# Patient Record
Sex: Female | Born: 1940 | Race: White | Hispanic: No | Smoking: Former smoker
Health system: Southern US, Community
[De-identification: ages and names within clinical notes are randomized; demographics above are authoritative.]

## PROBLEM LIST (undated history)

## (undated) DIAGNOSIS — M1990 Unspecified osteoarthritis, unspecified site: Secondary | ICD-10-CM

## (undated) DIAGNOSIS — I10 Essential (primary) hypertension: Secondary | ICD-10-CM

## (undated) DIAGNOSIS — E039 Hypothyroidism, unspecified: Secondary | ICD-10-CM

## (undated) DIAGNOSIS — J449 Chronic obstructive pulmonary disease, unspecified: Secondary | ICD-10-CM

## (undated) DIAGNOSIS — F32A Depression, unspecified: Secondary | ICD-10-CM

## (undated) DIAGNOSIS — J209 Acute bronchitis, unspecified: Secondary | ICD-10-CM

## (undated) DIAGNOSIS — E785 Hyperlipidemia, unspecified: Secondary | ICD-10-CM

## (undated) DIAGNOSIS — Z9889 Other specified postprocedural states: Secondary | ICD-10-CM

## (undated) DIAGNOSIS — M797 Fibromyalgia: Secondary | ICD-10-CM

## (undated) DIAGNOSIS — T8859XA Other complications of anesthesia, initial encounter: Secondary | ICD-10-CM

## (undated) DIAGNOSIS — K219 Gastro-esophageal reflux disease without esophagitis: Secondary | ICD-10-CM

## (undated) DIAGNOSIS — F329 Major depressive disorder, single episode, unspecified: Secondary | ICD-10-CM

## (undated) DIAGNOSIS — I8390 Asymptomatic varicose veins of unspecified lower extremity: Secondary | ICD-10-CM

## (undated) DIAGNOSIS — R112 Nausea with vomiting, unspecified: Secondary | ICD-10-CM

## (undated) DIAGNOSIS — H9190 Unspecified hearing loss, unspecified ear: Secondary | ICD-10-CM

## (undated) DIAGNOSIS — T4145XA Adverse effect of unspecified anesthetic, initial encounter: Secondary | ICD-10-CM

## (undated) HISTORY — DX: Acute bronchitis, unspecified: J20.9

## (undated) HISTORY — DX: Essential (primary) hypertension: I10

## (undated) HISTORY — DX: Gastro-esophageal reflux disease without esophagitis: K21.9

## (undated) HISTORY — PX: APPENDECTOMY: SHX54

## (undated) HISTORY — DX: Major depressive disorder, single episode, unspecified: F32.9

## (undated) HISTORY — DX: Hyperlipidemia, unspecified: E78.5

## (undated) HISTORY — DX: Depression, unspecified: F32.A

## (undated) HISTORY — PX: VESICOVAGINAL FISTULA CLOSURE W/ TAH: SUR271

## (undated) HISTORY — DX: Fibromyalgia: M79.7

## (undated) HISTORY — PX: NECK SURGERY: SHX720

## (undated) HISTORY — DX: Asymptomatic varicose veins of unspecified lower extremity: I83.90

## (undated) HISTORY — PX: ABDOMINAL HYSTERECTOMY: SHX81

---

## 1978-02-20 HISTORY — PX: BREAST SURGERY: SHX581

## 1978-02-20 HISTORY — PX: MASTECTOMY: SHX3

## 1989-02-20 HISTORY — PX: ROTATOR CUFF REPAIR: SHX139

## 1998-01-27 ENCOUNTER — Encounter: Admission: RE | Admit: 1998-01-27 | Discharge: 1998-01-27 | Payer: Self-pay | Admitting: Internal Medicine

## 1998-05-28 ENCOUNTER — Encounter: Admission: RE | Admit: 1998-05-28 | Discharge: 1998-05-28 | Payer: Self-pay | Admitting: Internal Medicine

## 2000-06-05 ENCOUNTER — Ambulatory Visit (HOSPITAL_COMMUNITY): Admission: RE | Admit: 2000-06-05 | Discharge: 2000-06-05 | Payer: Self-pay | Admitting: Pulmonary Disease

## 2000-10-17 ENCOUNTER — Encounter (HOSPITAL_COMMUNITY): Admission: RE | Admit: 2000-10-17 | Discharge: 2000-11-16 | Payer: Self-pay | Admitting: Orthopedic Surgery

## 2000-12-12 ENCOUNTER — Ambulatory Visit (HOSPITAL_COMMUNITY): Admission: RE | Admit: 2000-12-12 | Discharge: 2000-12-12 | Payer: Self-pay | Admitting: Unknown Physician Specialty

## 2000-12-12 ENCOUNTER — Encounter: Payer: Self-pay | Admitting: Unknown Physician Specialty

## 2000-12-21 ENCOUNTER — Ambulatory Visit (HOSPITAL_COMMUNITY): Admission: RE | Admit: 2000-12-21 | Discharge: 2000-12-21 | Payer: Self-pay | Admitting: Orthopedic Surgery

## 2001-02-26 ENCOUNTER — Encounter (HOSPITAL_COMMUNITY): Admission: RE | Admit: 2001-02-26 | Discharge: 2001-03-28 | Payer: Self-pay | Admitting: Orthopedic Surgery

## 2001-04-10 ENCOUNTER — Encounter (HOSPITAL_COMMUNITY): Admission: RE | Admit: 2001-04-10 | Discharge: 2001-05-10 | Payer: Self-pay | Admitting: Orthopedic Surgery

## 2001-05-13 ENCOUNTER — Encounter (HOSPITAL_COMMUNITY): Admission: RE | Admit: 2001-05-13 | Discharge: 2001-06-12 | Payer: Self-pay | Admitting: Orthopedic Surgery

## 2001-06-03 ENCOUNTER — Ambulatory Visit (HOSPITAL_COMMUNITY): Admission: RE | Admit: 2001-06-03 | Discharge: 2001-06-03 | Payer: Self-pay | Admitting: Orthopedic Surgery

## 2001-06-03 ENCOUNTER — Encounter: Payer: Self-pay | Admitting: Orthopedic Surgery

## 2001-10-02 ENCOUNTER — Encounter: Payer: Self-pay | Admitting: Internal Medicine

## 2001-10-02 ENCOUNTER — Ambulatory Visit (HOSPITAL_COMMUNITY): Admission: RE | Admit: 2001-10-02 | Discharge: 2001-10-02 | Payer: Self-pay | Admitting: Internal Medicine

## 2001-11-18 ENCOUNTER — Encounter: Payer: Self-pay | Admitting: Unknown Physician Specialty

## 2001-11-18 ENCOUNTER — Ambulatory Visit (HOSPITAL_COMMUNITY): Admission: RE | Admit: 2001-11-18 | Discharge: 2001-11-18 | Payer: Self-pay | Admitting: Unknown Physician Specialty

## 2002-01-15 ENCOUNTER — Encounter: Payer: Self-pay | Admitting: Unknown Physician Specialty

## 2002-01-15 ENCOUNTER — Ambulatory Visit (HOSPITAL_COMMUNITY): Admission: RE | Admit: 2002-01-15 | Discharge: 2002-01-15 | Payer: Self-pay | Admitting: Unknown Physician Specialty

## 2002-05-16 ENCOUNTER — Emergency Department (HOSPITAL_COMMUNITY): Admission: EM | Admit: 2002-05-16 | Discharge: 2002-05-17 | Payer: Self-pay | Admitting: *Deleted

## 2002-05-17 ENCOUNTER — Encounter: Payer: Self-pay | Admitting: *Deleted

## 2002-09-09 ENCOUNTER — Ambulatory Visit (HOSPITAL_COMMUNITY): Admission: RE | Admit: 2002-09-09 | Discharge: 2002-09-09 | Payer: Self-pay | Admitting: Internal Medicine

## 2003-02-05 ENCOUNTER — Emergency Department (HOSPITAL_COMMUNITY): Admission: EM | Admit: 2003-02-05 | Discharge: 2003-02-05 | Payer: Self-pay | Admitting: Emergency Medicine

## 2003-06-10 ENCOUNTER — Ambulatory Visit (HOSPITAL_COMMUNITY): Admission: RE | Admit: 2003-06-10 | Discharge: 2003-06-10 | Payer: Self-pay | Admitting: Unknown Physician Specialty

## 2003-06-19 ENCOUNTER — Emergency Department (HOSPITAL_COMMUNITY): Admission: EM | Admit: 2003-06-19 | Discharge: 2003-06-19 | Payer: Self-pay | Admitting: Emergency Medicine

## 2003-07-06 ENCOUNTER — Emergency Department (HOSPITAL_COMMUNITY): Admission: EM | Admit: 2003-07-06 | Discharge: 2003-07-06 | Payer: Self-pay | Admitting: Emergency Medicine

## 2003-07-21 ENCOUNTER — Ambulatory Visit (HOSPITAL_COMMUNITY): Admission: RE | Admit: 2003-07-21 | Discharge: 2003-07-21 | Payer: Self-pay | Admitting: Family Medicine

## 2003-07-27 ENCOUNTER — Ambulatory Visit (HOSPITAL_COMMUNITY): Admission: RE | Admit: 2003-07-27 | Discharge: 2003-07-27 | Payer: Self-pay | Admitting: Family Medicine

## 2003-08-21 ENCOUNTER — Ambulatory Visit (HOSPITAL_COMMUNITY): Admission: RE | Admit: 2003-08-21 | Discharge: 2003-08-21 | Payer: Self-pay | Admitting: Family Medicine

## 2003-08-26 ENCOUNTER — Ambulatory Visit (HOSPITAL_COMMUNITY): Admission: RE | Admit: 2003-08-26 | Discharge: 2003-08-26 | Payer: Self-pay | Admitting: Family Medicine

## 2003-09-15 ENCOUNTER — Ambulatory Visit (HOSPITAL_COMMUNITY): Admission: RE | Admit: 2003-09-15 | Discharge: 2003-09-15 | Payer: Self-pay | Admitting: Family Medicine

## 2003-11-30 ENCOUNTER — Ambulatory Visit (HOSPITAL_COMMUNITY): Admission: RE | Admit: 2003-11-30 | Discharge: 2003-11-30 | Payer: Self-pay | Admitting: Internal Medicine

## 2003-11-30 HISTORY — PX: ESOPHAGOGASTRODUODENOSCOPY: SHX1529

## 2003-12-14 ENCOUNTER — Ambulatory Visit (HOSPITAL_COMMUNITY): Admission: RE | Admit: 2003-12-14 | Discharge: 2003-12-14 | Payer: Self-pay | Admitting: Family Medicine

## 2003-12-28 ENCOUNTER — Ambulatory Visit: Payer: Self-pay | Admitting: Urgent Care

## 2004-01-18 ENCOUNTER — Ambulatory Visit: Payer: Self-pay | Admitting: Family Medicine

## 2004-02-08 ENCOUNTER — Ambulatory Visit: Payer: Self-pay | Admitting: Family Medicine

## 2004-03-14 ENCOUNTER — Ambulatory Visit: Payer: Self-pay | Admitting: Family Medicine

## 2004-07-12 ENCOUNTER — Ambulatory Visit: Payer: Self-pay | Admitting: Family Medicine

## 2004-08-24 ENCOUNTER — Ambulatory Visit: Payer: Self-pay | Admitting: Family Medicine

## 2004-10-11 ENCOUNTER — Ambulatory Visit: Payer: Self-pay | Admitting: Family Medicine

## 2004-10-12 ENCOUNTER — Ambulatory Visit (HOSPITAL_COMMUNITY): Admission: RE | Admit: 2004-10-12 | Discharge: 2004-10-12 | Payer: Self-pay | Admitting: Family Medicine

## 2004-11-25 ENCOUNTER — Ambulatory Visit: Payer: Self-pay | Admitting: Family Medicine

## 2004-12-06 ENCOUNTER — Ambulatory Visit: Payer: Self-pay | Admitting: Family Medicine

## 2004-12-12 ENCOUNTER — Ambulatory Visit (HOSPITAL_COMMUNITY): Admission: RE | Admit: 2004-12-12 | Discharge: 2004-12-12 | Payer: Self-pay | Admitting: Family Medicine

## 2005-01-24 ENCOUNTER — Ambulatory Visit: Payer: Self-pay | Admitting: Family Medicine

## 2005-02-14 ENCOUNTER — Ambulatory Visit: Payer: Self-pay | Admitting: Family Medicine

## 2005-03-01 ENCOUNTER — Ambulatory Visit: Payer: Self-pay | Admitting: Internal Medicine

## 2005-03-03 ENCOUNTER — Ambulatory Visit (HOSPITAL_COMMUNITY): Admission: RE | Admit: 2005-03-03 | Discharge: 2005-03-03 | Payer: Self-pay | Admitting: Internal Medicine

## 2005-04-17 ENCOUNTER — Ambulatory Visit: Payer: Self-pay | Admitting: Internal Medicine

## 2005-05-01 ENCOUNTER — Ambulatory Visit: Payer: Self-pay | Admitting: Family Medicine

## 2005-06-13 ENCOUNTER — Ambulatory Visit: Payer: Self-pay | Admitting: Family Medicine

## 2005-06-27 ENCOUNTER — Ambulatory Visit (HOSPITAL_COMMUNITY): Admission: RE | Admit: 2005-06-27 | Discharge: 2005-06-27 | Payer: Self-pay | Admitting: Family Medicine

## 2005-06-27 ENCOUNTER — Ambulatory Visit: Payer: Self-pay | Admitting: Family Medicine

## 2005-07-25 ENCOUNTER — Other Ambulatory Visit: Admission: RE | Admit: 2005-07-25 | Discharge: 2005-07-25 | Payer: Self-pay | Admitting: Family Medicine

## 2005-07-25 ENCOUNTER — Encounter: Payer: Self-pay | Admitting: Family Medicine

## 2005-07-25 ENCOUNTER — Ambulatory Visit: Payer: Self-pay | Admitting: Family Medicine

## 2005-07-25 LAB — CONVERTED CEMR LAB: Pap Smear: NORMAL

## 2005-07-26 ENCOUNTER — Ambulatory Visit (HOSPITAL_COMMUNITY): Admission: RE | Admit: 2005-07-26 | Discharge: 2005-07-26 | Payer: Self-pay | Admitting: Family Medicine

## 2005-08-07 ENCOUNTER — Encounter (HOSPITAL_COMMUNITY): Admission: RE | Admit: 2005-08-07 | Discharge: 2005-09-06 | Payer: Self-pay | Admitting: Family Medicine

## 2005-09-19 ENCOUNTER — Ambulatory Visit: Payer: Self-pay | Admitting: Family Medicine

## 2005-09-25 ENCOUNTER — Ambulatory Visit (HOSPITAL_COMMUNITY): Admission: RE | Admit: 2005-09-25 | Discharge: 2005-09-25 | Payer: Self-pay | Admitting: Family Medicine

## 2005-10-09 ENCOUNTER — Ambulatory Visit: Payer: Self-pay | Admitting: Internal Medicine

## 2005-10-25 ENCOUNTER — Ambulatory Visit: Payer: Self-pay | Admitting: Family Medicine

## 2005-11-14 ENCOUNTER — Ambulatory Visit (HOSPITAL_COMMUNITY): Admission: RE | Admit: 2005-11-14 | Discharge: 2005-11-14 | Payer: Self-pay | Admitting: Family Medicine

## 2005-11-14 ENCOUNTER — Ambulatory Visit: Payer: Self-pay | Admitting: Family Medicine

## 2005-11-27 ENCOUNTER — Ambulatory Visit: Payer: Self-pay | Admitting: Internal Medicine

## 2005-12-04 ENCOUNTER — Ambulatory Visit: Payer: Self-pay | Admitting: Family Medicine

## 2005-12-06 ENCOUNTER — Ambulatory Visit (HOSPITAL_COMMUNITY): Admission: RE | Admit: 2005-12-06 | Discharge: 2005-12-06 | Payer: Self-pay | Admitting: Family Medicine

## 2006-03-06 ENCOUNTER — Encounter: Payer: Self-pay | Admitting: Family Medicine

## 2006-03-06 DIAGNOSIS — J309 Allergic rhinitis, unspecified: Secondary | ICD-10-CM | POA: Insufficient documentation

## 2006-03-06 DIAGNOSIS — J209 Acute bronchitis, unspecified: Secondary | ICD-10-CM | POA: Insufficient documentation

## 2006-03-06 DIAGNOSIS — E782 Mixed hyperlipidemia: Secondary | ICD-10-CM | POA: Insufficient documentation

## 2006-03-06 DIAGNOSIS — F329 Major depressive disorder, single episode, unspecified: Secondary | ICD-10-CM | POA: Insufficient documentation

## 2006-03-06 DIAGNOSIS — E785 Hyperlipidemia, unspecified: Secondary | ICD-10-CM | POA: Insufficient documentation

## 2006-03-06 DIAGNOSIS — I10 Essential (primary) hypertension: Secondary | ICD-10-CM | POA: Insufficient documentation

## 2006-03-06 DIAGNOSIS — H8109 Meniere's disease, unspecified ear: Secondary | ICD-10-CM | POA: Insufficient documentation

## 2006-03-06 DIAGNOSIS — IMO0001 Reserved for inherently not codable concepts without codable children: Secondary | ICD-10-CM | POA: Insufficient documentation

## 2006-03-06 DIAGNOSIS — K5900 Constipation, unspecified: Secondary | ICD-10-CM | POA: Insufficient documentation

## 2006-03-06 DIAGNOSIS — M810 Age-related osteoporosis without current pathological fracture: Secondary | ICD-10-CM | POA: Insufficient documentation

## 2006-03-06 DIAGNOSIS — K219 Gastro-esophageal reflux disease without esophagitis: Secondary | ICD-10-CM | POA: Insufficient documentation

## 2006-03-28 ENCOUNTER — Ambulatory Visit: Payer: Self-pay | Admitting: Family Medicine

## 2006-04-02 ENCOUNTER — Ambulatory Visit: Payer: Self-pay | Admitting: Family Medicine

## 2006-04-09 ENCOUNTER — Ambulatory Visit: Payer: Self-pay | Admitting: Family Medicine

## 2006-04-30 ENCOUNTER — Encounter: Payer: Self-pay | Admitting: Family Medicine

## 2006-04-30 LAB — CONVERTED CEMR LAB
ALT: 21 units/L (ref 0–35)
AST: 21 units/L (ref 0–37)
Albumin: 3.9 g/dL (ref 3.5–5.2)
Alkaline Phosphatase: 61 units/L (ref 39–117)
Bilirubin, Direct: 0.1 mg/dL (ref 0.0–0.3)
Cholesterol: 212 mg/dL — ABNORMAL HIGH (ref 0–200)
HDL: 54 mg/dL (ref >39)
Indirect Bilirubin: 0.3 mg/dL (ref 0.0–0.9)
LDL Cholesterol: 137 mg/dL — ABNORMAL HIGH (ref 0–99)
Total Bilirubin: 0.4 mg/dL (ref 0.3–1.2)
Total CHOL/HDL Ratio: 3.9
Total Protein: 7.2 g/dL (ref 6.0–8.3)
Triglycerides: 105 mg/dL (ref <150)
VLDL: 21 mg/dL (ref 0–40)

## 2006-05-07 ENCOUNTER — Ambulatory Visit: Payer: Self-pay | Admitting: Family Medicine

## 2006-05-28 ENCOUNTER — Encounter: Admission: RE | Admit: 2006-05-28 | Discharge: 2006-05-28 | Payer: Self-pay | Admitting: Family Medicine

## 2006-09-10 ENCOUNTER — Ambulatory Visit: Payer: Self-pay | Admitting: Family Medicine

## 2006-09-10 LAB — CONVERTED CEMR LAB
ALT: 33 units/L (ref 0–35)
AST: 29 units/L (ref 0–37)
Albumin: 4.4 g/dL (ref 3.5–5.2)
Alkaline Phosphatase: 77 units/L (ref 39–117)
BUN: 14 mg/dL (ref 6–23)
Bilirubin, Direct: 0.1 mg/dL (ref 0.0–0.3)
CO2: 24 meq/L (ref 19–32)
Calcium: 9.3 mg/dL (ref 8.4–10.5)
Chloride: 104 meq/L (ref 96–112)
Creatinine, Ser: 0.77 mg/dL (ref 0.40–1.20)
Glucose, Bld: 99 mg/dL (ref 70–99)
Helicobacter Pylori Antibody-IgG: 1 — ABNORMAL HIGH
Indirect Bilirubin: 0.3 mg/dL (ref 0.0–0.9)
Potassium: 3.6 meq/L (ref 3.5–5.3)
Sodium: 143 meq/L (ref 135–145)
Total Bilirubin: 0.4 mg/dL (ref 0.3–1.2)
Total Protein: 7.7 g/dL (ref 6.0–8.3)

## 2006-09-13 ENCOUNTER — Ambulatory Visit (HOSPITAL_COMMUNITY): Admission: RE | Admit: 2006-09-13 | Discharge: 2006-09-13 | Payer: Self-pay | Admitting: Family Medicine

## 2006-09-25 ENCOUNTER — Encounter (HOSPITAL_COMMUNITY): Admission: RE | Admit: 2006-09-25 | Discharge: 2006-10-25 | Payer: Self-pay | Admitting: Family Medicine

## 2006-10-31 ENCOUNTER — Ambulatory Visit: Payer: Self-pay | Admitting: Family Medicine

## 2006-12-19 ENCOUNTER — Encounter: Payer: Self-pay | Admitting: Family Medicine

## 2006-12-19 LAB — CONVERTED CEMR LAB
ALT: 22 units/L (ref 0–35)
AST: 24 units/L (ref 0–37)
Albumin: 4.3 g/dL (ref 3.5–5.2)
Alkaline Phosphatase: 58 units/L (ref 39–117)
BUN: 18 mg/dL (ref 6–23)
Basophils Absolute: 0 10*3/uL (ref 0.0–0.1)
Basophils Relative: 0 % (ref 0–1)
Bilirubin, Direct: 0.1 mg/dL (ref 0.0–0.3)
CO2: 26 meq/L (ref 19–32)
Calcium: 9.7 mg/dL (ref 8.4–10.5)
Chloride: 102 meq/L (ref 96–112)
Cholesterol: 232 mg/dL — ABNORMAL HIGH (ref 0–200)
Creatinine, Ser: 0.87 mg/dL (ref 0.40–1.20)
Eosinophils Absolute: 0.1 10*3/uL (ref 0.0–0.7)
Eosinophils Relative: 1 % (ref 0–5)
Glucose, Bld: 103 mg/dL — ABNORMAL HIGH (ref 70–99)
HCT: 42.6 % (ref 36.0–46.0)
HDL: 47 mg/dL (ref >39)
Hemoglobin: 13.9 g/dL (ref 12.0–15.0)
Indirect Bilirubin: 0.4 mg/dL (ref 0.0–0.9)
LDL Cholesterol: 150 mg/dL — ABNORMAL HIGH (ref 0–99)
Lymphocytes Relative: 43 % (ref 12–46)
Lymphs Abs: 3.2 10*3/uL (ref 0.7–3.3)
MCHC: 32.6 g/dL (ref 30.0–36.0)
MCV: 92.2 fL (ref 78.0–100.0)
Monocytes Absolute: 0.7 10*3/uL (ref 0.2–0.7)
Monocytes Relative: 9 % (ref 3–11)
Neutro Abs: 3.4 10*3/uL (ref 1.7–7.7)
Neutrophils Relative %: 47 % (ref 43–77)
Platelets: 238 10*3/uL (ref 150–400)
Potassium: 4.1 meq/L (ref 3.5–5.3)
RBC: 4.62 M/uL (ref 3.87–5.11)
RDW: 13.8 % (ref 11.5–14.0)
Sodium: 138 meq/L (ref 135–145)
Total Bilirubin: 0.5 mg/dL (ref 0.3–1.2)
Total CHOL/HDL Ratio: 4.9
Total Protein: 7.6 g/dL (ref 6.0–8.3)
Triglycerides: 173 mg/dL — ABNORMAL HIGH (ref <150)
VLDL: 35 mg/dL (ref 0–40)
WBC: 7.3 10*3/uL (ref 4.0–10.5)

## 2007-01-02 ENCOUNTER — Ambulatory Visit: Payer: Self-pay | Admitting: Family Medicine

## 2007-02-08 ENCOUNTER — Encounter: Payer: Self-pay | Admitting: Family Medicine

## 2007-02-25 ENCOUNTER — Ambulatory Visit: Payer: Self-pay | Admitting: Family Medicine

## 2007-03-01 ENCOUNTER — Encounter: Payer: Self-pay | Admitting: Family Medicine

## 2007-04-20 ENCOUNTER — Encounter: Payer: Self-pay | Admitting: Family Medicine

## 2007-04-20 LAB — CONVERTED CEMR LAB
ALT: 36 units/L — ABNORMAL HIGH (ref 0–35)
AST: 30 units/L (ref 0–37)
Albumin: 4.1 g/dL (ref 3.5–5.2)
Alkaline Phosphatase: 61 units/L (ref 39–117)
Bilirubin, Direct: 0.1 mg/dL (ref 0.0–0.3)
Cholesterol: 175 mg/dL (ref 0–200)
HDL: 52 mg/dL (ref >39)
Indirect Bilirubin: 0.3 mg/dL (ref 0.0–0.9)
LDL Cholesterol: 100 mg/dL — ABNORMAL HIGH (ref 0–99)
Total Bilirubin: 0.4 mg/dL (ref 0.3–1.2)
Total CHOL/HDL Ratio: 3.4
Total Protein: 7.5 g/dL (ref 6.0–8.3)
Triglycerides: 114 mg/dL (ref <150)
VLDL: 23 mg/dL (ref 0–40)

## 2007-04-24 ENCOUNTER — Ambulatory Visit: Payer: Self-pay | Admitting: Family Medicine

## 2007-06-09 ENCOUNTER — Emergency Department (HOSPITAL_COMMUNITY): Admission: EM | Admit: 2007-06-09 | Discharge: 2007-06-09 | Payer: Self-pay | Admitting: Emergency Medicine

## 2007-06-14 ENCOUNTER — Emergency Department (HOSPITAL_COMMUNITY): Admission: EM | Admit: 2007-06-14 | Discharge: 2007-06-14 | Payer: Self-pay | Admitting: Emergency Medicine

## 2007-07-04 ENCOUNTER — Ambulatory Visit (HOSPITAL_COMMUNITY): Admission: RE | Admit: 2007-07-04 | Discharge: 2007-07-04 | Payer: Self-pay | Admitting: Family Medicine

## 2007-07-30 ENCOUNTER — Encounter: Payer: Self-pay | Admitting: Family Medicine

## 2007-07-30 ENCOUNTER — Ambulatory Visit: Payer: Self-pay | Admitting: Family Medicine

## 2007-09-24 ENCOUNTER — Encounter: Payer: Self-pay | Admitting: Family Medicine

## 2007-10-30 ENCOUNTER — Encounter: Payer: Self-pay | Admitting: Family Medicine

## 2007-10-30 LAB — CONVERTED CEMR LAB
ALT: 29 units/L (ref 0–35)
AST: 27 units/L (ref 0–37)
Albumin: 4.1 g/dL (ref 3.5–5.2)
Alkaline Phosphatase: 65 units/L (ref 39–117)
BUN: 15 mg/dL (ref 6–23)
Bilirubin, Direct: <0.1 mg/dL (ref 0.0–0.3)
CO2: 24 meq/L (ref 19–32)
Calcium: 9.6 mg/dL (ref 8.4–10.5)
Chloride: 104 meq/L (ref 96–112)
Cholesterol: 244 mg/dL — ABNORMAL HIGH (ref 0–200)
Creatinine, Ser: 0.76 mg/dL (ref 0.40–1.20)
Glucose, Bld: 86 mg/dL (ref 70–99)
HDL: 40 mg/dL (ref >39)
Potassium: 4.1 meq/L (ref 3.5–5.3)
Sodium: 141 meq/L (ref 135–145)
Total Bilirubin: 0.4 mg/dL (ref 0.3–1.2)
Total CHOL/HDL Ratio: 6.1
Total Protein: 7.6 g/dL (ref 6.0–8.3)
Triglycerides: 462 mg/dL — ABNORMAL HIGH (ref <150)

## 2007-10-31 ENCOUNTER — Ambulatory Visit: Payer: Self-pay | Admitting: Family Medicine

## 2007-10-31 LAB — CONVERTED CEMR LAB: Direct LDL: 141 mg/dL — ABNORMAL HIGH

## 2007-11-15 ENCOUNTER — Encounter: Payer: Self-pay | Admitting: Family Medicine

## 2008-01-06 ENCOUNTER — Encounter: Payer: Self-pay | Admitting: Family Medicine

## 2008-01-30 ENCOUNTER — Encounter: Payer: Self-pay | Admitting: Family Medicine

## 2008-01-30 LAB — CONVERTED CEMR LAB
ALT: 27 units/L (ref 0–35)
AST: 27 units/L (ref 0–37)
Albumin: 4.3 g/dL (ref 3.5–5.2)
Alkaline Phosphatase: 68 units/L (ref 39–117)
Bilirubin, Direct: 0.1 mg/dL (ref 0.0–0.3)
Cholesterol: 204 mg/dL — ABNORMAL HIGH (ref 0–200)
HDL: 52 mg/dL (ref >39)
Indirect Bilirubin: 0.2 mg/dL (ref 0.0–0.9)
LDL Cholesterol: 104 mg/dL — ABNORMAL HIGH (ref 0–99)
Total Bilirubin: 0.3 mg/dL (ref 0.3–1.2)
Total CHOL/HDL Ratio: 3.9
Total Protein: 7.5 g/dL (ref 6.0–8.3)
Triglycerides: 241 mg/dL — ABNORMAL HIGH (ref <150)
VLDL: 48 mg/dL — ABNORMAL HIGH (ref 0–40)

## 2008-02-05 ENCOUNTER — Encounter: Payer: Self-pay | Admitting: Family Medicine

## 2008-02-05 ENCOUNTER — Other Ambulatory Visit: Admission: RE | Admit: 2008-02-05 | Discharge: 2008-02-05 | Payer: Self-pay | Admitting: Family Medicine

## 2008-02-05 ENCOUNTER — Ambulatory Visit: Payer: Self-pay | Admitting: Family Medicine

## 2008-02-05 DIAGNOSIS — R5383 Other fatigue: Secondary | ICD-10-CM | POA: Insufficient documentation

## 2008-02-05 DIAGNOSIS — M25559 Pain in unspecified hip: Secondary | ICD-10-CM | POA: Insufficient documentation

## 2008-02-05 DIAGNOSIS — N3000 Acute cystitis without hematuria: Secondary | ICD-10-CM | POA: Insufficient documentation

## 2008-02-05 HISTORY — DX: Other fatigue: R53.83

## 2008-02-05 LAB — CONVERTED CEMR LAB
Bilirubin Urine: NEGATIVE
Blood in Urine, dipstick: NEGATIVE
Glucose, Urine, Semiquant: NEGATIVE
Nitrite: NEGATIVE
OCCULT 1: NEGATIVE
Protein, U semiquant: 30
Specific Gravity, Urine: >=1.03
Urobilinogen, UA: 0.2
WBC Urine, dipstick: NEGATIVE
pH: 5.5

## 2008-02-07 DIAGNOSIS — N644 Mastodynia: Secondary | ICD-10-CM | POA: Insufficient documentation

## 2008-02-21 HISTORY — PX: OTHER SURGICAL HISTORY: SHX169

## 2008-03-02 ENCOUNTER — Telehealth: Payer: Self-pay | Admitting: Family Medicine

## 2008-03-03 ENCOUNTER — Ambulatory Visit: Payer: Self-pay | Admitting: Family Medicine

## 2008-03-03 ENCOUNTER — Telehealth: Payer: Self-pay | Admitting: Family Medicine

## 2008-03-12 ENCOUNTER — Encounter: Payer: Self-pay | Admitting: Family Medicine

## 2008-03-23 ENCOUNTER — Encounter: Payer: Self-pay | Admitting: Family Medicine

## 2008-03-24 ENCOUNTER — Ambulatory Visit: Payer: Self-pay | Admitting: Family Medicine

## 2008-03-24 DIAGNOSIS — R1900 Intra-abdominal and pelvic swelling, mass and lump, unspecified site: Secondary | ICD-10-CM | POA: Insufficient documentation

## 2008-03-24 DIAGNOSIS — J0100 Acute maxillary sinusitis, unspecified: Secondary | ICD-10-CM | POA: Insufficient documentation

## 2008-03-24 DIAGNOSIS — M5442 Lumbago with sciatica, left side: Secondary | ICD-10-CM | POA: Insufficient documentation

## 2008-03-24 DIAGNOSIS — M5431 Sciatica, right side: Secondary | ICD-10-CM

## 2008-03-25 LAB — CONVERTED CEMR LAB
Basophils Absolute: 0 10*3/uL (ref 0.0–0.1)
Basophils Relative: 0 % (ref 0–1)
Eosinophils Absolute: 0.1 10*3/uL (ref 0.0–0.7)
Eosinophils Relative: 1 % (ref 0–5)
HCT: 39.4 % (ref 36.0–46.0)
Hemoglobin: 13 g/dL (ref 12.0–15.0)
Lymphocytes Relative: 25 % (ref 12–46)
Lymphs Abs: 2.6 10*3/uL (ref 0.7–4.0)
MCHC: 33 g/dL (ref 30.0–36.0)
MCV: 90 fL (ref 78.0–100.0)
Monocytes Absolute: 0.8 10*3/uL (ref 0.1–1.0)
Monocytes Relative: 7 % (ref 3–12)
Neutro Abs: 7 10*3/uL (ref 1.7–7.7)
Neutrophils Relative %: 67 % (ref 43–77)
Platelets: 275 10*3/uL (ref 150–400)
RBC: 4.38 M/uL (ref 3.87–5.11)
RDW: 13.7 % (ref 11.5–15.5)
TSH: 2.357 microintl units/mL (ref 0.350–4.50)
WBC: 10.5 10*3/uL (ref 4.0–10.5)

## 2008-03-26 ENCOUNTER — Ambulatory Visit (HOSPITAL_COMMUNITY): Admission: RE | Admit: 2008-03-26 | Discharge: 2008-03-26 | Payer: Self-pay | Admitting: Family Medicine

## 2008-03-26 ENCOUNTER — Encounter: Payer: Self-pay | Admitting: Family Medicine

## 2008-03-27 ENCOUNTER — Ambulatory Visit (HOSPITAL_COMMUNITY): Admission: RE | Admit: 2008-03-27 | Discharge: 2008-03-27 | Payer: Self-pay | Admitting: Family Medicine

## 2008-03-28 LAB — CONVERTED CEMR LAB
Creatinine 24 HR UR: 285 mg/24hr — ABNORMAL LOW (ref 700–1800)
Creatinine, Urine: 142.7 mg/dL

## 2008-03-31 ENCOUNTER — Telehealth: Payer: Self-pay | Admitting: Family Medicine

## 2008-04-08 ENCOUNTER — Telehealth: Payer: Self-pay | Admitting: Orthopedic Surgery

## 2008-04-13 ENCOUNTER — Telehealth: Payer: Self-pay | Admitting: Family Medicine

## 2008-04-14 ENCOUNTER — Telehealth: Payer: Self-pay | Admitting: Family Medicine

## 2008-04-17 ENCOUNTER — Encounter: Payer: Self-pay | Admitting: Family Medicine

## 2008-04-24 ENCOUNTER — Encounter: Payer: Self-pay | Admitting: Family Medicine

## 2008-05-13 ENCOUNTER — Ambulatory Visit: Payer: Self-pay | Admitting: Family Medicine

## 2008-06-24 ENCOUNTER — Encounter: Payer: Self-pay | Admitting: Family Medicine

## 2008-07-31 ENCOUNTER — Ambulatory Visit (HOSPITAL_COMMUNITY): Admission: RE | Admit: 2008-07-31 | Discharge: 2008-07-31 | Payer: Self-pay | Admitting: Family Medicine

## 2008-07-31 ENCOUNTER — Ambulatory Visit: Payer: Self-pay | Admitting: Family Medicine

## 2008-07-31 DIAGNOSIS — M79609 Pain in unspecified limb: Secondary | ICD-10-CM | POA: Insufficient documentation

## 2008-08-03 LAB — CONVERTED CEMR LAB
ALT: 24 units/L (ref 0–35)
AST: 23 units/L (ref 0–37)
Albumin: 4.1 g/dL (ref 3.5–5.2)
Alkaline Phosphatase: 52 units/L (ref 39–117)
BUN: 15 mg/dL (ref 6–23)
Basophils Absolute: 0 10*3/uL (ref 0.0–0.1)
Basophils Relative: 0 % (ref 0–1)
Bilirubin, Direct: 0.1 mg/dL (ref 0.0–0.3)
CO2: 24 meq/L (ref 19–32)
Calcium: 9.2 mg/dL (ref 8.4–10.5)
Chloride: 104 meq/L (ref 96–112)
Cholesterol: 208 mg/dL — ABNORMAL HIGH (ref 0–200)
Creatinine, Ser: 0.88 mg/dL (ref 0.40–1.20)
Eosinophils Absolute: 0.1 10*3/uL (ref 0.0–0.7)
Eosinophils Relative: 1 % (ref 0–5)
Glucose, Bld: 108 mg/dL — ABNORMAL HIGH (ref 70–99)
HCT: 41.2 % (ref 36.0–46.0)
HDL: 50 mg/dL (ref >39)
Hemoglobin: 13.9 g/dL (ref 12.0–15.0)
Indirect Bilirubin: 0.2 mg/dL (ref 0.0–0.9)
LDL Cholesterol: 116 mg/dL — ABNORMAL HIGH (ref 0–99)
Lymphocytes Relative: 48 % — ABNORMAL HIGH (ref 12–46)
Lymphs Abs: 3.3 10*3/uL (ref 0.7–4.0)
MCHC: 33.7 g/dL (ref 30.0–36.0)
MCV: 93.2 fL (ref 78.0–100.0)
Monocytes Absolute: 0.7 10*3/uL (ref 0.1–1.0)
Monocytes Relative: 10 % (ref 3–12)
Neutro Abs: 2.8 10*3/uL (ref 1.7–7.7)
Neutrophils Relative %: 41 % — ABNORMAL LOW (ref 43–77)
Platelets: 270 10*3/uL (ref 150–400)
Potassium: 4.3 meq/L (ref 3.5–5.3)
RBC: 4.42 M/uL (ref 3.87–5.11)
RDW: 13.8 % (ref 11.5–15.5)
Sodium: 142 meq/L (ref 135–145)
Total Bilirubin: 0.3 mg/dL (ref 0.3–1.2)
Total CHOL/HDL Ratio: 4.2
Total Protein: 7.5 g/dL (ref 6.0–8.3)
Triglycerides: 209 mg/dL — ABNORMAL HIGH (ref <150)
VLDL: 42 mg/dL — ABNORMAL HIGH (ref 0–40)
Vitamin B-12: 271 pg/mL (ref 211–911)
WBC: 6.8 10*3/uL (ref 4.0–10.5)

## 2008-08-08 DIAGNOSIS — B009 Herpesviral infection, unspecified: Secondary | ICD-10-CM | POA: Insufficient documentation

## 2008-08-17 ENCOUNTER — Telehealth: Payer: Self-pay | Admitting: Family Medicine

## 2008-08-28 ENCOUNTER — Encounter: Payer: Self-pay | Admitting: Family Medicine

## 2008-09-11 ENCOUNTER — Encounter: Payer: Self-pay | Admitting: Family Medicine

## 2008-10-02 ENCOUNTER — Encounter: Payer: Self-pay | Admitting: Family Medicine

## 2008-11-19 ENCOUNTER — Ambulatory Visit: Payer: Self-pay | Admitting: Family Medicine

## 2008-11-19 LAB — CONVERTED CEMR LAB: Hgb A1c MFr Bld: 6.4 %

## 2008-11-22 DIAGNOSIS — E663 Overweight: Secondary | ICD-10-CM | POA: Insufficient documentation

## 2008-11-22 DIAGNOSIS — H6690 Otitis media, unspecified, unspecified ear: Secondary | ICD-10-CM | POA: Insufficient documentation

## 2008-11-22 DIAGNOSIS — E739 Lactose intolerance, unspecified: Secondary | ICD-10-CM | POA: Insufficient documentation

## 2008-11-23 ENCOUNTER — Encounter: Payer: Self-pay | Admitting: Family Medicine

## 2008-12-01 ENCOUNTER — Ambulatory Visit (HOSPITAL_COMMUNITY): Admission: RE | Admit: 2008-12-01 | Discharge: 2008-12-01 | Payer: Self-pay | Admitting: Family Medicine

## 2009-01-17 ENCOUNTER — Emergency Department (HOSPITAL_COMMUNITY): Admission: EM | Admit: 2009-01-17 | Discharge: 2009-01-17 | Payer: Self-pay | Admitting: Emergency Medicine

## 2009-02-20 HISTORY — PX: CATARACT EXTRACTION, BILATERAL: SHX1313

## 2009-05-03 ENCOUNTER — Telehealth: Payer: Self-pay | Admitting: Family Medicine

## 2009-05-06 ENCOUNTER — Ambulatory Visit: Payer: Self-pay | Admitting: Family Medicine

## 2009-05-06 DIAGNOSIS — M25512 Pain in left shoulder: Secondary | ICD-10-CM

## 2009-05-06 HISTORY — DX: Pain in left shoulder: M25.512

## 2009-05-26 ENCOUNTER — Telehealth: Payer: Self-pay | Admitting: Family Medicine

## 2009-07-05 ENCOUNTER — Encounter: Payer: Self-pay | Admitting: Family Medicine

## 2009-07-05 LAB — CONVERTED CEMR LAB
ALT: 21 units/L (ref 0–35)
AST: 22 units/L (ref 0–37)
Albumin: 4.2 g/dL (ref 3.5–5.2)
Alkaline Phosphatase: 56 units/L (ref 39–117)
BUN: 23 mg/dL (ref 6–23)
CO2: 25 meq/L (ref 19–32)
Calcium: 9.4 mg/dL (ref 8.4–10.5)
Chloride: 103 meq/L (ref 96–112)
Cholesterol: 220 mg/dL — ABNORMAL HIGH (ref 0–200)
Creatinine, Ser: 0.92 mg/dL (ref 0.40–1.20)
Glucose, Bld: 89 mg/dL (ref 70–99)
HCT: 42.5 % (ref 36.0–46.0)
HDL: 49 mg/dL (ref >39)
Hemoglobin: 13.5 g/dL (ref 12.0–15.0)
LDL Cholesterol: 128 mg/dL — ABNORMAL HIGH (ref 0–99)
MCHC: 31.8 g/dL (ref 30.0–36.0)
MCV: 95.1 fL (ref 78.0–100.0)
Platelets: 260 10*3/uL (ref 150–400)
Potassium: 4.4 meq/L (ref 3.5–5.3)
RBC: 4.47 M/uL (ref 3.87–5.11)
RDW: 14.3 % (ref 11.5–15.5)
Sodium: 138 meq/L (ref 135–145)
TSH: 4.187 microintl units/mL (ref 0.350–4.500)
Total Bilirubin: 0.4 mg/dL (ref 0.3–1.2)
Total CHOL/HDL Ratio: 4.5
Total Protein: 7 g/dL (ref 6.0–8.3)
Triglycerides: 217 mg/dL — ABNORMAL HIGH (ref <150)
VLDL: 43 mg/dL — ABNORMAL HIGH (ref 0–40)
Vit D, 25-Hydroxy: 37 ng/mL (ref 30–89)
WBC: 6.3 10*3/uL (ref 4.0–10.5)

## 2009-07-08 ENCOUNTER — Ambulatory Visit: Payer: Self-pay | Admitting: Family Medicine

## 2009-07-08 DIAGNOSIS — M549 Dorsalgia, unspecified: Secondary | ICD-10-CM | POA: Insufficient documentation

## 2009-07-08 DIAGNOSIS — H547 Unspecified visual loss: Secondary | ICD-10-CM | POA: Insufficient documentation

## 2009-08-05 ENCOUNTER — Ambulatory Visit (HOSPITAL_COMMUNITY): Admission: RE | Admit: 2009-08-05 | Discharge: 2009-08-05 | Payer: Self-pay | Admitting: Family Medicine

## 2009-08-16 ENCOUNTER — Telehealth: Payer: Self-pay | Admitting: Family Medicine

## 2009-10-22 ENCOUNTER — Telehealth: Payer: Self-pay | Admitting: Family Medicine

## 2009-10-22 LAB — CONVERTED CEMR LAB
ALT: 28 units/L (ref 0–35)
AST: 27 units/L (ref 0–37)
Albumin: 4.1 g/dL (ref 3.5–5.2)
Alkaline Phosphatase: 57 units/L (ref 39–117)
BUN: 11 mg/dL (ref 6–23)
Bilirubin, Direct: 0.1 mg/dL (ref 0.0–0.3)
CO2: 27 meq/L (ref 19–32)
Calcium: 9.2 mg/dL (ref 8.4–10.5)
Chloride: 105 meq/L (ref 96–112)
Cholesterol: 224 mg/dL — ABNORMAL HIGH (ref 0–200)
Creatinine, Ser: 0.75 mg/dL (ref 0.40–1.20)
Glucose, Bld: 103 mg/dL — ABNORMAL HIGH (ref 70–99)
HDL: 48 mg/dL (ref >39)
Indirect Bilirubin: 0.2 mg/dL (ref 0.0–0.9)
LDL Cholesterol: 139 mg/dL — ABNORMAL HIGH (ref 0–99)
Potassium: 4.2 meq/L (ref 3.5–5.3)
Sodium: 142 meq/L (ref 135–145)
TSH: 2.542 microintl units/mL (ref 0.350–4.500)
Total Bilirubin: 0.3 mg/dL (ref 0.3–1.2)
Total CHOL/HDL Ratio: 4.7
Total Protein: 6.9 g/dL (ref 6.0–8.3)
Triglycerides: 185 mg/dL — ABNORMAL HIGH (ref <150)
VLDL: 37 mg/dL (ref 0–40)
Vit D, 25-Hydroxy: 37 ng/mL (ref 30–89)

## 2009-10-26 ENCOUNTER — Ambulatory Visit: Payer: Self-pay | Admitting: Family Medicine

## 2009-10-26 DIAGNOSIS — R7303 Prediabetes: Secondary | ICD-10-CM | POA: Insufficient documentation

## 2009-10-26 LAB — CONVERTED CEMR LAB: OCCULT 1: NEGATIVE

## 2009-10-27 LAB — CONVERTED CEMR LAB: Hgb A1c MFr Bld: 6.2 % — ABNORMAL HIGH (ref <5.7)

## 2009-12-06 ENCOUNTER — Ambulatory Visit (HOSPITAL_COMMUNITY): Admission: RE | Admit: 2009-12-06 | Discharge: 2009-12-06 | Payer: Self-pay | Admitting: Family Medicine

## 2009-12-28 ENCOUNTER — Ambulatory Visit (HOSPITAL_COMMUNITY): Admission: RE | Admit: 2009-12-28 | Discharge: 2009-12-28 | Payer: Self-pay | Admitting: Ophthalmology

## 2010-01-11 ENCOUNTER — Ambulatory Visit (HOSPITAL_COMMUNITY): Admission: RE | Admit: 2010-01-11 | Discharge: 2010-01-11 | Payer: Self-pay | Admitting: Ophthalmology

## 2010-02-02 ENCOUNTER — Telehealth: Payer: Self-pay | Admitting: Family Medicine

## 2010-02-03 ENCOUNTER — Encounter: Payer: Self-pay | Admitting: Family Medicine

## 2010-02-22 LAB — HM PAP SMEAR

## 2010-02-22 LAB — HM MAMMOGRAPHY

## 2010-03-10 ENCOUNTER — Ambulatory Visit: Admit: 2010-03-10 | Payer: Self-pay | Admitting: Orthopedic Surgery

## 2010-03-12 ENCOUNTER — Encounter: Payer: Self-pay | Admitting: Family Medicine

## 2010-03-13 ENCOUNTER — Encounter: Payer: Self-pay | Admitting: Family Medicine

## 2010-03-13 ENCOUNTER — Encounter: Payer: Self-pay | Admitting: Unknown Physician Specialty

## 2010-03-14 ENCOUNTER — Ambulatory Visit
Admission: RE | Admit: 2010-03-14 | Discharge: 2010-03-14 | Payer: Self-pay | Source: Home / Self Care | Attending: Family Medicine | Admitting: Family Medicine

## 2010-03-14 ENCOUNTER — Encounter: Payer: Self-pay | Admitting: Family Medicine

## 2010-03-14 LAB — CONVERTED CEMR LAB
ALT: 19 units/L (ref 0–35)
AST: 23 units/L (ref 0–37)
Albumin: 4.5 g/dL (ref 3.5–5.2)
Alkaline Phosphatase: 54 units/L (ref 39–117)
BUN: 21 mg/dL (ref 6–23)
Bilirubin, Direct: <0.1 mg/dL (ref 0.0–0.3)
CO2: 26 meq/L (ref 19–32)
Calcium: 9.6 mg/dL (ref 8.4–10.5)
Chloride: 102 meq/L (ref 96–112)
Cholesterol: 223 mg/dL — ABNORMAL HIGH (ref 0–200)
Creatinine, Ser: 0.8 mg/dL (ref 0.40–1.20)
Glucose, Bld: 115 mg/dL — ABNORMAL HIGH (ref 70–99)
HDL: 43 mg/dL (ref >39)
Hgb A1c MFr Bld: 6 % — ABNORMAL HIGH (ref <5.7)
LDL Cholesterol: 133 mg/dL — ABNORMAL HIGH (ref 0–99)
Potassium: 4.1 meq/L (ref 3.5–5.3)
Sodium: 138 meq/L (ref 135–145)
Total Bilirubin: 0.4 mg/dL (ref 0.3–1.2)
Total CHOL/HDL Ratio: 5.2
Total Protein: 7.5 g/dL (ref 6.0–8.3)
Triglycerides: 234 mg/dL — ABNORMAL HIGH (ref <150)
VLDL: 47 mg/dL — ABNORMAL HIGH (ref 0–40)
Vitamin B-12: 357 pg/mL (ref 211–911)

## 2010-03-24 NOTE — Assessment & Plan Note (Signed)
Summary: physical   Vital Signs:  Patient profile:   70 year old female Menstrual status:  hysterectomy Height:      64.5 inches Weight:      171.75 pounds BMI:     29.13 Pulse rhythm:   regular Resp:     16 per minute BP supine:   110 / 67  (right arm)  Vitals Entered By: Everitt Amber LPN (October 26, 2009 9:01 AM)  Nutrition Counseling: Patient's BMI is greater than 25 and therefore counseled on weight management options. CC: CPE   CC:  CPE.  History of Present Illness: Reports  that she has been doing fairly well. Denies recent fever or chills. Denies sinus pressure,  ear pain or sore throat. Denies chest congestion, or cough productive of sputum. Denies chest pain, palpitations, PND, orthopnea or leg swelling. Denies abdominal pain, nausea, vomitting, diarrhea or constipation. Denies change in bowel movements or bloody stool. Denies dysuria , frequency, incontinence or hesitancy.  Denies headaches, vertigo, seizures. Denies depression, anxiety or insomnia.Controlledon meds Denies  rash, lesions, or itch.     Current Medications (verified): 1)  Hydrochlorothiazide 25 Mg Tabs (Hydrochlorothiazide) .... Take 1 Tablet By Mouth Once A Day 2)  Zyrtec 10 Mg Tabs (Cetirizine Hcl) .... Take 1 Tablet By Mouth Once A Day 3)  Aspir-Low 81 Mg Tbec (Aspirin) .... Once Daily 4)  Flexeril 10 Mg  Tabs (Cyclobenzaprine Hcl) .... Take 1 Tablet By Mouth Three Times A Day 5)  Klor-Con 10 10 Meq  Tbcr (Potassium Chloride) .... Take Three Tablets By Mouth Once Daily 6)  Trazodone Hcl 150 Mg Tabs (Trazodone Hcl) .... Take 1 Tab By Mouth At Bedtime 7)  Cymbalta 60 Mg Cpep (Duloxetine Hcl) .... Take 1 Capsule By Mouth Once Daily   Dose Change 8)  Niaspan 1000 Mg Cr-Tabs (Niacin (Antihyperlipidemic)) .... Two Tabs By Mouth Qhs 9)  Vitamin C 1000 Mg Tabs (Ascorbic Acid) .... Take 1 Tablet By Mouth Once A Day 10)  Omeprazole 20 Mg Cpdr (Omeprazole) .... One Cap By Mouth Once Daily    Discontinue Nexium 11)  Meloxicam 15 Mg Tabs (Meloxicam) .... One Tablet Daily For 1 Week Then As Needed 12)  Meclizine Hcl 25 Mg Tabs (Meclizine Hcl) .... One Tablet Every 8 Hours As Needed  Allergies (verified): 1)  ! * Statins  Review of Systems      See HPI General:  Complains of fatigue. Eyes:  Denies discharge, double vision, eye pain, and red eye. ENT:  Complains of nasal congestion and postnasal drainage; incereased clear drainage esp post nasal coating tongue. MS:  Complains of joint pain, low back pain, mid back pain, and stiffness; 1 month h/o increased  shoulder pain wioth reduced mobility fell fwd on it downhill when weeding. Endo:  Denies cold intolerance, excessive thirst, excessive urination, and heat intolerance. Heme:  Denies abnormal bruising and bleeding. Allergy:  Complains of seasonal allergies; denies hives or rash and itching eyes.  Physical Exam  General:  Well-developed,well-nourished,in no acute distress; alert,appropriate and cooperative throughout examination Head:  Normocephalic and atraumatic without obvious abnormalities. No apparent alopecia or balding. Eyes:  No corneal or conjunctival inflammation noted. EOMI. Perrla. Funduscopic exam benign, without hemorrhages, exudates or papilledema. Vision grossly normal. Ears:  External ear exam shows no significant lesions or deformities.  Otoscopic examination reveals clear canals, tympanic membranes are intact bilaterally without bulging, retraction, inflammation or discharge. Hearing is grossly normal bilaterally. Nose:  External nasal examination shows no deformity or  inflammation. Nasal mucosa are pink and moist without lesions or exudates. Mouth:  pharynx pink and moist, fair dentition, and teeth missing.   Neck:  No deformities, masses, or tenderness noted. Chest Wall:  No deformities, masses, or tenderness noted. Breasts:  No mass, nodules, thickening, tenderness, bulging, retraction, inflamation, nipple  discharge or skin changes noted.   Lungs:  Normal respiratory effort, chest expands symmetrically. Lungs are clear to auscultation, no crackles or wheezes. Heart:  Normal rate and regular rhythm. S1 and S2 normal without gallop, murmur, click, rub or other extra sounds. Abdomen:  Bowel sounds positive,abdomen soft and non-tender without masses, organomegaly or hernias noted. Rectal:  No external abnormalities noted. Normal sphincter tone. No rectal masses or tenderness. Genitalia:  defreed per pot request Msk:  No deformity or scoliosis noted of thoracic or lumbar spine.   Pulses:  R and L carotid,radial,femoral,dorsalis pedis and posterior tibial pulses are full and equal bilaterally Extremities:  decreased ROM spine as well as right shoulder Neurologic:  alert & oriented X3, cranial nerves II-XII intact, strength normal in all extremities, gait normal, and finger-to-nose normal.   Skin:  Intact without suspicious lesions or rashes Cervical Nodes:  No lymphadenopathy noted Axillary Nodes:  No palpable lymphadenopathy Inguinal Nodes:  No significant adenopathy Psych:  Cognition and judgment appear intact. Alert and cooperative with normal attention span and concentration. No apparent delusions, illusions, hallucinations   Impression & Recommendations:  Problem # 1:  IMPAIRED FASTING GLUCOSE (ICD-790.21) Assessment Improved  Orders: T- Hemoglobin A1C (16109-60454) T- Hemoglobin A1C (09811-91478)  Labs Reviewed: Creat: 0.75 (10/21/2009)    Pt advised to reduce carbohydrate intake, espescially sweets, and to start regular physical activity, at least 30 minutes 5 days weekly, to enable weight loss, and reduce the risk of becoming diabetic   Problem # 2:  SPECIAL SCREENING FOR MALIGNANT NEOPLASMS COLON (ICD-V76.51) Assessment: Comment Only  Orders: Hemoccult Guaiac-1 spec.(in office) (82270)  Problem # 3:  SHOULDER PAIN, RIGHT (ICD-719.41) Assessment: Deteriorated  Her updated  medication list for this problem includes:    Aspir-low 81 Mg Tbec (Aspirin) ..... Once daily    Flexeril 10 Mg Tabs (Cyclobenzaprine hcl) .Marland Kitchen... Take 1 tablet by mouth three times a day    Meloxicam 15 Mg Tabs (Meloxicam) ..... One tablet daily for 1 week then as needed  Future Orders: Radiology Referral (Radiology) ... 10/27/2009  Problem # 4:  BACK PAIN (ICD-724.5) Assessment: Unchanged  Her updated medication list for this problem includes:    Aspir-low 81 Mg Tbec (Aspirin) ..... Once daily    Flexeril 10 Mg Tabs (Cyclobenzaprine hcl) .Marland Kitchen... Take 1 tablet by mouth three times a day    Meloxicam 15 Mg Tabs (Meloxicam) ..... One tablet daily for 1 week then as needed  Problem # 5:  HYPERTENSION (ICD-401.9) Assessment: Unchanged  Her updated medication list for this problem includes:    Hydrochlorothiazide 25 Mg Tabs (Hydrochlorothiazide) .Marland Kitchen... Take 1 tablet by mouth once a day  Orders: T-Basic Metabolic Panel [id number])  BP today: 110/67 Prior BP: 120/70 (07/08/2009)  Labs Reviewed: K+: 4.2 (10/21/2009) Creat: : 0.75 (10/21/2009)   Chol: 224 (10/21/2009)   HDL: 48 (10/21/2009)   LDL: 139 (10/21/2009)   TG: 185 (10/21/2009)  Problem # 6:  HYPERLIPIDEMIA (ICD-272.4) Assessment: Unchanged  Her updated medication list for this problem includes:    Niaspan 1000 Mg Cr-tabs (Niacin (antihyperlipidemic)) .Marland Kitchen..Marland Kitchen Two tabs by mouth qhs  Orders: T-Hepatic Function [phone]) T-Lipid Profile [phone])  Labs Reviewed: SGOT: 27 (10/21/2009)  SGPT: 28 (10/21/2009)   HDL:48 (10/21/2009), 49 (06/29/2009)  LDL:139 (10/21/2009), 128 (06/29/2009)  Chol:224 (10/21/2009), 220 (06/29/2009)  Trig:185 (10/21/2009), 217 (06/29/2009) Low fat diet discussed and encouraged, and patient given low fat diet information also.  Complete Medication List: 1)  Hydrochlorothiazide 25 Mg Tabs (Hydrochlorothiazide) .... Take 1 tablet by mouth once a day 2)  Zyrtec 10 Mg Tabs (Cetirizine hcl) ....  Take 1 tablet by mouth once a day 3)  Aspir-low 81 Mg Tbec (Aspirin) .... Once daily 4)  Flexeril 10 Mg Tabs (Cyclobenzaprine hcl) .... Take 1 tablet by mouth three times a day 5)  Klor-con 10 10 Meq Tbcr (Potassium chloride) .... Take three tablets by mouth once daily 6)  Trazodone Hcl 150 Mg Tabs (Trazodone hcl) .... Take 1 tab by mouth at bedtime 7)  Cymbalta 60 Mg Cpep (Duloxetine hcl) .... Take 1 capsule by mouth once daily   dose change 8)  Niaspan 1000 Mg Cr-tabs (Niacin (antihyperlipidemic)) .... Two tabs by mouth qhs 9)  Vitamin C 1000 Mg Tabs (Ascorbic acid) .... Take 1 tablet by mouth once a day 10)  Meloxicam 15 Mg Tabs (Meloxicam) .... One tablet daily for 1 week then as needed 11)  Meclizine Hcl 25 Mg Tabs (Meclizine hcl) .... One tablet every 8 hours as needed 12)  Pantoprazole Sodium 40 Mg Tbec (Pantoprazole sodium) .... Take 1 tablet by mouth once a day  Other Orders: Influenza Vaccine MCR (11914) Tdap => 49yrs IM (78295) Admin 1st Vaccine (62130) Admin 1st Vaccine [facility]) [phone])  Patient Instructions: 1)  Please schedule a follow-up appointment in 3.5 months. 2)  It is important that you exercise regularly at least 20 minutes 5 times a week. If you develop chest pain, have severe difficulty breathing, or feel very tired , stop exercising immediately and seek medical attention. 3)  You need to lose weight. Consider a lower calorie diet and regular exercise.  4)  HbgA1C prior to visit, ICD-9:  today 5)  BMP prior to visit, ICD-9: 6)  Hepatic Panel prior to visit, ICD-9: fasting in 3.5 months 7)  Lipid Panel prior to visit, ICD-9: 8)  HbgA1C prior to visit, ICD-9: 9)  [place] will be ccheduled. 10)  vaccines today Prescriptions: NIASPAN 1000 MG CR-TABS (NIACIN (ANTIHYPERLIPIDEMIC)) two tabs by mouth qhs  #60 Tablet x 3   Entered by:   Everitt Amber LPN   Authorized by:   Syliva Overman MD   Signed by:   Everitt Amber LPN on 69/62/9528   Method used:   [facility] to         [facility] [phone]* (retail)       [address]       [place], [place]  [address]       Ph: [phone] or [phone]       Fax: [phone]   RxID:   [id number] HYDROCHLOROTHIAZIDE 25 MG TABS (HYDROCHLOROTHIAZIDE) Take 1 tablet by mouth once a day  #30 Tablet x 3   Entered by:   Everitt Amber LPN   Authorized by:   Syliva Overman MD   Signed by:   Everitt Amber LPN on 93/23/5573   Method used:   Electronically to        [facility] [phone]* (retail)       [address]       [place], [place]  [address]  Ph: [phone] or [phone]       Fax: [phone]   RxID:   [id number] PANTOPRAZOLE SODIUM 40 MG TBEC (PANTOPRAZOLE SODIUM) Take 1 tablet by mouth once a day  #30 x 3   Entered and Authorized by:   Syliva Overman MD   Signed by:   Syliva Overman MD on 10/26/2009   Method used:   Printed then faxed to ...       [facility]  [address] [phone]* (retail)       [address]       [place], [place]  [address]       Ph: [phone] or [phone]       Fax: [phone]   RxID:   [id number] CYMBALTA 60 MG CPEP (DULOXETINE HCL) Take 1 capsule by mouth once daily   dose change  #60.0 Capsule x 3   Entered by:   Everitt Amber LPN   Authorized by:   Syliva Overman MD   Signed by:   Everitt Amber LPN on 55/73/2202   Method used:   Electronically to        [facility]. [phone]* (retail)       [address]       [place], [place]  [address]       Ph: [phone] or [phone]       Fax: [phone]   RxID:   [id number] KLOR-CON 10 10 MEQ  TBCR (POTASSIUM CHLORIDE) Take three tablets by mouth once daily  #90 Tablet x 3   Entered by:   Everitt Amber LPN   Authorized by:   Syliva Overman MD   Signed by:   Everitt Amber LPN on 67/89/3810   Method used:   Electronically to        [facility]. [phone]* (retail)       [address]       [place], [place]  [address]       Ph:  [phone] or [phone]       Fax: [phone]   RxID:   [id number] FLEXERIL 10 MG  TABS (CYCLOBENZAPRINE HCL) Take 1 tablet by mouth three times a day  #90 Tablet x 3   Entered by:   Everitt Amber LPN   Authorized by:   Syliva Overman MD   Signed by:   Everitt Amber LPN on 50/53/9767   Method used:   Electronically to        [facility]. [phone]* (retail)       [address]       [place], [place]  [address]       Ph: [phone] or [phone]       Fax: [phone]   RxID:   [id number] HYDROCHLOROTHIAZIDE 25 MG TABS (HYDROCHLOROTHIAZIDE) Take 1 tablet by mouth once a day  #30 Tablet x 0   Entered by:   Everitt Amber LPN   Authorized by:   Syliva Overman MD   Signed by:   Everitt Amber LPN on 31/49/7026   Method used:   Electronically to        [facility]. [phone]* (retail)       [address]       [place], [place]  [address]  Ph: [phone] or [phone]       Fax: [phone]   RxID:   [id number]    Laboratory Results    Stool - Occult Blood Hemmoccult #1: negative Date: 10/26/2009 Comments: 50590 1L 3/12 118 1012     Tetanus/Td Vaccine    Vaccine Type: Tdap    Site: right deltoid    Mfr: boostrix    Dose: 0.5 ml    Route: IM    Given by: Everitt Amber LPN    Exp. Date: 08/20/2011    Lot #: [id number]  Influenza Vaccine    Vaccine Type: Fluvax MCR    Site: left deltoid    Mfr: novartis    Dose: 0.5 ml    Route: IM    Given by: Everitt Amber LPN    Exp. Date: 06/2010    Lot #: [id number]

## 2010-03-24 NOTE — Letter (Signed)
Summary: Letter  Letter   Imported By: Lind Guest 07/05/2009 09:24:31  _____________________________________________________________________  External Attachment:    Type:   Image     Comment:   External Document

## 2010-03-24 NOTE — Progress Notes (Signed)
  Phone Note From Pharmacy   [facility]  [address]. [phone]* Summary of Call: nexium  requires pa Initial call taken by: Adella Hare LPN,  May 26, 2009 2:31 PM  Follow-up for Phone Call        advise pt and change to omeprazole 20mg  Take 1 capsule by mouth once a day #30 refill3 Follow-up by: Syliva Overman MD,  May 27, 2009 5:01 AM  Additional Follow-up for Phone Call Additional follow up Details #1::        Called Patient, Prescription resent Additional Follow-up by: Adella Hare LPN,  May 27, 2009 10:44 AM    New/Updated Medications: OMEPRAZOLE 20 MG CPDR (OMEPRAZOLE) one cap by mouth once daily   discontinue nexium Prescriptions: OMEPRAZOLE 20 MG CPDR (OMEPRAZOLE) one cap by mouth once daily   discontinue nexium  #30 x 3   Entered by:   Adella Hare LPN   Authorized by:   Syliva Overman MD   Signed by:   Adella Hare LPN on 96/05/5407   Method used:   Electronically to        [facility]. [phone]* (retail)       [address]       [place], [place]  [address]       Ph: [phone] or [phone]       Fax: [phone]   RxID:   [id number]

## 2010-03-24 NOTE — Progress Notes (Signed)
Summary: test results  Phone Note Call from Patient   Summary of Call: pt would like results of x-rays. [phone] Initial call taken by: Rudene Anda,  August 16, 2009 2:02 PM  Follow-up for Phone Call        called patient, left message Follow-up by: Adella Hare LPN,  August 16, 2009 2:12 PM

## 2010-03-24 NOTE — Letter (Signed)
Summary: PRE AUTH MEDICINE  PRE AUTH MEDICINE   Imported By: Lind Guest 02/03/2010 14:50:28  _____________________________________________________________________  External Attachment:    Type:   Image     Comment:   External Document

## 2010-03-24 NOTE — Progress Notes (Signed)
Summary: MEDICINE  Phone Note Call from Patient   Summary of Call: NEEDS HER TRAZODONE [facility]ENT [facility] [place]  Initial call taken by: Lind Guest,  October 22, 2009 11:32 AM    Prescriptions: TRAZODONE HCL 150 MG TABS (TRAZODONE HCL) Take 1 tab by mouth at bedtime  #30 Tablet x 0   Entered by:   Everitt Amber LPN   Authorized by:   Syliva Overman MD   Signed by:   Everitt Amber LPN on 86/57/8469   Method used:   Electronically to        [facility]. [phone]* (retail)       [address]       [place], [place]  [address]       Ph: [phone] or [phone]       Fax: [phone]   RxID:   [id number]

## 2010-03-24 NOTE — Miscellaneous (Signed)
  Clinical Lists Changes  Medications: Removed medication of CYMBALTA 60 MG CPEP (DULOXETINE HCL) two capsules once daily effeective 03/14/2010 Added new medication of CYMBALTA 60 MG CPEP (DULOXETINE HCL) Take 1 capsule by mouth two times a day - Signed Rx of CYMBALTA 60 MG CPEP (DULOXETINE HCL) Take 1 capsule by mouth two times a day;  #60 x 3;  Signed;  Entered by: Syliva Overman MD;  Authorized by: Syliva Overman MD;  Method used: Historical    Prescriptions: CYMBALTA 60 MG CPEP (DULOXETINE HCL) Take 1 capsule by mouth two times a day  #60 x 3   Entered and Authorized by:   Syliva Overman MD   Signed by:   Syliva Overman MD on 03/14/2010   Method used:   Historical   RxID:   [id number]

## 2010-03-24 NOTE — Progress Notes (Signed)
Summary: WANTS A B12 INJECTION  Phone Note Call from Patient   Summary of Call: BEEN FEELING TIRED AND WANTS TO KNOW CAN SHE COME GET A B 12 SHOT CALL [facility] AT [phone] Initial call taken by: Lind Guest,  February 02, 2010 4:22 PM  Follow-up for Phone Call        pl s order b12 level to see if she needs iot  Follow-up by: Syliva Overman MD,  February 02, 2010 4:59 PM  Additional Follow-up for Phone Call Additional follow up Details #1::        test ordered, called patient, left message Additional Follow-up by: Adella Hare LPN,  February 04, 2010 4:25 PM    Additional Follow-up for Phone Call Additional follow up Details #2::    called patient, left message  Follow-up by: Adella Hare LPN,  February 07, 2010 1:19 PM   Appended Document: WANTS A B12 INJECTION patient aware

## 2010-03-24 NOTE — Progress Notes (Signed)
  Phone Note From Pharmacy   [facility] [place]. [phone]* Summary of Call: insurance will not pay for two times a day will pay for higher dose Initial call taken by: Adella Hare LPN,  May 03, 2009 11:24 AM  Follow-up for Phone Call        what drug? Follow-up by: Syliva Overman MD,  May 03, 2009 1:03 PM  Additional Follow-up for Phone Call Additional follow up Details #1::        sorry cymbalta Additional Follow-up by: Adella Hare LPN,  May 03, 2009 2:45 PM    Additional Follow-up for Phone Call Additional follow up Details #2::    the highest mg of cymbalta is 60mg  , so if they will pay for once daily only then has to be changed to 60mg  once daily #30 let pt know pls Follow-up by: Syliva Overman MD,  May 03, 2009 5:38 PM  Additional Follow-up for Phone Call Additional follow up Details #3:: Details for Additional Follow-up Action Taken: dose changed at pharmacy, called patient, left message Additional Follow-up by: Adella Hare LPN,  May 05, 2009 8:50 AM  New/Updated Medications: CYMBALTA 60 MG CPEP (DULOXETINE HCL) Take 1 capsule by mouth once daily   dose change Prescriptions: CYMBALTA 60 MG CPEP (DULOXETINE HCL) Take 1 capsule by mouth once daily   dose change  #30 x 2   Entered by:   Adella Hare LPN   Authorized by:   Syliva Overman MD   Signed by:   Adella Hare LPN on 46/96/2952   Method used:   Electronically to        [facility]. [phone]* (retail)       [address]       [place], [place]  [address]       Ph: [phone] or [phone]       Fax: [phone]   RxID:   [id number]  patient aware
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
## 2010-03-24 NOTE — Assessment & Plan Note (Signed)
Summary: Right shoulder pain.Marland Kitchen Room-2   Vital Signs:  Patient profile:   70 year old female Menstrual status:  hysterectomy Height:      64.5 inches Weight:      173.25 pounds BMI:     29.38 O2 Sat:      97 % Pulse rate:   91 / minute Resp:     16 per minute BP sitting:   120 / 60  (left arm) Cuff size:   large  Vitals Entered By: Everitt Amber LPN (May 06, 2009 4:03 PM) CC: Follow up chronic problems, right shoulder pain   CC:  Follow up chronic problems and right shoulder pain.  History of Present Illness: Pt is here today with c/o Rt shoulder pain x approx 1 wk.  No trauma.  Did do some extra cleaning in her home though & thinks this may have started it.  Did have rotator cuff surgery approx 5-6 yrs ago.  Not sleeping well due to pain. Some discomfort across back of shoulder area too.  Hx of fibromyalgia. She is not currently taking any NSAIDS.  Has Meloxicam at home, but did n't feel like it helped much when used it in the past for something else.  Pt states she is due for her lab work.  Also is overdue for her f/u of her htn, etc.  She states her Meniere's and fibromyalgia have been doing well with her current meds.   Allergies: 1)  ! * Statins  Past History:  Past medical history reviewed for relevance to current acute and chronic problems.  Past Medical History: Reviewed history from 02/08/2007 and no changes required.   CONSTIPATION NOS (ICD-564.00) BRONCHITIS, ACUTE (ICD-466.0) MENIERE'S DISEASE (ICD-386.00) FIBROMYALGIA (ICD-729.1) OSTEOPOROSIS (ICD-733.00) HYPERTENSION (ICD-401.9) HYPERLIPIDEMIA (ICD-272.4) GERD (ICD-530.81) DEPRESSION (ICD-311) ALLERGIC RHINITIS (ICD-477.9)  Review of Systems CV:  Denies chest pain or discomfort. Resp:  Denies shortness of breath. MS:  Complains of joint pain, muscle aches, and stiffness; denies joint redness and joint swelling. Neuro:  Denies numbness and tingling.  Physical Exam  General:   Well-developed,well-nourished,in no acute distress; alert,appropriate and cooperative throughout examination Head:  Normocephalic and atraumatic without obvious abnormalities. No apparent alopecia or balding. Lungs:  Normal respiratory effort, chest expands symmetrically. Lungs are clear to auscultation, no crackles or wheezes. Heart:  Normal rate and regular rhythm. S1 and S2 normal without gallop, murmur, click, rub or other extra sounds. Msk:  Full ROM Rt shoulder, but pain at 90 degrees.  Pain with resistance to elevation.  TTP at [facility] joint & joint line.  Also some mild TTP of trapezius across posterior shoulder area. Grip strength strong & equal. Pulses:  R radial normal.   Neurologic:  alert & oriented X3, sensation intact to light touch, and DTRs symmetrical and normal.   Skin:  Intact without suspicious lesions or rashes Psych:  Cognition and judgment appear intact. Alert and cooperative with normal attention span and concentration. No apparent delusions, illusions, hallucinations   Impression & Recommendations:  Problem # 1:  SHOULDER PAIN, RIGHT (ICD-719.41) Assessment New  Her updated medication list for this problem includes:    Aspir-low 81 Mg Tbec (Aspirin) ..... Once daily    Flexeril 10 Mg Tabs (Cyclobenzaprine hcl) .Marland Kitchen... Take 1 tablet by mouth three times a day    Meloxicam 7.5 Mg Tabs (Meloxicam) .Marland Kitchen... Take 1 tablet by mouth once a day  Orders: Admin of Therapeutic Inj  intramuscular or subcutaneous (47829)  Problem # 2:  FIBROMYALGIA (ICD-729.1) Assessment:  Unchanged  Her updated medication list for this problem includes:    Aspir-low 81 Mg Tbec (Aspirin) ..... Once daily    Flexeril 10 Mg Tabs (Cyclobenzaprine hcl) .Marland Kitchen... Take 1 tablet by mouth three times a day    Meloxicam 7.5 Mg Tabs (Meloxicam) .Marland Kitchen... Take 1 tablet by mouth once a day  Problem # 3:  HYPERTENSION (ICD-401.9) Assessment: Unchanged  Her updated medication list for this problem includes:     Hydrochlorothiazide 25 Mg Tabs (Hydrochlorothiazide) .Marland Kitchen... Take 1 tablet by mouth once a day  Orders: T-Comprehensive Metabolic Panel ([id number])  BP today: 120/60 Prior BP: 120/64 (11/19/2008)  Labs Reviewed: K+: 4.3 (07/31/2008) Creat: : 0.88 (07/31/2008)   Chol: 208 (07/31/2008)   HDL: 50 (07/31/2008)   LDL: 116 (07/31/2008)   TG: 209 (07/31/2008)  Complete Medication List: 1)  Hydrochlorothiazide 25 Mg Tabs (Hydrochlorothiazide) .... Take 1 tablet by mouth once a day 2)  Nexium 40 Mg Cpdr (Esomeprazole magnesium) .... Once daily 3)  Zyrtec 10 Mg Tabs (Cetirizine hcl) .... Take 1 tablet by mouth once a day 4)  Aspir-low 81 Mg Tbec (Aspirin) .... Once daily 5)  Flexeril 10 Mg Tabs (Cyclobenzaprine hcl) .... Take 1 tablet by mouth three times a day 6)  Klor-con 10 10 Meq Tbcr (Potassium chloride) .... Take three tablets by mouth once daily 7)  Trazodone Hcl 150 Mg Tabs (Trazodone hcl) .... Take 1 tab by mouth at bedtime 8)  Cymbalta 60 Mg Cpep (Duloxetine hcl) .... Take 1 capsule by mouth once daily   dose change 9)  Relpax 40 Mg Tabs (Eletriptan hydrobromide) .... Take one tab at onset of migraine, repeat in 2 hrs 10)  Niaspan 1000 Mg Cr-tabs (Niacin (antihyperlipidemic)) .... Two tabs by mouth qhs 11)  Vitamin C 1000 Mg Tabs (Ascorbic acid) .... Take 1 tablet by mouth once a day 12)  Meloxicam 7.5 Mg Tabs (Meloxicam) .... Take 1 tablet by mouth once a day 13)  Veetids 500 Mg Tabs (Penicillin v potassium) .... Take 1 tablet by mouth three times a day  Other Orders: Depo- Medrol 80mg  (J1040) Ketorolac-Toradol 15mg  (U9811) T-Lipid Profile ([id number]) T-CBC No Diff (13086-57846) T-TSH (96295-28413) T-Vitamin D (25-Hydroxy) (24401-02725)  Patient Instructions: 1)  Please schedule a follow-up appointment in 1 month. 2)  Ice your shoulder 3-4 times a day for 10-15 mins. 3)  Restart Meloxicam once daily. You may increase to 2 daily if needed. 4)  You have received Depo Medrol  shot for inflammation, and Toradol for pain.   Medication Administration  Injection # 1:    Medication: Depo- Medrol 80mg     Diagnosis: SHOULDER PAIN, RIGHT (ICD-719.41)    Route: IM    Site: RUOQ gluteus    Exp Date: 11/11    Lot #: [id number]    Mfr: [employer]    Patient tolerated injection without complications    Given by: Adella Hare LPN (May 06, 2009 4:46 PM)  Injection # 2:    Medication: Ketorolac-Toradol 15mg     Diagnosis: SHOULDER PAIN, RIGHT (ICD-719.41)    Route: IM    Site: LUOQ gluteus    Exp Date: 09/21/2010    Lot #: [id number]    Mfr: [email or web address]    Comments: toradol 60mg  given    Patient tolerated injection without complications    Given by: Adella Hare LPN (May 06, 2009 4:47 PM)  Orders Added: 1)  Depo- Medrol 80mg  [J1040] 2)  Ketorolac-Toradol 15mg  [J1885] 3)  T-Comprehensive Metabolic Panel [80053-22900] 4)  T-Lipid  Profile [80061-22930] 5)  T-CBC No Diff [85027-10000] 6)  T-TSH [[address]-60454] 7)  T-Vitamin D (25-Hydroxy) [09811-91478] 8)  Admin of Therapeutic Inj  intramuscular or subcutaneous [96372] 9)  Est. Patient Level IV [29562]
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
## 2010-04-06 ENCOUNTER — Telehealth: Payer: Self-pay | Admitting: Family Medicine

## 2010-04-08 ENCOUNTER — Telehealth: Payer: Self-pay | Admitting: Family Medicine

## 2010-04-13 NOTE — Progress Notes (Signed)
  Phone Note From Pharmacy   [facility] [address] [phone]* Summary of Call: insurance does not want to pay for cyclobenzaprine alternatives are baclofen and tizanidine Initial call taken by: Adella Hare LPN,  April 06, 2010 4:59 PM  Follow-up for Phone Call        pls notify pharmacy and pt, change to tizanidine, script entered historically Follow-up by: Syliva Overman MD,  April 06, 2010 5:18 PM  Additional Follow-up for Phone Call Additional follow up Details #1::        called patient, no answer  med change sent Additional Follow-up by: Adella Hare LPN,  April 07, 2010 8:59 AM    Additional Follow-up for Phone Call Additional follow up Details #2::    patient aware Follow-up by: Adella Hare LPN,  April 08, 2010 8:24 AM  New/Updated Medications: ZANAFLEX 4 MG TABS (TIZANIDINE HCL) Take 1 tablet by mouth three times a day , discontinue cyclobenzaprine Prescriptions: ZANAFLEX 4 MG TABS (TIZANIDINE HCL) Take 1 tablet by mouth three times a day , discontinue cyclobenzaprine  #90 x 2   Entered by:   Adella Hare LPN   Authorized by:   Syliva Overman MD   Signed by:   Adella Hare LPN on 81/19/1478   Method used:   Electronically to        [facility]. [phone]* (retail)       [address]       [place], [place]  [address]       Ph: [phone]       Fax: [phone]   RxID:   [id number] ZANAFLEX 4 MG TABS (TIZANIDINE HCL) Take 1 tablet by mouth three times a day , discontinue cyclobenzaprine  #90 x 2   Entered and Authorized by:   Syliva Overman MD   Signed by:   Syliva Overman MD on 04/06/2010   Method used:   Historical   RxID:   [id number]

## 2010-04-14 ENCOUNTER — Encounter: Payer: Self-pay | Admitting: Family Medicine

## 2010-04-19 NOTE — Progress Notes (Signed)
  Phone Note Call from Patient   Summary of Call: Insurance will not cover omeprazole 40mg , but they will cover Omeprazole 20mg . Do you want to change? Initial call taken by: Everitt Amber LPN,  April 08, 2010 2:47 PM  Follow-up for Phone Call        changed ,pls notify both parties Follow-up by: Syliva Overman MD,  April 09, 2010 3:58 AM  Additional Follow-up for Phone Call Additional follow up Details #1::        called patient, no answer Additional Follow-up by: Everitt Amber LPN,  April 11, 2010 4:21 PM    Additional Follow-up for Phone Call Additional follow up Details #2::    pt/pharmacy aware Follow-up by: Everitt Amber LPN,  April 12, 2010 1:17 PM  New/Updated Medications: OMEPRAZOLE 20 MG CPDR (OMEPRAZOLE) Take 1 capsule by mouth two times a day Prescriptions: OMEPRAZOLE 20 MG CPDR (OMEPRAZOLE) Take 1 capsule by mouth two times a day  #60 x 5   Entered by:   Everitt Amber LPN   Authorized by:   Syliva Overman MD   Signed by:   Everitt Amber LPN on 86/57/8469   Method used:   Printed then faxed to ...       [facility]  [address] [phone]* (retail)       [address]       [place], [place]  [address]       Ph: [phone]       Fax: [phone]   RxID:   [id number] OMEPRAZOLE 20 MG CPDR (OMEPRAZOLE) Take 1 capsule by mouth two times a day  #60 x 5   Entered and Authorized by:   Syliva Overman MD   Signed by:   Syliva Overman MD on 04/09/2010   Method used:   Historical   RxID:   [id number]

## 2010-04-19 NOTE — Letter (Signed)
Summary: d/c medicine  d/c medicine   Imported By: Lind Guest 04/14/2010 11:45:16  _____________________________________________________________________  External Attachment:    Type:   Image     Comment:   External Document

## 2010-05-03 LAB — POCT I-STAT 4, (NA,K, GLUC, HGB,HCT)
Glucose, Bld: 105 mg/dL — ABNORMAL HIGH (ref 70–99)
HCT: 41 % (ref 36.0–46.0)
Hemoglobin: 13.9 g/dL (ref 12.0–15.0)
Potassium: 4 mEq/L (ref 3.5–5.1)
Sodium: 142 mEq/L (ref 135–145)

## 2010-05-24 ENCOUNTER — Other Ambulatory Visit: Payer: Self-pay | Admitting: Family Medicine

## 2010-06-07 LAB — CREATININE, SERUM
Creatinine, Ser: 0.8 mg/dL (ref 0.4–1.2)
GFR calc Af Amer: >60 mL/min (ref >60)
GFR calc non Af Amer: >60 mL/min (ref >60)

## 2010-06-09 ENCOUNTER — Encounter: Payer: Self-pay | Admitting: Family Medicine

## 2010-06-09 ENCOUNTER — Other Ambulatory Visit: Payer: Self-pay | Admitting: Family Medicine

## 2010-06-09 DIAGNOSIS — R7302 Impaired glucose tolerance (oral): Secondary | ICD-10-CM

## 2010-06-09 LAB — HEPATIC FUNCTION PANEL
ALT: 30 U/L (ref 0–35)
AST: 40 U/L — ABNORMAL HIGH (ref 0–37)
Albumin: 4.5 g/dL (ref 3.5–5.2)
Alkaline Phosphatase: 45 U/L (ref 39–117)
Bilirubin, Direct: 0.1 mg/dL (ref 0.0–0.3)
Indirect Bilirubin: 0.4 mg/dL (ref 0.0–0.9)
Total Bilirubin: 0.5 mg/dL (ref 0.3–1.2)
Total Protein: 7.5 g/dL (ref 6.0–8.3)

## 2010-06-09 LAB — BASIC METABOLIC PANEL
BUN: 14 mg/dL (ref 6–23)
CO2: 25 mEq/L (ref 19–32)
Calcium: 9.8 mg/dL (ref 8.4–10.5)
Chloride: 103 mEq/L (ref 96–112)
Creat: 0.91 mg/dL (ref 0.40–1.20)
Glucose, Bld: 122 mg/dL — ABNORMAL HIGH (ref 70–99)
Potassium: 3.7 mEq/L (ref 3.5–5.3)
Sodium: 139 mEq/L (ref 135–145)

## 2010-06-09 LAB — LIPID PANEL
Cholesterol: 188 mg/dL (ref 0–200)
HDL: 48 mg/dL (ref >39)
LDL Cholesterol: 119 mg/dL — ABNORMAL HIGH (ref 0–99)
Total CHOL/HDL Ratio: 3.9 Ratio
Triglycerides: 103 mg/dL (ref <150)
VLDL: 21 mg/dL (ref 0–40)

## 2010-06-10 ENCOUNTER — Emergency Department (HOSPITAL_COMMUNITY)
Admission: EM | Admit: 2010-06-10 | Discharge: 2010-06-11 | Disposition: A | Discharge status: Home / Self Care | Payer: Medicare Other | Attending: Emergency Medicine | Admitting: Emergency Medicine

## 2010-06-10 ENCOUNTER — Encounter: Payer: Self-pay | Admitting: Family Medicine

## 2010-06-10 DIAGNOSIS — Z79899 Other long term (current) drug therapy: Secondary | ICD-10-CM | POA: Insufficient documentation

## 2010-06-10 DIAGNOSIS — R109 Unspecified abdominal pain: Secondary | ICD-10-CM | POA: Insufficient documentation

## 2010-06-10 DIAGNOSIS — E7800 Pure hypercholesterolemia, unspecified: Secondary | ICD-10-CM | POA: Insufficient documentation

## 2010-06-10 DIAGNOSIS — K5900 Constipation, unspecified: Secondary | ICD-10-CM | POA: Insufficient documentation

## 2010-06-10 DIAGNOSIS — R42 Dizziness and giddiness: Secondary | ICD-10-CM | POA: Insufficient documentation

## 2010-06-10 DIAGNOSIS — IMO0001 Reserved for inherently not codable concepts without codable children: Secondary | ICD-10-CM | POA: Insufficient documentation

## 2010-06-10 DIAGNOSIS — K6289 Other specified diseases of anus and rectum: Secondary | ICD-10-CM | POA: Insufficient documentation

## 2010-06-10 DIAGNOSIS — Z7982 Long term (current) use of aspirin: Secondary | ICD-10-CM | POA: Insufficient documentation

## 2010-06-10 NOTE — Progress Notes (Signed)
Addended by: Adella Hare on: 06/10/2010 08:32 AM   Modules accepted: Orders

## 2010-06-11 ENCOUNTER — Other Ambulatory Visit: Payer: Self-pay | Admitting: Family Medicine

## 2010-06-12 LAB — HEMOGLOBIN A1C
Hgb A1c MFr Bld: 6.1 % — ABNORMAL HIGH (ref <5.7)
Mean Plasma Glucose: 128 mg/dL — ABNORMAL HIGH (ref <117)

## 2010-06-13 ENCOUNTER — Ambulatory Visit (INDEPENDENT_AMBULATORY_CARE_PROVIDER_SITE_OTHER): Payer: Medicare Other | Admitting: Family Medicine

## 2010-06-13 ENCOUNTER — Encounter: Payer: Self-pay | Admitting: Family Medicine

## 2010-06-13 VITALS — BP 120/68 | HR 78 | Resp 16 | Ht 64.0 in | Wt 170.8 lb

## 2010-06-13 DIAGNOSIS — IMO0002 Reserved for concepts with insufficient information to code with codable children: Secondary | ICD-10-CM

## 2010-06-13 DIAGNOSIS — F3289 Other specified depressive episodes: Secondary | ICD-10-CM

## 2010-06-13 DIAGNOSIS — R7309 Other abnormal glucose: Secondary | ICD-10-CM

## 2010-06-13 DIAGNOSIS — F329 Major depressive disorder, single episode, unspecified: Secondary | ICD-10-CM

## 2010-06-13 DIAGNOSIS — I10 Essential (primary) hypertension: Secondary | ICD-10-CM

## 2010-06-13 DIAGNOSIS — E663 Overweight: Secondary | ICD-10-CM

## 2010-06-13 DIAGNOSIS — E785 Hyperlipidemia, unspecified: Secondary | ICD-10-CM

## 2010-06-13 DIAGNOSIS — M549 Dorsalgia, unspecified: Secondary | ICD-10-CM

## 2010-06-13 DIAGNOSIS — R7302 Impaired glucose tolerance (oral): Secondary | ICD-10-CM

## 2010-06-13 DIAGNOSIS — K5900 Constipation, unspecified: Secondary | ICD-10-CM

## 2010-06-13 MED ORDER — DULOXETINE HCL 60 MG PO CPEP: 60.0000 mg | ORAL_CAPSULE | Freq: Every day | ORAL | Status: DC

## 2010-06-13 MED ORDER — MECLIZINE HCL 25 MG PO TABS: 25.0000 mg | ORAL_TABLET | Freq: Every day | ORAL | Status: DC | PRN

## 2010-06-13 MED ORDER — CHOLINE FENOFIBRATE 135 MG PO CPDR: 135.0000 mg | DELAYED_RELEASE_CAPSULE | Freq: Every day | ORAL | Status: DC

## 2010-06-13 MED ORDER — HYDROCHLOROTHIAZIDE 25 MG PO TABS: 25.0000 mg | ORAL_TABLET | Freq: Every day | ORAL | Status: DC

## 2010-06-13 MED ORDER — NIACIN ER (ANTIHYPERLIPIDEMIC) 1000 MG PO TBCR: 1000.0000 mg | EXTENDED_RELEASE_TABLET | Freq: Every day | ORAL | Status: DC

## 2010-06-13 NOTE — Patient Instructions (Signed)
F/u in 4 months.  Fasting lipid, hepatic, chem 7, and hBa1C in 4 months  It is important that you exercise regularly at least 30 minutes 5 times a week. If you develop chest pain, have severe difficulty breathing, or feel very tired, stop exercising immediately and seek medical attention    A healthy diet is rich in fruit, vegetables and whole grains. Poultry fish, nuts and beans are a healthy choice for protein rather then red meat. A low sodium diet and drinking 64 ounces of water daily is generally recommended. Oils and sweet should be limited. Carbohydrates especially for those who are diabetic or overweight, should be limited to 34-45 gram per meal. It is important to eat on a regular schedule, at least 3 times daily. Snacks should be primarily fruits, vegetables or nuts.

## 2010-06-13 NOTE — Progress Notes (Signed)
  Subjective:    Patient ID: Andrea Santiago, female    DOB: 1940/09/02, 70 y.o.   MRN: [id number]  HPI Pt was in the ED 3 days ago,she was given a fleets enema with relief.for constipation, has not had so severe a problem before. She has otherwise been doing well, with no new complaints.  She is working hard on weight loss by dietary change in particular, she has been succesful in losing 6 pounds over the last 4 months, which is wonderful   Review of Systems Denies recent fever or chills. Denies sinus pressure, nasal congestion, ear pain or sore throat. Denies chest congestion, productive cough or wheezing. Denies chest pains, palpitations, paroxysmal nocturnal dyspnea, orthopnea and leg swelling Denies abdominal pain, nausea, vomiting,diarrhea or constipation.   Denies dysuria, frequency, hesitancy or incontinence. Chronic back pain with limitation in mobility Denies headaches, seizure, numbness, or tingling. Denies uncontrolled  depression, anxiety or insomnia. Denies skin break down or rash.         Objective:   Physical Exam Patient alert and oriented and in no Cardiopulmonary distress.  HEENT: No facial asymmetry, EOMI, no sinus tenderness, TM's clear, Oropharynx pink and moist.  Neck  no adenopathy.  Chest: Clear to auscultation bilaterally.  CVS: S1, S2 no murmurs, no S3.  ABD: Soft non tender. Bowel sounds normal.  Ext: No edema  MS: decreased  ROM spine,adequate in  shoulders, hips and knees.  Skin: Intact, no ulcerations or rash noted.  Psych: Good eye contact, normal affect. Memory intact not anxious or depressed appearing.  CNS: CN 2-12 intact, power, tone and sensation normal throughout.        Assessment & Plan:

## 2010-06-27 ENCOUNTER — Telehealth: Payer: Self-pay | Admitting: Family Medicine

## 2010-06-27 NOTE — Telephone Encounter (Signed)
pls type a leter of necessity for daily  stool softener and miralax  Due to chronic medical conditions, I will sign

## 2010-06-27 NOTE — Telephone Encounter (Signed)
Typed and available

## 2010-06-29 NOTE — Assessment & Plan Note (Signed)
Controlled, no change in medication  

## 2010-06-29 NOTE — Assessment & Plan Note (Signed)
Improved, no med change, continue low fat diet

## 2010-06-29 NOTE — Assessment & Plan Note (Signed)
Improved, continue  meds 

## 2010-06-29 NOTE — Assessment & Plan Note (Signed)
Unchanged, no change in management, pt attempting to lose weight and increase activity to manage pain

## 2010-06-30 ENCOUNTER — Telehealth: Payer: Self-pay | Admitting: Family Medicine

## 2010-06-30 ENCOUNTER — Encounter: Payer: Self-pay | Admitting: Family Medicine

## 2010-06-30 NOTE — Telephone Encounter (Signed)
Called patient left message

## 2010-06-30 NOTE — Assessment & Plan Note (Signed)
Improved, pt encouraged to continue same

## 2010-06-30 NOTE — Assessment & Plan Note (Signed)
Improved, no change in med at that time. Pt applauded on weight loss and encouraged to continue this , which will help with the back pain

## 2010-06-30 NOTE — Telephone Encounter (Signed)
Handwritten on letterhead for Dr to sign

## 2010-06-30 NOTE — Telephone Encounter (Signed)
It was a preventative surgery because they thought she had cancer at one point so they removed her breast. She has already checked with her insurance and they will cover it

## 2010-06-30 NOTE — Assessment & Plan Note (Addendum)
Recent flare requiring eD visit , now resolved. The importance of a diet high in vegetable, fruit  And fiber was stressed
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
## 2010-07-08 NOTE — Op Note (Signed)
NAME:  Andrea Santiago, Andrea Santiago                ACCOUNT NO.:  [id number]   MEDICAL RECORD NO.:  [id number]          PATIENT TYPE:  AMB   LOCATION:  DAY                           FACILITY:  [facility]   PHYSICIAN:  R. Roetta Sessions, M.D. DATE OF BIRTH:  [birth year]   DATE OF PROCEDURE:  11/30/2003  DATE OF DISCHARGE:                                 OPERATIVE REPORT   PROCEDURE:  Esophagogastroduodenoscopy with Elease Hashimoto dilation.   INDICATION FOR PROCEDURE:  The patient is a 70 year old lady with  intermittent nausea and vomiting and reflux symptoms, intermittent  esophageal dysphagia to solids.  EGD is now being done to further evaluate  dysphagia and longstanding GERD.  This approach has been discussed with the  patient at length, the potential risks, benefits, and alternatives have been  reviewed.  She is agreeable.  Please see my November 18, 2003, dictated H&P  for more information.   PROCEDURE NOTE:  O2 saturation, blood pressure, pulse, and respirations were  monitored throughout the entire procedure.   CONSCIOUS SEDATION:  Versed 3 mg IV, Demerol 75 mg IV in divided doses.   INSTRUMENT USED:  Olympus video chip system.   FINDINGS:  Examination of the tubular esophagus revealed no mucosal  abnormalities.  The EG junction was easily traversed.   Stomach:  The gastric cavity was empty and insufflated well with air.  A  thorough examination of the gastric mucosa including a retroflexed view of  the proximal stomach and esophagogastric junction demonstrated only a couple  of tiny antral erosions.  Pylorus patent and easily traversed.  Examination  of the bulb and second portion revealed no abnormalities.   Therapeutic/diagnostic maneuvers performed:  A 56 French Maloney dilator was  passed to full insertion with ease.  A look back revealed no apparent  complication related to passage of the dilator.  The patient tolerated the  procedure well, was reacted in endoscopy.   IMPRESSION:  1.  Normal  esophagus.  2.  A couple of tiny antral erosions, otherwise normal stomach.  3.  Normal D1, D2, status post passage of a 56 [place] Maloney dilator as      described above.   RECOMMENDATIONS:  1.  Continue Nexium 40 mg orally daily.  2.  Will see this nice lady back in the office in four weeks to see how she      is doing and go from there.     Otelia Sergeant   RMR/MEDQ  D:  11/30/2003  T:  11/30/2003  Job:  [id number]   cc:   Milus Mallick. Lodema Hong, M.D.  [address]  [place], [place] [address]  Fax: [phone]

## 2010-07-08 NOTE — Procedures (Signed)
Andrea Santiago, BOZA                          ACCOUNT NO.:  [id number]   MEDICAL RECORD NO.:  [id number]                   PATIENT TYPE:  OUT   LOCATION:  RESP                                 FACILITY:  [facility]   PHYSICIAN:  Edward L. Juanetta Gosling, M.D.             DATE OF BIRTH:  July 26, 1940   DATE OF PROCEDURE:  DATE OF DISCHARGE:                              PULMONARY FUNCTION TEST   1. Spirometry is normal.  2. One volume shows minimal restrictive change with TLC being 77.8% of     predicted, with normal being 80%.  3. DLCO is mildly reduced.  4. Arterial blood gases are normal.       ___________________________________________                                            Oneal Deputy. Juanetta Gosling, M.D.   ELH/MEDQ  D:  07/27/2003  T:  07/27/2003  Job:  [id number]   cc:   Milus Mallick. Lodema Hong, M.D.  [address]  [place], [place] [address]  Fax: [phone]
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
## 2010-07-08 NOTE — Procedures (Signed)
[name], Andrea Santiago                            ACCOUNT NO.:  [id number]   MEDICAL RECORD NO.:  [id number]                  PATIENT TYPE:   LOCATION:                                       FACILITY:  [facility]   PHYSICIAN:  Edward L. Juanetta Gosling, M.D.             DATE OF BIRTH:   DATE OF PROCEDURE:  DATE OF DISCHARGE:                                EKG INTERPRETATION   The rhythm is probably sinus rhythm but the voltage on this is very  decreased.  There are Q waves inferiorly which may indicate a previous  inferior myocardial infarction and there is a suggestion of a right bundle  branch block.  Abnormal electrocardiogram.      ___________________________________________                                            Oneal Deputy. Juanetta Gosling, M.D.   ELH/MEDQ  D:  02/11/2003  T:  02/12/2003  Job:  [id number]

## 2010-07-12 ENCOUNTER — Other Ambulatory Visit: Payer: Self-pay | Admitting: Family Medicine

## 2010-09-08 ENCOUNTER — Other Ambulatory Visit: Payer: Self-pay

## 2010-09-08 MED ORDER — TRAZODONE HCL 150 MG PO TABS: ORAL_TABLET | ORAL | Status: DC

## 2010-09-08 MED ORDER — DULOXETINE HCL 60 MG PO CPEP: 60.0000 mg | ORAL_CAPSULE | Freq: Every day | ORAL | Status: DC

## 2010-09-16 ENCOUNTER — Encounter: Payer: Self-pay | Admitting: Family Medicine

## 2010-09-16 ENCOUNTER — Telehealth: Payer: Self-pay | Admitting: Family Medicine

## 2010-09-16 ENCOUNTER — Other Ambulatory Visit: Payer: Self-pay

## 2010-09-16 ENCOUNTER — Ambulatory Visit (INDEPENDENT_AMBULATORY_CARE_PROVIDER_SITE_OTHER): Payer: Medicare Other | Admitting: Family Medicine

## 2010-09-16 VITALS — BP 130/70 | HR 82 | Resp 16 | Ht 64.0 in | Wt 169.0 lb

## 2010-09-16 DIAGNOSIS — IMO0001 Reserved for inherently not codable concepts without codable children: Secondary | ICD-10-CM

## 2010-09-16 DIAGNOSIS — L0291 Cutaneous abscess, unspecified: Secondary | ICD-10-CM

## 2010-09-16 DIAGNOSIS — L0390 Cellulitis, unspecified: Secondary | ICD-10-CM | POA: Insufficient documentation

## 2010-09-16 DIAGNOSIS — I10 Essential (primary) hypertension: Secondary | ICD-10-CM

## 2010-09-16 MED ORDER — CEPHALEXIN 250 MG PO CAPS: 250.0000 mg | ORAL_CAPSULE | Freq: Four times a day (QID) | ORAL | Status: AC

## 2010-09-16 MED ORDER — DULOXETINE HCL 60 MG PO CPEP: 60.0000 mg | ORAL_CAPSULE | Freq: Two times a day (BID) | ORAL | Status: DC

## 2010-09-16 MED ORDER — FLUCONAZOLE 150 MG PO TABS: 150.0000 mg | ORAL_TABLET | Freq: Once | ORAL | Status: AC

## 2010-09-16 NOTE — Patient Instructions (Signed)
F/u as before   You are being treated for cellulitis of the left leg. Please take all the antibiotics, as prescribed.

## 2010-09-16 NOTE — Telephone Encounter (Signed)
Cymbalta sent in for bid and she was seen in office today

## 2010-09-16 NOTE — Progress Notes (Signed)
  Subjective:    Patient ID: Andrea Santiago, female    DOB: [birth year], 70 y.o.   MRN: [id number]  HPI Pt had a thorn get into he left leg approx 10 days, swollen and red last night, tender , painful, no drainage noted, no fever or chills. She has otherwise been well, with no new concerns  Review of Systems Denies recent fever or chills. Denies sinus pressure, nasal congestion, ear pain or sore throat. Denies chest congestion, productive cough or wheezing. Denies chest pains, palpitations and leg swelling Denies abdominal pain, nausea, vomiting,diarrhea or constipation. Had a virus with nausea and vomiting earlier this week         Objective:   Physical Exam Patient alert and oriented and in no cardiopulmonary distress.  HEENT: No facial asymmetry, EOMI, no sinus tenderness,  oropharynx pink and moist.  Neck supple no adenopathy.  Chest: Clear to auscultation bilaterally.  CVS: S1, S2 no murmurs, no S3.  ABD: Soft non tender. Bowel sounds normal.  Ext: No edema  MS: decreased  ROM spine,adequate in  shoulders, hips and knees.  Skin: Erythema, ulceration and warmth on leg, no purulent drainage noted  Psych: Good eye contact, normal affect. Memory intact not anxious or depressed appearing.  CNS: CN 2-12 intact, power, tone and sensation normal throughout.        Assessment & Plan:   No problem-specific assessment & plan notes found for this encounter.

## 2010-09-17 NOTE — Assessment & Plan Note (Addendum)
.  antibiotics prescribed, pt advised to keep area clean and dry

## 2010-09-17 NOTE — Assessment & Plan Note (Signed)
No current acute flare, continue meds as before

## 2010-09-17 NOTE — Assessment & Plan Note (Signed)
Controlled, no change in medication  

## 2010-09-28 LAB — BASIC METABOLIC PANEL
BUN: 21 mg/dL (ref 6–23)
CO2: 26 mEq/L (ref 19–32)
Calcium: 10 mg/dL (ref 8.4–10.5)
Chloride: 102 mEq/L (ref 96–112)
Creat: 0.98 mg/dL (ref 0.50–1.10)
Glucose, Bld: 110 mg/dL — ABNORMAL HIGH (ref 70–99)
Potassium: 4.1 mEq/L (ref 3.5–5.3)
Sodium: 139 mEq/L (ref 135–145)

## 2010-09-28 LAB — HEPATIC FUNCTION PANEL
ALT: 24 U/L (ref 0–35)
AST: 27 U/L (ref 0–37)
Albumin: 4.4 g/dL (ref 3.5–5.2)
Alkaline Phosphatase: 41 U/L (ref 39–117)
Bilirubin, Direct: 0.1 mg/dL (ref 0.0–0.3)
Indirect Bilirubin: 0.4 mg/dL (ref 0.0–0.9)
Total Bilirubin: 0.5 mg/dL (ref 0.3–1.2)
Total Protein: 7.2 g/dL (ref 6.0–8.3)

## 2010-09-28 LAB — LIPID PANEL
Cholesterol: 208 mg/dL — ABNORMAL HIGH (ref 0–200)
HDL: 54 mg/dL (ref >39)
LDL Cholesterol: 121 mg/dL — ABNORMAL HIGH (ref 0–99)
Total CHOL/HDL Ratio: 3.9 Ratio
Triglycerides: 166 mg/dL — ABNORMAL HIGH (ref <150)
VLDL: 33 mg/dL (ref 0–40)

## 2010-09-28 LAB — HEMOGLOBIN A1C
Hgb A1c MFr Bld: 6.5 % — ABNORMAL HIGH (ref <5.7)
Mean Plasma Glucose: 140 mg/dL — ABNORMAL HIGH (ref <117)

## 2010-09-30 ENCOUNTER — Other Ambulatory Visit: Payer: Self-pay | Admitting: Family Medicine

## 2010-10-14 ENCOUNTER — Encounter: Payer: Self-pay | Admitting: Family Medicine

## 2010-10-17 ENCOUNTER — Ambulatory Visit: Payer: Medicare Other | Admitting: Family Medicine

## 2010-10-31 ENCOUNTER — Encounter: Payer: Self-pay | Admitting: Family Medicine

## 2010-11-01 ENCOUNTER — Ambulatory Visit (INDEPENDENT_AMBULATORY_CARE_PROVIDER_SITE_OTHER): Payer: Medicare Other | Admitting: Family Medicine

## 2010-11-01 ENCOUNTER — Encounter: Payer: Self-pay | Admitting: Family Medicine

## 2010-11-01 ENCOUNTER — Other Ambulatory Visit: Payer: Self-pay | Admitting: Family Medicine

## 2010-11-01 VITALS — BP 130/80 | HR 70 | Resp 16 | Ht 64.5 in | Wt 173.0 lb

## 2010-11-01 DIAGNOSIS — E785 Hyperlipidemia, unspecified: Secondary | ICD-10-CM

## 2010-11-01 DIAGNOSIS — M797 Fibromyalgia: Secondary | ICD-10-CM

## 2010-11-01 DIAGNOSIS — R5383 Other fatigue: Secondary | ICD-10-CM

## 2010-11-01 DIAGNOSIS — F329 Major depressive disorder, single episode, unspecified: Secondary | ICD-10-CM

## 2010-11-01 DIAGNOSIS — Z23 Encounter for immunization: Secondary | ICD-10-CM

## 2010-11-01 DIAGNOSIS — R7303 Prediabetes: Secondary | ICD-10-CM

## 2010-11-01 DIAGNOSIS — R5381 Other malaise: Secondary | ICD-10-CM

## 2010-11-01 DIAGNOSIS — R7309 Other abnormal glucose: Secondary | ICD-10-CM

## 2010-11-01 DIAGNOSIS — IMO0001 Reserved for inherently not codable concepts without codable children: Secondary | ICD-10-CM

## 2010-11-01 DIAGNOSIS — F3289 Other specified depressive episodes: Secondary | ICD-10-CM

## 2010-11-01 DIAGNOSIS — R7301 Impaired fasting glucose: Secondary | ICD-10-CM

## 2010-11-01 DIAGNOSIS — I10 Essential (primary) hypertension: Secondary | ICD-10-CM

## 2010-11-01 MED ORDER — INFLUENZA VAC TYPES A & B PF IM SUSP: 0.5000 mL | Freq: Once | INTRAMUSCULAR | Status: DC

## 2010-11-01 NOTE — Patient Instructions (Addendum)
F/U in 2nd week in December.  You need to go  To the diabetic class please call and schedule, this will help prevent you from getting diabetes.   It is important that you exercise regularly at least 30 minutes 5 times a week. If you develop chest pain, have severe difficulty breathing, or feel very tired, stop exercising immediately and seek medical attention    A healthy diet is rich in fruit, vegetables and whole grains. Poultry fish, nuts and beans are a healthy choice for protein rather then red meat. A low sodium diet and drinking 64 ounces of water daily is generally recommended. Oils and sweet should be limited. Carbohydrates especially for those who are diabetic or overweight, should be limited to 30-45 gram per meal. It is important to eat on a regular schedule, at least 3 times daily. Snacks should be primarily fruits, vegetables or nuts.   Fasting labs in December before visit LABWORK  NEEDS TO BE DONE BETWEEN 3 TO 7 DAYS BEFORE YOUR NEXT SCEDULED  VISIT.  THIS WILL IMPROVE THE QUALITY OF YOUR CARE.    Your mammogram is due in mid October , please schedule

## 2010-11-07 NOTE — Assessment & Plan Note (Signed)
The importance of low carb diet and weight loss is stressed, pt has one qualifying HBA1C for diabetes, she is to attend class, she is determined to reverse this with no medication, will give her an additional 3 months to do so

## 2010-11-07 NOTE — Assessment & Plan Note (Signed)
Unchanged, and controlled on current med

## 2010-11-07 NOTE — Assessment & Plan Note (Signed)
Controlled, no change in medication  

## 2010-11-07 NOTE — Progress Notes (Signed)
  Subjective:    Patient ID: Andrea Santiago, female    DOB: Jun 27, 1940, 70 y.o.   MRN: [id number]  HPI The PT is here for follow up and re-evaluation of chronic medical conditions, medication management and review of any available recent lab and radiology data.  Preventive health is updated, specifically  Cancer screening and Immunization.   Questions or concerns regarding consultations or procedures which the PT has had in the interim are  addressed. The PT denies any adverse reactions to current medications since the last visit.  There are no new concerns.  There are no specific complaints       Review of Systems See HPI Denies recent fever or chills. Denies sinus pressure, nasal congestion, ear pain or sore throat. Denies chest congestion, productive cough or wheezing. Denies chest pains, palpitations and leg swelling Denies abdominal pain, nausea, vomiting,diarrhea or constipation.   Denies dysuria, frequency, hesitancy or incontinence. Chronic joint and muscle pain and stiffness with reduced mobility. Denies headaches, seizures, numbness, or tingling. Denies uncontrolled  depression, anxiety or insomnia. Denies skin break down or rash.        Objective:   Physical Exam Patient alert and oriented and in no cardiopulmonary distress.  HEENT: No facial asymmetry, EOMI, no sinus tenderness,  oropharynx pink and moist.  Neck supple no adenopathy.  Chest: Clear to auscultation bilaterally.  CVS: S1, S2 no murmurs, no S3.  ABD: Soft non tender. Bowel sounds normal.  Ext: No edema  MS: decreased  ROM spine, shoulders, hips and knees.  Skin: Intact, no ulcerations or rash noted.  Psych: Good eye contact, normal affect. Memory intact not anxious or depressed appearing.  CNS: CN 2-12 intact, power, tone and sensation normal throughout.        Assessment & Plan:

## 2010-11-07 NOTE — Assessment & Plan Note (Signed)
Uncontrolled, low fat diet discussed and encouraged 

## 2010-12-03 ENCOUNTER — Other Ambulatory Visit: Payer: Self-pay | Admitting: Family Medicine

## 2010-12-26 ENCOUNTER — Ambulatory Visit: Payer: Medicare Other

## 2011-01-17 ENCOUNTER — Encounter: Payer: Self-pay | Admitting: Family Medicine

## 2011-01-17 ENCOUNTER — Ambulatory Visit (INDEPENDENT_AMBULATORY_CARE_PROVIDER_SITE_OTHER): Payer: Medicare Other | Admitting: Family Medicine

## 2011-01-17 VITALS — BP 124/60 | HR 75 | Temp 97.9°F | Resp 16 | Ht 64.5 in | Wt 162.1 lb

## 2011-01-17 DIAGNOSIS — I10 Essential (primary) hypertension: Secondary | ICD-10-CM

## 2011-01-17 DIAGNOSIS — M549 Dorsalgia, unspecified: Secondary | ICD-10-CM

## 2011-01-17 DIAGNOSIS — J209 Acute bronchitis, unspecified: Secondary | ICD-10-CM | POA: Insufficient documentation

## 2011-01-17 DIAGNOSIS — J0100 Acute maxillary sinusitis, unspecified: Secondary | ICD-10-CM

## 2011-01-17 DIAGNOSIS — J40 Bronchitis, not specified as acute or chronic: Secondary | ICD-10-CM

## 2011-01-17 DIAGNOSIS — IMO0001 Reserved for inherently not codable concepts without codable children: Secondary | ICD-10-CM

## 2011-01-17 MED ORDER — METHYLPREDNISOLONE ACETATE PF 80 MG/ML IJ SUSP
80.0000 mg | Freq: Once | INTRAMUSCULAR | Status: AC
  Administered 2011-01-17: 80 mg via INTRAMUSCULAR

## 2011-01-17 MED ORDER — CEFTRIAXONE SODIUM 500 MG IJ SOLR
500.0000 mg | Freq: Once | INTRAMUSCULAR | Status: AC
  Administered 2011-01-17: 500 mg via INTRAMUSCULAR

## 2011-01-17 MED ORDER — SULFAMETHOXAZOLE-TRIMETHOPRIM 800-160 MG PO TABS: 1.0000 | ORAL_TABLET | Freq: Two times a day (BID) | ORAL | Status: AC

## 2011-01-17 MED ORDER — PROMETHAZINE-DM 6.25-15 MG/5ML PO SYRP: ORAL_SOLUTION | ORAL | Status: DC

## 2011-01-17 MED ORDER — BENZONATATE 100 MG PO CAPS: 100.0000 mg | ORAL_CAPSULE | Freq: Four times a day (QID) | ORAL | Status: DC | PRN

## 2011-01-17 NOTE — Assessment & Plan Note (Signed)
Increased generalized pain x 5 days, toradol administered

## 2011-01-17 NOTE — Patient Instructions (Signed)
F/U as before.  You are being treated for right maxillary sinus infection, and also for bronchitis.  Rocephin and depomedrol are administered in the office , antibiotics, decongestant , and cough suppressant prescribed. Torodol in toe office for pain.  Please ensure you drink a lot of fluids and get plenty of rest

## 2011-01-18 ENCOUNTER — Ambulatory Visit: Payer: Medicare Other | Admitting: Family Medicine

## 2011-01-22 NOTE — Assessment & Plan Note (Signed)
Increased with excessive cough and acute illness, toradol administered

## 2011-01-22 NOTE — Assessment & Plan Note (Signed)
Acute bronchitis, antibiotics and decongestants prescribed

## 2011-01-22 NOTE — Assessment & Plan Note (Signed)
Controlled, no change in medication  

## 2011-01-22 NOTE — Assessment & Plan Note (Signed)
Acute infection, rocephin in office and 10 day course of antibiotics prescribed

## 2011-01-22 NOTE — Progress Notes (Signed)
  Subjective:    Patient ID: Andrea Santiago, female    DOB: [birth year], 70 y.o.   MRN: [id number]  HPI 4 day h/o head and chest congestion, chills and intermittent fever, yellow green nasal drainage and sputum, fatigue, malaise, generalized pain and increased back pain. Prior to this had been stable and doing well   Review of Systems See HPI  Denies chest pains, palpitations and leg swelling Denies abdominal pain, nausea, vomiting,diarrhea or constipation.   Denies dysuria, frequency, hesitancy or incontinence. Denies headaches, seizures, numbness, or tingling. Denies depression, anxiety or insomnia. Denies skin break down or rash.        Objective:   Physical Exam Patient alert and oriented and in no cardiopulmonary distress.Ill appearing  HEENT: No facial asymmetry, EOMI, maxillary sinus tenderness,  oropharynx pink and moist.  Neck decreased ROM , bilateral anterior  adenopathy.  Chest:decreased air entry, bilateral crackles few scattered wheezes  CVS: S1, S2 no murmurs, no S3.  ABD: Soft non tender. Bowel sounds normal.  Ext: No edema  MS: decreased  ROM spine, adequate shoulders, hips and knees.Generalized muscle tenderness  Skin: Intact, no ulcerations or rash noted.  Psych: Good eye contact, normal affect. Memory intact not anxious or depressed appearing.  CNS: CN 2-12 intact, power, tone and sensation normal throughout.        Assessment & Plan:

## 2011-01-25 ENCOUNTER — Encounter: Payer: Self-pay | Admitting: Family Medicine

## 2011-01-28 LAB — LIPID PANEL
Cholesterol: 173 mg/dL (ref 0–200)
HDL: 59 mg/dL (ref >39)
LDL Cholesterol: 100 mg/dL — ABNORMAL HIGH (ref 0–99)
Total CHOL/HDL Ratio: 2.9 Ratio
Triglycerides: 70 mg/dL (ref <150)
VLDL: 14 mg/dL (ref 0–40)

## 2011-01-28 LAB — HEPATIC FUNCTION PANEL
ALT: 33 U/L (ref 0–35)
AST: 34 U/L (ref 0–37)
Albumin: 4.6 g/dL (ref 3.5–5.2)
Alkaline Phosphatase: 35 U/L — ABNORMAL LOW (ref 39–117)
Bilirubin, Direct: 0.1 mg/dL (ref 0.0–0.3)
Indirect Bilirubin: 0.4 mg/dL (ref 0.0–0.9)
Total Bilirubin: 0.5 mg/dL (ref 0.3–1.2)
Total Protein: 7.6 g/dL (ref 6.0–8.3)

## 2011-01-28 LAB — BASIC METABOLIC PANEL
BUN: 21 mg/dL (ref 6–23)
CO2: 26 mEq/L (ref 19–32)
Calcium: 10 mg/dL (ref 8.4–10.5)
Chloride: 105 mEq/L (ref 96–112)
Creat: 1.14 mg/dL — ABNORMAL HIGH (ref 0.50–1.10)
Glucose, Bld: 93 mg/dL (ref 70–99)
Potassium: 4.1 mEq/L (ref 3.5–5.3)
Sodium: 141 mEq/L (ref 135–145)

## 2011-01-28 LAB — CBC WITH DIFFERENTIAL/PLATELET
Basophils Absolute: 0 10*3/uL (ref 0.0–0.1)
Basophils Relative: 0 % (ref 0–1)
Eosinophils Absolute: 0.1 10*3/uL (ref 0.0–0.7)
Eosinophils Relative: 1 % (ref 0–5)
HCT: 39.4 % (ref 36.0–46.0)
Hemoglobin: 13.2 g/dL (ref 12.0–15.0)
Lymphocytes Relative: 43 % (ref 12–46)
Lymphs Abs: 2.8 10*3/uL (ref 0.7–4.0)
MCH: 31.4 pg (ref 26.0–34.0)
MCHC: 33.5 g/dL (ref 30.0–36.0)
MCV: 93.6 fL (ref 78.0–100.0)
Monocytes Absolute: 0.7 10*3/uL (ref 0.1–1.0)
Monocytes Relative: 10 % (ref 3–12)
Neutro Abs: 2.9 10*3/uL (ref 1.7–7.7)
Neutrophils Relative %: 45 % (ref 43–77)
Platelets: 306 10*3/uL (ref 150–400)
RBC: 4.21 MIL/uL (ref 3.87–5.11)
RDW: 14.6 % (ref 11.5–15.5)
WBC: 6.5 10*3/uL (ref 4.0–10.5)

## 2011-01-28 LAB — TSH: TSH: 5.001 u[IU]/mL — ABNORMAL HIGH (ref 0.350–4.500)

## 2011-01-29 LAB — HEMOGLOBIN A1C
Hgb A1c MFr Bld: 6 % — ABNORMAL HIGH (ref <5.7)
Mean Plasma Glucose: 126 mg/dL — ABNORMAL HIGH (ref <117)

## 2011-01-30 ENCOUNTER — Other Ambulatory Visit: Payer: Self-pay | Admitting: Family Medicine

## 2011-01-30 ENCOUNTER — Ambulatory Visit (INDEPENDENT_AMBULATORY_CARE_PROVIDER_SITE_OTHER): Payer: Medicare Other | Admitting: Family Medicine

## 2011-01-30 ENCOUNTER — Encounter: Payer: Self-pay | Admitting: Family Medicine

## 2011-01-30 DIAGNOSIS — R7989 Other specified abnormal findings of blood chemistry: Secondary | ICD-10-CM | POA: Insufficient documentation

## 2011-01-30 DIAGNOSIS — R6889 Other general symptoms and signs: Secondary | ICD-10-CM

## 2011-01-30 DIAGNOSIS — Z139 Encounter for screening, unspecified: Secondary | ICD-10-CM

## 2011-01-30 DIAGNOSIS — F3289 Other specified depressive episodes: Secondary | ICD-10-CM

## 2011-01-30 DIAGNOSIS — F329 Major depressive disorder, single episode, unspecified: Secondary | ICD-10-CM

## 2011-01-30 DIAGNOSIS — E663 Overweight: Secondary | ICD-10-CM

## 2011-01-30 DIAGNOSIS — R5381 Other malaise: Secondary | ICD-10-CM

## 2011-01-30 DIAGNOSIS — R079 Chest pain, unspecified: Secondary | ICD-10-CM | POA: Insufficient documentation

## 2011-01-30 DIAGNOSIS — I10 Essential (primary) hypertension: Secondary | ICD-10-CM

## 2011-01-30 DIAGNOSIS — E039 Hypothyroidism, unspecified: Secondary | ICD-10-CM | POA: Insufficient documentation

## 2011-01-30 DIAGNOSIS — Z23 Encounter for immunization: Secondary | ICD-10-CM

## 2011-01-30 DIAGNOSIS — H8109 Meniere's disease, unspecified ear: Secondary | ICD-10-CM

## 2011-01-30 DIAGNOSIS — Z2911 Encounter for prophylactic immunotherapy for respiratory syncytial virus (RSV): Secondary | ICD-10-CM

## 2011-01-30 DIAGNOSIS — R7301 Impaired fasting glucose: Secondary | ICD-10-CM

## 2011-01-30 DIAGNOSIS — R5383 Other fatigue: Secondary | ICD-10-CM

## 2011-01-30 LAB — T3, FREE: T3, Free: 2.8 pg/mL (ref 2.3–4.2)

## 2011-01-30 LAB — T4, FREE: Free T4: 1.06 ng/dL (ref 0.80–1.80)

## 2011-01-30 MED ORDER — TIZANIDINE HCL 4 MG PO TABS: 4.0000 mg | ORAL_TABLET | Freq: Three times a day (TID) | ORAL | Status: DC

## 2011-01-30 MED ORDER — OMEPRAZOLE 20 MG PO CPDR: 40.0000 mg | DELAYED_RELEASE_CAPSULE | Freq: Every day | ORAL | Status: DC

## 2011-01-30 MED ORDER — DULOXETINE HCL 60 MG PO CPEP: 60.0000 mg | ORAL_CAPSULE | Freq: Two times a day (BID) | ORAL | Status: DC

## 2011-01-30 NOTE — Progress Notes (Signed)
  Subjective:    Patient ID: Andrea Santiago, female    DOB: 11/08/1940, 70 y.o.   MRN: [id number]  HPI 3 month gh/o intermittent right anterior chest pain on avg 3 times per week,non radiaiting, no aggravating or relieving factors duration approx 30 mins,mamogram overdue, pain non radiating, no other symptoms Increased stress due to illness in her family She reports complete resolution of respiratory symptoms   Review of Systems    See HPI Denies recent fever or chills. Denies sinus pressure, nasal congestion, ear pain or sore throat. Denies chest congestion, productive cough or wheezing.  Denies abdominal pain, nausea, vomiting,diarrhea or constipation.   Denies dysuria, frequency, hesitancy or incontinence. Chronic  joint pain,  and limitation in mobility. Denies headaches, seizures, numbness, or tingling. Denies uncontrolled  depression, anxiety or insomnia. Denies skin break down or rash.     Objective:   Physical Exam  Patient alert and oriented and in no cardiopulmonary distress.  HEENT: No facial asymmetry, EOMI, no sinus tenderness,  oropharynx pink and moist.  Neck supple no adenopathy.  Chest: Clear to auscultation bilaterally.  CVS: S1, S2 no murmurs, no S3.  ABD: Soft non tender. Bowel sounds normal.  Ext: No edema  MS: Adequate though reduced  ROM spine, shoulders, hips and knees.  Skin: Intact, no ulcerations or rash noted.  Psych: Good eye contact, normal affect. Memory intact not anxious or depressed appearing.  CNS: CN 2-12 intact, power, tone and sensation normal throughout.       Assessment & Plan:

## 2011-01-30 NOTE — Patient Instructions (Addendum)
F/u in 4 months.  HBA1C  And TSH in 4 months. And chem 7  You are referred for thyroid US since your gland is not functioning as well as it should be   We will call with results and you will likely start a low dose of thyroid medication   EKG today for right chest pain, and your mammogram is to be scheduled before you leave since overdue

## 2011-01-31 NOTE — Assessment & Plan Note (Signed)
Improved HBA1C pt applauded on lifestyle modification and encouraged to continue same

## 2011-01-31 NOTE — Assessment & Plan Note (Signed)
Improved. Pt applauded on succesful weight loss through lifestyle change, and encouraged to continue same. Weight loss goal set for the next several months.  

## 2011-01-31 NOTE — Assessment & Plan Note (Signed)
Controlled, no change in medication  

## 2011-01-31 NOTE — Assessment & Plan Note (Signed)
Improved and stable on current medication 

## 2011-01-31 NOTE — Assessment & Plan Note (Signed)
History is atypical of CAD, and office EKG normal, pt reassured

## 2011-02-02 ENCOUNTER — Other Ambulatory Visit: Payer: Self-pay | Admitting: Family Medicine

## 2011-02-06 ENCOUNTER — Ambulatory Visit (HOSPITAL_COMMUNITY)
Admission: RE | Admit: 2011-02-06 | Discharge: 2011-02-06 | Disposition: A | Discharge status: Home / Self Care | Payer: Medicare Other | Source: Ambulatory Visit | Attending: Family Medicine | Admitting: Family Medicine

## 2011-02-06 ENCOUNTER — Encounter: Payer: Self-pay | Admitting: Family Medicine

## 2011-02-06 ENCOUNTER — Other Ambulatory Visit (HOSPITAL_BASED_OUTPATIENT_CLINIC_OR_DEPARTMENT_OTHER): Payer: Self-pay | Admitting: Internal Medicine

## 2011-02-06 DIAGNOSIS — Z139 Encounter for screening, unspecified: Secondary | ICD-10-CM

## 2011-02-06 DIAGNOSIS — Z1231 Encounter for screening mammogram for malignant neoplasm of breast: Secondary | ICD-10-CM | POA: Insufficient documentation

## 2011-02-07 ENCOUNTER — Ambulatory Visit (HOSPITAL_COMMUNITY): Payer: Medicare Other

## 2011-02-16 ENCOUNTER — Ambulatory Visit (HOSPITAL_COMMUNITY): Payer: Medicare Other

## 2011-02-27 ENCOUNTER — Ambulatory Visit (HOSPITAL_COMMUNITY)
Admission: RE | Admit: 2011-02-27 | Discharge: 2011-02-27 | Disposition: A | Discharge status: Home / Self Care | Payer: Medicare Other | Source: Ambulatory Visit | Attending: Family Medicine | Admitting: Family Medicine

## 2011-02-27 DIAGNOSIS — E349 Endocrine disorder, unspecified: Secondary | ICD-10-CM | POA: Insufficient documentation

## 2011-02-27 DIAGNOSIS — R7989 Other specified abnormal findings of blood chemistry: Secondary | ICD-10-CM

## 2011-02-27 DIAGNOSIS — E041 Nontoxic single thyroid nodule: Secondary | ICD-10-CM | POA: Diagnosis not present

## 2011-02-27 DIAGNOSIS — E042 Nontoxic multinodular goiter: Secondary | ICD-10-CM | POA: Diagnosis not present

## 2011-03-08 ENCOUNTER — Telehealth: Payer: Self-pay | Admitting: Family Medicine

## 2011-03-08 NOTE — Telephone Encounter (Signed)
Spoke with pt and she is aware of [place] results.

## 2011-04-06 ENCOUNTER — Other Ambulatory Visit: Payer: Self-pay | Admitting: Family Medicine

## 2011-04-22 ENCOUNTER — Other Ambulatory Visit: Payer: Self-pay | Admitting: Family Medicine

## 2011-05-29 ENCOUNTER — Other Ambulatory Visit: Payer: Self-pay | Admitting: Family Medicine

## 2011-05-29 ENCOUNTER — Ambulatory Visit: Payer: Medicare Other | Admitting: Family Medicine

## 2011-06-05 ENCOUNTER — Telehealth: Payer: Self-pay | Admitting: Family Medicine

## 2011-06-05 NOTE — Telephone Encounter (Signed)
Lab order faxed to lab. 

## 2011-06-08 ENCOUNTER — Other Ambulatory Visit: Payer: Self-pay | Admitting: Family Medicine

## 2011-06-08 DIAGNOSIS — R7301 Impaired fasting glucose: Secondary | ICD-10-CM | POA: Diagnosis not present

## 2011-06-08 DIAGNOSIS — I10 Essential (primary) hypertension: Secondary | ICD-10-CM | POA: Diagnosis not present

## 2011-06-08 DIAGNOSIS — E785 Hyperlipidemia, unspecified: Secondary | ICD-10-CM | POA: Diagnosis not present

## 2011-06-08 DIAGNOSIS — R079 Chest pain, unspecified: Secondary | ICD-10-CM | POA: Diagnosis not present

## 2011-06-08 DIAGNOSIS — R6889 Other general symptoms and signs: Secondary | ICD-10-CM | POA: Diagnosis not present

## 2011-06-09 LAB — COMPREHENSIVE METABOLIC PANEL
ALT: 29 U/L (ref 0–35)
AST: 30 U/L (ref 0–37)
Albumin: 4.8 g/dL (ref 3.5–5.2)
Alkaline Phosphatase: 37 U/L — ABNORMAL LOW (ref 39–117)
BUN: 20 mg/dL (ref 6–23)
CO2: 28 mEq/L (ref 19–32)
Calcium: 10 mg/dL (ref 8.4–10.5)
Chloride: 101 mEq/L (ref 96–112)
Creat: 0.94 mg/dL (ref 0.50–1.10)
Glucose, Bld: 110 mg/dL — ABNORMAL HIGH (ref 70–99)
Potassium: 3.7 mEq/L (ref 3.5–5.3)
Sodium: 141 mEq/L (ref 135–145)
Total Bilirubin: 0.4 mg/dL (ref 0.3–1.2)
Total Protein: 7.7 g/dL (ref 6.0–8.3)

## 2011-06-09 LAB — HEMOGLOBIN A1C
Hgb A1c MFr Bld: 6.3 % — ABNORMAL HIGH (ref <5.7)
Mean Plasma Glucose: 134 mg/dL — ABNORMAL HIGH (ref <117)

## 2011-06-09 LAB — TSH: TSH: 3.861 u[IU]/mL (ref 0.350–4.500)

## 2011-06-12 ENCOUNTER — Ambulatory Visit (INDEPENDENT_AMBULATORY_CARE_PROVIDER_SITE_OTHER): Payer: Medicare Other | Admitting: Family Medicine

## 2011-06-12 ENCOUNTER — Encounter: Payer: Self-pay | Admitting: Family Medicine

## 2011-06-12 VITALS — BP 116/74 | HR 64 | Resp 16 | Ht 64.5 in | Wt 162.1 lb

## 2011-06-12 DIAGNOSIS — E739 Lactose intolerance, unspecified: Secondary | ICD-10-CM

## 2011-06-12 DIAGNOSIS — IMO0001 Reserved for inherently not codable concepts without codable children: Secondary | ICD-10-CM | POA: Diagnosis not present

## 2011-06-12 DIAGNOSIS — R7301 Impaired fasting glucose: Secondary | ICD-10-CM

## 2011-06-12 DIAGNOSIS — F329 Major depressive disorder, single episode, unspecified: Secondary | ICD-10-CM

## 2011-06-12 DIAGNOSIS — I10 Essential (primary) hypertension: Secondary | ICD-10-CM | POA: Diagnosis not present

## 2011-06-12 DIAGNOSIS — K219 Gastro-esophageal reflux disease without esophagitis: Secondary | ICD-10-CM

## 2011-06-12 DIAGNOSIS — J309 Allergic rhinitis, unspecified: Secondary | ICD-10-CM | POA: Insufficient documentation

## 2011-06-12 DIAGNOSIS — E663 Overweight: Secondary | ICD-10-CM

## 2011-06-12 DIAGNOSIS — S0990XA Unspecified injury of head, initial encounter: Secondary | ICD-10-CM | POA: Diagnosis not present

## 2011-06-12 DIAGNOSIS — M62838 Other muscle spasm: Secondary | ICD-10-CM | POA: Insufficient documentation

## 2011-06-12 DIAGNOSIS — E785 Hyperlipidemia, unspecified: Secondary | ICD-10-CM

## 2011-06-12 LAB — LIPID PANEL
Cholesterol: 201 mg/dL — ABNORMAL HIGH (ref 0–200)
HDL: 56 mg/dL (ref >39)
LDL Cholesterol: 116 mg/dL — ABNORMAL HIGH (ref 0–99)
Total CHOL/HDL Ratio: 3.6 Ratio
Triglycerides: 146 mg/dL (ref <150)
VLDL: 29 mg/dL (ref 0–40)

## 2011-06-12 MED ORDER — TIZANIDINE HCL 4 MG PO CAPS: 4.0000 mg | ORAL_CAPSULE | Freq: Three times a day (TID) | ORAL | Status: DC

## 2011-06-12 MED ORDER — LORATADINE 10 MG PO TABS: 10.0000 mg | ORAL_TABLET | Freq: Every day | ORAL | Status: DC

## 2011-06-12 NOTE — Patient Instructions (Addendum)
F/u in 4.5 month  Increase in dose of zanaflex to 3 times daily.  Please commit to daily exercise on your Sarina Ser, this will help you.  We will let you know when   your colonoscopy is next due, I think in 2015, not quite sure at this time.  Pls cut back on carbs and sweets and continue to lose weight, your blood sugars have increased  HBA1C in 4.5 month , before next visit  You will get a script for loratidine, also a sample of a saline nasal wash for use to help with nasal congestion from allergies

## 2011-06-12 NOTE — Progress Notes (Signed)
  Subjective:    Patient ID: Andrea Santiago, female    DOB: 1940-08-01, 71 y.o.   MRN: [id number]  HPI The PT is here for follow up and re-evaluation of chronic medical conditions, medication management and review of any available recent lab and radiology data.  Preventive health is updated, specifically  Cancer screening and Immunization.   Questions or concerns regarding consultations or procedures which the PT has had in the interim are  addressed. The PT denies any adverse reactions to current medications since the last visit.  There are no new concerns.  There are no specific complaints  Hit her head in a fall several weeks ago and has been experiencing numbness since that time    Review of Systems See HPI Denies recent fever or chills. Denies sinus pressure,  ear pain or sore throat.c/o increased nasal stuffines and post nasal drainage which is clear Denies chest congestion, productive cough or wheezing. Denies chest pains, palpitations and leg swelling Denies abdominal pain, nausea, vomiting,diarrhea or constipation.   Denies dysuria, frequency, hesitancy or incontinence. Chronic joint and muscle pain with increased muscle spasm Denies depression, anxiety or insomnia. Denies skin break down or rash.        Objective:   Physical Exam  Patient alert and oriented and in no cardiopulmonary distress.  HEENT: No facial asymmetry, EOMI, no sinus tenderness,  oropharynx pink and moist.  Neck decreased ROM, no adenopathy.  Chest: Clear to auscultation bilaterally.  CVS: S1, S2 no murmurs, no S3.  ABD: Soft non tender. Bowel sounds normal.  Ext: No edema  MS: decreased  ROM spine, shoulders, hips and knees.  Skin: Intact, no ulcerations or rash noted.  Psych: Good eye contact, normal affect. Memory intact not anxious or depressed appearing.  CNS: CN 2-12 intact, power, tone and sensation normal throughout.       Assessment & Plan:

## 2011-06-14 ENCOUNTER — Ambulatory Visit (HOSPITAL_COMMUNITY)
Admission: RE | Admit: 2011-06-14 | Discharge: 2011-06-14 | Disposition: A | Discharge status: Home / Self Care | Payer: Medicare Other | Source: Ambulatory Visit | Attending: Family Medicine | Admitting: Family Medicine

## 2011-06-14 DIAGNOSIS — R51 Headache: Secondary | ICD-10-CM | POA: Insufficient documentation

## 2011-06-14 DIAGNOSIS — R269 Unspecified abnormalities of gait and mobility: Secondary | ICD-10-CM | POA: Diagnosis not present

## 2011-06-14 DIAGNOSIS — W19XXXA Unspecified fall, initial encounter: Secondary | ICD-10-CM | POA: Insufficient documentation

## 2011-06-14 DIAGNOSIS — S0990XA Unspecified injury of head, initial encounter: Secondary | ICD-10-CM | POA: Insufficient documentation

## 2011-06-14 DIAGNOSIS — I6789 Other cerebrovascular disease: Secondary | ICD-10-CM | POA: Diagnosis not present

## 2011-06-14 DIAGNOSIS — G319 Degenerative disease of nervous system, unspecified: Secondary | ICD-10-CM | POA: Diagnosis not present

## 2011-06-28 ENCOUNTER — Other Ambulatory Visit: Payer: Self-pay | Admitting: Family Medicine

## 2011-06-30 ENCOUNTER — Other Ambulatory Visit: Payer: Self-pay

## 2011-06-30 MED ORDER — CHOLINE FENOFIBRATE 135 MG PO CPDR: DELAYED_RELEASE_CAPSULE | ORAL | Status: DC

## 2011-06-30 MED ORDER — DULOXETINE HCL 60 MG PO CPEP: 60.0000 mg | ORAL_CAPSULE | Freq: Two times a day (BID) | ORAL | Status: DC

## 2011-06-30 MED ORDER — HYDROCHLOROTHIAZIDE 25 MG PO TABS: ORAL_TABLET | ORAL | Status: DC

## 2011-07-05 NOTE — Assessment & Plan Note (Signed)
Controlled, no change in medication  

## 2011-07-05 NOTE — Assessment & Plan Note (Signed)
Uncontrolled symptoms currently medication to be added

## 2011-07-05 NOTE — Assessment & Plan Note (Addendum)
Unchanged, continue current management experiencing inc spasm, wit poor sleep , inc zanaflex

## 2011-07-05 NOTE — Assessment & Plan Note (Addendum)
Normal neurologic exam, will order head CT  Continues to experience numbness with increased headache requests neurology eval

## 2011-07-05 NOTE — Assessment & Plan Note (Signed)
Improved but still uncontrolled, no med change, :Low fat diet discussed and encouraged.

## 2011-07-05 NOTE — Assessment & Plan Note (Signed)
Improved. Pt applauded on succesful weight loss through lifestyle change, and encouraged to continue same. Weight loss goal set for the next several months.  

## 2011-07-06 ENCOUNTER — Other Ambulatory Visit: Payer: Self-pay | Admitting: Family Medicine

## 2011-07-06 DIAGNOSIS — R51 Headache: Secondary | ICD-10-CM

## 2011-07-06 DIAGNOSIS — R200 Anesthesia of skin: Secondary | ICD-10-CM

## 2011-07-06 DIAGNOSIS — S0990XA Unspecified injury of head, initial encounter: Secondary | ICD-10-CM

## 2011-07-06 NOTE — Progress Notes (Signed)
  Subjective:    Patient ID: Andrea Santiago, female    DOB: [birth year], 71 y.o.   MRN: [id number]  HPI Pt reports she fell on 05/30/2011 while vacuuming in someone's house she tripped on a shag carpet, fell backward and hit the back of her head. No loss of conciousness or laceration, c/o numbness and increased headache since fall, no c/o memory loss. Wants referral to neurologist regarding these symptoms   Review of Systems     Objective:   Physical Exam        Assessment & Plan:

## 2011-07-11 ENCOUNTER — Telehealth: Payer: Self-pay | Admitting: Family Medicine

## 2011-07-11 NOTE — Telephone Encounter (Signed)
Looks like she has appt for 07/12/2011 with dr. Jeanice Lim

## 2011-07-11 NOTE — Telephone Encounter (Signed)
Can't do without OV. Please give first available

## 2011-07-11 NOTE — Telephone Encounter (Signed)
Called pt but only got answer machine.

## 2011-07-12 ENCOUNTER — Ambulatory Visit (INDEPENDENT_AMBULATORY_CARE_PROVIDER_SITE_OTHER): Payer: Medicare Other | Admitting: Family Medicine

## 2011-07-12 ENCOUNTER — Encounter: Payer: Self-pay | Admitting: Family Medicine

## 2011-07-12 VITALS — BP 120/70 | HR 60 | Resp 16 | Ht 64.5 in | Wt 160.1 lb

## 2011-07-12 DIAGNOSIS — W57XXXA Bitten or stung by nonvenomous insect and other nonvenomous arthropods, initial encounter: Secondary | ICD-10-CM | POA: Diagnosis not present

## 2011-07-12 DIAGNOSIS — L255 Unspecified contact dermatitis due to plants, except food: Secondary | ICD-10-CM | POA: Diagnosis not present

## 2011-07-12 DIAGNOSIS — T148XXA Other injury of unspecified body region, initial encounter: Secondary | ICD-10-CM | POA: Diagnosis not present

## 2011-07-12 DIAGNOSIS — T148 Other injury of unspecified body region: Secondary | ICD-10-CM | POA: Diagnosis not present

## 2011-07-12 MED ORDER — METHYLPREDNISOLONE SODIUM SUCC 125 MG IJ SOLR
125.0000 mg | Freq: Once | INTRAMUSCULAR | Status: AC
Start: 2011-07-12 — End: 2011-07-12
  Administered 2011-07-12: 125 mg via INTRAMUSCULAR

## 2011-07-12 MED ORDER — METHYLPREDNISOLONE 4 MG PO KIT: PACK | ORAL | Status: AC

## 2011-07-12 NOTE — Patient Instructions (Signed)
Start the prednisone tomorrow You have been given a shot of steroids for the poison oak Continue your claritin [facility] is a rash caused by touching the leaves of the poison oak plant. You may have a rash with redness and itching. Sometimes, blisters appear and break open. Your eyes may get puffy (swollen). Poison oak often heals in 2 to 3 weeks without treatment.   HOME CARE  If you touch poison oak:   Wash your skin with soap and water right away. Wash under your fingernails. Do not rub the skin very hard.   Wash any clothes you were wearing.   Avoid poison oak in the future. Poison oak usually has 3 leaves on a stem.   Use medicines to help with itching as told by your doctor. Do not drive when you take this medicine.   Keep open sores dry, clean, and covered with a bandage and medicated cream, if needed.   Ask your doctor about medicine for children.  GET HELP RIGHT AWAY IF:  You have open sores.   Redness spreads beyond the area of the rash.   There is yellowish white fluid (pus) coming from the rash.   Pain gets worse.   You have a temperature by mouth above 102 F (38.9 C), not controlled by medicine.  MAKE SURE YOU:  Understand these instructions.   Will watch your condition.   Will get help right away if you are not doing well or get worse.  Document Released: 03/11/2010 Document Revised: 01/26/2011 Document Reviewed: 03/11/2010 [facility] Patient Information 2012 [place], [place].

## 2011-07-12 NOTE — Progress Notes (Signed)
  Subjective:    Patient ID: Andrea Santiago, female    DOB: Jan 19, 1941, 71 y.o.   MRN: [id number]  HPI Pt here with posion oak contact and tick bite . Exoosed to posion ivy a few days ago while out in her garden, she has tried calamine lotion and benadryl but they are not helping with the itch, she also pulled a tick off around the same time   Review of Systems  GEN- denies fatigue, fever, weight loss,weakness, recent illness HEENT- denies eye drainage, change in vision, nasal discharge, CVS- denies chest pain, palpitations RESP- denies SOB, cough, wheeze ABD- denies N/V, change in stools, abd pain GU- denies dysuria, hematuria, dribbling, incontinence MSK- denies joint pain, muscle aches, injury Neuro- denies headache, dizziness, syncope, seizure activity       Objective:   Physical Exam GEN- NAD, alert and oriented x3 HEENT- PERRL, EOMI, non injected sclera, pink conjunctiva, MMM, oropharynx clear Neck- Supple, no LAD CVS- RRR, no murmur RESP-CTAB EXT- No edema Pulses- Radial, DP- 2+ Skin- erythematous macular regions on forearms and legs, few liner lesions, small bug bite on right buttocks no central clearing, no induration, no fluctuance        Assessment & Plan:

## 2011-07-13 NOTE — Assessment & Plan Note (Signed)
Solumedrol IM injection, followed by Medrol Dose Pak, see instructions

## 2011-07-13 NOTE — Assessment & Plan Note (Signed)
No evidence of infection or lyme , no treatment needed

## 2011-07-31 ENCOUNTER — Encounter: Payer: Self-pay | Admitting: Family Medicine

## 2011-07-31 ENCOUNTER — Ambulatory Visit (INDEPENDENT_AMBULATORY_CARE_PROVIDER_SITE_OTHER): Payer: Medicare Other | Admitting: Family Medicine

## 2011-07-31 VITALS — BP 122/62 | HR 70 | Resp 18 | Ht 64.5 in | Wt 160.0 lb

## 2011-07-31 DIAGNOSIS — L0390 Cellulitis, unspecified: Secondary | ICD-10-CM

## 2011-07-31 DIAGNOSIS — I10 Essential (primary) hypertension: Secondary | ICD-10-CM | POA: Diagnosis not present

## 2011-07-31 DIAGNOSIS — F329 Major depressive disorder, single episode, unspecified: Secondary | ICD-10-CM

## 2011-07-31 DIAGNOSIS — L0291 Cutaneous abscess, unspecified: Secondary | ICD-10-CM

## 2011-07-31 DIAGNOSIS — R040 Epistaxis: Secondary | ICD-10-CM

## 2011-07-31 DIAGNOSIS — L255 Unspecified contact dermatitis due to plants, except food: Secondary | ICD-10-CM

## 2011-07-31 MED ORDER — DOXYCYCLINE HYCLATE 100 MG PO TABS: 100.0000 mg | ORAL_TABLET | Freq: Two times a day (BID) | ORAL | Status: AC

## 2011-07-31 MED ORDER — HYDROXYZINE HCL 10 MG PO TABS: ORAL_TABLET | ORAL | Status: DC

## 2011-07-31 MED ORDER — PREDNISONE (PAK) 5 MG PO TABS: 5.0000 mg | ORAL_TABLET | ORAL | Status: DC

## 2011-07-31 NOTE — Patient Instructions (Addendum)
F/u as before .   You are being treated for mild cellulitis and skin infection whereyou have been scratching.  Please call if not better next week, and avoid poison oak.  You are referred to ENT for nose bleeds from right nostril

## 2011-07-31 NOTE — Assessment & Plan Note (Addendum)
2 episodes of right sided epistaxis 1 week ago, refer to ENT

## 2011-07-31 NOTE — Progress Notes (Signed)
  Subjective:    Patient ID: Andrea Santiago, female    DOB: September 03, 1940, 71 y.o.   MRN: [id number]  HPI Pt exposed to poison oak approx 3 weeks ago, here today with a lot of generalized itching, sore in area of previous rash and c/o new area of erythema on the right leg. No chest tightness or wheezing, no difficulty swallowing C/o epistaxis from right nostril, denies sinus pressure , sore throat or productive cough or hemoptysis    Review of Systems See HPI Denies recent fever or chills. Denies sinus pressure, nasal congestion, ear pain or sore throat. Denies chest congestion, productive cough or wheezing. Denies chest pains, palpitations and leg swelling Denies abdominal pain, nausea, vomiting,diarrhea or constipation.   Denies dysuria, frequency, hesitancy or incontinence.  Denies headaches, seizures, numbness, or tingling. Denies depression, anxiety or insomnia.       Objective:   Physical Exam Patient alert and oriented and in no cardiopulmonary distress.  HEENT: No facial asymmetry, EOMI, no sinus tenderness,  oropharynx pink and moist.  Neck supple no adenopathy.  Chest: Clear to auscultation bilaterally.  CVS: S1, S2 no murmurs, no S3.  ABD: Soft non tender. Bowel sounds normal.  Ext: No edema  MS: Decreased  ROM spine, shoulders, hips and knees.  Skin:Warm erythematous macular rash on lower extremities  Psych: Good eye contact, normal affect. Memory intact not anxious or depressed appearing.  CNS: CN 2-12 intact, power, tone and sensation normal throughout.        Assessment & Plan:

## 2011-08-06 NOTE — Assessment & Plan Note (Signed)
Superinfection of area of allergic dermatitis, antibiotics prescribed

## 2011-08-06 NOTE — Assessment & Plan Note (Signed)
Still has erythematous areas, now super infected, antibiotic prescribed

## 2011-08-06 NOTE — Assessment & Plan Note (Signed)
Controlled, no change in medication  

## 2011-08-28 ENCOUNTER — Other Ambulatory Visit: Payer: Self-pay | Admitting: Family Medicine

## 2011-08-30 ENCOUNTER — Ambulatory Visit (INDEPENDENT_AMBULATORY_CARE_PROVIDER_SITE_OTHER): Payer: Medicare Other | Admitting: Family Medicine

## 2011-08-30 ENCOUNTER — Encounter: Payer: Self-pay | Admitting: Family Medicine

## 2011-08-30 VITALS — BP 110/70 | HR 61 | Temp 97.5°F | Resp 16 | Ht 64.5 in | Wt 161.8 lb

## 2011-08-30 DIAGNOSIS — J019 Acute sinusitis, unspecified: Secondary | ICD-10-CM

## 2011-08-30 DIAGNOSIS — J40 Bronchitis, not specified as acute or chronic: Secondary | ICD-10-CM | POA: Diagnosis not present

## 2011-08-30 DIAGNOSIS — L255 Unspecified contact dermatitis due to plants, except food: Secondary | ICD-10-CM

## 2011-08-30 DIAGNOSIS — J329 Chronic sinusitis, unspecified: Secondary | ICD-10-CM | POA: Insufficient documentation

## 2011-08-30 MED ORDER — PREDNISONE 10 MG PO TABS: ORAL_TABLET | ORAL | Status: DC

## 2011-08-30 MED ORDER — MOXIFLOXACIN HCL 400 MG PO TABS: 400.0000 mg | ORAL_TABLET | Freq: Every day | ORAL | Status: AC

## 2011-08-30 MED ORDER — ALBUTEROL SULFATE HFA 108 (90 BASE) MCG/ACT IN AERS: 2.0000 | INHALATION_SPRAY | RESPIRATORY_TRACT | Status: DC | PRN

## 2011-08-30 NOTE — Assessment & Plan Note (Signed)
Antibiotics per above Nasal saline

## 2011-08-30 NOTE — Patient Instructions (Signed)
I am treating for for sinus and bronchitis Take the antibiotics   Use the inhaler for your breathing F/U Friday evening for recheck  Pick up steroids if you do not get better

## 2011-08-30 NOTE — Assessment & Plan Note (Addendum)
Based on history and age, pt at risk with current illness, treat with Avelox as recently on doxycycline for cellulitis of LE. Albuterol inhaler, prednisone sent but pt not to start unless she worsens, though not hypoxic her oxygen sat is a little lower than her baseline Will f/u in 48 hours for recheck, CXR may be needed

## 2011-08-30 NOTE — Progress Notes (Signed)
  Subjective:    Patient ID: Andrea Santiago, female    DOB: 29-Nov-1940, 71 y.o.   MRN: [id number]  HPI Non productive cough x 4 days, also admits to sinus drainage and fatigue. Denies fever. She gets bronchitis and sinusitis typically once a year and symptoms currently are typical for her.She is using OTC cough med, and nasal spray, also tried an sinus medication which has not helped. Decreased appetitite but denies N/V, Diarrhea.    Review of Systems  GEN- denies fatigue, fever, weight loss,weakness, recent illness HEENT- denies eye drainage, change in vision, nasal discharge,, + sinus pressure on right side CVS- denies chest pain, palpitations RESP- OCC SOB, +cough, denies wheeze ABD- denies N/V, change in stools, abd pain GU- denies dysuria, hematuria, dribbling, incontinence Neuro- denies headache, dizziness, syncope, seizure activity       Objective:   Physical Exam GEN- NAD, alert and oriented x3 HEENT- PERRL, EOMI, non injected sclera, pink conjunctiva, MMM, oropharynx clear, clear rhinorrhea, TM clear bilat no effusion, +sinus pressure R>L Neck- Supple, no LAD CVS- RRR, no murmur RESP-clear with harsh cough, no rhonchi, ? mild wheeze with forced expiration,normal WOB, no retractions EXT- No edema Pulses- Radial, DP- 2+ Skin- few scattered bug bites with scabs on lower ext, no pus noted       Assessment & Plan:

## 2011-09-01 ENCOUNTER — Ambulatory Visit: Payer: Medicare Other | Admitting: Family Medicine

## 2011-09-03 ENCOUNTER — Telehealth: Payer: Self-pay | Admitting: Family Medicine

## 2011-09-03 NOTE — Telephone Encounter (Signed)
Called to check on pt, missed appt for recheck on Fridays, states she is much improved has 1 more antibiotic to take, doing well

## 2011-09-06 ENCOUNTER — Other Ambulatory Visit: Payer: Self-pay | Admitting: Family Medicine

## 2011-09-07 ENCOUNTER — Ambulatory Visit (INDEPENDENT_AMBULATORY_CARE_PROVIDER_SITE_OTHER): Payer: Medicare Other | Admitting: Otolaryngology

## 2011-10-03 ENCOUNTER — Other Ambulatory Visit: Payer: Self-pay | Admitting: Family Medicine

## 2011-10-03 DIAGNOSIS — M47812 Spondylosis without myelopathy or radiculopathy, cervical region: Secondary | ICD-10-CM | POA: Diagnosis not present

## 2011-10-03 DIAGNOSIS — M5030 Other cervical disc degeneration, unspecified cervical region: Secondary | ICD-10-CM | POA: Diagnosis not present

## 2011-10-03 DIAGNOSIS — M542 Cervicalgia: Secondary | ICD-10-CM | POA: Diagnosis not present

## 2011-10-05 ENCOUNTER — Ambulatory Visit (INDEPENDENT_AMBULATORY_CARE_PROVIDER_SITE_OTHER): Payer: Medicaid Other | Admitting: Otolaryngology

## 2011-10-05 DIAGNOSIS — R040 Epistaxis: Secondary | ICD-10-CM

## 2011-10-23 ENCOUNTER — Other Ambulatory Visit: Payer: Self-pay | Admitting: Family Medicine

## 2011-10-25 ENCOUNTER — Ambulatory Visit: Payer: Medicare Other | Admitting: Family Medicine

## 2011-10-25 ENCOUNTER — Other Ambulatory Visit: Payer: Self-pay | Admitting: Family Medicine

## 2011-10-25 DIAGNOSIS — R7301 Impaired fasting glucose: Secondary | ICD-10-CM | POA: Diagnosis not present

## 2011-10-26 LAB — HEMOGLOBIN A1C
Hgb A1c MFr Bld: 6.2 % — ABNORMAL HIGH (ref <5.7)
Mean Plasma Glucose: 131 mg/dL — ABNORMAL HIGH (ref <117)

## 2011-11-09 ENCOUNTER — Ambulatory Visit (INDEPENDENT_AMBULATORY_CARE_PROVIDER_SITE_OTHER): Payer: Medicare Other | Admitting: Otolaryngology

## 2011-11-13 ENCOUNTER — Encounter: Payer: Self-pay | Admitting: Family Medicine

## 2011-11-13 ENCOUNTER — Ambulatory Visit (INDEPENDENT_AMBULATORY_CARE_PROVIDER_SITE_OTHER): Payer: Medicare Other | Admitting: Family Medicine

## 2011-11-13 VITALS — BP 122/72 | HR 65 | Resp 16 | Ht 64.5 in | Wt 160.0 lb

## 2011-11-13 DIAGNOSIS — R5381 Other malaise: Secondary | ICD-10-CM

## 2011-11-13 DIAGNOSIS — L309 Dermatitis, unspecified: Secondary | ICD-10-CM

## 2011-11-13 DIAGNOSIS — F329 Major depressive disorder, single episode, unspecified: Secondary | ICD-10-CM

## 2011-11-13 DIAGNOSIS — R7301 Impaired fasting glucose: Secondary | ICD-10-CM

## 2011-11-13 DIAGNOSIS — L259 Unspecified contact dermatitis, unspecified cause: Secondary | ICD-10-CM | POA: Diagnosis not present

## 2011-11-13 DIAGNOSIS — I10 Essential (primary) hypertension: Secondary | ICD-10-CM

## 2011-11-13 DIAGNOSIS — E785 Hyperlipidemia, unspecified: Secondary | ICD-10-CM

## 2011-11-13 DIAGNOSIS — E739 Lactose intolerance, unspecified: Secondary | ICD-10-CM

## 2011-11-13 DIAGNOSIS — J309 Allergic rhinitis, unspecified: Secondary | ICD-10-CM

## 2011-11-13 NOTE — Assessment & Plan Note (Addendum)
Referred to dermatology re evaluation of rash on left leg, appears to be scars , pt requests referral

## 2011-11-13 NOTE — Assessment & Plan Note (Addendum)
Patient advised to reduce carb and sweets, commit to regular physical activity, take meds as prescribed, test blood as directed, and attempt to lose weight, to improve blood sugar control. Updated lab shows improvement

## 2011-11-13 NOTE — Patient Instructions (Addendum)
Annual wellness in end January, please call if you need me before.  Flu vaccine today.  You are referred to dermatology re rash on left leg  Blood sugar average is improved, congrats  Fasting lipid, cmp and hBA1C end January  Please continue to work on healthy diet and weight loss

## 2011-11-13 NOTE — Assessment & Plan Note (Addendum)
Hyperlipidemia:Low fat diet discussed and encouraged.  Uncontrolled when  last checked,updated lab needed

## 2011-11-13 NOTE — Progress Notes (Signed)
  Subjective:    Patient ID: Andrea Santiago, female    DOB: 06-17-1940, 71 y.o.   MRN: [id number]  HPI The PT is here for follow up and re-evaluation of chronic medical conditions, medication management and review of any available recent lab and radiology data.  Preventive health is updated, specifically  Cancer screening and Immunization.   Questions or concerns regarding consultations or procedures which the PT has had in the interim are  addressed. The PT denies any adverse reactions to current medications since the last visit.  C/o hyperpigmented annular rashes on left leg where she was treated in the Summer for poison oak. States she does not believe this is the problem and wants derm to eval, no other new concerns      Review of Systems See HPI Denies recent fever or chills. Denies sinus pressure, nasal congestion, ear pain or sore throat. Denies chest congestion, productive cough or wheezing. Denies chest pains, palpitations and leg swelling Denies abdominal pain, nausea, vomiting,diarrhea or constipation.   Denies dysuria, frequency, hesitancy or incontinence. Chronic  joint pain, and muscle spasm and limitation in mobility. Denies headaches, seizures, numbness, or tingling. Denies uncontrolled  depression, anxiety or insomnia. C/o rash on left leg wants derm to eval        Objective:   Physical Exam  Patient alert and oriented and in no cardiopulmonary distress.  HEENT: No facial asymmetry, EOMI, no sinus tenderness,  oropharynx pink and moist.  Neck decreased ROM no adenopathy.  Chest: Clear to auscultation bilaterally.  CVS: S1, S2 no murmurs, no S3.  ABD: Soft non tender. Bowel sounds normal.  Ext: No edema  MS: decreased  ROM spine, shoulders, hips and knees.  Skin: Intact, hyperpigmented annular rash on anterior left leg, appears like scars  Psych: Good eye contact, normal affect. Memory intact not anxious or depressed appearing.  CNS: CN 2-12 intact,  power, tone and sensation normal throughout.       Assessment & Plan:

## 2011-11-13 NOTE — Assessment & Plan Note (Signed)
Controlled, no change in medication DASH diet and commitment to daily physical activity for a minimum of 30 minutes discussed and encouraged, as a part of hypertension management. The importance of attaining a healthy weight is also discussed.  

## 2011-11-13 NOTE — Assessment & Plan Note (Signed)
Controlled, no change in medication  

## 2011-11-14 ENCOUNTER — Telehealth: Payer: Self-pay | Admitting: Family Medicine

## 2011-11-15 NOTE — Addendum Note (Signed)
Addended by: Abner Greenspan on: 11/15/2011 09:29 AM   Modules accepted: Orders

## 2011-11-15 NOTE — Telephone Encounter (Signed)
Patient is aware 

## 2011-11-16 ENCOUNTER — Ambulatory Visit (INDEPENDENT_AMBULATORY_CARE_PROVIDER_SITE_OTHER): Payer: Medicare Other | Admitting: Otolaryngology

## 2011-11-16 DIAGNOSIS — R040 Epistaxis: Secondary | ICD-10-CM | POA: Diagnosis not present

## 2011-11-17 ENCOUNTER — Other Ambulatory Visit: Payer: Self-pay

## 2011-11-17 MED ORDER — CHOLINE FENOFIBRATE 135 MG PO CPDR: DELAYED_RELEASE_CAPSULE | ORAL | Status: DC

## 2011-11-17 MED ORDER — NIACIN ER (ANTIHYPERLIPIDEMIC) 1000 MG PO TBCR: EXTENDED_RELEASE_TABLET | ORAL | Status: DC

## 2011-11-17 MED ORDER — TRAZODONE HCL 150 MG PO TABS: ORAL_TABLET | ORAL | Status: DC

## 2011-12-24 ENCOUNTER — Other Ambulatory Visit: Payer: Self-pay | Admitting: Family Medicine

## 2011-12-30 ENCOUNTER — Other Ambulatory Visit: Payer: Self-pay | Admitting: Family Medicine

## 2012-01-01 ENCOUNTER — Other Ambulatory Visit: Payer: Self-pay | Admitting: Family Medicine

## 2012-02-20 ENCOUNTER — Other Ambulatory Visit: Payer: Self-pay | Admitting: Family Medicine

## 2012-02-25 ENCOUNTER — Other Ambulatory Visit: Payer: Self-pay | Admitting: Family Medicine

## 2012-03-05 ENCOUNTER — Ambulatory Visit (INDEPENDENT_AMBULATORY_CARE_PROVIDER_SITE_OTHER): Payer: Medicare Other | Admitting: Family Medicine

## 2012-03-05 ENCOUNTER — Encounter: Payer: Self-pay | Admitting: Family Medicine

## 2012-03-05 VITALS — BP 138/80 | HR 96 | Temp 98.8°F | Resp 16 | Ht 64.5 in | Wt 165.0 lb

## 2012-03-05 DIAGNOSIS — I10 Essential (primary) hypertension: Secondary | ICD-10-CM

## 2012-03-05 DIAGNOSIS — IMO0002 Reserved for concepts with insufficient information to code with codable children: Secondary | ICD-10-CM

## 2012-03-05 DIAGNOSIS — F329 Major depressive disorder, single episode, unspecified: Secondary | ICD-10-CM

## 2012-03-05 DIAGNOSIS — J40 Bronchitis, not specified as acute or chronic: Secondary | ICD-10-CM | POA: Diagnosis not present

## 2012-03-05 MED ORDER — PREDNISONE (PAK) 5 MG PO TABS: 5.0000 mg | ORAL_TABLET | ORAL | Status: DC

## 2012-03-05 MED ORDER — BENZONATATE 100 MG PO CAPS: 100.0000 mg | ORAL_CAPSULE | Freq: Four times a day (QID) | ORAL | Status: DC | PRN

## 2012-03-05 MED ORDER — PROMETHAZINE-DM 6.25-15 MG/5ML PO SYRP: ORAL_SOLUTION | ORAL | Status: AC

## 2012-03-05 MED ORDER — AZITHROMYCIN 250 MG PO TABS: ORAL_TABLET | ORAL | Status: AC

## 2012-03-05 NOTE — Patient Instructions (Addendum)
Annual wellness in 4 to 5 weeks, cancel sooner appt.Please call if you need me before  Please bring all medication you are taken to the next visit  You are treated for acute bronchitis, neb treatment in the office, depo medrol 80mg  iM and Rocephin 500mg  im  Toradol 60 mg is administered IM for excessive back pain and general pain  Zpack, tessalon pwerles, prednisone and phenergan Dm are prescribed

## 2012-03-06 DIAGNOSIS — J40 Bronchitis, not specified as acute or chronic: Secondary | ICD-10-CM | POA: Diagnosis not present

## 2012-03-06 MED ORDER — METHYLPREDNISOLONE ACETATE 80 MG/ML IJ SUSP
80.0000 mg | Freq: Once | INTRAMUSCULAR | Status: AC
  Administered 2012-03-06: 80 mg via INTRAMUSCULAR

## 2012-03-06 MED ORDER — IPRATROPIUM BROMIDE 0.02 % IN SOLN
0.5000 mg | Freq: Once | RESPIRATORY_TRACT | Status: AC
  Administered 2012-03-06: 0.5 mg via RESPIRATORY_TRACT

## 2012-03-06 MED ORDER — KETOROLAC TROMETHAMINE 60 MG/2ML IM SOLN
60.0000 mg | Freq: Once | INTRAMUSCULAR | Status: AC
  Administered 2012-03-06: 60 mg via INTRAMUSCULAR

## 2012-03-06 MED ORDER — CEFTRIAXONE SODIUM 500 MG IJ SOLR
500.0000 mg | Freq: Once | INTRAMUSCULAR | Status: AC
  Administered 2012-03-06: 500 mg via INTRAMUSCULAR

## 2012-03-06 MED ORDER — ALBUTEROL SULFATE (2.5 MG/3ML) 0.083% IN NEBU
2.5000 mg | INHALATION_SOLUTION | Freq: Once | RESPIRATORY_TRACT | Status: AC
  Administered 2012-03-06: 2.5 mg via RESPIRATORY_TRACT

## 2012-03-07 ENCOUNTER — Other Ambulatory Visit: Payer: Self-pay | Admitting: Family Medicine

## 2012-03-10 NOTE — Assessment & Plan Note (Signed)
Increased and uncontrolled wit excessive cough and illness, toradol and depo medrol in office

## 2012-03-10 NOTE — Progress Notes (Signed)
  Subjective:    Patient ID: Andrea Santiago, female    DOB: 07/27/1940, 72 y.o.   MRN: [id number]  HPI 4 day h/o increased facial pain with clear nasal drainage, sore throat , cough productive of green sputum , fever and chills. Generalized pain and myalgias increased and uncontrolled   Review of Systems See HPI  Denies chest pains, palpitations and leg swelling Denies abdominal pain, nausea, vomiting,diarrhea or constipation.   Denies dysuria, frequency, hesitancy or incontinence. . Denies headaches, seizures, numbness, or tingling. Denies depression, anxiety or insomnia. Denies skin break down or rash.        Objective:   Physical Exam Patient alert and oriented and in no cardiopulmonary distress.Ill appearing  HEENT: No facial asymmetry, EOMI, no sinus tenderness,  oropharynx pink and moist.  Neck adequate ROM,no adenopathy.  Chest: decreased air entry, scattered crackles and wheezes CVS: S1, S2 no murmurs, no S3.  ABD: Soft non tender. Bowel sounds normal.  Ext: No edema  MS: decreased  ROM spine, shoulders, hips and knees.  Skin: Intact, no ulcerations or rash noted.  Psych: Good eye contact, normal affect. Memory intact not anxious or depressed appearing.  CNS: CN 2-12 intact, power, tone and sensation normal throughout.        Assessment & Plan:

## 2012-03-10 NOTE — Assessment & Plan Note (Signed)
Controlled, no change in medication  

## 2012-03-10 NOTE — Assessment & Plan Note (Addendum)
Current acute episode, neb treatment in office with antibioitcs and steroid

## 2012-03-18 ENCOUNTER — Other Ambulatory Visit: Payer: Self-pay | Admitting: Family Medicine

## 2012-03-18 ENCOUNTER — Ambulatory Visit: Payer: Medicare Other | Admitting: Family Medicine

## 2012-03-18 DIAGNOSIS — R7301 Impaired fasting glucose: Secondary | ICD-10-CM | POA: Diagnosis not present

## 2012-03-18 DIAGNOSIS — I10 Essential (primary) hypertension: Secondary | ICD-10-CM | POA: Diagnosis not present

## 2012-03-18 DIAGNOSIS — E785 Hyperlipidemia, unspecified: Secondary | ICD-10-CM | POA: Diagnosis not present

## 2012-03-19 LAB — COMPREHENSIVE METABOLIC PANEL
ALT: 24 U/L (ref 0–35)
AST: 22 U/L (ref 0–37)
Albumin: 4.5 g/dL (ref 3.5–5.2)
Alkaline Phosphatase: 43 U/L (ref 39–117)
BUN: 14 mg/dL (ref 6–23)
CO2: 26 mEq/L (ref 19–32)
Calcium: 9.8 mg/dL (ref 8.4–10.5)
Chloride: 107 mEq/L (ref 96–112)
Creat: 0.75 mg/dL (ref 0.50–1.10)
Glucose, Bld: 101 mg/dL — ABNORMAL HIGH (ref 70–99)
Potassium: 4.4 mEq/L (ref 3.5–5.3)
Sodium: 142 mEq/L (ref 135–145)
Total Bilirubin: 0.4 mg/dL (ref 0.3–1.2)
Total Protein: 7.4 g/dL (ref 6.0–8.3)

## 2012-03-19 LAB — LIPID PANEL
Cholesterol: 218 mg/dL — ABNORMAL HIGH (ref 0–200)
HDL: 59 mg/dL (ref >39)
LDL Cholesterol: 138 mg/dL — ABNORMAL HIGH (ref 0–99)
Total CHOL/HDL Ratio: 3.7 Ratio
Triglycerides: 104 mg/dL (ref <150)
VLDL: 21 mg/dL (ref 0–40)

## 2012-03-19 LAB — HEMOGLOBIN A1C
Hgb A1c MFr Bld: 6.3 % — ABNORMAL HIGH (ref <5.7)
Mean Plasma Glucose: 134 mg/dL — ABNORMAL HIGH (ref <117)

## 2012-03-21 ENCOUNTER — Ambulatory Visit: Payer: Medicare Other | Admitting: Family Medicine

## 2012-04-15 ENCOUNTER — Encounter: Payer: Self-pay | Admitting: Family Medicine

## 2012-04-15 ENCOUNTER — Encounter: Payer: Medicare Other | Admitting: Family Medicine

## 2012-04-15 ENCOUNTER — Ambulatory Visit (INDEPENDENT_AMBULATORY_CARE_PROVIDER_SITE_OTHER): Payer: Medicare Other | Admitting: Family Medicine

## 2012-04-15 ENCOUNTER — Ambulatory Visit (HOSPITAL_COMMUNITY)
Admission: RE | Admit: 2012-04-15 | Discharge: 2012-04-15 | Disposition: A | Discharge status: Home / Self Care | Payer: Medicare Other | Source: Ambulatory Visit | Attending: Family Medicine | Admitting: Family Medicine

## 2012-04-15 VITALS — BP 112/70 | HR 77 | Resp 16 | Ht 64.5 in | Wt 168.1 lb

## 2012-04-15 DIAGNOSIS — R1310 Dysphagia, unspecified: Secondary | ICD-10-CM

## 2012-04-15 DIAGNOSIS — R5383 Other fatigue: Secondary | ICD-10-CM

## 2012-04-15 DIAGNOSIS — E785 Hyperlipidemia, unspecified: Secondary | ICD-10-CM

## 2012-04-15 DIAGNOSIS — E663 Overweight: Secondary | ICD-10-CM

## 2012-04-15 DIAGNOSIS — R6889 Other general symptoms and signs: Secondary | ICD-10-CM

## 2012-04-15 DIAGNOSIS — R7301 Impaired fasting glucose: Secondary | ICD-10-CM

## 2012-04-15 DIAGNOSIS — J209 Acute bronchitis, unspecified: Secondary | ICD-10-CM

## 2012-04-15 DIAGNOSIS — J40 Bronchitis, not specified as acute or chronic: Secondary | ICD-10-CM | POA: Diagnosis not present

## 2012-04-15 DIAGNOSIS — R05 Cough: Secondary | ICD-10-CM | POA: Insufficient documentation

## 2012-04-15 DIAGNOSIS — K219 Gastro-esophageal reflux disease without esophagitis: Secondary | ICD-10-CM | POA: Diagnosis not present

## 2012-04-15 DIAGNOSIS — M949 Disorder of cartilage, unspecified: Secondary | ICD-10-CM

## 2012-04-15 DIAGNOSIS — R059 Cough, unspecified: Secondary | ICD-10-CM | POA: Diagnosis not present

## 2012-04-15 DIAGNOSIS — R7989 Other specified abnormal findings of blood chemistry: Secondary | ICD-10-CM

## 2012-04-15 DIAGNOSIS — M899 Disorder of bone, unspecified: Secondary | ICD-10-CM

## 2012-04-15 DIAGNOSIS — R5381 Other malaise: Secondary | ICD-10-CM

## 2012-04-15 DIAGNOSIS — IMO0001 Reserved for inherently not codable concepts without codable children: Secondary | ICD-10-CM

## 2012-04-15 DIAGNOSIS — Z0000 Encounter for general adult medical examination without abnormal findings: Secondary | ICD-10-CM | POA: Diagnosis not present

## 2012-04-15 DIAGNOSIS — I10 Essential (primary) hypertension: Secondary | ICD-10-CM

## 2012-04-15 DIAGNOSIS — Z1211 Encounter for screening for malignant neoplasm of colon: Secondary | ICD-10-CM

## 2012-04-15 LAB — CBC WITH DIFFERENTIAL/PLATELET
Basophils Absolute: 0 10*3/uL (ref 0.0–0.1)
Basophils Relative: 0 % (ref 0–1)
Eosinophils Absolute: 0.1 10*3/uL (ref 0.0–0.7)
Eosinophils Relative: 1 % (ref 0–5)
HCT: 39.6 % (ref 36.0–46.0)
Hemoglobin: 13.7 g/dL (ref 12.0–15.0)
Lymphocytes Relative: 40 % (ref 12–46)
Lymphs Abs: 3.4 10*3/uL (ref 0.7–4.0)
MCH: 30.2 pg (ref 26.0–34.0)
MCHC: 34.6 g/dL (ref 30.0–36.0)
MCV: 87.2 fL (ref 78.0–100.0)
Monocytes Absolute: 0.9 10*3/uL (ref 0.1–1.0)
Monocytes Relative: 10 % (ref 3–12)
Neutro Abs: 4.1 10*3/uL (ref 1.7–7.7)
Neutrophils Relative %: 49 % (ref 43–77)
Platelets: 267 10*3/uL (ref 150–400)
RBC: 4.54 MIL/uL (ref 3.87–5.11)
RDW: 14.4 % (ref 11.5–15.5)
WBC: 8.4 10*3/uL (ref 4.0–10.5)

## 2012-04-15 LAB — TSH: TSH: 3.372 u[IU]/mL (ref 0.350–4.500)

## 2012-04-15 NOTE — Progress Notes (Signed)
Subjective:    Patient ID: Andrea Santiago, female    DOB: 08/12/1940, 72 y.o.   MRN: [id number]  HPI Preventive Screening-Counseling & Management   Patient present here today for a Medicare annual wellness visit. Has c/o increased dysphagia for solid foods, and her colonoscopy , she believes is over 35 years old. Denies change in bowel movements, but will be referred to GI Needs to also review recent labs, HBa1C has increased and lipids are uncontrolled as has her weight . These are all addressed Requests referral for re eval of her fibromyalgia as she hopes for an improved quality of life   Current Problems (verified)   Medications Prior to Visit Allergies (verified)   PAST HISTORY  Family History Brother lung cancer to brain, sister bladder cancer, bothe sisters diabetic, mom had CHF and stroke. One brother living , 3 sibs died  Social History Divorced x 24 years. Mother one biological son. Never any drug use. 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 No  Over the past two weeks, have you felt little interest or pleasure in doing things? No  Have you lost interest or pleasure in daily life? No  Do you often feel hopeless? No  Do you cry easily over simple problems? No   Activities of Daily Living  In your present state of health, do you have any difficulty performing the following activities?request re evaluation for her fibromylagia as her chronic pain is significant  Driving?: No Managing money?: No Feeding yourself?:No Getting from bed to chair?:slow and stiff Climbing a flight of stairs?:yes some difficulty going down Preparing  food and eating?:No Bathing or showering?:No Getting dressed?:No Getting to the toilet?:No Using the toilet?:No Moving around from place to place?:at times flare   Fall Risk Assessment In the past year have you fallen or had a near fall?:yes twice , stepped in a hole , fell down hill Are you currently taking any medications that make you dizziness?:No   Hearing Difficulties: No Do you often ask people to speak up or repeat themselves?: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 Yes Normal Appearance?Yes  Oriented to person? Yes Place? Yes  Time? Yes  Displays appropriate judgment?Yes  Can read the correct time from a watch face? yes Are you having problems remembering things?No  Advanced Directives have been discussed with the patient?Full code    List the Names of Other Physician/Practitioners you currently use: Dr Deversoir,Dr Kendell Bane, Dr Suszanne Conners, Dr Barbie Haggis any recent Medical Services you may have received from other than [facility] providers in the past year (date may be approximate).   Assessment:    Annual Wellness Exam   Plan:    During the course of the visit the patient was educated and counseled about appropriate screening and preventive services including:  A healthy diet is rich in fruit, vegetables and whole grains. Poultry fish, nuts and beans are a healthy choice for protein rather then red meat. A low sodium diet and  drinking 64 ounces of water daily is generally recommended. Oils and sweet should be limited. Carbohydrates especially for those who are diabetic or overweight, should be limited to 30-45 gram per meal. It is important to eat on a regular schedule, at least 3 times daily. Snacks should be primarily fruits, vegetables or nuts. It is important that you exercise regularly at least 30 minutes 5 times  a week. If you develop chest pain, have severe difficulty breathing, or feel very tired, stop exercising immediately and seek medical attention  Immunization reviewed and updated. Cancer screening reviewed and updated    Patient Instructions (the written plan) was given to the patient.  Medicare Attestation  I have personally reviewed:  The patient's medical and social history  Their use of alcohol, tobacco or illicit drugs  Their current medications and supplements  The patient's functional ability including ADLs,fall risks, home safety risks, cognitive, and hearing and visual impairment  Diet and physical activities  Evidence for depression or mood disorders  The patient's weight, height, BMI, and visual acuity have been recorded in the chart. I have made referrals, counseling, and provided education to the patient based on review of the above and I have provided the patient with a written personalized care plan for preventive services.      Review of Systems     Objective:   Physical Exam        Assessment & Plan:

## 2012-04-15 NOTE — Assessment & Plan Note (Signed)
Increased generalized pain and stiffness x 4 month, requests re eval by rheumatology

## 2012-04-15 NOTE — Patient Instructions (Addendum)
F/u in 4.5 month, call if you need me before.  Labs today, cbc, tsh, vit D, also CXR and sputum cup to submit yellow soputum for testing if you have any  You need to attend a class on eating as a diabetic, you will get info on this from the nurse.  Please reduce cheese and oil, cholesterol has increased.  Fasting lipid, cmp HBA1C in 4.5 month  You are referred to rheumatology and to Dr Jena Gauss as we discussed.  Please commit to exercise at least 5 days per week as able  We will refill loratidine, you need this daily for allergies

## 2012-04-19 ENCOUNTER — Other Ambulatory Visit: Payer: Self-pay

## 2012-04-19 ENCOUNTER — Telehealth: Payer: Self-pay | Admitting: Family Medicine

## 2012-04-19 MED ORDER — CHOLINE FENOFIBRATE 135 MG PO CPDR: DELAYED_RELEASE_CAPSULE | ORAL | Status: DC

## 2012-04-19 MED ORDER — NIACIN ER (ANTIHYPERLIPIDEMIC) 1000 MG PO TBCR: EXTENDED_RELEASE_TABLET | ORAL | Status: DC

## 2012-04-19 MED ORDER — TRAZODONE HCL 150 MG PO TABS: ORAL_TABLET | ORAL | Status: DC

## 2012-04-19 NOTE — Telephone Encounter (Signed)
Noted  

## 2012-04-23 ENCOUNTER — Encounter: Payer: Self-pay | Admitting: Gastroenterology

## 2012-04-23 DIAGNOSIS — Z0000 Encounter for general adult medical examination without abnormal findings: Secondary | ICD-10-CM | POA: Insufficient documentation

## 2012-04-23 NOTE — Assessment & Plan Note (Signed)
Uncontrolled. No med change  Dietary modification stressed

## 2012-04-23 NOTE — Assessment & Plan Note (Addendum)
Annual wellness completed as documented. Major limitation is with limited mobility and uncontrolled generalized pain from fibromyalgia and arthritis. Though fall risk is high, she has maintained overall safety. She is referred to GI due to progressive dysphagia as well as for screening colonoscopy Depression screen is negative, and memory is intact. She is a full code

## 2012-04-23 NOTE — Assessment & Plan Note (Signed)
Deteriorated. Patient re-educated about  the importance of commitment to a  minimum of 150 minutes of exercise per week. The importance of healthy food choices with portion control discussed. Encouraged to start a food diary, count calories and to consider  joining a support group. Sample diet sheets offered. Goals set by the patient for the next several months.    

## 2012-04-23 NOTE — Assessment & Plan Note (Signed)
Worsening dysphagia will refer to GI for re eval and dialtation

## 2012-04-23 NOTE — Assessment & Plan Note (Signed)
Deteriorated, needs to attend diabetic class and commit to daily physical activity

## 2012-04-29 ENCOUNTER — Other Ambulatory Visit: Payer: Self-pay | Admitting: Family Medicine

## 2012-05-08 ENCOUNTER — Ambulatory Visit: Payer: Medicare Other | Admitting: Gastroenterology

## 2012-05-13 ENCOUNTER — Encounter: Payer: Self-pay | Admitting: Internal Medicine

## 2012-05-15 ENCOUNTER — Encounter: Payer: Self-pay | Admitting: Gastroenterology

## 2012-05-15 ENCOUNTER — Ambulatory Visit (INDEPENDENT_AMBULATORY_CARE_PROVIDER_SITE_OTHER): Payer: Medicare Other | Admitting: Gastroenterology

## 2012-05-15 VITALS — BP 135/77 | HR 87 | Temp 97.8°F | Ht 64.0 in | Wt 166.8 lb

## 2012-05-15 DIAGNOSIS — Z1211 Encounter for screening for malignant neoplasm of colon: Secondary | ICD-10-CM

## 2012-05-15 DIAGNOSIS — R1310 Dysphagia, unspecified: Secondary | ICD-10-CM | POA: Insufficient documentation

## 2012-05-15 MED ORDER — PEG 3350-KCL-NA BICARB-NACL 420 G PO SOLR: 4000.0000 mL | ORAL | Status: DC
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
## 2012-05-15 NOTE — Progress Notes (Signed)
Faxed to PCP

## 2012-05-15 NOTE — Patient Instructions (Addendum)
We have scheduled you for a colonoscopy, upper endoscopy, and possible dilation with Dr. Fields in the near future.  Further recommendations to follow!   

## 2012-05-15 NOTE — Assessment & Plan Note (Signed)
Last colonoscopy at least 10 years ago in [place], unsure location. No rectal bleeding or change in bowel habits. Improvement in constipation with probiotic.   Proceed with colonoscopy at time of EGD with Dr. Darrick Penna in the near future. The risks, benefits, and alternatives have been discussed in detail with the patient. They state understanding and desire to proceed.

## 2012-05-15 NOTE — Assessment & Plan Note (Signed)
72 year old female with new-onset dysphagia in the setting of chronic GERD. No concerning symptoms such as weight loss, nausea, vomiting. Taking Prilosec BID. Known history of empiric dilation in 2005 with Dr. Jena Gauss; pt states her friend recently had procedures with Dr. Darrick Penna, and she would like to have her upcoming procedures with Dr. Darrick Penna as well.   Proceed with upper endoscopy and dilation in the near future with Dr. Darrick Penna. The risks, benefits, and alternatives have been discussed in detail with patient. They have stated understanding and desire to proceed.  As of note, patient quite concerned about her left sternoclavicular region, stating it is more prominent and "higher" than the right. She wants further studies, and she states she has told her PCP. Doubt this is a pathological process. Will obtain xray at patient's request, further management through Dr. Lodema Hong.

## 2012-05-20 ENCOUNTER — Encounter (HOSPITAL_COMMUNITY): Payer: Self-pay | Admitting: Pharmacy Technician

## 2012-05-20 ENCOUNTER — Encounter: Payer: Medicare Other | Admitting: Family Medicine

## 2012-05-29 DIAGNOSIS — IMO0001 Reserved for inherently not codable concepts without codable children: Secondary | ICD-10-CM | POA: Diagnosis not present

## 2012-05-29 DIAGNOSIS — R300 Dysuria: Secondary | ICD-10-CM | POA: Diagnosis not present

## 2012-05-29 DIAGNOSIS — M25579 Pain in unspecified ankle and joints of unspecified foot: Secondary | ICD-10-CM | POA: Diagnosis not present

## 2012-05-29 DIAGNOSIS — M19049 Primary osteoarthritis, unspecified hand: Secondary | ICD-10-CM | POA: Diagnosis not present

## 2012-05-29 DIAGNOSIS — Z79899 Other long term (current) drug therapy: Secondary | ICD-10-CM | POA: Diagnosis not present

## 2012-05-29 DIAGNOSIS — M25559 Pain in unspecified hip: Secondary | ICD-10-CM | POA: Diagnosis not present

## 2012-05-29 DIAGNOSIS — M2550 Pain in unspecified joint: Secondary | ICD-10-CM | POA: Diagnosis not present

## 2012-05-29 DIAGNOSIS — R5381 Other malaise: Secondary | ICD-10-CM | POA: Diagnosis not present

## 2012-06-03 ENCOUNTER — Encounter (HOSPITAL_COMMUNITY)
Admission: RE | Disposition: A | Discharge status: Home / Self Care | Payer: Self-pay | Source: Ambulatory Visit | Attending: Gastroenterology

## 2012-06-03 ENCOUNTER — Ambulatory Visit (HOSPITAL_COMMUNITY)
Admission: RE | Admit: 2012-06-03 | Discharge: 2012-06-03 | Disposition: A | Discharge status: Home / Self Care | Payer: Medicare Other | Source: Ambulatory Visit | Attending: Gastroenterology | Admitting: Gastroenterology

## 2012-06-03 ENCOUNTER — Encounter (HOSPITAL_COMMUNITY): Payer: Self-pay

## 2012-06-03 DIAGNOSIS — K2940 Chronic atrophic gastritis without bleeding: Secondary | ICD-10-CM | POA: Diagnosis not present

## 2012-06-03 DIAGNOSIS — Z1211 Encounter for screening for malignant neoplasm of colon: Secondary | ICD-10-CM | POA: Diagnosis not present

## 2012-06-03 DIAGNOSIS — K2990 Gastroduodenitis, unspecified, without bleeding: Secondary | ICD-10-CM

## 2012-06-03 DIAGNOSIS — K648 Other hemorrhoids: Secondary | ICD-10-CM | POA: Diagnosis not present

## 2012-06-03 DIAGNOSIS — K219 Gastro-esophageal reflux disease without esophagitis: Secondary | ICD-10-CM

## 2012-06-03 DIAGNOSIS — I10 Essential (primary) hypertension: Secondary | ICD-10-CM | POA: Diagnosis not present

## 2012-06-03 DIAGNOSIS — K2970 Gastritis, unspecified, without bleeding: Secondary | ICD-10-CM

## 2012-06-03 DIAGNOSIS — M2550 Pain in unspecified joint: Secondary | ICD-10-CM | POA: Diagnosis not present

## 2012-06-03 DIAGNOSIS — R1310 Dysphagia, unspecified: Secondary | ICD-10-CM | POA: Insufficient documentation

## 2012-06-03 HISTORY — PX: FLEXIBLE SIGMOIDOSCOPY: SHX5431

## 2012-06-03 HISTORY — DX: Other complications of anesthesia, initial encounter: T88.59XA

## 2012-06-03 HISTORY — PX: ESOPHAGOGASTRODUODENOSCOPY (EGD) WITH ESOPHAGEAL DILATION: SHX5812

## 2012-06-03 HISTORY — DX: Nausea with vomiting, unspecified: R11.2

## 2012-06-03 HISTORY — DX: Adverse effect of unspecified anesthetic, initial encounter: T41.45XA

## 2012-06-03 HISTORY — DX: Other specified postprocedural states: Z98.890

## 2012-06-03 SURGERY — ESOPHAGOGASTRODUODENOSCOPY (EGD) WITH ESOPHAGEAL DILATION
Anesthesia: Moderate Sedation

## 2012-06-03 MED ORDER — MIDAZOLAM HCL 5 MG/5ML IJ SOLN
INTRAMUSCULAR | Status: DC | PRN
  Administered 2012-06-03: 1 mg via INTRAVENOUS
  Administered 2012-06-03: 2 mg via INTRAVENOUS
  Administered 2012-06-03 (×2): 1 mg via INTRAVENOUS

## 2012-06-03 MED ORDER — STERILE WATER FOR IRRIGATION IR SOLN
Status: DC | PRN
  Administered 2012-06-03: 09:00:00

## 2012-06-03 MED ORDER — MIDAZOLAM HCL 5 MG/5ML IJ SOLN
INTRAMUSCULAR | Status: AC
  Filled 2012-06-03: qty 10

## 2012-06-03 MED ORDER — MEPERIDINE HCL 100 MG/ML IJ SOLN
INTRAMUSCULAR | Status: DC | PRN
  Administered 2012-06-03 (×3): 25 mg via INTRAVENOUS

## 2012-06-03 MED ORDER — MINERAL OIL PO OIL
TOPICAL_OIL | ORAL | Status: AC
  Filled 2012-06-03: qty 30

## 2012-06-03 MED ORDER — MEPERIDINE HCL 100 MG/ML IJ SOLN
INTRAMUSCULAR | Status: AC
  Filled 2012-06-03: qty 2
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
## 2012-06-03 NOTE — Op Note (Addendum)
[facility] [address] [place], [address]   FLEX SIGMOIDOSCOPY PROCEDURE REPORT  PATIENT: Andrea Santiago, Andrea Santiago  MR#: [id number] BIRTHDATE: 1941-02-06 , 71  yrs. old GENDER: Female ENDOSCOPIST: Jonette Eva, MD REFERRED [id number] Lodema Hong, M.D. PROCEDURE DATE:  06/03/2012 PROCEDURE:   Sigmoidoscopy, screening -INCOMPLETE TCS DUE TO POOR BOWEL PREP INDICATIONS:average risk patient for colon cancer. MEDICATIONS: Demerol 75 mg IV and Versed 4 mg IV  DESCRIPTION OF PROCEDURE:    Physical exam was performed.  Informed consent was obtained from the patient after explaining the benefits, risks, and alternatives to procedure.  The patient was connected to monitor and placed in left lateral position. Continuous oxygen was provided by nasal cannula and IV medicine administered through an indwelling cannula.  After administration of sedation and rectal exam, the patients rectum was intubated and the EC-3890Li ([id number])  colonoscope was advanced under direct visualization to the PROXIMAL DESCENDING COLON.  The scope was removed slowly by carefully examining the color, texture, anatomy, and integrity mucosa on the way out.  The patient was recovered in endoscopy and discharged home in satisfactory condition.       COLON FINDINGS: The colonic mucosa appeared normal and Small internal hemorrhoids were found.  PREP QUALITY: inadequate FOR TCS CECAL W/D TIME: N/A  COMPLICATIONS: None  ENDOSCOPIC IMPRESSION: 1.   The colonic mucosa appeared normal 2.   Small internal hemorrhoids   RECOMMENDATIONS: HIGH FIBER DIET NEXT TCS WITH PROPOFOL IN 5 YEARS       _______________________________ Andrea DoctorJonette Eva, MD 06/11/2012 7:36 AM Revised: 06/11/2012 7:36 AM

## 2012-06-03 NOTE — Op Note (Signed)
[facility] [address] [place], [address]   ENDOSCOPY PROCEDURE REPORT  PATIENT: Grettell, Ransdell  MR#: [id number] BIRTHDATE: 12-Apr-1940 , 71  yrs. old GENDER: Female  ENDOSCOPIST: Jonette Eva, MD REFFERED XB:[id number] Lodema Hong, M.D.  PROCEDURE DATE:  06/03/2012 PROCEDURE:   EGD with biopsy and EGD with dilatation over guidewire   INDICATIONS:1.  dysphagia. PMHx: GERD ON OMEPRAZOLE 40 MG ONE DAILY/ASA/MOBIC. MEDICATIONS: TCS + Demerol 25 mg IV TOPICAL ANESTHETIC: Cetacaine Spray  DESCRIPTION OF PROCEDURE:   After the risks benefits and alternatives of the procedure were thoroughly explained, informed consent was obtained.  The EC-3890Li ([id number]) and EG-[id number] ([id number])  endoscope was introduced through the mouth and advanced to the second portion of the duodenum. The instrument was slowly withdrawn as the mucosa was carefully examined.  Prior to withdrawal of the scope, the guidwire was placed.  The esophagus was dilated successfully.  The patient was recovered in endoscopy and discharged home in satisfactory condition.   ESOPHAGUS: The mucosa of the esophagus appeared normal.   STOMACH: Moderate non-erosive gastritis (inflammation) was found in the gastric antrum.  Multiple biopsies were performed.   DUODENUM: The duodenal mucosa showed no abnormalities in the bulb and second portion of the duodenum.  Dilation was then performed at the proximal esophagus Dilator: Savary over guidewire Size(s): 15-16 MM Resistance: moderate Heme: yes  COMPLICATIONS: There were no complications.  ENDOSCOPIC IMPRESSION: 1.   EMPIRIC DILATION DUE TO C/O DYSPHAGIA 2.   MODERATE Non-erosive gastritis  RECOMMENDATIONS: CONTINUE OMEPRAZOLE 30 MINUTES PRIOR TO YOUR FIRST MEAL. FOLLOW A HIGH FIBER/LOW FAT DIET.  AVOID ITEMS THAT CAUSE BLOATING.  BIOPSY WILL BE BACK IN 7 DAYS. Follow up in 4 mos. Next colonoscopy in 5 years WITH  PROPOFOL.      _______________________________ Rosalie DoctorJonette Eva, MD 06/03/2012 9:34 AM

## 2012-06-05 ENCOUNTER — Encounter (HOSPITAL_COMMUNITY): Payer: Self-pay | Admitting: Gastroenterology

## 2012-06-10 ENCOUNTER — Other Ambulatory Visit: Payer: Self-pay | Admitting: Family Medicine

## 2012-06-10 ENCOUNTER — Telehealth: Payer: Self-pay | Admitting: Gastroenterology

## 2012-06-10 DIAGNOSIS — M2550 Pain in unspecified joint: Secondary | ICD-10-CM | POA: Diagnosis not present

## 2012-06-10 DIAGNOSIS — E559 Vitamin D deficiency, unspecified: Secondary | ICD-10-CM | POA: Diagnosis not present

## 2012-06-10 NOTE — Telephone Encounter (Signed)
LMOM to call.

## 2012-06-10 NOTE — Telephone Encounter (Signed)
Please call pt. HER stomach Bx shows gastritis DUE TO ASA.    CONTINUE OMEPRAZOLE 30  MINUTES PRIOR TO YOUR FIRST MEAL.   FOLLOW A HIGH FIBER/LOW FAT DIET. AVOID ITEMS THAT CAUSE BLOATING.   Follow up in 4 mos SLF E15.  Next colonoscopy in 5 years WITH PROPOFOL AND CLEAR LIQUIDS 36 HOURS PRIOR TO TCS.

## 2012-06-10 NOTE — Telephone Encounter (Signed)
Results forwarded to PCP

## 2012-06-11 NOTE — Telephone Encounter (Signed)
Called and informed pt.  

## 2012-06-11 NOTE — Telephone Encounter (Signed)
Reminder in epic °

## 2012-06-17 NOTE — Telephone Encounter (Signed)
noted 

## 2012-06-18 ENCOUNTER — Other Ambulatory Visit: Payer: Self-pay | Admitting: Family Medicine

## 2012-06-24 ENCOUNTER — Other Ambulatory Visit: Payer: Self-pay | Admitting: Family Medicine

## 2012-07-02 ENCOUNTER — Other Ambulatory Visit: Payer: Self-pay | Admitting: Family Medicine

## 2012-07-10 ENCOUNTER — Other Ambulatory Visit: Payer: Self-pay | Admitting: Family Medicine

## 2012-07-28 ENCOUNTER — Other Ambulatory Visit: Payer: Self-pay | Admitting: Family Medicine

## 2012-08-10 NOTE — Progress Notes (Signed)
REVIEWED.  TCS-->FSIG APR 2014, SML IH Results EGD/DIL APR 2014 GASTRITIS

## 2012-08-19 ENCOUNTER — Ambulatory Visit: Payer: Medicare Other | Admitting: Family Medicine

## 2012-08-21 ENCOUNTER — Ambulatory Visit: Payer: Medicare Other | Admitting: Family Medicine

## 2012-08-29 ENCOUNTER — Ambulatory Visit: Payer: Medicare Other | Admitting: Family Medicine

## 2012-09-04 DIAGNOSIS — M5137 Other intervertebral disc degeneration, lumbosacral region: Secondary | ICD-10-CM | POA: Diagnosis not present

## 2012-09-04 DIAGNOSIS — M19049 Primary osteoarthritis, unspecified hand: Secondary | ICD-10-CM | POA: Diagnosis not present

## 2012-09-04 DIAGNOSIS — M1710 Unilateral primary osteoarthritis, unspecified knee: Secondary | ICD-10-CM | POA: Diagnosis not present

## 2012-09-04 DIAGNOSIS — M542 Cervicalgia: Secondary | ICD-10-CM | POA: Diagnosis not present

## 2012-09-04 DIAGNOSIS — M5030 Other cervical disc degeneration, unspecified cervical region: Secondary | ICD-10-CM | POA: Diagnosis not present

## 2012-09-10 DIAGNOSIS — R7301 Impaired fasting glucose: Secondary | ICD-10-CM | POA: Diagnosis not present

## 2012-09-10 DIAGNOSIS — E785 Hyperlipidemia, unspecified: Secondary | ICD-10-CM | POA: Diagnosis not present

## 2012-09-10 LAB — HEMOGLOBIN A1C
Hgb A1c MFr Bld: 5.8 % — ABNORMAL HIGH
Mean Plasma Glucose: 120 mg/dL — ABNORMAL HIGH

## 2012-09-11 ENCOUNTER — Encounter: Payer: Self-pay | Admitting: Family Medicine

## 2012-09-11 ENCOUNTER — Telehealth: Payer: Self-pay | Admitting: Family Medicine

## 2012-09-11 ENCOUNTER — Ambulatory Visit (INDEPENDENT_AMBULATORY_CARE_PROVIDER_SITE_OTHER): Payer: Medicare Other | Admitting: Family Medicine

## 2012-09-11 VITALS — BP 120/70 | HR 73 | Resp 16 | Ht 64.0 in | Wt 162.0 lb

## 2012-09-11 DIAGNOSIS — IMO0001 Reserved for inherently not codable concepts without codable children: Secondary | ICD-10-CM

## 2012-09-11 DIAGNOSIS — I10 Essential (primary) hypertension: Secondary | ICD-10-CM

## 2012-09-11 DIAGNOSIS — Z1239 Encounter for other screening for malignant neoplasm of breast: Secondary | ICD-10-CM

## 2012-09-11 DIAGNOSIS — Z13820 Encounter for screening for osteoporosis: Secondary | ICD-10-CM | POA: Diagnosis not present

## 2012-09-11 DIAGNOSIS — E785 Hyperlipidemia, unspecified: Secondary | ICD-10-CM | POA: Diagnosis not present

## 2012-09-11 DIAGNOSIS — E663 Overweight: Secondary | ICD-10-CM

## 2012-09-11 DIAGNOSIS — R7301 Impaired fasting glucose: Secondary | ICD-10-CM

## 2012-09-11 DIAGNOSIS — F329 Major depressive disorder, single episode, unspecified: Secondary | ICD-10-CM

## 2012-09-11 LAB — COMPLETE METABOLIC PANEL WITH GFR
ALT: 23 U/L (ref 0–35)
AST: 23 U/L (ref 0–37)
Albumin: 4.5 g/dL (ref 3.5–5.2)
Alkaline Phosphatase: 40 U/L (ref 39–117)
BUN: 16 mg/dL (ref 6–23)
CO2: 25 mEq/L (ref 19–32)
Calcium: 10.1 mg/dL (ref 8.4–10.5)
Chloride: 102 mEq/L (ref 96–112)
Creat: 0.87 mg/dL (ref 0.50–1.10)
GFR, Est African American: 76 mL/min
GFR, Est Non African American: 66 mL/min
Glucose, Bld: 91 mg/dL (ref 70–99)
Potassium: 4.1 mEq/L (ref 3.5–5.3)
Sodium: 139 mEq/L (ref 135–145)
Total Bilirubin: 0.5 mg/dL (ref 0.3–1.2)
Total Protein: 7.5 g/dL (ref 6.0–8.3)

## 2012-09-11 LAB — LIPID PANEL
Cholesterol: 178 mg/dL (ref 0–200)
HDL: 62 mg/dL (ref >39)
LDL Cholesterol: 103 mg/dL — ABNORMAL HIGH (ref 0–99)
Total CHOL/HDL Ratio: 2.9 Ratio
Triglycerides: 67 mg/dL (ref <150)
VLDL: 13 mg/dL (ref 0–40)

## 2012-09-11 MED ORDER — TIZANIDINE HCL 4 MG PO TABS: ORAL_TABLET | ORAL | Status: DC

## 2012-09-11 NOTE — Assessment & Plan Note (Signed)
Unchanged, has sen rheumatology, possibility of alternative new drug to cymbalta raised, pt to check into coverage before taper

## 2012-09-11 NOTE — Assessment & Plan Note (Signed)
Controlled, no change in medication DASH diet and commitment to daily physical activity for a minimum of 30 minutes discussed and encouraged, as a part of hypertension management. The importance of attaining a healthy weight is also discussed.  

## 2012-09-11 NOTE — Patient Instructions (Addendum)
F/u in early December, call if you need me before  Pls call in October for the flu vaccine   Labs are much better, congrats,  Only med change is reduction  In zanaflex to twice daily  Please contact your insurance re coverage for the "new medication" your rheumatologist discussed, before I start the cymbalta taper   HBA1c, fasting lipid, cmp in early December before next visit  You are referred for a mammogram and also for a bone density scan, please check with referral staff on your way out for your appts

## 2012-09-11 NOTE — Assessment & Plan Note (Signed)
Improved. Pt applauded on succesful weight loss through lifestyle change, and encouraged to continue same. Weight loss goal set for the next several months.  

## 2012-09-11 NOTE — Progress Notes (Signed)
  Subjective:    Patient ID: Andrea Santiago, female    DOB: 02-22-1940, 72 y.o.   MRN: [id number]  HPI The PT is here for follow up and re-evaluation of chronic medical conditions, medication management and review of any available recent lab and radiology data.  Preventive health is updated, specifically  Cancer screening and Immunization.   Questions or concerns regarding consultations or procedures which the PT has had in the interim are  Addressed.Has seen rheumatologist twice, new medication to replace cymbalta a possibility, pt to check into coverage before taper. Pain is unchanged. Pool was suggested, by rheumatologist, pt not keen states water was "cold" and she felt worse, acupuncture suggested , Gibson will look into coverage and get back to me The PT denies any adverse reactions to current medications since the last visit.  There are no new concerns.  There are no specific complaints       Review of Systems See HPI Denies recent fever or chills. Denies sinus pressure, nasal congestion, ear pain or sore throat. Denies chest congestion, productive cough or wheezing. Denies chest pains, palpitations and leg swelling Denies abdominal pain, nausea, vomiting,diarrhea or constipation.   Denies dysuria, frequency, hesitancy or incontinence. . Denies headaches, seizures, numbness, or tingling. Denies uncontrolled  depression, anxiety or insomnia. Denies skin break down or rash.        Objective:   Physical Exam  Patient alert and oriented and in no cardiopulmonary distress.  HEENT: No facial asymmetry, EOMI, no sinus tenderness,  oropharynx pink and moist.  Neck supple no adenopathy.  Chest: Clear to auscultation bilaterally.  CVS: S1, S2 no murmurs, no S3.  ABD: Soft non tender. Bowel sounds normal.  Ext: No edema  MS: decreased  ROM spine, shoulders, hips and knees.  Skin: Intact, no ulcerations or rash noted.  Psych: Good eye contact, normal affect. Memory intact  not anxious or depressed appearing.  CNS: CN 2-12 intact, power, tone and sensation normal throughout.       Assessment & Plan:

## 2012-09-11 NOTE — Assessment & Plan Note (Signed)
Hyperlipidemia:Low fat diet discussed and encouraged.  Improved, no med change    

## 2012-09-11 NOTE — Assessment & Plan Note (Signed)
Controlled, no change in medication  

## 2012-09-11 NOTE — Assessment & Plan Note (Signed)
Improved, pt applauded on thsi and encouraged to continue in this direction

## 2012-09-12 MED ORDER — OMEPRAZOLE 20 MG PO CPDR: 20.0000 mg | DELAYED_RELEASE_CAPSULE | Freq: Every day | ORAL | Status: DC

## 2012-09-12 MED ORDER — FENOFIBRIC ACID 135 MG PO CPDR: 135.0000 mg | DELAYED_RELEASE_CAPSULE | Freq: Every day | ORAL | Status: DC

## 2012-09-12 NOTE — Telephone Encounter (Signed)
Patient is aware 

## 2012-09-12 NOTE — Addendum Note (Signed)
Addended by: Kandis Fantasia B on: 09/12/2012 01:00 PM   Modules accepted: Orders, Medications

## 2012-09-17 ENCOUNTER — Ambulatory Visit (HOSPITAL_COMMUNITY)
Admission: RE | Admit: 2012-09-17 | Discharge: 2012-09-17 | Disposition: A | Discharge status: Home / Self Care | Payer: Medicare Other | Source: Ambulatory Visit | Attending: Family Medicine | Admitting: Family Medicine

## 2012-09-17 DIAGNOSIS — Z1231 Encounter for screening mammogram for malignant neoplasm of breast: Secondary | ICD-10-CM | POA: Diagnosis not present

## 2012-09-17 DIAGNOSIS — Z13820 Encounter for screening for osteoporosis: Secondary | ICD-10-CM

## 2012-09-17 DIAGNOSIS — M899 Disorder of bone, unspecified: Secondary | ICD-10-CM | POA: Insufficient documentation

## 2012-09-17 DIAGNOSIS — Z1239 Encounter for other screening for malignant neoplasm of breast: Secondary | ICD-10-CM

## 2012-09-17 DIAGNOSIS — M949 Disorder of cartilage, unspecified: Secondary | ICD-10-CM | POA: Diagnosis not present

## 2012-09-18 ENCOUNTER — Other Ambulatory Visit: Payer: Self-pay | Admitting: Family Medicine

## 2012-09-18 MED ORDER — CALCIUM CARBONATE-VITAMIN D 500-200 MG-UNIT PO TABS: 1.0000 | ORAL_TABLET | Freq: Two times a day (BID) | ORAL | Status: DC

## 2012-09-30 ENCOUNTER — Other Ambulatory Visit: Payer: Self-pay | Admitting: Family Medicine

## 2012-10-01 ENCOUNTER — Encounter: Payer: Self-pay | Admitting: Family Medicine

## 2012-10-01 ENCOUNTER — Ambulatory Visit (INDEPENDENT_AMBULATORY_CARE_PROVIDER_SITE_OTHER): Payer: Medicare Other | Admitting: Family Medicine

## 2012-10-01 VITALS — BP 144/76 | HR 80 | Resp 18 | Ht 64.0 in | Wt 165.1 lb

## 2012-10-01 DIAGNOSIS — I10 Essential (primary) hypertension: Secondary | ICD-10-CM

## 2012-10-01 DIAGNOSIS — N3000 Acute cystitis without hematuria: Secondary | ICD-10-CM

## 2012-10-01 DIAGNOSIS — R5383 Other fatigue: Secondary | ICD-10-CM

## 2012-10-01 DIAGNOSIS — R5381 Other malaise: Secondary | ICD-10-CM

## 2012-10-01 DIAGNOSIS — F329 Major depressive disorder, single episode, unspecified: Secondary | ICD-10-CM

## 2012-10-01 DIAGNOSIS — J0100 Acute maxillary sinusitis, unspecified: Secondary | ICD-10-CM | POA: Insufficient documentation

## 2012-10-01 LAB — CBC WITH DIFFERENTIAL/PLATELET
Basophils Absolute: 0 10*3/uL (ref 0.0–0.1)
Basophils Relative: 0 % (ref 0–1)
Eosinophils Absolute: 0.1 10*3/uL (ref 0.0–0.7)
Eosinophils Relative: 1 % (ref 0–5)
HCT: 40.9 % (ref 36.0–46.0)
Hemoglobin: 14.2 g/dL (ref 12.0–15.0)
Lymphocytes Relative: 38 % (ref 12–46)
Lymphs Abs: 3 10*3/uL (ref 0.7–4.0)
MCH: 30.5 pg (ref 26.0–34.0)
MCHC: 34.7 g/dL (ref 30.0–36.0)
MCV: 88 fL (ref 78.0–100.0)
Monocytes Absolute: 0.8 10*3/uL (ref 0.1–1.0)
Monocytes Relative: 10 % (ref 3–12)
Neutro Abs: 4.1 10*3/uL (ref 1.7–7.7)
Neutrophils Relative %: 51 % (ref 43–77)
Platelets: 307 10*3/uL (ref 150–400)
RBC: 4.65 MIL/uL (ref 3.87–5.11)
RDW: 14.5 % (ref 11.5–15.5)
WBC: 8 10*3/uL (ref 4.0–10.5)

## 2012-10-01 LAB — POCT URINALYSIS DIPSTICK
Bilirubin, UA: NEGATIVE
Blood, UA: NEGATIVE
Glucose, UA: NEGATIVE
Ketones, UA: NEGATIVE
Leukocytes, UA: NEGATIVE
Nitrite, UA: NEGATIVE
Protein, UA: NEGATIVE
Spec Grav, UA: 1.02
Urobilinogen, UA: 2
pH, UA: 7

## 2012-10-01 MED ORDER — AZITHROMYCIN 250 MG PO TABS: ORAL_TABLET | ORAL | Status: AC

## 2012-10-01 NOTE — Progress Notes (Signed)
  Subjective:    Patient ID: Andrea Santiago, female    DOB: 01/07/1941, 72 y.o.   MRN: [id number]  HPI 1 day h/oorange d urine, with frequency, denies chills fever or flank pain. No h/o kidney stones, first episode, frequency noted , no dysuria Otherwise was in her usual state of health, except for 2 day h/o left maxillary sinus pressure with green nasal drainage Denies ingestion of pyridium or any food which could cause urine to be discolored   Review of Systems See HPI Denies recent fever or chills. C/o  sinus pressure,denies  nasal congestion, ear pain or sore throat. Denies chest congestion, productive cough or wheezing. Denies chest pains, palpitations and leg swelling Denies abdominal pain, nausea, vomiting,diarrhea or constipation.    Chronic  joint pain,  and limitation in mobility.Unchanged Denies headaches, seizures, numbness, or tingling. Denies depression, anxiety or insomnia. Denies skin break down or rash.        Objective:   Physical Exam Patient alert and oriented and in no cardiopulmonary distress.  HEENT: No facial asymmetry, EOMI, maxillary sinus tenderness,  oropharynx pink and moist.  Neck supple no adenopathy.TM clear  Chest: Clear to auscultation bilaterally.  CVS: S1, S2 no murmurs, no S3.  ABD: Soft non tender. Bowel sounds normal.No renal angle or suprapubic tenderness, UA negative  Ext: No edema  MS: Adequate though reduced  ROM spine, shoulders, hips and knees.  Skin: Intact, no ulcerations or rash noted.  Psych: Good eye contact, normal affect. Memory intact  anxious not  depressed appearing.  CNS: CN 2-12 intact, power, tone and sensation normal throughout.        Assessment & Plan:

## 2012-10-01 NOTE — Patient Instructions (Addendum)
F/u as before.  Urine shows no sign of infection, we will send it for culture based on reported symoptoms however.  I am concerned about your respiratory symptoms, you will be treated for sinusitis.  Call within the next week , if urinary symptoms, pressure, frequency and color remain a concern so I can look further into any possible kidney or bladder problems with referrals for imaging and possibly to see a urologist  cBC and diff today due to c/o excessive fatigue and chills

## 2012-10-04 LAB — URINE CULTURE
Colony Count: NO GROWTH
Organism ID, Bacteria: NO GROWTH

## 2012-10-06 DIAGNOSIS — N3000 Acute cystitis without hematuria: Secondary | ICD-10-CM | POA: Insufficient documentation

## 2012-10-06 NOTE — Assessment & Plan Note (Signed)
Tender over left maxillary sinus with  Green drainage will treat with z pack

## 2012-10-06 NOTE — Assessment & Plan Note (Signed)
Though pt symptomatic. UA is negative, will send for c/s based on history alone however, since this was the main concern which brought her in to the office

## 2012-10-06 NOTE — Assessment & Plan Note (Signed)
Controlled, no change in medication  

## 2012-11-20 ENCOUNTER — Encounter: Payer: Self-pay | Admitting: Family Medicine

## 2012-11-20 ENCOUNTER — Ambulatory Visit (INDEPENDENT_AMBULATORY_CARE_PROVIDER_SITE_OTHER): Payer: Medicare Other | Admitting: Family Medicine

## 2012-11-20 VITALS — BP 130/70 | HR 73 | Resp 16 | Ht 64.0 in | Wt 166.4 lb

## 2012-11-20 DIAGNOSIS — F3289 Other specified depressive episodes: Secondary | ICD-10-CM

## 2012-11-20 DIAGNOSIS — I10 Essential (primary) hypertension: Secondary | ICD-10-CM

## 2012-11-20 DIAGNOSIS — IMO0002 Reserved for concepts with insufficient information to code with codable children: Secondary | ICD-10-CM

## 2012-11-20 DIAGNOSIS — R5381 Other malaise: Secondary | ICD-10-CM

## 2012-11-20 DIAGNOSIS — Z23 Encounter for immunization: Secondary | ICD-10-CM | POA: Diagnosis not present

## 2012-11-20 DIAGNOSIS — R52 Pain, unspecified: Secondary | ICD-10-CM

## 2012-11-20 DIAGNOSIS — R0789 Other chest pain: Secondary | ICD-10-CM

## 2012-11-20 DIAGNOSIS — K219 Gastro-esophageal reflux disease without esophagitis: Secondary | ICD-10-CM

## 2012-11-20 DIAGNOSIS — R5383 Other fatigue: Secondary | ICD-10-CM

## 2012-11-20 DIAGNOSIS — F329 Major depressive disorder, single episode, unspecified: Secondary | ICD-10-CM

## 2012-11-20 MED ORDER — PREDNISONE 5 MG PO TABS: 5.0000 mg | ORAL_TABLET | Freq: Two times a day (BID) | ORAL | Status: AC

## 2012-11-20 MED ORDER — POTASSIUM CHLORIDE ER 10 MEQ PO TBCR: EXTENDED_RELEASE_TABLET | ORAL | Status: DC

## 2012-11-20 MED ORDER — METHYLPREDNISOLONE ACETATE 80 MG/ML IJ SUSP
80.0000 mg | Freq: Once | INTRAMUSCULAR | Status: AC
  Administered 2012-11-20: 80 mg via INTRAMUSCULAR

## 2012-11-20 MED ORDER — TRAZODONE HCL 150 MG PO TABS: ORAL_TABLET | ORAL | Status: DC

## 2012-11-20 MED ORDER — KETOROLAC TROMETHAMINE 60 MG/2ML IM SOLN
60.0000 mg | Freq: Once | INTRAMUSCULAR | Status: AC
  Administered 2012-11-20: 60 mg via INTRAMUSCULAR

## 2012-11-20 NOTE — Progress Notes (Signed)
  Subjective:    Patient ID: Andrea Santiago, female    DOB: 1940/05/16, 72 y.o.   MRN: [id number]  HPI 2   Month h/o exertional fatigue, in the past 2 months has also noted palpitations for less than 2 minutes with no light headedness on avg once weekly, has developed right sided chest pain almost takes her breath away when it occurs, from mid back , around right lower breast to mid sternum Occurs while she is sitting up to 3 times per week, so severe that it almos takes her breath away, up to 5 minutes each time. 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 Denies sinus pressure, nasal congestion, ear pain or sore throat. Denies chest congestion, productive cough or wheezing.  Denies abdominal pain, nausea, vomiting,diarrhea or constipation.   Denies dysuria, frequency, hesitancy or incontinence. Denies headaches, seizures, numbness, or tingling. Denies uncontrolled  depression, anxiety or insomnia. Denies skin break down or rash.        Objective:   Physical Exam Patient alert and oriented and in no cardiopulmonary distress.  HEENT: No facial asymmetry, EOMI, no sinus tenderness,  oropharynx pink and moist.  Neck supple no adenopathy.  Chest: Clear to auscultation bilaterally.  CVS: S1, S2 no murmurs, no S3.EKG: NSR, no ischemic changes  ABD: Soft non tender. Bowel sounds normal.  Ext: No edema  MS: Decreased  ROM spine, adequate in shoulders, hips and knees.  Skin: Intact, no ulcerations or rash noted.  Psych: Good eye contact, normal affect. Memory intact not anxious or depressed appearing.  CNS: CN 2-12 intact, power, tone and sensation normal throughout.        Assessment & Plan:

## 2012-11-20 NOTE — Patient Instructions (Addendum)
Pelvic and breast exam in 4 month, call if you need me before  Flu vaccine in office today  Toradol 60mg  and depo medrol 80mg  Im for generalized pain followed by prednisone for 5 days (sent to your pharmacy)   EKG in office today due to c/o exertional fatigue and shortness of breath , and you will be referred to cardiology if abnormal   YOu are referred for an MRI of thoracic spine to evaluate the new  pain

## 2012-11-24 NOTE — Assessment & Plan Note (Signed)
Controlled, no change in medication  

## 2012-11-24 NOTE — Assessment & Plan Note (Signed)
Uncontrolled with flare in thoracic spine are , needs updated MRi thoracic spine based on symptoms. Toradol and depomedrol in office followed by 5 day course of prednisone

## 2012-11-24 NOTE — Assessment & Plan Note (Signed)
ekg in office today , normal

## 2012-11-24 NOTE — Assessment & Plan Note (Signed)
Worsening fatigue with chest pain, offic e EKG normal, no furhter cardiac w/up at this time

## 2012-11-24 NOTE — Assessment & Plan Note (Signed)
Uncontrolled generalized pain anti inflammatories in office

## 2012-11-26 ENCOUNTER — Other Ambulatory Visit: Payer: Self-pay | Admitting: Family Medicine

## 2012-11-26 ENCOUNTER — Ambulatory Visit: Payer: Medicare Other | Admitting: Family Medicine

## 2012-11-27 ENCOUNTER — Ambulatory Visit (HOSPITAL_COMMUNITY)
Admission: RE | Admit: 2012-11-27 | Discharge: 2012-11-27 | Disposition: A | Discharge status: Home / Self Care | Payer: Medicare Other | Source: Ambulatory Visit | Attending: Family Medicine | Admitting: Family Medicine

## 2012-11-27 ENCOUNTER — Encounter (HOSPITAL_COMMUNITY): Payer: Self-pay

## 2012-11-27 DIAGNOSIS — M5124 Other intervertebral disc displacement, thoracic region: Secondary | ICD-10-CM | POA: Insufficient documentation

## 2012-11-27 DIAGNOSIS — M47812 Spondylosis without myelopathy or radiculopathy, cervical region: Secondary | ICD-10-CM | POA: Diagnosis not present

## 2012-11-27 DIAGNOSIS — IMO0002 Reserved for concepts with insufficient information to code with codable children: Secondary | ICD-10-CM

## 2012-11-27 DIAGNOSIS — M47814 Spondylosis without myelopathy or radiculopathy, thoracic region: Secondary | ICD-10-CM | POA: Diagnosis not present

## 2012-12-04 ENCOUNTER — Ambulatory Visit: Payer: Medicare Other | Admitting: Family Medicine

## 2012-12-04 ENCOUNTER — Encounter: Payer: Self-pay | Admitting: Family Medicine

## 2012-12-05 ENCOUNTER — Ambulatory Visit (INDEPENDENT_AMBULATORY_CARE_PROVIDER_SITE_OTHER): Payer: Medicare Other | Admitting: Otolaryngology

## 2012-12-22 ENCOUNTER — Other Ambulatory Visit: Payer: Self-pay | Admitting: Family Medicine

## 2012-12-25 ENCOUNTER — Telehealth: Payer: Self-pay

## 2012-12-25 NOTE — Telephone Encounter (Signed)
Patient states she has bronchitis and I offered her an appt but she is unable to come tomorrow afternoon and we have no morning openings. Has been coughing x 1 week producing green phlegm, sinus congestion and facial pain. Denies fever chills or bodyaches. Please advise

## 2012-12-25 NOTE — Telephone Encounter (Signed)
Ipls send in tessalon perles 100mg  3 times daily for 1 week only, let her know needs oV before antibiotic  Can be prescribed, see iif really unable/unwilling to come when we have availablity, but that's her optio for this week. (may also be able to "work with her" if someone cancels / no show)

## 2012-12-26 MED ORDER — BENZONATATE 100 MG PO CAPS: 100.0000 mg | ORAL_CAPSULE | Freq: Three times a day (TID) | ORAL | Status: DC | PRN

## 2012-12-26 NOTE — Telephone Encounter (Signed)
Patient states she feels worse and may go to the urgent care as advised since no appts available. Tessalon perles sent in

## 2012-12-27 ENCOUNTER — Encounter (HOSPITAL_COMMUNITY): Payer: Self-pay | Admitting: Emergency Medicine

## 2012-12-27 ENCOUNTER — Emergency Department (HOSPITAL_COMMUNITY): Payer: Medicare Other

## 2012-12-27 ENCOUNTER — Emergency Department (HOSPITAL_COMMUNITY)
Admission: EM | Admit: 2012-12-27 | Discharge: 2012-12-27 | Disposition: A | Discharge status: Home / Self Care | Payer: Medicare Other | Attending: Emergency Medicine | Admitting: Emergency Medicine

## 2012-12-27 DIAGNOSIS — E785 Hyperlipidemia, unspecified: Secondary | ICD-10-CM | POA: Diagnosis not present

## 2012-12-27 DIAGNOSIS — J069 Acute upper respiratory infection, unspecified: Secondary | ICD-10-CM | POA: Insufficient documentation

## 2012-12-27 DIAGNOSIS — J40 Bronchitis, not specified as acute or chronic: Secondary | ICD-10-CM

## 2012-12-27 DIAGNOSIS — J209 Acute bronchitis, unspecified: Secondary | ICD-10-CM | POA: Insufficient documentation

## 2012-12-27 DIAGNOSIS — F3289 Other specified depressive episodes: Secondary | ICD-10-CM | POA: Insufficient documentation

## 2012-12-27 DIAGNOSIS — Z79899 Other long term (current) drug therapy: Secondary | ICD-10-CM | POA: Diagnosis not present

## 2012-12-27 DIAGNOSIS — F329 Major depressive disorder, single episode, unspecified: Secondary | ICD-10-CM | POA: Diagnosis not present

## 2012-12-27 DIAGNOSIS — Z8669 Personal history of other diseases of the nervous system and sense organs: Secondary | ICD-10-CM | POA: Diagnosis not present

## 2012-12-27 DIAGNOSIS — R05 Cough: Secondary | ICD-10-CM | POA: Diagnosis not present

## 2012-12-27 DIAGNOSIS — I10 Essential (primary) hypertension: Secondary | ICD-10-CM | POA: Insufficient documentation

## 2012-12-27 DIAGNOSIS — Z7982 Long term (current) use of aspirin: Secondary | ICD-10-CM | POA: Diagnosis not present

## 2012-12-27 DIAGNOSIS — Z87891 Personal history of nicotine dependence: Secondary | ICD-10-CM | POA: Insufficient documentation

## 2012-12-27 DIAGNOSIS — K219 Gastro-esophageal reflux disease without esophagitis: Secondary | ICD-10-CM | POA: Diagnosis not present

## 2012-12-27 DIAGNOSIS — M810 Age-related osteoporosis without current pathological fracture: Secondary | ICD-10-CM | POA: Insufficient documentation

## 2012-12-27 DIAGNOSIS — IMO0001 Reserved for inherently not codable concepts without codable children: Secondary | ICD-10-CM | POA: Diagnosis not present

## 2012-12-27 MED ORDER — AZITHROMYCIN 250 MG PO TABS: 250.0000 mg | ORAL_TABLET | Freq: Every day | ORAL | Status: DC

## 2012-12-27 NOTE — ED Notes (Signed)
Patient with c/o cough, fever x 2 days. Dry, non-productive cough noted. Hoarse.
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
## 2012-12-27 NOTE — ED Notes (Signed)
Patient with no complaints at this time. Respirations even and unlabored. Skin warm/dry. Discharge instructions reviewed with patient at this time. Patient given opportunity to voice concerns/ask questions. Patient discharged at this time and left Emergency Department with steady gait.   

## 2012-12-30 ENCOUNTER — Other Ambulatory Visit: Payer: Self-pay | Admitting: Family Medicine

## 2013-01-03 ENCOUNTER — Telehealth: Payer: Self-pay | Admitting: Family Medicine

## 2013-01-03 NOTE — Telephone Encounter (Signed)
Message left stating that i was following up to see if she had improved, and hoping that she had. Was seen at ED last week for bronchitis. Sounded as if pt did actually pick up the phone but did not recognize the number

## 2013-01-19 ENCOUNTER — Other Ambulatory Visit: Payer: Self-pay | Admitting: Family Medicine

## 2013-01-22 ENCOUNTER — Ambulatory Visit: Payer: Medicare Other | Admitting: Family Medicine

## 2013-02-18 ENCOUNTER — Telehealth: Payer: Self-pay | Admitting: Family Medicine

## 2013-02-18 DIAGNOSIS — J209 Acute bronchitis, unspecified: Secondary | ICD-10-CM

## 2013-02-18 NOTE — Telephone Encounter (Signed)
Please advise 

## 2013-02-18 NOTE — Telephone Encounter (Signed)
pls document specific symptoms, fever, chills, dyspnea and sputum, if all are posistive esp fever will need a CXR, ifnot pls erx z pack x 1 and tessalon perles 100mg  one 3 times daily # 21 and let her know. Needs ov in 2 weeks time as this is 2nd episode in 5 week period approx, pls sched the appt and let her know She needs to go to Ed if worsens,

## 2013-02-19 ENCOUNTER — Ambulatory Visit (HOSPITAL_COMMUNITY)
Admission: RE | Admit: 2013-02-19 | Discharge: 2013-02-19 | Disposition: A | Discharge status: Home / Self Care | Payer: Medicare Other | Source: Ambulatory Visit | Attending: Family Medicine | Admitting: Family Medicine

## 2013-02-19 DIAGNOSIS — J209 Acute bronchitis, unspecified: Secondary | ICD-10-CM | POA: Diagnosis not present

## 2013-02-19 DIAGNOSIS — R918 Other nonspecific abnormal finding of lung field: Secondary | ICD-10-CM | POA: Diagnosis not present

## 2013-02-19 MED ORDER — BENZONATATE 100 MG PO CAPS: 100.0000 mg | ORAL_CAPSULE | Freq: Three times a day (TID) | ORAL | Status: DC | PRN

## 2013-02-19 MED ORDER — AZITHROMYCIN 250 MG PO TABS: ORAL_TABLET | ORAL | Status: AC

## 2013-02-19 NOTE — Addendum Note (Signed)
Addended by: Kandis Fantasia B on: 02/19/2013 10:42 AM   Modules accepted: Orders

## 2013-02-19 NOTE — Telephone Encounter (Signed)
Spoke with patient who states that she has had chills and is using inhaler for wheezing.  Meds sent to pharmacy along with a order for cxr.  She is aware and will collect.   She will also schedule appointment to come in January.

## 2013-02-19 NOTE — Telephone Encounter (Signed)
pls see response 

## 2013-02-21 ENCOUNTER — Ambulatory Visit (INDEPENDENT_AMBULATORY_CARE_PROVIDER_SITE_OTHER): Payer: Medicare Other

## 2013-02-21 ENCOUNTER — Encounter (INDEPENDENT_AMBULATORY_CARE_PROVIDER_SITE_OTHER): Payer: Self-pay

## 2013-02-21 ENCOUNTER — Telehealth: Payer: Self-pay

## 2013-02-21 VITALS — BP 146/74 | Wt 166.1 lb

## 2013-02-21 DIAGNOSIS — J189 Pneumonia, unspecified organism: Secondary | ICD-10-CM

## 2013-02-21 LAB — CBC WITH DIFFERENTIAL/PLATELET
Basophils Absolute: 0 10*3/uL (ref 0.0–0.1)
Basophils Relative: 0 % (ref 0–1)
Eosinophils Absolute: 0.2 10*3/uL (ref 0.0–0.7)
Eosinophils Relative: 2 % (ref 0–5)
HCT: 41.9 % (ref 36.0–46.0)
Hemoglobin: 13.9 g/dL (ref 12.0–15.0)
Lymphocytes Relative: 43 % (ref 12–46)
Lymphs Abs: 2.8 10*3/uL (ref 0.7–4.0)
MCH: 30.5 pg (ref 26.0–34.0)
MCHC: 33.2 g/dL (ref 30.0–36.0)
MCV: 92.1 fL (ref 78.0–100.0)
Monocytes Absolute: 0.8 10*3/uL (ref 0.1–1.0)
Monocytes Relative: 12 % (ref 3–12)
Neutro Abs: 2.6 10*3/uL (ref 1.7–7.7)
Neutrophils Relative %: 41 % — ABNORMAL LOW (ref 43–77)
Platelets: 299 10*3/uL (ref 150–400)
RBC: 4.55 MIL/uL (ref 3.87–5.11)
RDW: 13.5 % (ref 11.5–15.5)
WBC: 6.5 10*3/uL (ref 4.0–10.5)

## 2013-02-21 MED ORDER — CEFTRIAXONE SODIUM 1 G IJ SOLR
500.0000 mg | Freq: Once | INTRAMUSCULAR | Status: AC
  Administered 2013-02-21: 500 mg via INTRAMUSCULAR

## 2013-02-21 NOTE — Progress Notes (Signed)
Patient in for injection of Rocephin 500mg  for pnuemonia.  Injection given IM in right gluteal.  No sign or symptom of adverse reaction.  No voiced complaints.  Patient sent to have stat blood work.

## 2013-02-21 NOTE — Telephone Encounter (Signed)
Patient will come in at 10am for injection per Dr and to get her stat CBC done. Will order

## 2013-03-11 ENCOUNTER — Ambulatory Visit: Payer: Medicare Other | Admitting: Family Medicine

## 2013-03-12 ENCOUNTER — Encounter: Payer: Self-pay | Admitting: Family Medicine

## 2013-03-12 ENCOUNTER — Ambulatory Visit (INDEPENDENT_AMBULATORY_CARE_PROVIDER_SITE_OTHER): Payer: Medicare Other | Admitting: Family Medicine

## 2013-03-12 ENCOUNTER — Ambulatory Visit (HOSPITAL_COMMUNITY)
Admission: RE | Admit: 2013-03-12 | Discharge: 2013-03-12 | Disposition: A | Discharge status: Home / Self Care | Payer: Medicare Other | Source: Ambulatory Visit | Attending: Family Medicine | Admitting: Family Medicine

## 2013-03-12 VITALS — BP 142/78 | HR 74 | Resp 16 | Ht 64.0 in | Wt 166.0 lb

## 2013-03-12 DIAGNOSIS — J189 Pneumonia, unspecified organism: Secondary | ICD-10-CM

## 2013-03-12 DIAGNOSIS — I10 Essential (primary) hypertension: Secondary | ICD-10-CM | POA: Diagnosis not present

## 2013-03-12 DIAGNOSIS — K7689 Other specified diseases of liver: Secondary | ICD-10-CM | POA: Diagnosis not present

## 2013-03-12 DIAGNOSIS — R911 Solitary pulmonary nodule: Secondary | ICD-10-CM | POA: Insufficient documentation

## 2013-03-12 DIAGNOSIS — R0789 Other chest pain: Secondary | ICD-10-CM

## 2013-03-12 DIAGNOSIS — R079 Chest pain, unspecified: Secondary | ICD-10-CM | POA: Diagnosis not present

## 2013-03-12 DIAGNOSIS — R05 Cough: Secondary | ICD-10-CM | POA: Insufficient documentation

## 2013-03-12 DIAGNOSIS — IMO0001 Reserved for inherently not codable concepts without codable children: Secondary | ICD-10-CM | POA: Diagnosis not present

## 2013-03-12 DIAGNOSIS — R7301 Impaired fasting glucose: Secondary | ICD-10-CM

## 2013-03-12 DIAGNOSIS — R946 Abnormal results of thyroid function studies: Secondary | ICD-10-CM | POA: Diagnosis not present

## 2013-03-12 DIAGNOSIS — R5381 Other malaise: Secondary | ICD-10-CM

## 2013-03-12 DIAGNOSIS — I2510 Atherosclerotic heart disease of native coronary artery without angina pectoris: Secondary | ICD-10-CM | POA: Insufficient documentation

## 2013-03-12 DIAGNOSIS — R059 Cough, unspecified: Secondary | ICD-10-CM | POA: Diagnosis not present

## 2013-03-12 DIAGNOSIS — R5383 Other fatigue: Secondary | ICD-10-CM

## 2013-03-12 DIAGNOSIS — E785 Hyperlipidemia, unspecified: Secondary | ICD-10-CM

## 2013-03-12 DIAGNOSIS — I700 Atherosclerosis of aorta: Secondary | ICD-10-CM | POA: Diagnosis not present

## 2013-03-12 DIAGNOSIS — I359 Nonrheumatic aortic valve disorder, unspecified: Secondary | ICD-10-CM | POA: Insufficient documentation

## 2013-03-12 DIAGNOSIS — R7989 Other specified abnormal findings of blood chemistry: Secondary | ICD-10-CM

## 2013-03-12 LAB — COMPREHENSIVE METABOLIC PANEL
ALT: 29 U/L (ref 0–35)
AST: 25 U/L (ref 0–37)
Albumin: 4.5 g/dL (ref 3.5–5.2)
Alkaline Phosphatase: 43 U/L (ref 39–117)
BUN: 18 mg/dL (ref 6–23)
CO2: 26 mEq/L (ref 19–32)
Calcium: 9.5 mg/dL (ref 8.4–10.5)
Chloride: 103 mEq/L (ref 96–112)
Creat: 0.82 mg/dL (ref 0.50–1.10)
Glucose, Bld: 113 mg/dL — ABNORMAL HIGH (ref 70–99)
Potassium: 4.2 mEq/L (ref 3.5–5.3)
Sodium: 141 mEq/L (ref 135–145)
Total Bilirubin: 0.3 mg/dL (ref 0.3–1.2)
Total Protein: 7.3 g/dL (ref 6.0–8.3)

## 2013-03-12 LAB — LIPID PANEL
Cholesterol: 184 mg/dL (ref 0–200)
HDL: 55 mg/dL (ref >39)
LDL Cholesterol: 106 mg/dL — ABNORMAL HIGH (ref 0–99)
Total CHOL/HDL Ratio: 3.3 Ratio
Triglycerides: 116 mg/dL (ref <150)
VLDL: 23 mg/dL (ref 0–40)

## 2013-03-12 LAB — CBC WITH DIFFERENTIAL/PLATELET
Basophils Absolute: 0 10*3/uL (ref 0.0–0.1)
Basophils Relative: 0 % (ref 0–1)
Eosinophils Absolute: 0.1 10*3/uL (ref 0.0–0.7)
Eosinophils Relative: 1 % (ref 0–5)
HCT: 39.2 % (ref 36.0–46.0)
Hemoglobin: 13.6 g/dL (ref 12.0–15.0)
Lymphocytes Relative: 44 % (ref 12–46)
Lymphs Abs: 3 10*3/uL (ref 0.7–4.0)
MCH: 30.2 pg (ref 26.0–34.0)
MCHC: 34.7 g/dL (ref 30.0–36.0)
MCV: 86.9 fL (ref 78.0–100.0)
Monocytes Absolute: 0.6 10*3/uL (ref 0.1–1.0)
Monocytes Relative: 9 % (ref 3–12)
Neutro Abs: 3.1 10*3/uL (ref 1.7–7.7)
Neutrophils Relative %: 46 % (ref 43–77)
Platelets: 293 10*3/uL (ref 150–400)
RBC: 4.51 MIL/uL (ref 3.87–5.11)
RDW: 14.3 % (ref 11.5–15.5)
WBC: 6.9 10*3/uL (ref 4.0–10.5)

## 2013-03-12 LAB — TSH: TSH: 2.994 u[IU]/mL (ref 0.350–4.500)

## 2013-03-12 LAB — HEMOGLOBIN A1C
Hgb A1c MFr Bld: 6.3 % — ABNORMAL HIGH (ref <5.7)
Mean Plasma Glucose: 134 mg/dL — ABNORMAL HIGH (ref <117)

## 2013-03-12 MED ORDER — HYDROCHLOROTHIAZIDE 25 MG PO TABS: ORAL_TABLET | ORAL | Status: DC

## 2013-03-12 MED ORDER — DULOXETINE HCL 60 MG PO CPEP: ORAL_CAPSULE | ORAL | Status: DC

## 2013-03-12 MED ORDER — OMEPRAZOLE 20 MG PO CPDR: 20.0000 mg | DELAYED_RELEASE_CAPSULE | Freq: Every day | ORAL | Status: DC

## 2013-03-12 MED ORDER — CLINDAMYCIN HCL 300 MG PO CAPS: 300.0000 mg | ORAL_CAPSULE | Freq: Three times a day (TID) | ORAL | Status: AC

## 2013-03-12 MED ORDER — NIACIN ER (ANTIHYPERLIPIDEMIC) 1000 MG PO TBCR: EXTENDED_RELEASE_TABLET | ORAL | Status: DC

## 2013-03-12 NOTE — Progress Notes (Signed)
   Subjective:    Patient ID: Andrea Santiago, female    DOB: 1940-09-17, 73 y.o.   MRN: [id number]  HPI C/o fatigue  Ongoing since Dec 31, she was dx with possible early pneumonia based on CXR, however despite antibiotic course, still a lot of exhaustion, fatigue , right chest discomfort, cough still persists though less, sputum is dark yellow /green, no fever or chills noted in the past week.Just does not feel good   Review of Systems See HPI  Denies chest pains, palpitations and leg swelling Denies abdominal pain, nausea, vomiting,diarrhea or constipation.   Denies dysuria, frequency, hesitancy or incontinence. Chronic joint and fibromyalgia pain, will reduce dose of cymbalta so she can transition to alternative pain med through fibromyalgia Denies headaches, seizures, numbness, or tingling. Denies depression, anxiety or insomnia. Denies skin break down or rash.        Objective:   Physical Exam Patient alert and oriented and in no cardiopulmonary distress.Ill appearing  HEENT: No facial asymmetry, EOMI, maxillary  sinus tenderness,  oropharynx pink and moist.  Neck adequate ROM no adenopathy.  Chest: reduced air entry in right lower lobe  CVS: S1, S2 no murmurs, no S3.  ABD: Soft non tender. Bowel sounds normal.  Ext: No edema  MS: decreased ROM spine, shoulders, hips and knees.  Skin: Intact, no ulcerations or rash noted.  Psych: Good eye contact, normal affect. Memory intact not anxious or depressed appearing.  CNS: CN 2-12 intact, power, tone and sensation normal throughout.        Assessment & Plan:

## 2013-03-12 NOTE — Assessment & Plan Note (Signed)
1 month h/o progressive fatigue with right chest discomfort. Chest scan positive for CAD, needs cardiac eval asap please

## 2013-03-12 NOTE — Assessment & Plan Note (Signed)
Updated lab today Hyperlipidemia:Low fat diet discussed and encouraged.

## 2013-03-12 NOTE — Assessment & Plan Note (Signed)
Chronic fatigue, cough and sputum after 2 courses of antibiotc, chest CT scan today , sputum for c/s, labs and new course of antibioitcs

## 2013-03-12 NOTE — Assessment & Plan Note (Signed)
Controlled, no change in medication DASH diet and commitment to daily physical activity for a minimum of 30 minutes discussed and encouraged, as a part of hypertension management. The importance of attaining a healthy weight is also discussed.  

## 2013-03-12 NOTE — Patient Instructions (Signed)
F/u in 4 weeks, call if you need me before  Chest CT scan to evaluate ongoing symptoms of pneumonia  CBC and diff, cmp, lipid , hBA1C, tSH today  Sputum to be sent in for c/s  Reduce cymbalta to 60mg  ONE at bedtime only, plan is to discontinue so tou can try new medication for fibromyalgia

## 2013-03-12 NOTE — Assessment & Plan Note (Signed)
Abnormal chest scan and c/o right chest pain and fatigue , pt to be recalled for EKG in office today and needs a cardiology appt asap

## 2013-03-13 ENCOUNTER — Encounter: Payer: Self-pay | Admitting: *Deleted

## 2013-03-13 ENCOUNTER — Encounter: Payer: Self-pay | Admitting: Cardiovascular Disease

## 2013-03-13 ENCOUNTER — Ambulatory Visit (INDEPENDENT_AMBULATORY_CARE_PROVIDER_SITE_OTHER): Payer: Medicare Other | Admitting: Cardiovascular Disease

## 2013-03-13 VITALS — BP 143/81 | HR 91 | Ht 64.0 in | Wt 167.0 lb

## 2013-03-13 DIAGNOSIS — R5383 Other fatigue: Secondary | ICD-10-CM

## 2013-03-13 DIAGNOSIS — I2510 Atherosclerotic heart disease of native coronary artery without angina pectoris: Secondary | ICD-10-CM | POA: Diagnosis not present

## 2013-03-13 DIAGNOSIS — R079 Chest pain, unspecified: Secondary | ICD-10-CM

## 2013-03-13 DIAGNOSIS — R0602 Shortness of breath: Secondary | ICD-10-CM | POA: Diagnosis not present

## 2013-03-13 DIAGNOSIS — I359 Nonrheumatic aortic valve disorder, unspecified: Secondary | ICD-10-CM

## 2013-03-13 DIAGNOSIS — R5381 Other malaise: Secondary | ICD-10-CM

## 2013-03-13 NOTE — Patient Instructions (Signed)
Your physician has requested that you have an echocardiogram. Echocardiography is a painless test that uses sound waves to create images of your heart. It provides your doctor with information about the size and shape of your heart and how well your heart's chambers and valves are working. This procedure takes approximately one hour. There are no restrictions for this procedure. Your physician has requested that you have en exercise stress myoview. For further information please visit [email or web address]. Please follow instruction sheet, as given. Continue all current medications. Follow up in  2 weeks
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
## 2013-03-19 ENCOUNTER — Other Ambulatory Visit: Payer: Self-pay

## 2013-03-19 ENCOUNTER — Other Ambulatory Visit (INDEPENDENT_AMBULATORY_CARE_PROVIDER_SITE_OTHER): Payer: Medicare Other

## 2013-03-19 DIAGNOSIS — I359 Nonrheumatic aortic valve disorder, unspecified: Secondary | ICD-10-CM | POA: Diagnosis not present

## 2013-03-19 DIAGNOSIS — R079 Chest pain, unspecified: Secondary | ICD-10-CM | POA: Diagnosis not present

## 2013-03-19 DIAGNOSIS — I2510 Atherosclerotic heart disease of native coronary artery without angina pectoris: Secondary | ICD-10-CM | POA: Diagnosis not present

## 2013-03-20 ENCOUNTER — Encounter (HOSPITAL_COMMUNITY): Payer: Self-pay

## 2013-03-20 ENCOUNTER — Encounter (HOSPITAL_COMMUNITY)
Admission: RE | Admit: 2013-03-20 | Discharge: 2013-03-20 | Disposition: A | Discharge status: Home / Self Care | Payer: Medicare Other | Source: Ambulatory Visit | Attending: Cardiovascular Disease | Admitting: Cardiovascular Disease

## 2013-03-20 DIAGNOSIS — I2510 Atherosclerotic heart disease of native coronary artery without angina pectoris: Secondary | ICD-10-CM

## 2013-03-20 DIAGNOSIS — R079 Chest pain, unspecified: Secondary | ICD-10-CM | POA: Diagnosis not present

## 2013-03-20 DIAGNOSIS — I359 Nonrheumatic aortic valve disorder, unspecified: Secondary | ICD-10-CM | POA: Diagnosis not present

## 2013-03-20 MED ORDER — REGADENOSON 0.4 MG/5ML IV SOLN
INTRAVENOUS | Status: AC
  Filled 2013-03-20: qty 5

## 2013-03-20 MED ORDER — SODIUM CHLORIDE 0.9 % IJ SOLN
INTRAMUSCULAR | Status: AC
  Administered 2013-03-20: 10 mL via INTRAVENOUS
  Filled 2013-03-20: qty 10

## 2013-03-20 MED ORDER — TECHNETIUM TC 99M SESTAMIBI - CARDIOLITE
30.0000 | Freq: Once | INTRAVENOUS | Status: AC | PRN
  Administered 2013-03-20: 30 via INTRAVENOUS

## 2013-03-20 MED ORDER — TECHNETIUM TC 99M SESTAMIBI GENERIC - CARDIOLITE
10.0000 | Freq: Once | INTRAVENOUS | Status: AC | PRN
  Administered 2013-03-20: 10 via INTRAVENOUS

## 2013-03-20 NOTE — Progress Notes (Signed)
Stress Lab Nurses Notes - [facility]  SANYIA DINI 03/20/2013 Reason for doing test: Chest Pain, Dyspnea and CAD Type of test: Stress Cardiolite Nurse performing test: Gerrit Halls, RN Nuclear Medicine Tech: Dyanne Carrel Echo Tech: Not Applicable MD performing test: Branch/K.Purcell Nails NP Family MD: Dr. Moshe Cipro Test explained and consent signed: yes IV started: 22g jelco, Saline lock flushed, No redness or edema and Saline lock started in radiology Symptoms: SOB Treatment/Intervention: None Reason test stopped: fatigue After recovery IV was: Discontinued via X-ray tech and No redness or edema Patient to return to [facility]. Med at : 11:00 Patient discharged: Home Patient's Condition upon discharge was: stable Comments: During stress test peak BP 158/71 & HR 155.  Recovery BP 147/58 & HR 98.  Symptoms resolved in recovery. Geanie Cooley T

## 2013-03-25 ENCOUNTER — Encounter: Payer: Self-pay | Admitting: *Deleted

## 2013-03-27 ENCOUNTER — Encounter (INDEPENDENT_AMBULATORY_CARE_PROVIDER_SITE_OTHER): Payer: Self-pay

## 2013-03-27 ENCOUNTER — Ambulatory Visit (INDEPENDENT_AMBULATORY_CARE_PROVIDER_SITE_OTHER): Payer: Medicare Other | Admitting: Family Medicine

## 2013-03-27 ENCOUNTER — Encounter: Payer: Self-pay | Admitting: Family Medicine

## 2013-03-27 VITALS — BP 144/74 | HR 78 | Resp 16 | Ht 64.0 in | Wt 171.1 lb

## 2013-03-27 DIAGNOSIS — E785 Hyperlipidemia, unspecified: Secondary | ICD-10-CM | POA: Diagnosis not present

## 2013-03-27 DIAGNOSIS — Z01419 Encounter for gynecological examination (general) (routine) without abnormal findings: Secondary | ICD-10-CM

## 2013-03-27 DIAGNOSIS — Z1211 Encounter for screening for malignant neoplasm of colon: Secondary | ICD-10-CM | POA: Diagnosis not present

## 2013-03-27 DIAGNOSIS — Z0000 Encounter for general adult medical examination without abnormal findings: Secondary | ICD-10-CM

## 2013-03-27 DIAGNOSIS — N644 Mastodynia: Secondary | ICD-10-CM | POA: Diagnosis not present

## 2013-03-27 DIAGNOSIS — I10 Essential (primary) hypertension: Secondary | ICD-10-CM

## 2013-03-27 DIAGNOSIS — R7301 Impaired fasting glucose: Secondary | ICD-10-CM

## 2013-03-27 DIAGNOSIS — R911 Solitary pulmonary nodule: Secondary | ICD-10-CM

## 2013-03-27 LAB — HEMOCCULT GUIAC POC 1CARD (OFFICE): Fecal Occult Blood, POC: NEGATIVE

## 2013-03-27 NOTE — Patient Instructions (Addendum)
F/u in 4.5 month, call if you need me before  Fasting lipid, cmp , hBa1C in 4.5 month  You will be referred to breast surgeon for evaluation and management of right breast pain in area of  Surgical scar  You will have a chest CT scan next year to f/u the lung nodule  Please keep f/u with cardiology

## 2013-03-28 ENCOUNTER — Encounter: Payer: Self-pay | Admitting: Family Medicine

## 2013-03-28 DIAGNOSIS — R911 Solitary pulmonary nodule: Secondary | ICD-10-CM | POA: Insufficient documentation

## 2013-03-28 DIAGNOSIS — N644 Mastodynia: Secondary | ICD-10-CM | POA: Insufficient documentation

## 2013-03-28 NOTE — Assessment & Plan Note (Signed)
Deteriorated Patient educated about the importance of limiting  Carbohydrate intake , the need to commit to daily physical activity for a minimum of 30 minutes , and to commit weight loss. The fact that changes in all these areas will reduce or eliminate all together the development of diabetes is stressed.    

## 2013-03-28 NOTE — Assessment & Plan Note (Signed)
Right breast pain in area of surgical scar. Pt requests surgical eval for removal of scar tissue , states the area is painful

## 2013-03-28 NOTE — Assessment & Plan Note (Signed)
Annual exam as documented. Good health habits reviewed , and re commitment to change for improved health is made. Will rept chest scan in 12 month, f/u lung nodule Refer to breast surgeon re breast pain in surgical scar.

## 2013-03-28 NOTE — Assessment & Plan Note (Signed)
LDL increased Hyperlipidemia:Low fat diet discussed and encouraged.  No med change at this time

## 2013-03-28 NOTE — Progress Notes (Signed)
Subjective:    Patient ID: Andrea Santiago, female    DOB: Dec 30, 1940, 73 y.o.   MRN: [id number]  HPI Patient is in for annual exam Health maintainance is reviewed and updated, specifically screening tests and recommended immunizations. Recent lab and radiologic data, since previous visit is also reviewed with the patient. Healthy lifestyle as far as commitment to regular physical activity, heart healthy diet , safe habits, as far as seat belt and helmet use,safe and responsible sex involving condom and contraceptive use , also safe storage of firearms is discussed. The importance of adequate rest is also discussed. Ms Deskins has concerns regarding recent cardiology eval and on review of record, cardiologist intends to discuss at [facility]. Chest scan shows a lung nodule, though not exposed to a lot of nicotine and essentially a non smoker , she has a brother with lung cancer , so this is anxiety provoking, rept chest scan in 1 year. C/o pain in right breast in area of surgical scar, specifically in lateral aspect, wants surgical intervention       Review of Systems See HPI Denies recent fever or chills. Denies sinus pressure, nasal congestion, ear pain or sore throat. Denies chest congestion, productive cough or wheezing. Denies chest pains, palpitations and leg swelling Denies abdominal pain, nausea, vomiting,diarrhea or constipation.   Denies dysuria, frequency, hesitancy or incontinence. Chronic joint pain, and limitation in mobility. Denies headaches, seizures, numbness, or tingling. Denies uncontrolled  depression, anxiety or insomnia. Denies skin break down or rash.        Objective:   Physical Exam  BP 144/74  Pulse 78  Resp 16  Ht 5\' 4"  (1.626 m)  Wt 171 lb 1.9 oz (77.62 kg)  BMI 29.36 kg/m2  SpO2 98%  Pleasant well nourished female, alert and oriented x 3, in no cardio-pulmonary distress. Afebrile. HEENT No facial trauma or asymetry. Sinuses non tender.  EOMI,  PERTL,   External ears normal, tympanic membranes clear. Oropharynx moist, no exudate, fair dentition. Neck: decreased though adequate ROM, no adenopathy,JVD or thyromegaly.No bruits.  Chest: Clear to ascultation bilaterally.No crackles or wheezes. Non tender to palpation  Breast: Bilateral mastectomy for fibrocystic breast disease, scar on right breast where there was a procedure in 2010 to remove scar tissue, unsuccessful per pt report Tender in lower outer quadrant of right  breast no masses. No nipple discharge or inversion. No axillary or supraclavicular adenopathy  Cardiovascular system; Heart sounds normal,  S1 and  S2 ,no S3.  No murmur, or thrill. Apical beat not displaced Peripheral pulses normal.  Abdomen: Soft, non tender, no organomegaly or masses. No bruits. Bowel sounds normal. No guarding, tenderness or rebound.  Rectal:  No mass. Guaiac negative stool.  GU: External genitalia normal. No lesions. Introitus appears normal patient reports excessive discomfort with previous exam over 7 year ago, and is extremely tense and fearful. She has had a hysterectomy. Visible vaginal mucosa appears adequately lubricated and pink. No inguinal adenopathy  Musculoskeletal exam: Decreased  ROM of spine, hips , shoulders and knees. No deformity ,swelling or crepitus noted. No muscle wasting or atrophy.   Neurologic: Cranial nerves 2 to 12 intact. Power, tone ,sensation and reflexes normal throughout. . No tremor.  Skin: Intact, no ulceration, erythema , scaling or rash noted. Pigmentation normal throughout  Psych; Normal mood and affect. Judgement and concentration normal       Assessment & Plan:  Routine general medical examination at a health care facility Annual exam as documented.  Good health habits reviewed , and re commitment to change for improved health is made. Will rept chest scan in 12 month, f/u lung nodule Refer to breast surgeon re breast pain in  surgical scar.   HYPERLIPIDEMIA LDL increased Hyperlipidemia:Low fat diet discussed and encouraged.  No med change at this time  Impaired fasting glucose Deteriorated. Patient educated about the importance of limiting  Carbohydrate intake , the need to commit to daily physical activity for a minimum of 30 minutes , and to commit weight loss. The fact that changes in all these areas will reduce or eliminate all together the development of diabetes is stressed.     Nodule of left lung Rept scan in 1 year, brother has lung ca, pt is a non smoker  Mastalgia in female Right breast pain in area of surgical scar. Pt requests surgical eval for removal of scar tissue , states the area is painful  HYPERTENSION Sub optimal but adequate control. With weight loss and increased activity systolic pressure will correct

## 2013-03-28 NOTE — Assessment & Plan Note (Signed)
Rept scan in 1 year, brother has lung ca, pt is a non smoker

## 2013-03-28 NOTE — Assessment & Plan Note (Signed)
Sub optimal but adequate control. With weight loss and increased activity systolic pressure will correct

## 2013-03-31 ENCOUNTER — Telehealth: Payer: Self-pay | Admitting: Family Medicine

## 2013-03-31 NOTE — Telephone Encounter (Signed)
pls explain to pt , she will need to wait till July when mammo is due, unless she feels the breast pain is more severe, she has had this for a long time, and thenIi will order diagnostic  Mammo, pls let me know her response

## 2013-03-31 NOTE — Telephone Encounter (Signed)
Andrea Santiago stated that if she has the [place] that will do please put in

## 2013-04-01 ENCOUNTER — Telehealth: Payer: Self-pay | Admitting: Family Medicine

## 2013-04-01 ENCOUNTER — Other Ambulatory Visit: Payer: Self-pay | Admitting: Family Medicine

## 2013-04-01 DIAGNOSIS — N644 Mastodynia: Secondary | ICD-10-CM

## 2013-04-01 NOTE — Telephone Encounter (Signed)
Entered, pls schedule, thanks

## 2013-04-07 ENCOUNTER — Ambulatory Visit: Payer: Medicare Other | Admitting: Cardiovascular Disease

## 2013-04-07 ENCOUNTER — Ambulatory Visit (INDEPENDENT_AMBULATORY_CARE_PROVIDER_SITE_OTHER): Payer: Medicare Other | Admitting: Cardiovascular Disease

## 2013-04-07 VITALS — BP 134/84 | HR 92 | Ht 64.0 in | Wt 168.0 lb

## 2013-04-07 DIAGNOSIS — I359 Nonrheumatic aortic valve disorder, unspecified: Secondary | ICD-10-CM

## 2013-04-07 DIAGNOSIS — R079 Chest pain, unspecified: Secondary | ICD-10-CM

## 2013-04-07 DIAGNOSIS — Z77098 Contact with and (suspected) exposure to other hazardous, chiefly nonmedicinal, chemicals: Secondary | ICD-10-CM | POA: Diagnosis not present

## 2013-04-07 DIAGNOSIS — R5381 Other malaise: Secondary | ICD-10-CM | POA: Diagnosis not present

## 2013-04-07 DIAGNOSIS — R0609 Other forms of dyspnea: Secondary | ICD-10-CM

## 2013-04-07 DIAGNOSIS — R5383 Other fatigue: Secondary | ICD-10-CM

## 2013-04-07 DIAGNOSIS — R0989 Other specified symptoms and signs involving the circulatory and respiratory systems: Secondary | ICD-10-CM | POA: Diagnosis not present

## 2013-04-07 DIAGNOSIS — I2510 Atherosclerotic heart disease of native coronary artery without angina pectoris: Secondary | ICD-10-CM | POA: Diagnosis not present

## 2013-04-07 DIAGNOSIS — R0600 Dyspnea, unspecified: Secondary | ICD-10-CM

## 2013-04-07 NOTE — Patient Instructions (Addendum)
Your physician recommends that you schedule a follow-up appointment in: 6 months     Your physician has recommended that you have a pulmonary function test. Pulmonary Function Tests are a group of tests that measure how well air moves in and out of your lungs.   Your physician recommends that you continue on your current medications as directed. Please refer to the Current Medication list given to you today.       Thanks for choosing [facility] !
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
## 2013-04-09 ENCOUNTER — Ambulatory Visit (HOSPITAL_COMMUNITY)
Admission: RE | Admit: 2013-04-09 | Discharge: 2013-04-09 | Disposition: A | Discharge status: Home / Self Care | Payer: Medicare Other | Source: Ambulatory Visit | Attending: Family Medicine | Admitting: Family Medicine

## 2013-04-09 DIAGNOSIS — N644 Mastodynia: Secondary | ICD-10-CM

## 2013-04-09 DIAGNOSIS — R079 Chest pain, unspecified: Secondary | ICD-10-CM | POA: Diagnosis not present

## 2013-04-09 DIAGNOSIS — Z9010 Acquired absence of unspecified breast and nipple: Secondary | ICD-10-CM | POA: Insufficient documentation

## 2013-04-09 DIAGNOSIS — R928 Other abnormal and inconclusive findings on diagnostic imaging of breast: Secondary | ICD-10-CM | POA: Diagnosis not present

## 2013-04-21 ENCOUNTER — Ambulatory Visit (INDEPENDENT_AMBULATORY_CARE_PROVIDER_SITE_OTHER): Payer: Medicare Other | Admitting: Surgery

## 2013-04-21 ENCOUNTER — Encounter (INDEPENDENT_AMBULATORY_CARE_PROVIDER_SITE_OTHER): Payer: Self-pay | Admitting: Surgery

## 2013-04-21 VITALS — BP 128/64 | HR 80 | Resp 14 | Ht 64.0 in | Wt 170.2 lb

## 2013-04-21 DIAGNOSIS — N644 Mastodynia: Secondary | ICD-10-CM | POA: Diagnosis not present
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
## 2013-04-21 NOTE — Patient Instructions (Signed)
PAIN FROM SCAR TISSUE.  NOTHING ELSE TO OFFER. NO SIGN OF CANCER.

## 2013-04-28 ENCOUNTER — Ambulatory Visit (HOSPITAL_COMMUNITY): Payer: Medicare Other

## 2013-04-28 ENCOUNTER — Ambulatory Visit (HOSPITAL_COMMUNITY): Admission: RE | Admit: 2013-04-28 | Payer: Medicare Other | Source: Ambulatory Visit

## 2013-05-13 ENCOUNTER — Other Ambulatory Visit: Payer: Self-pay

## 2013-05-13 ENCOUNTER — Telehealth: Payer: Self-pay

## 2013-05-13 ENCOUNTER — Ambulatory Visit (HOSPITAL_COMMUNITY)
Admission: RE | Admit: 2013-05-13 | Discharge: 2013-05-13 | Disposition: A | Discharge status: Home / Self Care | Payer: Medicare Other | Source: Ambulatory Visit | Attending: Cardiovascular Disease | Admitting: Cardiovascular Disease

## 2013-05-13 DIAGNOSIS — Z77098 Contact with and (suspected) exposure to other hazardous, chiefly nonmedicinal, chemicals: Secondary | ICD-10-CM

## 2013-05-13 DIAGNOSIS — R0989 Other specified symptoms and signs involving the circulatory and respiratory systems: Principal | ICD-10-CM | POA: Insufficient documentation

## 2013-05-13 DIAGNOSIS — Z87891 Personal history of nicotine dependence: Secondary | ICD-10-CM | POA: Diagnosis not present

## 2013-05-13 DIAGNOSIS — R5383 Other fatigue: Secondary | ICD-10-CM

## 2013-05-13 DIAGNOSIS — R0609 Other forms of dyspnea: Secondary | ICD-10-CM | POA: Insufficient documentation

## 2013-05-13 DIAGNOSIS — R0600 Dyspnea, unspecified: Secondary | ICD-10-CM

## 2013-05-13 DIAGNOSIS — Z79899 Other long term (current) drug therapy: Secondary | ICD-10-CM | POA: Diagnosis not present

## 2013-05-13 DIAGNOSIS — R0602 Shortness of breath: Secondary | ICD-10-CM

## 2013-05-13 LAB — PULMONARY FUNCTION TEST
DL/VA % pred: 96 %
DL/VA: 4.62 ml/min/mmHg/L
DLCO cor % pred: 53 %
DLCO cor: 12.99 ml/min/mmHg
DLCO unc % pred: 53 %
DLCO unc: 12.99 ml/min/mmHg
FEF 25-75 Post: 1.61 L/sec
FEF 25-75 Pre: 2.32 L/sec
FEF2575-%Change-Post: -30 %
FEF2575-%Pred-Post: 96 %
FEF2575-%Pred-Pre: 138 %
FEV1-%Change-Post: -7 %
FEV1-%Pred-Post: 87 %
FEV1-%Pred-Pre: 94 %
FEV1-Post: 1.86 L
FEV1-Pre: 2.01 L
FEV1FVC-%Change-Post: 6 %
FEV1FVC-%Pred-Pre: 110 %
FEV6-%Change-Post: -13 %
FEV6-%Pred-Post: 78 %
FEV6-%Pred-Pre: 90 %
FEV6-Post: 2.09 L
FEV6-Pre: 2.42 L
FEV6FVC-%Pred-Post: 105 %
FEV6FVC-%Pred-Pre: 105 %
FVC-%Change-Post: -13 %
FVC-%Pred-Post: 74 %
FVC-%Pred-Pre: 85 %
FVC-Post: 2.09 L
FVC-Pre: 2.42 L
Post FEV1/FVC ratio: 89 %
Post FEV6/FVC ratio: 100 %
Pre FEV1/FVC ratio: 83 %
Pre FEV6/FVC Ratio: 100 %
RV % pred: 73 %
RV: 1.68 L
TLC % pred: 78 %
TLC: 3.98 L

## 2013-05-13 MED ORDER — ALBUTEROL SULFATE (2.5 MG/3ML) 0.083% IN NEBU
2.5000 mg | INHALATION_SOLUTION | Freq: Once | RESPIRATORY_TRACT | Status: AC
  Administered 2013-05-13: 2.5 mg via RESPIRATORY_TRACT

## 2013-05-13 NOTE — Telephone Encounter (Signed)
Message copied by Bernita Raisin on Tue May 13, 2013  4:28 PM ------      Message from: Kate Sable A      Created: Tue May 13, 2013  2:55 PM       Please make a pulmonary referral. ------

## 2013-05-13 NOTE — Telephone Encounter (Signed)
Ref completed for pulmonary

## 2013-05-15 ENCOUNTER — Other Ambulatory Visit: Payer: Self-pay | Admitting: Family Medicine

## 2013-05-24 ENCOUNTER — Other Ambulatory Visit: Payer: Self-pay | Admitting: Family Medicine

## 2013-05-28 ENCOUNTER — Telehealth: Payer: Self-pay | Admitting: Family Medicine

## 2013-05-28 MED ORDER — LEVOFLOXACIN 500 MG PO TABS: 500.0000 mg | ORAL_TABLET | Freq: Every day | ORAL | Status: DC

## 2013-05-28 MED ORDER — BENZONATATE 100 MG PO CAPS: 100.0000 mg | ORAL_CAPSULE | Freq: Three times a day (TID) | ORAL | Status: DC | PRN

## 2013-05-28 NOTE — Telephone Encounter (Signed)
Ms Andrea Santiago made aware.  She will collect med for patient

## 2013-05-28 NOTE — Addendum Note (Signed)
Addended by: Denman George B on: 05/28/2013 04:55 PM   Modules accepted: Orders

## 2013-05-28 NOTE — Telephone Encounter (Signed)
Andrea Santiago called in for patient (who is on the way back from [place]). She has bronchitis and sounds like a frog. Has been going on since Monday. Coughing up green phlegm and needs something called into the pharmacy. Tessalon pearls don't work for her. Please advise

## 2013-05-28 NOTE — Telephone Encounter (Signed)
Returned call back

## 2013-05-28 NOTE — Telephone Encounter (Signed)
pls send in levaquin 500 mg one daily # 7 and also tessalon perles 100mg  one three times dailu and let her know

## 2013-05-28 NOTE — Telephone Encounter (Signed)
Called patient and left message for them to return call at the office   

## 2013-06-02 ENCOUNTER — Other Ambulatory Visit: Payer: Self-pay | Admitting: Family Medicine

## 2013-06-10 ENCOUNTER — Telehealth: Payer: Self-pay | Admitting: Family Medicine

## 2013-06-10 DIAGNOSIS — M25512 Pain in left shoulder: Secondary | ICD-10-CM

## 2013-06-10 NOTE — Telephone Encounter (Signed)
Patient states that she is having pain in left shoulder after working in yard yesterday.  Would like xray ordered.  Order entered.

## 2013-06-10 NOTE — Telephone Encounter (Signed)
noted 

## 2013-06-13 ENCOUNTER — Other Ambulatory Visit: Payer: Self-pay | Admitting: Family Medicine

## 2013-06-21 ENCOUNTER — Other Ambulatory Visit: Payer: Self-pay | Admitting: Family Medicine

## 2013-06-26 ENCOUNTER — Ambulatory Visit (HOSPITAL_COMMUNITY)
Admission: RE | Admit: 2013-06-26 | Discharge: 2013-06-26 | Disposition: A | Discharge status: Home / Self Care | Payer: Medicare Other | Source: Ambulatory Visit | Attending: Family Medicine | Admitting: Family Medicine

## 2013-06-26 ENCOUNTER — Telehealth: Payer: Self-pay | Admitting: Cardiovascular Disease

## 2013-06-26 DIAGNOSIS — M19019 Primary osteoarthritis, unspecified shoulder: Secondary | ICD-10-CM | POA: Diagnosis not present

## 2013-06-26 DIAGNOSIS — M25512 Pain in left shoulder: Secondary | ICD-10-CM

## 2013-06-26 DIAGNOSIS — M25519 Pain in unspecified shoulder: Secondary | ICD-10-CM | POA: Insufficient documentation

## 2013-06-26 NOTE — Telephone Encounter (Signed)
Patient calling for results of PFT'S done in March Referral was placed in March for Dr.Hawkins.I called and spoke with staff,they were never able to reach pt I gave her pts phone number to call her now and schedule consultation I called pt and she will call dr.hawkins office now

## 2013-06-26 NOTE — Telephone Encounter (Signed)
Please call with results of PFT's done on 3/24/tgs

## 2013-06-30 ENCOUNTER — Other Ambulatory Visit: Payer: Self-pay | Admitting: Family Medicine

## 2013-07-08 ENCOUNTER — Telehealth: Payer: Self-pay | Admitting: Family Medicine

## 2013-07-08 NOTE — Telephone Encounter (Signed)
Apt scheduled tomorrow.

## 2013-07-09 ENCOUNTER — Ambulatory Visit (INDEPENDENT_AMBULATORY_CARE_PROVIDER_SITE_OTHER): Payer: Medicare Other | Admitting: Family Medicine

## 2013-07-09 ENCOUNTER — Encounter (INDEPENDENT_AMBULATORY_CARE_PROVIDER_SITE_OTHER): Payer: Self-pay

## 2013-07-09 ENCOUNTER — Encounter: Payer: Self-pay | Admitting: Family Medicine

## 2013-07-09 VITALS — BP 130/74 | HR 81 | Resp 16 | Ht 64.0 in | Wt 171.1 lb

## 2013-07-09 DIAGNOSIS — I10 Essential (primary) hypertension: Secondary | ICD-10-CM

## 2013-07-09 DIAGNOSIS — E785 Hyperlipidemia, unspecified: Secondary | ICD-10-CM | POA: Diagnosis not present

## 2013-07-09 DIAGNOSIS — F329 Major depressive disorder, single episode, unspecified: Secondary | ICD-10-CM

## 2013-07-09 DIAGNOSIS — M25512 Pain in left shoulder: Principal | ICD-10-CM

## 2013-07-09 DIAGNOSIS — M25519 Pain in unspecified shoulder: Secondary | ICD-10-CM

## 2013-07-09 DIAGNOSIS — I2510 Atherosclerotic heart disease of native coronary artery without angina pectoris: Secondary | ICD-10-CM | POA: Diagnosis not present

## 2013-07-09 DIAGNOSIS — IMO0001 Reserved for inherently not codable concepts without codable children: Secondary | ICD-10-CM

## 2013-07-09 DIAGNOSIS — F3289 Other specified depressive episodes: Secondary | ICD-10-CM

## 2013-07-09 DIAGNOSIS — R7301 Impaired fasting glucose: Secondary | ICD-10-CM | POA: Diagnosis not present

## 2013-07-09 DIAGNOSIS — M25511 Pain in right shoulder: Secondary | ICD-10-CM

## 2013-07-09 MED ORDER — METHYLPREDNISOLONE ACETATE 80 MG/ML IJ SUSP
80.0000 mg | Freq: Once | INTRAMUSCULAR | Status: AC
  Administered 2013-07-09: 80 mg via INTRAMUSCULAR

## 2013-07-09 MED ORDER — KETOROLAC TROMETHAMINE 60 MG/2ML IM SOLN
60.0000 mg | Freq: Once | INTRAMUSCULAR | Status: AC
  Administered 2013-07-09: 60 mg via INTRAMUSCULAR

## 2013-07-09 MED ORDER — PREDNISONE 5 MG PO TABS: 5.0000 mg | ORAL_TABLET | Freq: Two times a day (BID) | ORAL | Status: DC

## 2013-07-09 NOTE — Patient Instructions (Addendum)
Annual wellness in 4 month, call if you need me before   Toradol and depo medrol in office for neck and upper arm pain  Prednisone sent in for 5 days     Cymbalta  Take one every other day for 3 weeks, then one twice weekly for 3 weeks , then stop You will be referred to [facility] for July appt  Fasting lipid, cmp and hBa1C end July

## 2013-07-30 ENCOUNTER — Other Ambulatory Visit: Payer: Self-pay | Admitting: Family Medicine

## 2013-08-04 ENCOUNTER — Telehealth: Payer: Self-pay | Admitting: Family Medicine

## 2013-08-04 NOTE — Telephone Encounter (Signed)
Referral entered for Dr. Estanislado Pandy to be seen in July.  Referral in process.

## 2013-08-13 NOTE — Assessment & Plan Note (Signed)
Patient educated about the importance of limiting  Carbohydrate intake , the need to commit to daily physical activity for a minimum of 30 minutes , and to commit weight loss. The fact that changes in all these areas will reduce or eliminate all together the development of diabetes is stressed.   Updated lab needed at/ before next visit.  

## 2013-08-13 NOTE — Assessment & Plan Note (Signed)
Controlled, no change in medication  

## 2013-08-13 NOTE — Assessment & Plan Note (Signed)
Controlled, no change in medication DASH diet and commitment to daily physical activity for a minimum of 30 minutes discussed and encouraged, as a part of hypertension management. The importance of attaining a healthy weight is also discussed.  

## 2013-08-13 NOTE — Assessment & Plan Note (Signed)
Uncontrolled , anti inflammatories IM and orally

## 2013-08-13 NOTE — Assessment & Plan Note (Signed)
Uncontrolled pain, wishes to d/c cymbalta and try alternative med through rheumatology. Sh e is to taper off cymbalta and an appt with rheumatology is requested for July

## 2013-08-13 NOTE — Progress Notes (Signed)
   Subjective:    Patient ID: Andrea Santiago, female    DOB: Sep 12, 1940, 73 y.o.   MRN: [id number]  HPI The PT is here with main c/o 2 month h/o bilateral shoulder pain , involving the neck with decreased ability to move the shouldrs.  follow up and re-evaluation of chronic medical conditions, medication management and review of any available recent lab and radiology data is also done  Preventive health is updated, specifically  Cancer screening and Immunization.   Questions or concerns regarding consultations or procedures which the PT has had in the interim are  Addressed.Had surgical consult re right breast and wants to return to rheumatology re fibromyalgia The PT denies any adverse reactions to current medications since the last visit. Wants to change from cymbalta to different drug for fibromyalgia     Review of Systems See HPI Denies recent fever or chills. Denies sinus pressure, nasal congestion, ear pain or sore throat. Denies chest congestion, productive cough or wheezing. Denies chest pains, palpitations and leg swelling Denies abdominal pain, nausea, vomiting,diarrhea or constipation.   Denies dysuria, frequency, hesitancy or incontinence.  Denies headaches, seizures, numbness, or tingling. Denies depression, anxiety or insomnia. Denies skin break down or rash.        Objective:   Physical Exam  BP 130/74  Pulse 81  Resp 16  Ht 5\' 4"  (1.626 m)  Wt 171 lb 1.9 oz (77.62 kg)  BMI 29.36 kg/m2  SpO2 97% Patient alert and oriented and in no cardiopulmonary distress.Pt in pain  HEENT: No facial asymmetry, EOMI,   oropharynx pink and moist.  Neck decreased ROM no JVD, no mass.  Chest: Clear to auscultation bilaterally.  CVS: S1, S2 no murmurs, no S3.  ABD: Soft non tender.   Ext: No edema  MS: Adequate though reduced  ROM spine, hips and knees.multiple tender spots. Markedly reduced ROM both shoulders  Skin: Intact, no ulcerations or rash noted.  Psych:  Good eye contact, normal affect. Memory intact not anxious or depressed appearing.  CNS: CN 2-12 intact, power,  normal throughout.no focal deficits noted.       Assessment & Plan:  Shoulder pain, bilateral Uncontrolled , anti inflammatories IM and orally  FIBROMYALGIA Uncontrolled pain, wishes to d/c cymbalta and try alternative med through rheumatology. Sh e is to taper off cymbalta and an appt with rheumatology is requested for July  Impaired fasting glucose Patient educated about the importance of limiting  Carbohydrate intake , the need to commit to daily physical activity for a minimum of 30 minutes , and to commit weight loss. The fact that changes in all these areas will reduce or eliminate all together the development of diabetes is stressed.   Updated lab needed at/ before next visit.   HYPERTENSION Controlled, no change in medication DASH diet and commitment to daily physical activity for a minimum of 30 minutes discussed and encouraged, as a part of hypertension management. The importance of attaining a healthy weight is also discussed.   DEPRESSION Controlled, no change in medication   HYPERLIPIDEMIA uncontrollled  Hyperlipidemia:Low fat diet discussed and encouraged.  Updated lab needed at/ before next visit.

## 2013-08-13 NOTE — Assessment & Plan Note (Signed)
uncontrollled  Hyperlipidemia:Low fat diet discussed and encouraged.  Updated lab needed at/ before next visit.

## 2013-08-27 ENCOUNTER — Ambulatory Visit: Payer: Medicare Other | Admitting: Family Medicine

## 2013-09-05 ENCOUNTER — Telehealth: Payer: Self-pay

## 2013-09-05 MED ORDER — ONDANSETRON HCL 4 MG PO TABS: 4.0000 mg | ORAL_TABLET | Freq: Every day | ORAL | Status: DC | PRN

## 2013-09-05 MED ORDER — DIPHENOXYLATE-ATROPINE 2.5-0.025 MG PO TABS: 1.0000 | ORAL_TABLET | Freq: Four times a day (QID) | ORAL | Status: DC | PRN

## 2013-09-05 NOTE — Telephone Encounter (Signed)
Vomiting Yes.      Recommended treatment Hydration is important Fluids small frequent amounts as tolerated Good hygiene reduces transmission among family members Review Brat diet  Zofran 4 mg 1 tablet daily as needed for nausea and vomiting no more than 6 tablets   DiarrheaYes.    Recommended treatment  Imodium OTC  Can also offer Lomotil 1 tablet 4 times daily as needed no more than 10 tablets Good hygiene reduces transmission among family members Review Brat Diet  If patient starts to feel light headed or not passing much urine or becoming dehydrated will need to go to emergency room for IV hydration  Please call office if symptoms worsen or do not improve after 2-3 days   Patient aware of advice.  States that she has had symptoms x 1 week.  Is drinking plenty of fluids and understands the symptoms of dehydration.

## 2013-09-23 DIAGNOSIS — R7301 Impaired fasting glucose: Secondary | ICD-10-CM | POA: Diagnosis not present

## 2013-09-23 DIAGNOSIS — E785 Hyperlipidemia, unspecified: Secondary | ICD-10-CM | POA: Diagnosis not present

## 2013-09-23 DIAGNOSIS — I10 Essential (primary) hypertension: Secondary | ICD-10-CM | POA: Diagnosis not present

## 2013-09-23 LAB — LIPID PANEL
Cholesterol: 160 mg/dL (ref 0–200)
HDL: 53 mg/dL (ref >39)
LDL Cholesterol: 87 mg/dL (ref 0–99)
Total CHOL/HDL Ratio: 3 Ratio
Triglycerides: 98 mg/dL (ref <150)
VLDL: 20 mg/dL (ref 0–40)

## 2013-09-23 LAB — COMPREHENSIVE METABOLIC PANEL
ALT: 22 U/L (ref 0–35)
AST: 26 U/L (ref 0–37)
Albumin: 3.9 g/dL (ref 3.5–5.2)
Alkaline Phosphatase: 44 U/L (ref 39–117)
BUN: 22 mg/dL (ref 6–23)
CO2: 25 mEq/L (ref 19–32)
Calcium: 9.6 mg/dL (ref 8.4–10.5)
Chloride: 107 mEq/L (ref 96–112)
Creat: 0.77 mg/dL (ref 0.50–1.10)
Glucose, Bld: 103 mg/dL — ABNORMAL HIGH (ref 70–99)
Potassium: 3.9 mEq/L (ref 3.5–5.3)
Sodium: 142 mEq/L (ref 135–145)
Total Bilirubin: 0.4 mg/dL (ref 0.2–1.2)
Total Protein: 6.7 g/dL (ref 6.0–8.3)

## 2013-09-23 LAB — HEMOGLOBIN A1C
Hgb A1c MFr Bld: 6.4 % — ABNORMAL HIGH (ref <5.7)
Mean Plasma Glucose: 137 mg/dL — ABNORMAL HIGH (ref <117)

## 2013-09-24 DIAGNOSIS — M5137 Other intervertebral disc degeneration, lumbosacral region: Secondary | ICD-10-CM | POA: Diagnosis not present

## 2013-09-24 DIAGNOSIS — M1710 Unilateral primary osteoarthritis, unspecified knee: Secondary | ICD-10-CM | POA: Diagnosis not present

## 2013-09-24 DIAGNOSIS — M5030 Other cervical disc degeneration, unspecified cervical region: Secondary | ICD-10-CM | POA: Diagnosis not present

## 2013-09-24 DIAGNOSIS — M19049 Primary osteoarthritis, unspecified hand: Secondary | ICD-10-CM | POA: Diagnosis not present

## 2013-10-06 ENCOUNTER — Other Ambulatory Visit: Payer: Self-pay | Admitting: Family Medicine

## 2013-10-13 ENCOUNTER — Other Ambulatory Visit: Payer: Self-pay | Admitting: Family Medicine

## 2013-10-14 ENCOUNTER — Other Ambulatory Visit: Payer: Self-pay | Admitting: Family Medicine

## 2013-10-20 ENCOUNTER — Ambulatory Visit (INDEPENDENT_AMBULATORY_CARE_PROVIDER_SITE_OTHER): Payer: Medicare Other | Admitting: Cardiovascular Disease

## 2013-10-20 ENCOUNTER — Encounter: Payer: Self-pay | Admitting: Cardiovascular Disease

## 2013-10-20 VITALS — BP 132/86 | HR 87 | Ht 64.0 in | Wt 165.0 lb

## 2013-10-20 DIAGNOSIS — I2510 Atherosclerotic heart disease of native coronary artery without angina pectoris: Secondary | ICD-10-CM | POA: Diagnosis not present

## 2013-10-20 DIAGNOSIS — R942 Abnormal results of pulmonary function studies: Secondary | ICD-10-CM

## 2013-10-20 DIAGNOSIS — R0602 Shortness of breath: Secondary | ICD-10-CM | POA: Diagnosis not present

## 2013-10-20 DIAGNOSIS — R079 Chest pain, unspecified: Secondary | ICD-10-CM
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
## 2013-10-20 NOTE — Patient Instructions (Signed)
Your physician wants you to follow-up in: 1 year You will receive a reminder letter in the mail two months in advance. If you don't receive a letter, please call our office to schedule the follow-up appointment.    Your physician recommends that you continue on your current medications as directed. Please refer to the Current Medication list given to you today.     Thank you for choosing [facility] !  

## 2013-10-21 DIAGNOSIS — J984 Other disorders of lung: Secondary | ICD-10-CM | POA: Diagnosis not present

## 2013-10-21 DIAGNOSIS — J40 Bronchitis, not specified as acute or chronic: Secondary | ICD-10-CM | POA: Diagnosis not present

## 2013-11-19 ENCOUNTER — Other Ambulatory Visit: Payer: Self-pay

## 2013-11-19 MED ORDER — TIZANIDINE HCL 4 MG PO TABS: ORAL_TABLET | ORAL | Status: DC

## 2013-11-21 DIAGNOSIS — Z23 Encounter for immunization: Secondary | ICD-10-CM | POA: Diagnosis not present

## 2013-12-19 ENCOUNTER — Other Ambulatory Visit: Payer: Self-pay | Admitting: Family Medicine

## 2013-12-22 DIAGNOSIS — M542 Cervicalgia: Secondary | ICD-10-CM | POA: Diagnosis not present

## 2013-12-22 DIAGNOSIS — M797 Fibromyalgia: Secondary | ICD-10-CM | POA: Diagnosis not present

## 2013-12-22 DIAGNOSIS — M5030 Other cervical disc degeneration, unspecified cervical region: Secondary | ICD-10-CM | POA: Diagnosis not present

## 2013-12-22 DIAGNOSIS — M62838 Other muscle spasm: Secondary | ICD-10-CM | POA: Diagnosis not present

## 2013-12-23 ENCOUNTER — Ambulatory Visit (INDEPENDENT_AMBULATORY_CARE_PROVIDER_SITE_OTHER): Payer: Medicare Other | Admitting: Family Medicine

## 2013-12-23 ENCOUNTER — Encounter: Payer: Self-pay | Admitting: Family Medicine

## 2013-12-23 ENCOUNTER — Encounter (INDEPENDENT_AMBULATORY_CARE_PROVIDER_SITE_OTHER): Payer: Self-pay

## 2013-12-23 VITALS — BP 127/60 | HR 65 | Resp 14 | Ht 64.0 in | Wt 165.0 lb

## 2013-12-23 DIAGNOSIS — R7989 Other specified abnormal findings of blood chemistry: Secondary | ICD-10-CM

## 2013-12-23 DIAGNOSIS — R7301 Impaired fasting glucose: Secondary | ICD-10-CM

## 2013-12-23 DIAGNOSIS — E785 Hyperlipidemia, unspecified: Secondary | ICD-10-CM

## 2013-12-23 DIAGNOSIS — J449 Chronic obstructive pulmonary disease, unspecified: Secondary | ICD-10-CM | POA: Diagnosis not present

## 2013-12-23 DIAGNOSIS — I10 Essential (primary) hypertension: Secondary | ICD-10-CM | POA: Diagnosis not present

## 2013-12-23 DIAGNOSIS — Z0000 Encounter for general adult medical examination without abnormal findings: Secondary | ICD-10-CM | POA: Insufficient documentation

## 2013-12-23 DIAGNOSIS — R079 Chest pain, unspecified: Secondary | ICD-10-CM | POA: Diagnosis not present

## 2013-12-23 DIAGNOSIS — R9389 Abnormal findings on diagnostic imaging of other specified body structures: Secondary | ICD-10-CM

## 2013-12-23 NOTE — Assessment & Plan Note (Signed)

## 2013-12-23 NOTE — Progress Notes (Signed)
Subjective:    Patient ID: Andrea Santiago, female    DOB: April 24, 1940, 73 y.o.   MRN: [id number]  HPI Preventive Screening-Counseling & Management   Patient present here today for a Medicare annual wellness visit. No other health concerns are voiced or addressed   Current Problems (verified)   Medications Prior to Visit Allergies (verified)   PAST HISTORY  Family History (verified)  Social History (verified)    Risk Factors  Current exercise habits:  Stays active and exercises as able   Dietary issues discussed: Heart healthy and sticks to organic vegetables    Cardiac risk factors: htn  Depression Screen  (Note: if answer to either of the following is "Yes", a more complete depression screening is indicated)   Over the past two weeks, have you felt down, depressed or hopeless? No  Over the past two weeks, have you felt little interest or pleasure in doing things? No  Have you lost interest or pleasure in daily life? No  Do you often feel hopeless? No  Do you cry easily over simple problems? No   Activities of Daily Living  In your present state of health, do you have any difficulty performing the following activities?  Driving?: No Managing money?: No Feeding yourself?:No Getting from bed to chair?:yes at times Climbing a flight of stairs?:yes at times Preparing food and eating?:No Bathing or showering?:No Getting dressed?:No Getting to the toilet?:No Using the toilet?:No Moving around from place to place?: yes at times  Fall Risk Assessment In the past year have you fallen or had a near fall?:No Are you currently taking any medications that make you dizzy? no   Hearing Difficulties: No Do you often ask people to speak up or repeat themselves?:No Do you experience ringing or noises in your ears?:No Do you have difficulty understanding soft or whispered voices?:No  Cognitive Testing  Alert? Yes Normal Appearance?Yes  Oriented to person? Yes Place? Yes   Time? Yes  Displays appropriate judgment?Yes  Can read the correct time from a watch face? yes Are you having problems remembering things?No  Advanced Directives have been discussed with the patient?Yes , has HCPOA, full code , needs to look at more specifics r eend of life issues and is interested in doing this   List the Names of Other Physician/Practitioners you currently use: care teams updated   Indicate any recent Medical Services you may have received from other than [facility] providers in the past year (date may be approximate).   Assessment:    Annual Wellness Exam   Plan:     Medicare Attestation  I have personally reviewed:  The patient's medical and social history  Their use of alcohol, tobacco or illicit drugs  Their current medications and supplements  The patient's functional ability including ADLs,fall risks, home safety risks, cognitive, and hearing and visual impairment  Diet and physical activities  Evidence for depression or mood disorders  The patient's weight, height, BMI, and visual acuity have been recorded in the chart. I have made referrals, counseling, and provided education to the patient based on review of the above and I have provided the patient with a written personalized care plan for preventive services.      Review of Systems     Objective:   Physical Exam  BP 127/60 mmHg  Pulse 65  Resp 14  Ht 5\' 4"  (1.626 m)  Wt 165 lb (74.844 kg)  BMI 28.31 kg/m2  SpO2 95%  Assessment & Plan:  Medicare annual wellness visit, subsequent Annual exam as documented. Counseling done  re healthy lifestyle involving commitment to 150 minutes exercise per week, heart healthy diet, and attaining healthy weight.The importance of adequate sleep also discussed. Regular seat belt use and home safety, is also discussed. Changes in health habits are decided on by the patient with goals and time frames  set for achieving them. Immunization and cancer  screening needs are specifically addressed at this visit.

## 2013-12-23 NOTE — Patient Instructions (Addendum)
F/u early February, prevnar at that visit, call if you need me before  Thankful you are doing better, and that medications are helping, pLEASE [place] to next visit  Fasting lipid, cmp and EGFR, hBA1C, TSH and CBCJan 22 or after and a copy will be sent to Dr Wyman Songster will be referred for f/u chest scan Jan 30 or after, you NEED labs before the scan  Pls work on living will  Pls schedule  eye exam

## 2013-12-24 ENCOUNTER — Telehealth: Payer: Self-pay | Admitting: Family Medicine

## 2013-12-24 NOTE — Telephone Encounter (Signed)
Called patient and left message for them to return call at the office   

## 2013-12-24 NOTE — Telephone Encounter (Signed)
Needs a rx for a new lift chair and a prot. For her breasts please send to [place]

## 2013-12-26 NOTE — Telephone Encounter (Signed)
Wants rx for a lift chair and prothesis for her bra sent to [place]. These need to be on rx pad.

## 2013-12-26 NOTE — Telephone Encounter (Signed)
Scripts are signed , psl complete and send

## 2013-12-26 NOTE — Telephone Encounter (Signed)
Sent to [facility]  

## 2014-01-01 ENCOUNTER — Other Ambulatory Visit: Payer: Self-pay | Admitting: Family Medicine

## 2014-01-22 ENCOUNTER — Encounter (INDEPENDENT_AMBULATORY_CARE_PROVIDER_SITE_OTHER): Payer: Self-pay

## 2014-01-22 ENCOUNTER — Telehealth: Payer: Self-pay

## 2014-01-22 ENCOUNTER — Ambulatory Visit (INDEPENDENT_AMBULATORY_CARE_PROVIDER_SITE_OTHER): Payer: Medicare Other | Admitting: Family Medicine

## 2014-01-22 ENCOUNTER — Encounter: Payer: Self-pay | Admitting: Family Medicine

## 2014-01-22 VITALS — BP 120/70 | HR 65 | Temp 98.8°F | Resp 16 | Ht 64.0 in | Wt 165.0 lb

## 2014-01-22 DIAGNOSIS — I10 Essential (primary) hypertension: Secondary | ICD-10-CM

## 2014-01-22 DIAGNOSIS — J0100 Acute maxillary sinusitis, unspecified: Secondary | ICD-10-CM | POA: Insufficient documentation

## 2014-01-22 DIAGNOSIS — J209 Acute bronchitis, unspecified: Secondary | ICD-10-CM | POA: Insufficient documentation

## 2014-01-22 DIAGNOSIS — J0101 Acute recurrent maxillary sinusitis: Secondary | ICD-10-CM

## 2014-01-22 DIAGNOSIS — I2510 Atherosclerotic heart disease of native coronary artery without angina pectoris: Secondary | ICD-10-CM

## 2014-01-22 MED ORDER — CEFTRIAXONE SODIUM 1 G IJ SOLR
500.0000 mg | Freq: Once | INTRAMUSCULAR | Status: AC
  Administered 2014-01-22: 500 mg via INTRAMUSCULAR

## 2014-01-22 MED ORDER — BENZONATATE 100 MG PO CAPS: 100.0000 mg | ORAL_CAPSULE | Freq: Three times a day (TID) | ORAL | Status: DC | PRN

## 2014-01-22 MED ORDER — PENICILLIN V POTASSIUM 500 MG PO TABS
500.0000 mg | ORAL_TABLET | Freq: Three times a day (TID) | ORAL | Status: DC
Start: 2014-01-22 — End: 2014-03-30

## 2014-01-22 NOTE — Telephone Encounter (Signed)
Work in

## 2014-01-22 NOTE — Assessment & Plan Note (Signed)
Rocephin 500mg  iM

## 2014-01-22 NOTE — Assessment & Plan Note (Signed)
Controlled, no change in medication  

## 2014-01-22 NOTE — Assessment & Plan Note (Signed)
Pen v prescribed for 10 days, Rocephin administered in office

## 2014-01-22 NOTE — Progress Notes (Signed)
   Subjective:    Patient ID: Andrea Santiago, female    DOB: 11-21-1940, 73 y.o.   MRN: [id number]  HPI 2 day  h/o worsening head and chest congestion, associated with fever and chills intermittently. Nasal drainage has thickened , and is yellowish green, and at times bloody. Sputum is thick and yellow. Denies  bilateral ear pressure, denies hearing loss and sore throat. Increasing fatigue , poor appetitie and sleep disturbed by cough. No improvement with OTC medication.    Review of Systems See HPI  Denies PND, orthopnea, palpitations and leg swelling Denies abdominal pain, nausea, vomiting,diarrhea or constipation.   Denies dysuria, frequency, hesitancy or incontinence. Chronic joint pain, and limitation in mobility.        Objective:   Physical Exam  BP 120/70 mmHg  Pulse 65  Temp(Src) 98.8 F (37.1 C) (Oral)  Resp 16  Ht 5\' 4"  (1.626 m)  Wt 165 lb (74.844 kg)  BMI 28.31 kg/m2  SpO2 97% Patient alert and oriented and in no cardiopulmonary distress.ill appearing  HEENT: No facial asymmetry, EOMI,   oropharynx pink and moist.  Neck supple no JVD, no mass. Right maxillary sinus pressure, TM clear bilaterally Chest: decreased though adequate air entry, right basilar crackles,no wheeze  CVS: S1, S2 no murmurs, no S3.Regular rate.  ABD: Soft non tender.   Ext: No edema  MS: Adequate though reduced  ROM spine, shoulders, hips and knees.  .       Assessment & Plan:  Acute bronchitis Rocephin 500mg  iM  Essential hypertension Controlled, no change in medication   Acute maxillary sinusitis Pen v prescribed for 10 days, Rocephin administered in office

## 2014-01-22 NOTE — Patient Instructions (Signed)
F/u as before, call if you need me sooner.  You are treated for acute sinusitis, (rigth cheek) and bronchitis  Antibiotic and tylenol given in office, decongestant and penicillin have been prescribed

## 2014-01-22 NOTE — Telephone Encounter (Signed)
Started yesterday with coughing and producing thick yellow mucus, worse today with SOB, also chest tightness and some sore throat and yellow sinus congestion. Is scared and doesn't want it to turn into pneumonia. Wants shot and/or med sent in. Tessalon perles do NOT work for her. Doesn't want those. Please advise

## 2014-02-03 ENCOUNTER — Other Ambulatory Visit: Payer: Self-pay | Admitting: Family Medicine

## 2014-02-25 DIAGNOSIS — R7989 Other specified abnormal findings of blood chemistry: Secondary | ICD-10-CM | POA: Diagnosis not present

## 2014-02-25 DIAGNOSIS — R7301 Impaired fasting glucose: Secondary | ICD-10-CM | POA: Diagnosis not present

## 2014-02-25 DIAGNOSIS — E785 Hyperlipidemia, unspecified: Secondary | ICD-10-CM | POA: Diagnosis not present

## 2014-02-25 DIAGNOSIS — Z0000 Encounter for general adult medical examination without abnormal findings: Secondary | ICD-10-CM | POA: Diagnosis not present

## 2014-02-25 DIAGNOSIS — Z79899 Other long term (current) drug therapy: Secondary | ICD-10-CM | POA: Diagnosis not present

## 2014-02-25 DIAGNOSIS — I10 Essential (primary) hypertension: Secondary | ICD-10-CM | POA: Diagnosis not present

## 2014-02-26 LAB — COMPLETE METABOLIC PANEL WITH GFR

## 2014-02-26 LAB — CBC
HCT: 41.8 % (ref 36.0–46.0)
Hemoglobin: 13.9 g/dL (ref 12.0–15.0)
MCH: 30.3 pg (ref 26.0–34.0)
MCHC: 33.3 g/dL (ref 30.0–36.0)
MCV: 91.3 fL (ref 78.0–100.0)
Platelets: 284 10*3/uL (ref 150–400)
RBC: 4.58 MIL/uL (ref 3.87–5.11)
RDW: 14.4 % (ref 11.5–15.5)
WBC: 5.8 10*3/uL (ref 4.0–10.5)

## 2014-02-26 LAB — HEMOGLOBIN A1C
Hgb A1c MFr Bld: 6.2 % — ABNORMAL HIGH (ref <5.7)
Mean Plasma Glucose: 131 mg/dL — ABNORMAL HIGH (ref <117)

## 2014-02-26 LAB — LIPID PANEL
Cholesterol: 187 mg/dL (ref 0–200)
HDL: 58 mg/dL (ref >39)
LDL Cholesterol: 106 mg/dL — ABNORMAL HIGH (ref 0–99)
Total CHOL/HDL Ratio: 3.2 Ratio
Triglycerides: 113 mg/dL (ref <150)
VLDL: 23 mg/dL (ref 0–40)

## 2014-02-26 LAB — TSH: TSH: 1.614 u[IU]/mL (ref 0.350–4.500)

## 2014-03-02 ENCOUNTER — Encounter: Payer: Self-pay | Admitting: Family Medicine

## 2014-03-11 ENCOUNTER — Other Ambulatory Visit: Payer: Self-pay | Admitting: Family Medicine

## 2014-03-17 ENCOUNTER — Telehealth: Payer: Self-pay | Admitting: *Deleted

## 2014-03-17 NOTE — Telephone Encounter (Signed)
I called pt and LMOM about her appt 03/25/14 at 9:30 at [facility] for her CT.

## 2014-03-23 ENCOUNTER — Other Ambulatory Visit (HOSPITAL_COMMUNITY): Payer: Medicare Other

## 2014-03-25 ENCOUNTER — Encounter (HOSPITAL_COMMUNITY): Payer: Self-pay

## 2014-03-25 ENCOUNTER — Ambulatory Visit (HOSPITAL_COMMUNITY)
Admission: RE | Admit: 2014-03-25 | Discharge: 2014-03-25 | Disposition: A | Discharge status: Home / Self Care | Payer: Medicare Other | Source: Ambulatory Visit | Attending: Family Medicine | Admitting: Family Medicine

## 2014-03-25 DIAGNOSIS — R911 Solitary pulmonary nodule: Secondary | ICD-10-CM | POA: Insufficient documentation

## 2014-03-25 DIAGNOSIS — E042 Nontoxic multinodular goiter: Secondary | ICD-10-CM | POA: Insufficient documentation

## 2014-03-25 DIAGNOSIS — R9389 Abnormal findings on diagnostic imaging of other specified body structures: Secondary | ICD-10-CM

## 2014-03-25 LAB — POCT I-STAT CREATININE: Creatinine, Ser: 1.2 mg/dL — ABNORMAL HIGH (ref 0.50–1.10)

## 2014-03-25 MED ORDER — IOHEXOL 300 MG/ML  SOLN
80.0000 mL | Freq: Once | INTRAMUSCULAR | Status: AC | PRN
  Administered 2014-03-25: 80 mL via INTRAVENOUS

## 2014-03-30 ENCOUNTER — Encounter: Payer: Self-pay | Admitting: Family Medicine

## 2014-03-30 ENCOUNTER — Ambulatory Visit (INDEPENDENT_AMBULATORY_CARE_PROVIDER_SITE_OTHER): Payer: Medicare Other | Admitting: Family Medicine

## 2014-03-30 ENCOUNTER — Ambulatory Visit (HOSPITAL_COMMUNITY)
Admission: RE | Admit: 2014-03-30 | Discharge: 2014-03-30 | Disposition: A | Discharge status: Home / Self Care | Payer: Medicare Other | Source: Ambulatory Visit | Attending: Family Medicine | Admitting: Family Medicine

## 2014-03-30 VITALS — BP 116/68 | HR 87 | Resp 16 | Ht 64.0 in | Wt 163.0 lb

## 2014-03-30 DIAGNOSIS — R911 Solitary pulmonary nodule: Secondary | ICD-10-CM

## 2014-03-30 DIAGNOSIS — R7301 Impaired fasting glucose: Secondary | ICD-10-CM

## 2014-03-30 DIAGNOSIS — E785 Hyperlipidemia, unspecified: Secondary | ICD-10-CM | POA: Diagnosis not present

## 2014-03-30 DIAGNOSIS — K219 Gastro-esophageal reflux disease without esophagitis: Secondary | ICD-10-CM | POA: Diagnosis not present

## 2014-03-30 DIAGNOSIS — E663 Overweight: Secondary | ICD-10-CM | POA: Diagnosis not present

## 2014-03-30 DIAGNOSIS — R52 Pain, unspecified: Secondary | ICD-10-CM | POA: Diagnosis not present

## 2014-03-30 DIAGNOSIS — I10 Essential (primary) hypertension: Secondary | ICD-10-CM | POA: Diagnosis not present

## 2014-03-30 DIAGNOSIS — M19011 Primary osteoarthritis, right shoulder: Secondary | ICD-10-CM | POA: Diagnosis not present

## 2014-03-30 DIAGNOSIS — Z23 Encounter for immunization: Secondary | ICD-10-CM | POA: Diagnosis not present

## 2014-03-30 DIAGNOSIS — M25511 Pain in right shoulder: Secondary | ICD-10-CM | POA: Insufficient documentation

## 2014-03-30 DIAGNOSIS — R7303 Prediabetes: Secondary | ICD-10-CM

## 2014-03-30 DIAGNOSIS — R7309 Other abnormal glucose: Secondary | ICD-10-CM | POA: Diagnosis not present

## 2014-03-30 DIAGNOSIS — E042 Nontoxic multinodular goiter: Secondary | ICD-10-CM

## 2014-03-30 MED ORDER — POTASSIUM CHLORIDE CRYS ER 10 MEQ PO TBCR: 30.0000 meq | EXTENDED_RELEASE_TABLET | Freq: Every day | ORAL | Status: DC

## 2014-03-30 MED ORDER — OMEPRAZOLE 20 MG PO CPDR: 20.0000 mg | DELAYED_RELEASE_CAPSULE | Freq: Every day | ORAL | Status: DC

## 2014-03-30 MED ORDER — MELOXICAM 7.5 MG PO TABS: 7.5000 mg | ORAL_TABLET | Freq: Every day | ORAL | Status: DC

## 2014-03-30 MED ORDER — PREDNISONE 5 MG PO TABS: 5.0000 mg | ORAL_TABLET | Freq: Two times a day (BID) | ORAL | Status: AC

## 2014-03-30 MED ORDER — HYDROCHLOROTHIAZIDE 25 MG PO TABS: 25.0000 mg | ORAL_TABLET | Freq: Every day | ORAL | Status: DC

## 2014-03-30 MED ORDER — TIZANIDINE HCL 4 MG PO TABS: ORAL_TABLET | ORAL | Status: DC

## 2014-03-30 NOTE — Progress Notes (Signed)
Subjective:    Patient ID: Andrea Santiago, female    DOB: 12-Jan-1941, 74 y.o.   MRN: [id number]  HPI The PT is here for follow up and re-evaluation of chronic medical conditions, medication management and review of any available recent lab and radiology data.  Preventive health is updated, specifically  Cancer screening and Immunization.   Questions or concerns regarding consultations or procedures which the PT has had in the interim are  addressed. The PT denies any adverse reactions to current medications since the last visit.  Increased and uncontrolled generalized pain in past several weeks also right shoulder painful with limitation in mobility, no recent trauma     Review of Systems See HPI Denies recent fever or chills. Denies sinus pressure, nasal congestion, ear pain or sore throat. Denies chest congestion, productive cough or wheezing. Denies chest pains, palpitations and leg swelling Denies abdominal pain, nausea, vomiting,diarrhea or constipation.   Denies dysuria, frequency, hesitancy or incontinence. . Denies headaches, seizures, numbness, or tingling. Denies depression, anxiety or insomnia. Denies skin break down or rash.        Objective:   Physical Exam BP 116/68 mmHg  Pulse 87  Resp 16  Ht 5\' 4"  (1.626 m)  Wt 163 lb (73.936 kg)  BMI 27.97 kg/m2  SpO2 96% Patient alert and oriented and in no cardiopulmonary distress.  HEENT: No facial asymmetry, EOMI,   oropharynx pink and moist.  Neck decreased ROM no JVD, no mass.TM clear bilaterally, no sinus tenderness  Chest: Clear to auscultation bilaterally.  CVS: S1, S2 no murmurs, no S3.Regular rate.  ABD: Soft non tender.   Ext: No edema  MS: decreased  ROM thoraco lumbar spine, normal in  hips and knees.Decreased ROM right shoulder   Skin: Intact, no ulcerations or rash noted.  Psych: Good eye contact, normal affect. Memory intact not anxious or depressed appearing.  CNS: CN 2-12 intact, power,   normal throughout.no focal deficits noted.        Assessment & Plan:  Shoulder pain, right Uncontrolled and increased pain , oral steroids and xray and anti inflammatory short course, will refer for PT if persists   Nodule of left lung Repeat imaging . Chest scan, shows no change in nodule, no further imaging recommended   Generalized pain Increased and uncontrolled, short anti inflammatory course prescribed   Multiple thyroid nodules Dedicated ultrasound needed, will follow as dee,med appropriate, her thyroid function test is normal   Impaired fasting glucose Improved, pt congratulated on  This  Patient educated about the importance of limiting  Carbohydrate intake , the need to commit to daily physical activity for a minimum of 30 minutes , and to commit weight loss. The fact that changes in all these areas will reduce or eliminate all together the development of diabetes is stressed.   Updated lab needed at/ before next visit.    Essential hypertension Controlled, no change in medication DASH diet and commitment to daily physical activity for a minimum of 30 minutes discussed and encouraged, as a part of hypertension management. The importance of attaining a healthy weight is also discussed.    GERD Controlled, no change in medication    Hyperlipemia Deteriorated Hyperlipidemia:Low fat diet discussed and encouraged.  Updated lab needed at/ before next visit.    Overweight Improved. Pt applauded on succesful weight loss through lifestyle change, and encouraged to continue same. Weight loss goal set for the next several months.    Need for vaccination with  13-polyvalent pneumococcal conjugate vaccine Vaccine administered at visit.

## 2014-03-30 NOTE — Patient Instructions (Addendum)
Annual physical in 4 months, call if you need me before  Prevnar today   Congrats on improved blood sugar and weight loss, keep it up  Reduce cheese, and fatty foods, bad cholesterol has worsened  Xray of right shoulder today  Prednisone sent for 5 days and low dose meloxicam to take daily to help with chronic pain  Thyroid US  To evaluate nodules  Fasting lipid, cmp, hBa1c in 4 month

## 2014-03-31 ENCOUNTER — Encounter: Payer: Self-pay | Admitting: Family Medicine

## 2014-04-02 ENCOUNTER — Other Ambulatory Visit: Payer: Self-pay

## 2014-04-02 ENCOUNTER — Telehealth: Payer: Self-pay | Admitting: Family Medicine

## 2014-04-02 MED ORDER — NIACIN ER (ANTIHYPERLIPIDEMIC) 1000 MG PO TBCR: 2000.0000 mg | EXTENDED_RELEASE_TABLET | Freq: Every day | ORAL | Status: DC

## 2014-04-03 ENCOUNTER — Other Ambulatory Visit: Payer: Self-pay | Admitting: Family Medicine

## 2014-04-03 ENCOUNTER — Ambulatory Visit (HOSPITAL_COMMUNITY)
Admission: RE | Admit: 2014-04-03 | Discharge: 2014-04-03 | Disposition: A | Discharge status: Home / Self Care | Payer: Medicare Other | Source: Ambulatory Visit | Attending: Family Medicine | Admitting: Family Medicine

## 2014-04-03 DIAGNOSIS — Z1231 Encounter for screening mammogram for malignant neoplasm of breast: Secondary | ICD-10-CM

## 2014-04-03 DIAGNOSIS — E042 Nontoxic multinodular goiter: Secondary | ICD-10-CM | POA: Diagnosis present

## 2014-04-03 NOTE — Telephone Encounter (Signed)
Called patient and left message for them to return call at the office   

## 2014-04-07 ENCOUNTER — Other Ambulatory Visit: Payer: Self-pay | Admitting: Family Medicine

## 2014-04-08 ENCOUNTER — Ambulatory Visit (HOSPITAL_COMMUNITY): Payer: Medicare Other

## 2014-04-09 ENCOUNTER — Ambulatory Visit (HOSPITAL_COMMUNITY): Payer: Medicare Other

## 2014-04-13 ENCOUNTER — Telehealth: Payer: Self-pay

## 2014-04-13 ENCOUNTER — Emergency Department (HOSPITAL_COMMUNITY)
Admission: EM | Admit: 2014-04-13 | Discharge: 2014-04-13 | Disposition: A | Discharge status: Home / Self Care | Payer: Medicare Other | Attending: Emergency Medicine | Admitting: Emergency Medicine

## 2014-04-13 ENCOUNTER — Emergency Department (HOSPITAL_COMMUNITY): Payer: Medicare Other

## 2014-04-13 ENCOUNTER — Encounter (HOSPITAL_COMMUNITY): Payer: Self-pay | Admitting: Emergency Medicine

## 2014-04-13 ENCOUNTER — Ambulatory Visit (HOSPITAL_COMMUNITY): Payer: Medicare Other

## 2014-04-13 DIAGNOSIS — Z7982 Long term (current) use of aspirin: Secondary | ICD-10-CM | POA: Insufficient documentation

## 2014-04-13 DIAGNOSIS — Z8669 Personal history of other diseases of the nervous system and sense organs: Secondary | ICD-10-CM | POA: Insufficient documentation

## 2014-04-13 DIAGNOSIS — J40 Bronchitis, not specified as acute or chronic: Secondary | ICD-10-CM | POA: Diagnosis not present

## 2014-04-13 DIAGNOSIS — Z79899 Other long term (current) drug therapy: Secondary | ICD-10-CM | POA: Insufficient documentation

## 2014-04-13 DIAGNOSIS — F329 Major depressive disorder, single episode, unspecified: Secondary | ICD-10-CM | POA: Insufficient documentation

## 2014-04-13 DIAGNOSIS — Z8639 Personal history of other endocrine, nutritional and metabolic disease: Secondary | ICD-10-CM | POA: Diagnosis not present

## 2014-04-13 DIAGNOSIS — K5900 Constipation, unspecified: Secondary | ICD-10-CM | POA: Insufficient documentation

## 2014-04-13 DIAGNOSIS — Z7901 Long term (current) use of anticoagulants: Secondary | ICD-10-CM | POA: Insufficient documentation

## 2014-04-13 DIAGNOSIS — Z791 Long term (current) use of non-steroidal anti-inflammatories (NSAID): Secondary | ICD-10-CM | POA: Insufficient documentation

## 2014-04-13 DIAGNOSIS — R05 Cough: Secondary | ICD-10-CM

## 2014-04-13 DIAGNOSIS — M797 Fibromyalgia: Secondary | ICD-10-CM | POA: Diagnosis not present

## 2014-04-13 DIAGNOSIS — K219 Gastro-esophageal reflux disease without esophagitis: Secondary | ICD-10-CM | POA: Diagnosis not present

## 2014-04-13 DIAGNOSIS — R059 Cough, unspecified: Secondary | ICD-10-CM

## 2014-04-13 DIAGNOSIS — R0602 Shortness of breath: Secondary | ICD-10-CM | POA: Diagnosis not present

## 2014-04-13 DIAGNOSIS — Z87891 Personal history of nicotine dependence: Secondary | ICD-10-CM | POA: Diagnosis not present

## 2014-04-13 DIAGNOSIS — Z7952 Long term (current) use of systemic steroids: Secondary | ICD-10-CM | POA: Diagnosis not present

## 2014-04-13 DIAGNOSIS — Z23 Encounter for immunization: Secondary | ICD-10-CM | POA: Insufficient documentation

## 2014-04-13 MED ORDER — PREDNISONE 20 MG PO TABS
40.0000 mg | ORAL_TABLET | Freq: Once | ORAL | Status: AC
  Administered 2014-04-13: 40 mg via ORAL
  Filled 2014-04-13: qty 2

## 2014-04-13 MED ORDER — PREDNISONE 10 MG PO TABS: 40.0000 mg | ORAL_TABLET | Freq: Every day | ORAL | Status: DC
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
## 2014-04-13 NOTE — Assessment & Plan Note (Signed)
Uncontrolled and increased pain , oral steroids and xray and anti inflammatory short course, will refer for PT if persists

## 2014-04-13 NOTE — Assessment & Plan Note (Signed)
Improved, pt congratulated on  This  Patient educated about the importance of limiting  Carbohydrate intake , the need to commit to daily physical activity for a minimum of 30 minutes , and to commit weight loss. The fact that changes in all these areas will reduce or eliminate all together the development of diabetes is stressed.   Updated lab needed at/ before next visit.

## 2014-04-13 NOTE — Assessment & Plan Note (Signed)
Controlled, no change in medication  

## 2014-04-13 NOTE — Assessment & Plan Note (Signed)
Increased and uncontrolled, short anti inflammatory course prescribed

## 2014-04-13 NOTE — Assessment & Plan Note (Signed)
Dedicated ultrasound needed, will follow as dee,med appropriate, her thyroid function test is normal

## 2014-04-13 NOTE — ED Notes (Signed)
Pt reports onset of productive cough with thick yellowish colored sputum.

## 2014-04-13 NOTE — Assessment & Plan Note (Signed)
Improved. Pt applauded on succesful weight loss through lifestyle change, and encouraged to continue same. Weight loss goal set for the next several months.  

## 2014-04-13 NOTE — ED Notes (Signed)
MD at bedside. 

## 2014-04-13 NOTE — Discharge Instructions (Signed)
Continue usual albuterol inhaler 2 puffs every 6 hours. Take prednisone as directed. Use Mucinex DM every 12 hours. Follow-up with your regular doctor. Return for any new or worse symptoms. Chest x-ray negative for pneumonia.

## 2014-04-13 NOTE — Assessment & Plan Note (Signed)
Controlled, no change in medication DASH diet and commitment to daily physical activity for a minimum of 30 minutes discussed and encouraged, as a part of hypertension management. The importance of attaining a healthy weight is also discussed.  

## 2014-04-13 NOTE — Assessment & Plan Note (Signed)
Vaccine administered at visit.  

## 2014-04-13 NOTE — Telephone Encounter (Signed)
States she thinks she has pneumonia. Hurting in chest, SOB, fever. Advised ER and she agrees

## 2014-04-13 NOTE — Assessment & Plan Note (Signed)
Deteriorated Hyperlipidemia:Low fat diet discussed and encouraged.  Updated lab needed at/ before next visit.

## 2014-04-13 NOTE — Assessment & Plan Note (Signed)
Repeat imaging . Chest scan, shows no change in nodule, no further imaging recommended

## 2014-04-15 ENCOUNTER — Ambulatory Visit (HOSPITAL_COMMUNITY): Payer: Medicare Other

## 2014-04-16 IMAGING — CR DG CHEST 2V
2 series · 2 of 2 positions shown · non-contrast
Comparison: 12/27/2012

CLINICAL DATA: Acute bronchitis

EXAM:
CHEST  2 VIEW

[view not recorded (1 of 2)]
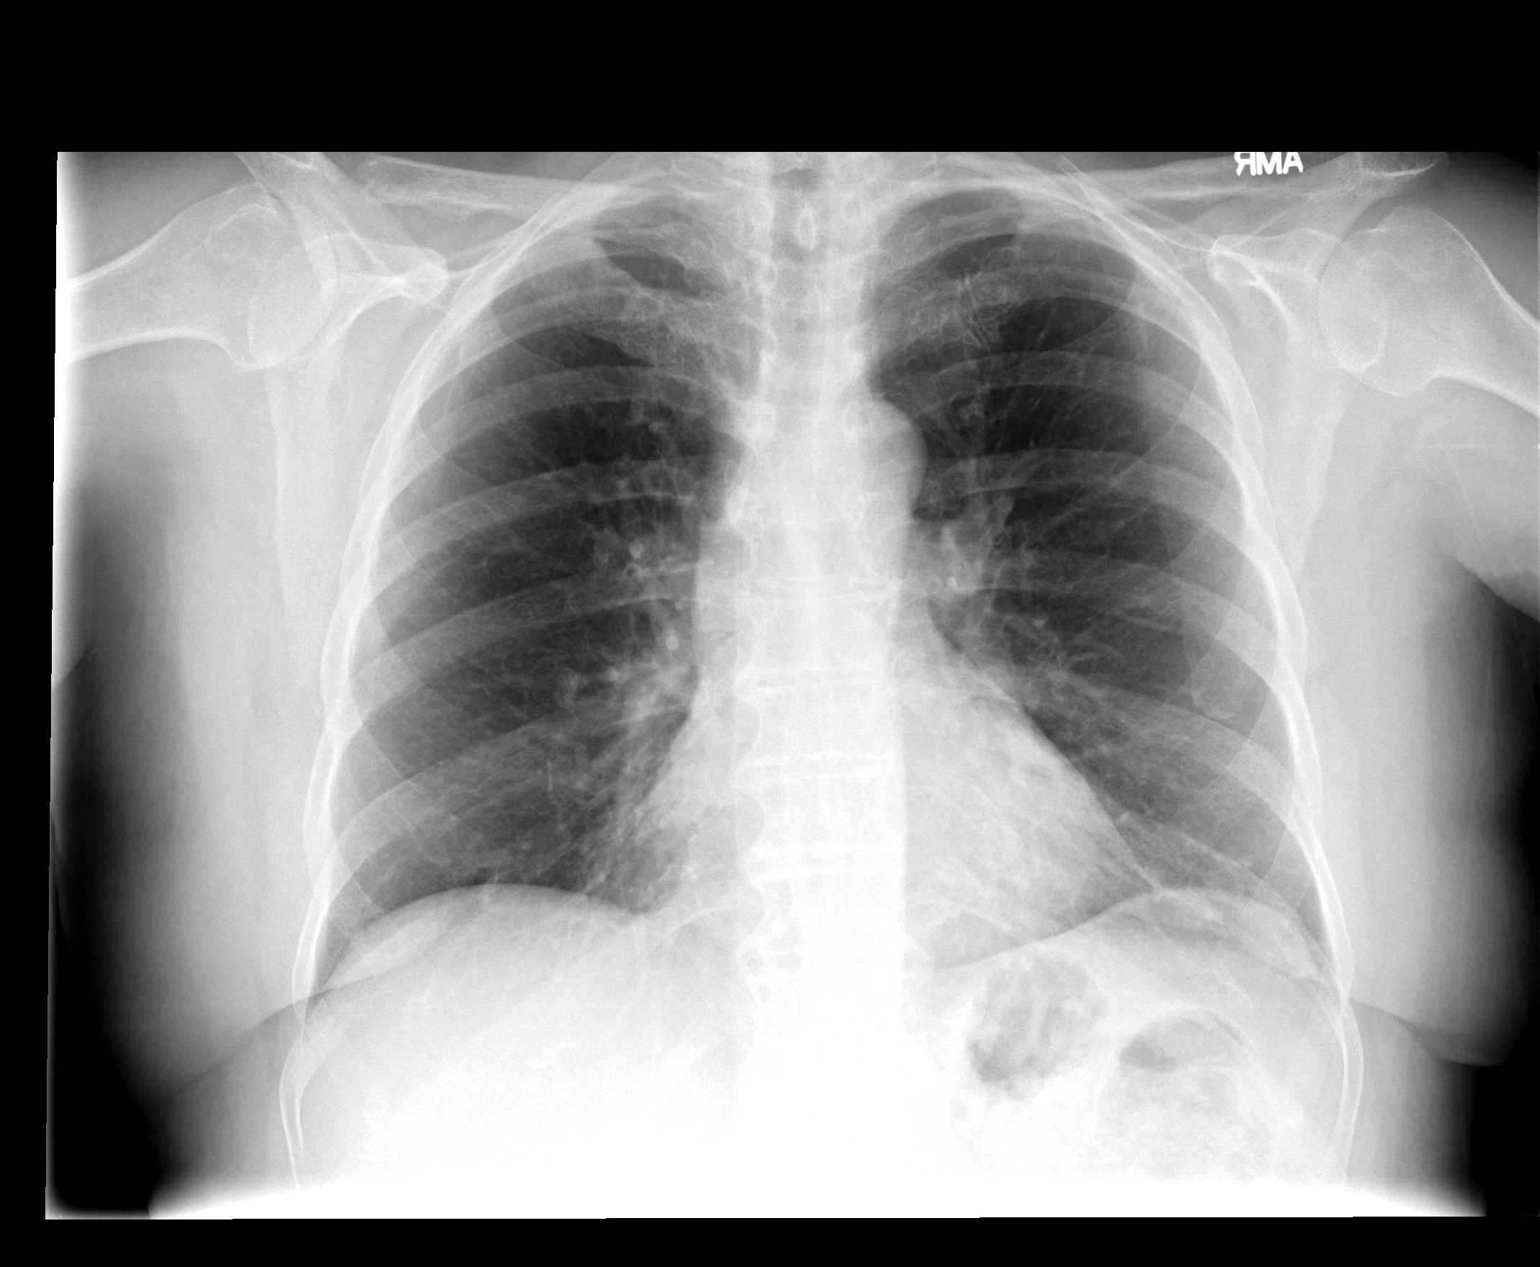

[view not recorded (2 of 2)]
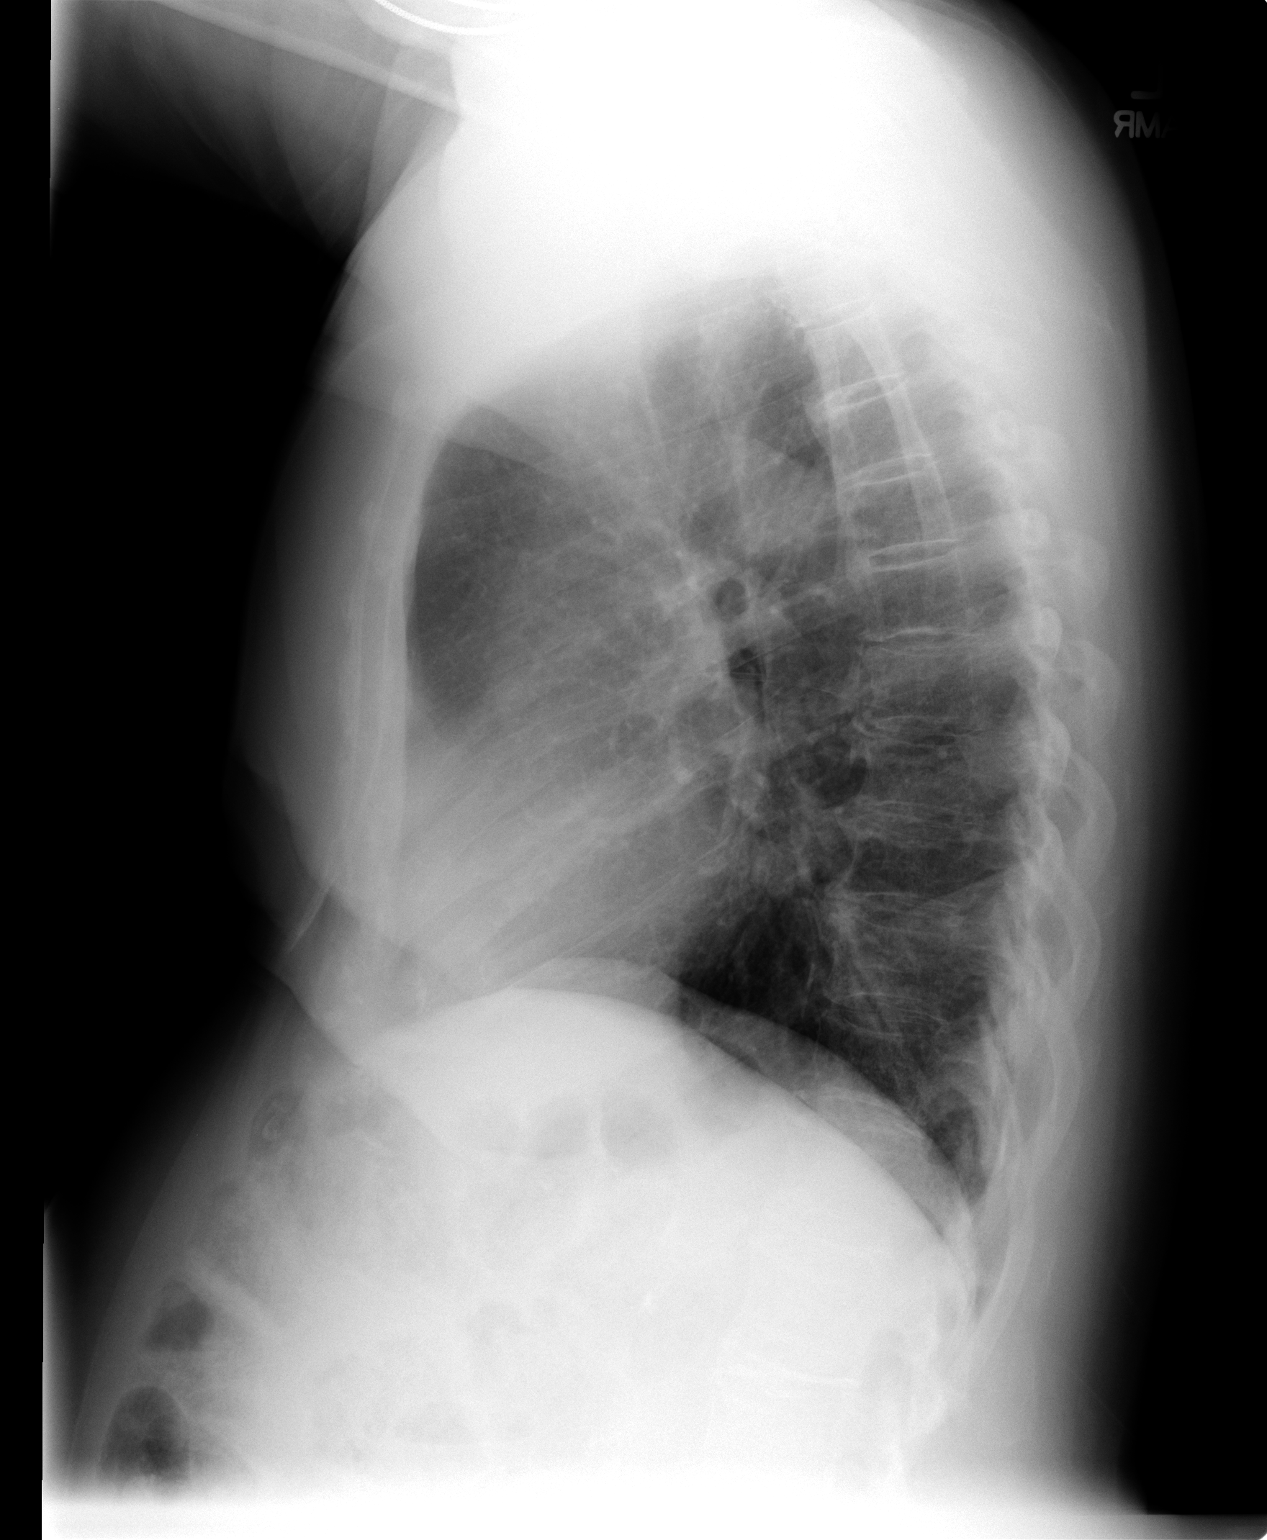

[2 of 2 positions shown; findings below may reference images not displayed]

FINDINGS: Cardiac shadow is within normal limits. Increased density is noted
in the medial aspect of the right lung base projecting in the right
middle lobe consistent with early infiltrate. No sizable effusion is
seen. Bony structures are stable.
IMPRESSION: Early right middle lobe infiltrate.

## 2014-04-17 ENCOUNTER — Other Ambulatory Visit: Payer: Self-pay | Admitting: Family Medicine

## 2014-04-22 ENCOUNTER — Ambulatory Visit (HOSPITAL_COMMUNITY): Payer: Medicare Other

## 2014-04-24 ENCOUNTER — Ambulatory Visit (HOSPITAL_COMMUNITY)
Admission: RE | Admit: 2014-04-24 | Discharge: 2014-04-24 | Disposition: A | Discharge status: Home / Self Care | Payer: Medicare Other | Source: Ambulatory Visit | Attending: Family Medicine | Admitting: Family Medicine

## 2014-04-24 ENCOUNTER — Other Ambulatory Visit: Payer: Self-pay | Admitting: Family Medicine

## 2014-04-24 DIAGNOSIS — Z1231 Encounter for screening mammogram for malignant neoplasm of breast: Secondary | ICD-10-CM | POA: Insufficient documentation

## 2014-05-06 ENCOUNTER — Other Ambulatory Visit: Payer: Self-pay | Admitting: Family Medicine

## 2014-05-25 DIAGNOSIS — M797 Fibromyalgia: Secondary | ICD-10-CM | POA: Diagnosis not present

## 2014-05-25 DIAGNOSIS — M19041 Primary osteoarthritis, right hand: Secondary | ICD-10-CM | POA: Diagnosis not present

## 2014-05-25 DIAGNOSIS — M5137 Other intervertebral disc degeneration, lumbosacral region: Secondary | ICD-10-CM | POA: Diagnosis not present

## 2014-05-25 DIAGNOSIS — M170 Bilateral primary osteoarthritis of knee: Secondary | ICD-10-CM | POA: Diagnosis not present

## 2014-05-25 DIAGNOSIS — M542 Cervicalgia: Secondary | ICD-10-CM | POA: Diagnosis not present

## 2014-08-17 ENCOUNTER — Other Ambulatory Visit: Payer: Self-pay | Admitting: Family Medicine

## 2014-08-17 DIAGNOSIS — E785 Hyperlipidemia, unspecified: Secondary | ICD-10-CM | POA: Diagnosis not present

## 2014-08-17 DIAGNOSIS — I10 Essential (primary) hypertension: Secondary | ICD-10-CM | POA: Diagnosis not present

## 2014-08-17 DIAGNOSIS — R7309 Other abnormal glucose: Secondary | ICD-10-CM | POA: Diagnosis not present

## 2014-08-18 LAB — LIPID PANEL
Cholesterol: 182 mg/dL (ref 0–200)
HDL: 51 mg/dL (ref >=46)
LDL Cholesterol: 101 mg/dL — ABNORMAL HIGH (ref 0–99)
Total CHOL/HDL Ratio: 3.6 Ratio
Triglycerides: 152 mg/dL — ABNORMAL HIGH (ref <150)
VLDL: 30 mg/dL (ref 0–40)

## 2014-08-18 LAB — COMPREHENSIVE METABOLIC PANEL
ALT: 23 U/L (ref 0–35)
AST: 25 U/L (ref 0–37)
Albumin: 4.2 g/dL (ref 3.5–5.2)
Alkaline Phosphatase: 40 U/L (ref 39–117)
BUN: 27 mg/dL — ABNORMAL HIGH (ref 6–23)
CO2: 27 mEq/L (ref 19–32)
Calcium: 9.9 mg/dL (ref 8.4–10.5)
Chloride: 106 mEq/L (ref 96–112)
Creat: 1.04 mg/dL (ref 0.50–1.10)
Glucose, Bld: 114 mg/dL — ABNORMAL HIGH (ref 70–99)
Potassium: 4.1 mEq/L (ref 3.5–5.3)
Sodium: 143 mEq/L (ref 135–145)
Total Bilirubin: 0.4 mg/dL (ref 0.2–1.2)
Total Protein: 7.1 g/dL (ref 6.0–8.3)

## 2014-08-18 LAB — HEMOGLOBIN A1C
Hgb A1c MFr Bld: 6.3 % — ABNORMAL HIGH (ref <5.7)
Mean Plasma Glucose: 134 mg/dL — ABNORMAL HIGH (ref <117)

## 2014-08-20 ENCOUNTER — Encounter: Payer: Medicare Other | Admitting: Family Medicine

## 2014-09-08 ENCOUNTER — Other Ambulatory Visit: Payer: Self-pay | Admitting: Family Medicine

## 2014-09-14 ENCOUNTER — Other Ambulatory Visit: Payer: Self-pay | Admitting: Family Medicine

## 2014-09-20 ENCOUNTER — Other Ambulatory Visit: Payer: Self-pay | Admitting: Family Medicine

## 2014-09-30 ENCOUNTER — Other Ambulatory Visit: Payer: Self-pay | Admitting: Family Medicine

## 2014-10-01 ENCOUNTER — Encounter: Payer: Self-pay | Admitting: Cardiovascular Disease

## 2014-10-01 ENCOUNTER — Ambulatory Visit (INDEPENDENT_AMBULATORY_CARE_PROVIDER_SITE_OTHER): Payer: Medicare Other | Admitting: Cardiovascular Disease

## 2014-10-01 VITALS — BP 124/58 | HR 60 | Ht 64.0 in | Wt 159.0 lb

## 2014-10-01 DIAGNOSIS — I10 Essential (primary) hypertension: Secondary | ICD-10-CM

## 2014-10-01 DIAGNOSIS — I359 Nonrheumatic aortic valve disorder, unspecified: Secondary | ICD-10-CM

## 2014-10-01 DIAGNOSIS — R0602 Shortness of breath: Secondary | ICD-10-CM

## 2014-10-01 DIAGNOSIS — R079 Chest pain, unspecified: Secondary | ICD-10-CM | POA: Diagnosis not present

## 2014-10-01 DIAGNOSIS — R942 Abnormal results of pulmonary function studies: Secondary | ICD-10-CM

## 2014-10-01 DIAGNOSIS — I2510 Atherosclerotic heart disease of native coronary artery without angina pectoris: Secondary | ICD-10-CM
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
## 2014-10-01 NOTE — Patient Instructions (Signed)
Your physician recommends that you schedule a follow-up appointment in: as needed     Your physician recommends that you continue on your current medications as directed. Please refer to the Current Medication list given to you today.   Thank you for choosing [facility] !         

## 2014-10-12 ENCOUNTER — Other Ambulatory Visit: Payer: Self-pay | Admitting: Family Medicine

## 2014-10-19 ENCOUNTER — Other Ambulatory Visit: Payer: Self-pay | Admitting: Family Medicine

## 2014-11-02 ENCOUNTER — Ambulatory Visit (INDEPENDENT_AMBULATORY_CARE_PROVIDER_SITE_OTHER): Payer: Medicare Other | Admitting: Family Medicine

## 2014-11-02 ENCOUNTER — Encounter: Payer: Self-pay | Admitting: Family Medicine

## 2014-11-02 ENCOUNTER — Telehealth: Payer: Self-pay | Admitting: *Deleted

## 2014-11-02 VITALS — BP 106/68 | HR 74 | Resp 16 | Ht 64.0 in | Wt 161.0 lb

## 2014-11-02 DIAGNOSIS — I10 Essential (primary) hypertension: Secondary | ICD-10-CM | POA: Diagnosis not present

## 2014-11-02 DIAGNOSIS — R7303 Prediabetes: Secondary | ICD-10-CM

## 2014-11-02 DIAGNOSIS — E785 Hyperlipidemia, unspecified: Secondary | ICD-10-CM

## 2014-11-02 DIAGNOSIS — I2510 Atherosclerotic heart disease of native coronary artery without angina pectoris: Secondary | ICD-10-CM | POA: Diagnosis not present

## 2014-11-02 DIAGNOSIS — R7309 Other abnormal glucose: Secondary | ICD-10-CM

## 2014-11-02 DIAGNOSIS — R05 Cough: Secondary | ICD-10-CM

## 2014-11-02 DIAGNOSIS — M791 Myalgia: Secondary | ICD-10-CM

## 2014-11-02 DIAGNOSIS — Z23 Encounter for immunization: Secondary | ICD-10-CM | POA: Diagnosis not present

## 2014-11-02 DIAGNOSIS — R52 Pain, unspecified: Secondary | ICD-10-CM

## 2014-11-02 DIAGNOSIS — IMO0001 Reserved for inherently not codable concepts without codable children: Secondary | ICD-10-CM

## 2014-11-02 DIAGNOSIS — R7301 Impaired fasting glucose: Secondary | ICD-10-CM

## 2014-11-02 DIAGNOSIS — M609 Myositis, unspecified: Secondary | ICD-10-CM

## 2014-11-02 DIAGNOSIS — I8003 Phlebitis and thrombophlebitis of superficial vessels of lower extremities, bilateral: Secondary | ICD-10-CM | POA: Insufficient documentation

## 2014-11-02 DIAGNOSIS — R058 Other specified cough: Secondary | ICD-10-CM

## 2014-11-02 DIAGNOSIS — R079 Chest pain, unspecified: Secondary | ICD-10-CM | POA: Insufficient documentation

## 2014-11-02 MED ORDER — KETOROLAC TROMETHAMINE 60 MG/2ML IM SOLN
60.0000 mg | Freq: Once | INTRAMUSCULAR | Status: AC
  Administered 2014-11-02: 60 mg via INTRAMUSCULAR

## 2014-11-02 MED ORDER — METHYLPREDNISOLONE ACETATE 80 MG/ML IJ SUSP
80.0000 mg | Freq: Once | INTRAMUSCULAR | Status: AC
  Administered 2014-11-02: 80 mg via INTRAMUSCULAR

## 2014-11-02 NOTE — Telephone Encounter (Signed)
I am waiting on a appt from [facility], referral has been faxed 11/02/14

## 2014-11-02 NOTE — Patient Instructions (Addendum)
Annual wellness in 5 month, call if you need me before  CX today  Flu vaccine today  Toradol and depo medrol in office today for generalized pain  You are referred to vascular surgery to evaluate painful swollen veins in legs  Fasting lipid, cmp and EGFr, hBA1C, cBC and TSH in 5 month  Thanks for choosing [facility], we consider it a privelige to serve you.

## 2014-11-02 NOTE — Progress Notes (Signed)
Subjective:    Patient ID: Andrea Santiago, female    DOB: June 02, 1940, 74 y.o.   MRN: [id number]  HPI   Andrea Santiago     MRN: [id number]      DOB: 02-14-1941   HPI Andrea Santiago is here for follow up and re-evaluation of chronic medical conditions, medication management and review of any available recent lab and radiology data.  Preventive health is updated, specifically  Cancer screening and Immunization.   States she has had 2 rounds of bronchitis since last seen, was treated with antibiotics by Dr Luan Pulling C/o hurting all over requests injection has f/u soon with rheumatologist Concerned that recent mammogram was not done "correctly" wants to request specific technician next year The PT denies any adverse reactions to current medications since the last visit.  Requests CXR due to chronic right chest pain and recurrent "bronchitis"  Recently saw her cardiologist who assured her that the pain was not cardiac, states she has had intermittent chills and yellow sputum for past 1 week, no documented fever C/o tender swollen veins in ;lower extremities wants help  ROS  Denies sinus pressure, nasal congestion, ear pain or sore throat.  Denies PND, orthopnea, palpitations and leg swelling Denies abdominal pain, nausea, vomiting,diarrhea or constipation.   Denies dysuria, frequency, hesitancy or incontinence.  Denies headaches, seizures, numbness, or tingling. Denies uncontrolled  depression, anxiety or insomnia. Denies skin break down or rash. Requests vascular eval of veins in her legs which are increasingly distended and swollen   PE  BP 106/68 mmHg  Pulse 74  Resp 16  Ht 5\' 4"  (1.626 m)  Wt 161 lb (73.029 kg)  BMI 27.62 kg/m2  SpO2 98%  Patient alert and oriented and in no cardiopulmonary distress.  HEENT: No facial asymmetry, EOMI,   oropharynx pink and moist.  Neck supple no JVD, no mass.  Chest: Clear to auscultation bilaterally.  CVS: S1, S2 no murmurs, no S3.Regular  rate.Distended superfiucial veins in lower extremities from thigh to ankle, no erythema or warmth  ABD: Soft non tender.   Ext: No edema  BD:ZHGDJME ROM spine, shoulders, hips and knees.  Skin: Intact, no ulcerations or rash noted.  Psych: Good eye contact, normal affect. Memory intact not anxious or depressed appearing.  CNS: CN 2-12 intact, power,  normal throughout.no focal deficits noted.   Assessment & Plan  Right-sided chest pain Chronic right chest pain, cough productive of yellow sputum x 1 week, intermittent chills  Essential hypertension Controlled, no change in medication DASH diet and commitment to daily physical activity for a minimum of 30 minutes discussed and encouraged, as a part of hypertension management. The importance of attaining a healthy weight is also discussed.  BP/Weight 11/02/2014 10/01/2014 04/13/2014 03/30/2014 01/22/2014 12/23/2013 2/68/3419  Systolic BP 622 297 989 211 941 740 814  Diastolic BP 68 58 66 68 70 60 86  Wt. (Lbs) 161 159 159 163 165 165 165  BMI 27.62 27.28 27.28 27.97 28.31 28.31 28.31        Superficial phlebitis and thrombophlebitis of both lower extremities Increased pain , distension and swelling of superficial veins in both lower extremities, refer for vascular evaluation patient request  BACK PAIN WITH RADICULOPATHY Uncontrolled.Toradol and depo medrol administered IM in the office , to be followed by a short course of oral prednisone and NSAIDS.   Hyperlipemia Hyperlipidemia:Low fat diet discussed and encouraged.   Lipid Panel  Lab Results  Component Value Date   CHOL 182  08/17/2014   HDL 51 08/17/2014   LDLCALC 101* 08/17/2014   LDLDIRECT 141* 10/31/2007   TRIG 152* 08/17/2014   CHOLHDL 3.6 08/17/2014  no med change      Impaired fasting glucose Patient educated about the importance of limiting  Carbohydrate intake , the need to commit to daily physical activity for a minimum of 30 minutes , and to commit  weight loss. The fact that changes in all these areas will reduce or eliminate all together the development of diabetes is stressed.  Deteriorated  Diabetic Labs Latest Ref Rng 08/17/2014 03/25/2014 02/25/2014 09/23/2013 03/12/2013  HbA1c <5.7 % 6.3(H) - 6.2(H) 6.4(H) 6.3(H)  Chol 0 - 200 mg/dL 182 - 187 160 184  HDL >=46 mg/dL 51 - 58 53 55  Calc LDL 0 - 99 mg/dL 101(H) - 106(H) 87 106(H)  Triglycerides <150 mg/dL 152(H) - 113 98 116  Creatinine 0.50 - 1.10 mg/dL 1.04 1.20(H) CANCELED 0.77 0.82   BP/Weight 11/02/2014 10/01/2014 04/13/2014 03/30/2014 01/22/2014 12/23/2013 2/62/0355  Systolic BP 974 163 845 364 680 321 224  Diastolic BP 68 58 66 68 70 60 86  Wt. (Lbs) 161 159 159 163 165 165 165  BMI 27.62 27.28 27.28 27.97 28.31 28.31 28.31   No flowsheet data found.     Generalized pain Uncontrolled.Toradol and depo medrol administered IM in the office , to be followed by a short course of oral prednisone and NSAIDS.   Myalgia and myositis current flare , anti inflammatories in office  Need for prophylactic vaccination and inoculation against influenza After obtaining informed consent, the vaccine is  administered by LPN.        Review of Systems     Objective:   Physical Exam        Assessment & Plan:

## 2014-11-02 NOTE — Assessment & Plan Note (Signed)
Chronic right chest pain, cough productive of yellow sputum x 1 week, intermittent chills

## 2014-11-03 ENCOUNTER — Ambulatory Visit (HOSPITAL_COMMUNITY)
Admission: RE | Admit: 2014-11-03 | Discharge: 2014-11-03 | Disposition: A | Discharge status: Home / Self Care | Payer: Medicare Other | Source: Ambulatory Visit | Attending: Family Medicine | Admitting: Family Medicine

## 2014-11-03 DIAGNOSIS — R079 Chest pain, unspecified: Secondary | ICD-10-CM | POA: Diagnosis present

## 2014-11-03 DIAGNOSIS — R0602 Shortness of breath: Secondary | ICD-10-CM | POA: Diagnosis not present

## 2014-11-03 DIAGNOSIS — R05 Cough: Secondary | ICD-10-CM

## 2014-11-03 DIAGNOSIS — R509 Fever, unspecified: Secondary | ICD-10-CM | POA: Diagnosis not present

## 2014-11-03 DIAGNOSIS — R6883 Chills (without fever): Secondary | ICD-10-CM

## 2014-11-03 DIAGNOSIS — Z23 Encounter for immunization: Secondary | ICD-10-CM | POA: Insufficient documentation

## 2014-11-03 DIAGNOSIS — R058 Other specified cough: Secondary | ICD-10-CM

## 2014-11-03 NOTE — Assessment & Plan Note (Signed)
Uncontrolled.Toradol and depo medrol administered IM in the office , to be followed by a short course of oral prednisone and NSAIDS.  

## 2014-11-03 NOTE — Assessment & Plan Note (Signed)
Controlled, no change in medication DASH diet and commitment to daily physical activity for a minimum of 30 minutes discussed and encouraged, as a part of hypertension management. The importance of attaining a healthy weight is also discussed.  BP/Weight 11/02/2014 10/01/2014 04/13/2014 03/30/2014 01/22/2014 12/23/2013 06/06/3843  Systolic BP 364 680 321 224 825 003 704  Diastolic BP 68 58 66 68 70 60 86  Wt. (Lbs) 161 159 159 163 165 165 165  BMI 27.62 27.28 27.28 27.97 28.31 28.31 28.31

## 2014-11-03 NOTE — Assessment & Plan Note (Signed)
After obtaining informed consent, the vaccine is  administered by LPN.  

## 2014-11-03 NOTE — Assessment & Plan Note (Signed)
current flare , anti inflammatories in office

## 2014-11-03 NOTE — Assessment & Plan Note (Signed)
Increased pain , distension and swelling of superficial veins in both lower extremities, refer for vascular evaluation patient request

## 2014-11-03 NOTE — Assessment & Plan Note (Signed)
Patient educated about the importance of limiting  Carbohydrate intake , the need to commit to daily physical activity for a minimum of 30 minutes , and to commit weight loss. The fact that changes in all these areas will reduce or eliminate all together the development of diabetes is stressed.  Deteriorated  Diabetic Labs Latest Ref Rng 08/17/2014 03/25/2014 02/25/2014 09/23/2013 03/12/2013  HbA1c <5.7 % 6.3(H) - 6.2(H) 6.4(H) 6.3(H)  Chol 0 - 200 mg/dL 182 - 187 160 184  HDL >=46 mg/dL 51 - 58 53 55  Calc LDL 0 - 99 mg/dL 101(H) - 106(H) 87 106(H)  Triglycerides <150 mg/dL 152(H) - 113 98 116  Creatinine 0.50 - 1.10 mg/dL 1.04 1.20(H) CANCELED 0.77 0.82   BP/Weight 11/02/2014 10/01/2014 04/13/2014 03/30/2014 01/22/2014 12/23/2013 0/24/0973  Systolic BP 532 992 426 834 196 222 979  Diastolic BP 68 58 66 68 70 60 86  Wt. (Lbs) 161 159 159 163 165 165 165  BMI 27.62 27.28 27.28 27.97 28.31 28.31 28.31   No flowsheet data found.

## 2014-11-03 NOTE — Assessment & Plan Note (Signed)
Hyperlipidemia:Low fat diet discussed and encouraged.   Lipid Panel  Lab Results  Component Value Date   CHOL 182 08/17/2014   HDL 51 08/17/2014   LDLCALC 101* 08/17/2014   LDLDIRECT 141* 10/31/2007   TRIG 152* 08/17/2014   CHOLHDL 3.6 08/17/2014  no med change

## 2014-11-17 ENCOUNTER — Other Ambulatory Visit: Payer: Self-pay

## 2014-11-17 DIAGNOSIS — M7989 Other specified soft tissue disorders: Secondary | ICD-10-CM

## 2014-11-17 DIAGNOSIS — M79604 Pain in right leg: Secondary | ICD-10-CM

## 2014-11-17 DIAGNOSIS — M79605 Pain in left leg: Secondary | ICD-10-CM

## 2014-11-17 DIAGNOSIS — I8003 Phlebitis and thrombophlebitis of superficial vessels of lower extremities, bilateral: Secondary | ICD-10-CM

## 2014-11-18 ENCOUNTER — Other Ambulatory Visit: Payer: Self-pay | Admitting: Family Medicine

## 2014-12-07 DIAGNOSIS — M19041 Primary osteoarthritis, right hand: Secondary | ICD-10-CM | POA: Diagnosis not present

## 2014-12-07 DIAGNOSIS — M170 Bilateral primary osteoarthritis of knee: Secondary | ICD-10-CM | POA: Diagnosis not present

## 2014-12-07 DIAGNOSIS — G4709 Other insomnia: Secondary | ICD-10-CM | POA: Diagnosis not present

## 2014-12-07 DIAGNOSIS — M797 Fibromyalgia: Secondary | ICD-10-CM | POA: Diagnosis not present

## 2014-12-08 DIAGNOSIS — Z961 Presence of intraocular lens: Secondary | ICD-10-CM | POA: Diagnosis not present

## 2014-12-12 ENCOUNTER — Other Ambulatory Visit: Payer: Self-pay | Admitting: Family Medicine

## 2014-12-15 ENCOUNTER — Encounter: Payer: Self-pay | Admitting: Vascular Surgery

## 2014-12-17 ENCOUNTER — Ambulatory Visit (INDEPENDENT_AMBULATORY_CARE_PROVIDER_SITE_OTHER): Payer: Medicare Other | Admitting: Vascular Surgery

## 2014-12-17 ENCOUNTER — Other Ambulatory Visit: Payer: Self-pay | Admitting: Vascular Surgery

## 2014-12-17 ENCOUNTER — Encounter: Payer: Self-pay | Admitting: Vascular Surgery

## 2014-12-17 ENCOUNTER — Ambulatory Visit (HOSPITAL_COMMUNITY)
Admission: RE | Admit: 2014-12-17 | Discharge: 2014-12-17 | Disposition: A | Discharge status: Home / Self Care | Payer: Medicare Other | Source: Ambulatory Visit | Attending: Vascular Surgery | Admitting: Vascular Surgery

## 2014-12-17 VITALS — BP 138/62 | HR 63 | Ht 64.0 in | Wt 157.0 lb

## 2014-12-17 DIAGNOSIS — M79605 Pain in left leg: Secondary | ICD-10-CM

## 2014-12-17 DIAGNOSIS — I8003 Phlebitis and thrombophlebitis of superficial vessels of lower extremities, bilateral: Secondary | ICD-10-CM | POA: Insufficient documentation

## 2014-12-17 DIAGNOSIS — I2510 Atherosclerotic heart disease of native coronary artery without angina pectoris: Secondary | ICD-10-CM

## 2014-12-17 DIAGNOSIS — I83813 Varicose veins of bilateral lower extremities with pain: Secondary | ICD-10-CM

## 2014-12-17 DIAGNOSIS — R609 Edema, unspecified: Secondary | ICD-10-CM

## 2014-12-17 DIAGNOSIS — M79604 Pain in right leg: Secondary | ICD-10-CM
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
## 2014-12-26 ENCOUNTER — Other Ambulatory Visit: Payer: Self-pay | Admitting: Family Medicine

## 2015-01-13 ENCOUNTER — Other Ambulatory Visit: Payer: Self-pay | Admitting: Family Medicine

## 2015-01-20 ENCOUNTER — Other Ambulatory Visit: Payer: Self-pay

## 2015-01-20 ENCOUNTER — Ambulatory Visit (INDEPENDENT_AMBULATORY_CARE_PROVIDER_SITE_OTHER): Payer: Medicare Other | Admitting: Nurse Practitioner

## 2015-01-20 ENCOUNTER — Ambulatory Visit (HOSPITAL_COMMUNITY)
Admission: RE | Admit: 2015-01-20 | Discharge: 2015-01-20 | Disposition: A | Discharge status: Home / Self Care | Payer: Medicare Other | Source: Ambulatory Visit | Attending: Nurse Practitioner | Admitting: Nurse Practitioner

## 2015-01-20 ENCOUNTER — Encounter: Payer: Self-pay | Admitting: Nurse Practitioner

## 2015-01-20 VITALS — BP 150/82 | HR 96 | Temp 97.6°F | Ht 64.0 in | Wt 156.4 lb

## 2015-01-20 DIAGNOSIS — R110 Nausea: Secondary | ICD-10-CM | POA: Diagnosis not present

## 2015-01-20 DIAGNOSIS — R1084 Generalized abdominal pain: Secondary | ICD-10-CM

## 2015-01-20 DIAGNOSIS — K219 Gastro-esophageal reflux disease without esophagitis: Secondary | ICD-10-CM

## 2015-01-20 DIAGNOSIS — K5900 Constipation, unspecified: Secondary | ICD-10-CM

## 2015-01-20 DIAGNOSIS — R109 Unspecified abdominal pain: Secondary | ICD-10-CM | POA: Diagnosis not present

## 2015-01-20 DIAGNOSIS — I2510 Atherosclerotic heart disease of native coronary artery without angina pectoris: Secondary | ICD-10-CM | POA: Diagnosis not present

## 2015-01-20 DIAGNOSIS — K5909 Other constipation: Secondary | ICD-10-CM | POA: Diagnosis not present

## 2015-01-20 NOTE — Assessment & Plan Note (Signed)
Patient with generalized abdominal pain which on exam his noted primarily in the epigastric and lower abdominal areas. History of GERD and chronic constipation as discussed above. Treatments for these also discussed above. It is possible her abdominal pain is due to GERD and worsening constipation. Today I'll also order CBC, CMP, lipase in addition to the abdominal x-ray to assess for stool burden. Starting her on Linzess 145 g daily as well as increasing her Prilosec twice daily for 4 weeks. Return for follow-up in 4 weeks. Notify us if any worsening or severe symptoms before then.

## 2015-01-20 NOTE — Patient Instructions (Signed)
1. Have your labs drawn when you're able to. 2. Have your x-ray taken when you're able to. 3. Increase her Prilosec to twice daily, 30 minutes before meal. Do this for 4 weeks and then we can return back to once daily. 4. Stop taking MiraLAX. 5. We will give you samples of Linzess 145 g pills. Take 1 a day and call me in 2 weeks to let us know if it is working better for you. 6. Side effects of Linzess can include diarrhea. This has been shown to typically resolve in 1-2 weeks. If you have diarrhea, try and get through it. If it is intolerable, let me know and we can try something else. 7. If it works well we can send in a prescription. 8. Return for follow-up in 4 weeks.
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
## 2015-01-20 NOTE — Assessment & Plan Note (Signed)
Patient has a history of GERD symptoms and has been on Prilosec once daily. She has been under a lot of stress recently with the passing of her Brother. Has had worsening GERD symptoms over the past several days and abdominal pain as noted below for the past week. At this point we'll increase her Prilosec to twice daily for 4 weeks. Return for follow-up in 4 weeks.

## 2015-01-20 NOTE — Assessment & Plan Note (Signed)
Patient with a long-standing history of chronic constipation. Currently she generally controls her constipation with MiraLAX, although this does not always work. She is recently had worsening constipation with hard stools which require straining to have a bowel movement. When discussed she said she would really like to try something else for better bowel control. Is possible her abdominal pain is discussed below is related to both her GERD symptoms and exacerbation of constipation given her recent increase in stress. Today I'll check an abdominal x-ray to evaluate for stool burden. We'll have her stop MiraLAX, start Linzess 145 g daily with samples for 2 weeks. She is to call us in 2 weeks with the results. Return for follow-up in 4 weeks.

## 2015-01-21 LAB — CBC WITH DIFFERENTIAL/PLATELET
Basophils Absolute: 0 10*3/uL (ref 0.0–0.1)
Basophils Relative: 0 % (ref 0–1)
Eosinophils Absolute: 0 10*3/uL (ref 0.0–0.7)
Eosinophils Relative: 0 % (ref 0–5)
HCT: 40.5 % (ref 36.0–46.0)
Hemoglobin: 13.6 g/dL (ref 12.0–15.0)
Lymphocytes Relative: 33 % (ref 12–46)
Lymphs Abs: 2.7 10*3/uL (ref 0.7–4.0)
MCH: 30.7 pg (ref 26.0–34.0)
MCHC: 33.6 g/dL (ref 30.0–36.0)
MCV: 91.4 fL (ref 78.0–100.0)
MPV: 9.4 fL (ref 8.6–12.4)
Monocytes Absolute: 0.9 10*3/uL (ref 0.1–1.0)
Monocytes Relative: 11 % (ref 3–12)
Neutro Abs: 4.6 10*3/uL (ref 1.7–7.7)
Neutrophils Relative %: 56 % (ref 43–77)
Platelets: 286 10*3/uL (ref 150–400)
RBC: 4.43 MIL/uL (ref 3.87–5.11)
RDW: 13.4 % (ref 11.5–15.5)
WBC: 8.3 10*3/uL (ref 4.0–10.5)

## 2015-01-21 LAB — COMPREHENSIVE METABOLIC PANEL
ALT: 21 U/L (ref 6–29)
AST: 22 U/L (ref 10–35)
Albumin: 4.3 g/dL (ref 3.6–5.1)
Alkaline Phosphatase: 40 U/L (ref 33–130)
BUN: 16 mg/dL (ref 7–25)
CO2: 26 mmol/L (ref 20–31)
Calcium: 9.6 mg/dL (ref 8.6–10.4)
Chloride: 106 mmol/L (ref 98–110)
Creat: 0.75 mg/dL (ref 0.60–0.93)
Glucose, Bld: 94 mg/dL (ref 65–99)
Potassium: 3.5 mmol/L (ref 3.5–5.3)
Sodium: 140 mmol/L (ref 135–146)
Total Bilirubin: 0.4 mg/dL (ref 0.2–1.2)
Total Protein: 7.2 g/dL (ref 6.1–8.1)

## 2015-01-21 LAB — LIPASE: Lipase: 10 U/L (ref 7–60)

## 2015-01-21 NOTE — Progress Notes (Signed)
CC'D TO PCP °

## 2015-01-25 ENCOUNTER — Telehealth: Payer: Self-pay

## 2015-01-25 NOTE — Telephone Encounter (Signed)
Pt is calling about blood work results and xray. She is also wanting to know if she can have something for nausea called into the [facility] in [place]. Please advise

## 2015-01-26 ENCOUNTER — Other Ambulatory Visit: Payer: Self-pay | Admitting: Nurse Practitioner

## 2015-01-26 ENCOUNTER — Other Ambulatory Visit: Payer: Self-pay | Admitting: Family Medicine

## 2015-01-26 DIAGNOSIS — R110 Nausea: Secondary | ICD-10-CM

## 2015-01-26 MED ORDER — ONDANSETRON 4 MG PO TBDP: 4.0000 mg | ORAL_TABLET | Freq: Three times a day (TID) | ORAL | Status: DC | PRN

## 2015-01-26 NOTE — Telephone Encounter (Signed)
See results notes for results. Essentially, XRay confirms constipation and labs are normal. Continue Linzess and plan for follow-up. Call with any problems. I will send in Zofran to her pharmacy for nausea as requested.

## 2015-01-26 NOTE — Progress Notes (Signed)
Quick Note:  PT is aware. She has not had a BM since starting the Linzess on 01/20/2015. She is not in pain, but uncomfortable. I spoke to Laban Emperor, NP in [place] absence.m She said to tell the pt to take a dose of Miralax this afternoon and tomorrow morning she can take the [place] with food and see if that helps. Pt is aware and will let us know if she has any more problems. ______

## 2015-01-26 NOTE — Telephone Encounter (Signed)
Pt is aware.  

## 2015-01-26 NOTE — Progress Notes (Signed)
Quick Note:  PT is aware of results. ______ 

## 2015-02-01 ENCOUNTER — Other Ambulatory Visit: Payer: Self-pay | Admitting: Family Medicine

## 2015-02-08 ENCOUNTER — Ambulatory Visit (INDEPENDENT_AMBULATORY_CARE_PROVIDER_SITE_OTHER): Payer: Medicare Other | Admitting: Nurse Practitioner

## 2015-02-08 ENCOUNTER — Encounter: Payer: Self-pay | Admitting: Nurse Practitioner

## 2015-02-08 VITALS — BP 148/79 | HR 94 | Temp 97.2°F | Ht 64.0 in | Wt 155.4 lb

## 2015-02-08 DIAGNOSIS — I2510 Atherosclerotic heart disease of native coronary artery without angina pectoris: Secondary | ICD-10-CM | POA: Diagnosis not present

## 2015-02-08 DIAGNOSIS — K5900 Constipation, unspecified: Secondary | ICD-10-CM

## 2015-02-08 DIAGNOSIS — K219 Gastro-esophageal reflux disease without esophagitis: Secondary | ICD-10-CM

## 2015-02-08 MED ORDER — OMEPRAZOLE 20 MG PO CPDR: 20.0000 mg | DELAYED_RELEASE_CAPSULE | Freq: Two times a day (BID) | ORAL | Status: DC

## 2015-02-08 MED ORDER — POLYETHYLENE GLYCOL 3350 17 GM/SCOOP PO POWD: 1.0000 | Freq: Every day | ORAL | Status: DC

## 2015-02-08 NOTE — Patient Instructions (Addendum)
1. Stop Linzess 2. Take a Miralax Purge: Take 17 gm (1 capful or 1 packet) of Miralax mixed into water, Gatorade, or Powerade followed by 8 ounces of plain water. Do this once an hour until you have a good bowel movement, but no more than 5 doses. Take Miralax once daily to prevent constipation. You may add a second dose in the evening on days needed for stubborn constipation. Continue Prilosec twice daily for 4 more weeks. I sent in a refill to your pharmacy. Return for follow-up in 4 weeks. Call us if you have any problems.

## 2015-02-08 NOTE — Assessment & Plan Note (Signed)
GERD is improved with twice a day PPI dosing given recent stressful situation with the passing of her brother. We'll continue this for 4 more weeks and attempt to dial back to once a day dosing. Return for follow-up in 4 weeks.

## 2015-02-08 NOTE — Progress Notes (Signed)
CC'ED TO PCP 

## 2015-02-08 NOTE — Assessment & Plan Note (Signed)
Constipation is about the same that it was at last visit. Linzess has not helped her. She prefers to go back to [place] which she feels worked the [employer] of the time. States she has not had a bowel movement in 4 weeks since starting Linzess and stopping MiraLAX. I will give her instructions for MiraLAX purge in order for her to have a good initial bowel movement given her complaints of crampy abdominal pain and bloating. At that point I'll have her return to [facility] once a day with instructions to take a second dose in the evening on days that once a day dosing does not seem to help her. Return for follow-up in 4 weeks.
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
## 2015-02-10 ENCOUNTER — Other Ambulatory Visit: Payer: Self-pay | Admitting: Family Medicine

## 2015-02-27 ENCOUNTER — Other Ambulatory Visit: Payer: Self-pay | Admitting: Family Medicine

## 2015-03-06 ENCOUNTER — Other Ambulatory Visit: Payer: Self-pay | Admitting: Family Medicine

## 2015-03-08 ENCOUNTER — Ambulatory Visit (INDEPENDENT_AMBULATORY_CARE_PROVIDER_SITE_OTHER): Payer: Medicare Other | Admitting: Nurse Practitioner

## 2015-03-08 ENCOUNTER — Encounter: Payer: Self-pay | Admitting: Nurse Practitioner

## 2015-03-08 VITALS — BP 141/76 | HR 68 | Temp 97.3°F | Ht 64.0 in | Wt 158.8 lb

## 2015-03-08 DIAGNOSIS — K219 Gastro-esophageal reflux disease without esophagitis: Secondary | ICD-10-CM

## 2015-03-08 DIAGNOSIS — K5900 Constipation, unspecified: Secondary | ICD-10-CM | POA: Diagnosis not present

## 2015-03-08 MED ORDER — POLYETHYLENE GLYCOL 3350 17 GM/SCOOP PO POWD: 1.0000 | Freq: Two times a day (BID) | ORAL | Status: DC | PRN

## 2015-03-08 MED ORDER — OMEPRAZOLE 20 MG PO CPDR: 20.0000 mg | DELAYED_RELEASE_CAPSULE | Freq: Every day | ORAL | Status: DC

## 2015-03-08 NOTE — Assessment & Plan Note (Signed)
Vision with continued constipation although somewhat improved. MiraLAX purge worked well for her. She feels that once a day MiraLAX is not working well enough. She admittedly drinks not enough water. Have counseled her increase her water intake including the use of a water bottle to gauge how much water she has drank her in the day. He can also take MiraLAX twice a day as needed for constipation despite adequate water intake. Return for follow-up in 3 months to reassess symptoms.

## 2015-03-08 NOTE — Progress Notes (Signed)
cc'ed to pcp °
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
## 2015-03-08 NOTE — Patient Instructions (Addendum)
1. Reduce your acid blocker (Prilosec) to once a day. 2. As we discussed, increase the amount of water in your diet. You can do this by obtaining a larger water bottle (approximately 32 ounces) and set a goal to drink 1 container full a day, sipping slowly throughout the day. 3. You can take MiraLAX twice a day as needed for constipation. As you improve your water intake you may find that you can cut back on the MiraLAX. 4. Return for follow-up in 3 months

## 2015-03-08 NOTE — Assessment & Plan Note (Signed)
GERD symptoms well controlled on twice a day PPI. As discussed in previous office visit will decrease her PPI back to once a day now that she is "over the hump " from the increased stress with the passing of her relative. Return for follow-up in 3 months to assess symptoms on once a day PPI.

## 2015-03-12 ENCOUNTER — Encounter: Payer: Self-pay | Admitting: Vascular Surgery

## 2015-03-15 ENCOUNTER — Encounter: Payer: Self-pay | Admitting: Vascular Surgery

## 2015-03-22 DIAGNOSIS — R7309 Other abnormal glucose: Secondary | ICD-10-CM | POA: Diagnosis not present

## 2015-03-22 DIAGNOSIS — I10 Essential (primary) hypertension: Secondary | ICD-10-CM | POA: Diagnosis not present

## 2015-03-22 DIAGNOSIS — E785 Hyperlipidemia, unspecified: Secondary | ICD-10-CM | POA: Diagnosis not present

## 2015-03-22 LAB — CBC
HCT: 40.7 % (ref 36.0–46.0)
Hemoglobin: 13.9 g/dL (ref 12.0–15.0)
MCH: 30.6 pg (ref 26.0–34.0)
MCHC: 34.2 g/dL (ref 30.0–36.0)
MCV: 89.6 fL (ref 78.0–100.0)
MPV: 9.1 fL (ref 8.6–12.4)
Platelets: 339 10*3/uL (ref 150–400)
RBC: 4.54 MIL/uL (ref 3.87–5.11)
RDW: 14 % (ref 11.5–15.5)
WBC: 8.1 10*3/uL (ref 4.0–10.5)

## 2015-03-22 LAB — HEMOGLOBIN A1C
Hgb A1c MFr Bld: 6.1 % — ABNORMAL HIGH (ref <5.7)
Mean Plasma Glucose: 128 mg/dL — ABNORMAL HIGH (ref <117)

## 2015-03-23 ENCOUNTER — Ambulatory Visit (INDEPENDENT_AMBULATORY_CARE_PROVIDER_SITE_OTHER): Payer: Medicare Other | Admitting: Vascular Surgery

## 2015-03-23 ENCOUNTER — Encounter: Payer: Self-pay | Admitting: Vascular Surgery

## 2015-03-23 VITALS — BP 107/68 | HR 70 | Temp 97.3°F | Resp 16 | Ht 63.5 in | Wt 160.1 lb

## 2015-03-23 DIAGNOSIS — I83813 Varicose veins of bilateral lower extremities with pain: Secondary | ICD-10-CM

## 2015-03-23 LAB — COMPLETE METABOLIC PANEL WITH GFR
ALT: 19 U/L (ref 6–29)
AST: 20 U/L (ref 10–35)
Albumin: 4.2 g/dL (ref 3.6–5.1)
Alkaline Phosphatase: 42 U/L (ref 33–130)
BUN: 22 mg/dL (ref 7–25)
CO2: 24 mmol/L (ref 20–31)
Calcium: 9.4 mg/dL (ref 8.6–10.4)
Chloride: 109 mmol/L (ref 98–110)
Creat: 0.87 mg/dL (ref 0.60–0.93)
GFR, Est African American: 75 mL/min (ref >=60)
GFR, Est Non African American: 65 mL/min (ref >=60)
Glucose, Bld: 109 mg/dL — ABNORMAL HIGH (ref 65–99)
Potassium: 3.7 mmol/L (ref 3.5–5.3)
Sodium: 143 mmol/L (ref 135–146)
Total Bilirubin: 0.6 mg/dL (ref 0.2–1.2)
Total Protein: 7 g/dL (ref 6.1–8.1)

## 2015-03-23 LAB — TSH: TSH: 2.608 u[IU]/mL (ref 0.350–4.500)

## 2015-03-23 LAB — LIPID PANEL
Cholesterol: 175 mg/dL (ref 125–200)
HDL: 49 mg/dL (ref >=46)
LDL Cholesterol: 96 mg/dL (ref <130)
Total CHOL/HDL Ratio: 3.6 Ratio (ref <=5.0)
Triglycerides: 151 mg/dL — ABNORMAL HIGH (ref <150)
VLDL: 30 mg/dL (ref <30)

## 2015-03-23 NOTE — Progress Notes (Signed)
Problems with Activities of Daily Living Secondary to Leg Pain  1. Mrs. Rubach states that activities that require prolonged standing (cooking, cleaning, shopping) are very difficult due to leg pain and swelling.  2. Mrs. Saraceno states that driving is difficult for her due to leg pain and swelling.     Failure of  Conservative Therapy:  1. Worn 20-30 mm Hg thigh high compression hose >3 months with no relief of symptoms.  2. Frequently elevates legs-no relief of symptoms  3. Taken Ibuprofen 600 Mg TID with no relief of symptoms.  The patient presents today for continued discussion regarding her lower extremity venous pathology. She reports that she has had some mild improvement in the swelling around the level of her ankle with her compression garment. Still has some aching at the end of the day. No history of DVT.  On physical exam she has a 2-3+ dorsalis pedis pulses bilaterally. She has multiple large telangiectasia over both lower extremity is. No varicosities. No changes of chronic venous stasis disease.  I reviewed her duplex from 12/17/2014. Discussed this with the patient. 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 Did have some reflux in her left great and small saphenous vein. The diameter was small.  I had long discussion with the patient regarding this. I explained that her symptoms seem to be related to deep venous reflux. I do not feel that she would achieve any benefit from ablation of her left great or small saphenous vein since this is a minimal component of her deep venous reflux. She was reassured with discussion. Explained there is nothing dangerous related to her findings. She will see [place] again on as-needed basis

## 2015-03-26 ENCOUNTER — Other Ambulatory Visit (HOSPITAL_COMMUNITY): Payer: Self-pay | Admitting: Pulmonary Disease

## 2015-03-26 ENCOUNTER — Ambulatory Visit (INDEPENDENT_AMBULATORY_CARE_PROVIDER_SITE_OTHER): Payer: Medicare Other | Admitting: Family Medicine

## 2015-03-26 ENCOUNTER — Ambulatory Visit (HOSPITAL_COMMUNITY)
Admission: RE | Admit: 2015-03-26 | Discharge: 2015-03-26 | Disposition: A | Discharge status: Home / Self Care | Payer: Medicare Other | Source: Ambulatory Visit | Attending: Pulmonary Disease | Admitting: Pulmonary Disease

## 2015-03-26 ENCOUNTER — Encounter: Payer: Self-pay | Admitting: Family Medicine

## 2015-03-26 VITALS — BP 110/62 | HR 66 | Resp 16 | Ht 63.0 in | Wt 158.0 lb

## 2015-03-26 DIAGNOSIS — E785 Hyperlipidemia, unspecified: Secondary | ICD-10-CM | POA: Diagnosis not present

## 2015-03-26 DIAGNOSIS — M810 Age-related osteoporosis without current pathological fracture: Secondary | ICD-10-CM

## 2015-03-26 DIAGNOSIS — R52 Pain, unspecified: Secondary | ICD-10-CM

## 2015-03-26 DIAGNOSIS — R05 Cough: Secondary | ICD-10-CM | POA: Diagnosis not present

## 2015-03-26 DIAGNOSIS — Z9181 History of falling: Secondary | ICD-10-CM

## 2015-03-26 DIAGNOSIS — R7301 Impaired fasting glucose: Secondary | ICD-10-CM

## 2015-03-26 DIAGNOSIS — R296 Repeated falls: Secondary | ICD-10-CM

## 2015-03-26 DIAGNOSIS — J40 Bronchitis, not specified as acute or chronic: Secondary | ICD-10-CM | POA: Diagnosis not present

## 2015-03-26 DIAGNOSIS — Z1231 Encounter for screening mammogram for malignant neoplasm of breast: Secondary | ICD-10-CM

## 2015-03-26 DIAGNOSIS — Z0000 Encounter for general adult medical examination without abnormal findings: Secondary | ICD-10-CM | POA: Diagnosis not present

## 2015-03-26 DIAGNOSIS — R229 Localized swelling, mass and lump, unspecified: Secondary | ICD-10-CM

## 2015-03-26 DIAGNOSIS — R222 Localized swelling, mass and lump, trunk: Secondary | ICD-10-CM

## 2015-03-26 DIAGNOSIS — I10 Essential (primary) hypertension: Secondary | ICD-10-CM | POA: Diagnosis not present

## 2015-03-26 HISTORY — DX: Encounter for general adult medical examination without abnormal findings: Z00.00

## 2015-03-26 NOTE — Progress Notes (Signed)
Subjective:    Patient ID: Andrea Santiago, female    DOB: 01-01-1941, 75 y.o.   MRN: [id number]  HPI Preventive Screening-Counseling & Management   Patient present here today for a Medicare annual wellness visit. C/o increased left hip pain from a swelling on her left buttock, shoots down post thigh and she attribiutes this to recurrent falls/ near falls Needs PT because of pain and muscle aces and stiffness from fibromyalgia, missed referral from her rheumatologist due ot her brother's passing in the fall   Current Problems (verified)   Medications Prior to Visit Allergies (verified)   PAST HISTORY  Family History (verified)   Social History Divorced, 2 sons, retired from [employer], never smoker or used drugs    Risk Factors  Current exercise habits:  Uses exercise machine for 20 minutes 3 times a week   Dietary issues discussed: heart healthy diet discussed, eating more fruits and vegetables and limiting fried fatty foods    Cardiac risk factors: CAD   Depression Screen  (Note: if answer to either of the following is "Yes", a more complete depression screening is indicated)   Over the past two weeks, have you felt down, depressed or hopeless? No  Over the past two weeks, have you felt little interest or pleasure in doing things? No  Have you lost interest or pleasure in daily life? No  Do you often feel hopeless? No  Do you cry easily over simple problems? No   Activities of Daily Living  In your present state of health, do you have any difficulty performing the following activities?  Driving?: No Managing money?: No Feeding yourself?:No Getting from bed to chair?:No Climbing a flight of stairs?:No, but descending feels a bit unbalanced Preparing food and eating?:No Bathing or showering?:No Getting dressed?:No Getting to the toilet?:No Using the toilet?:No Moving around from place to place?: yes , at times,in past 3 months due to painful swelling on left  buttock Fall Risk Assessment In the past year have you fallen or had a near fall?:No Are you currently taking any medications that make you dizzy:No   Hearing Difficulties: No Do you often ask people to speak up or repeat themselves?:No Do you experience ringing or noises in your ears?:No Do you have difficulty understanding soft or whispered voices?:No  Cognitive Testing  Alert? Yes Normal Appearance?Yes  Oriented to person? Yes Place? Yes  Time? Yes  Displays appropriate judgment?Yes  Can read the correct time from a watch face? yes Are you having problems remembering things?No  Advanced Directives have been discussed with the patient?Yes, will give brochure , she is a full code   List the Names of Other Physician/Practitioners you currently use:  Dr Kathaleen Maser (rheumatology) Dr Luan Pulling (pulmo)  Dr Bobette Mo (cardio)   Indicate any recent Medical Services you may have received from other than [facility] providers in the past year (date may be approximate).   Assessment:    Annual Wellness Exam   Plan:    Patient Instructions (the written plan) was given to the patient.  Medicare Attestation  I have personally reviewed:  The patient's medical and social history  Their use of alcohol, tobacco or illicit drugs  Their current medications and supplements  The patient's functional ability including ADLs,fall risks, home safety risks, cognitive, and hearing and visual impairment  Diet and physical activities  Evidence for depression or mood disorders  The patient's weight, height, BMI, and visual acuity have been recorded in the chart. I have  made referrals, counseling, and provided education to the patient based on review of the above and I have provided the patient with a written personalized care plan for preventive services.      Review of Systems     Objective:   Physical Exam BP 110/62 mmHg  Pulse 66  Resp 16  Ht 5\' 3"  (1.6 m)  Wt 158 lb (71.668 kg)  BMI 28.00  kg/m2  SpO2 99%  MS: decreased ROM of lumbar spine, hips and knees  Skin:  Painful nodule/ mass palpated under skin of left buttock, non mobile, no overlying erythema or warmth or tenderness      Assessment & Plan:  Medicare annual wellness visit, subsequent Annual exam as documented. Counseling done  re healthy lifestyle involving commitment to 150 minutes exercise per week, heart healthy diet, and attaining healthy weight.The importance of adequate sleep also discussed. Regular seat belt use and home safety, is also discussed. Changes in health habits are decided on by the patient with goals and time frames  set for achieving them. Immunization and cancer screening needs are specifically addressed at this visit. Need for living will documentation discussed  At high risk for falls H/o falls in past year also her musculoskeletal disease and fibromyalgia increase her fall risk. Refer to PT for strengthening, pain management and improved gait  Nodule of buttock Painful swelling under skin of lateral aspect of left buttock ,  Pain radiates down thigh and causes imbalance and increased fall risk, reportedly increasing in size, [place] to further evaluate

## 2015-03-26 NOTE — Assessment & Plan Note (Signed)
Annual exam as documented. Counseling done  re healthy lifestyle involving commitment to 150 minutes exercise per week, heart healthy diet, and attaining healthy weight.The importance of adequate sleep also discussed. Regular seat belt use and home safety, is also discussed. Changes in health habits are decided on by the patient with goals and time frames  set for achieving them. Immunization and cancer screening needs are specifically addressed at this visit. Need for living will documentation discussed

## 2015-03-26 NOTE — Patient Instructions (Addendum)
F/u with rectal in 6 month, call if you need me sooner  Mammogram in March please schedule  Dimple Casey re referred for bone density test, [place] of swelling and PT  Excellent labs congrats  Increase exercise to daily  Please work on living will  Thanks for choosing [facility], we consider it a privelige to serve you.  Fasting lipid, cmp and EGFR, hBa1C and tSH and Vit D in 6 month

## 2015-03-27 ENCOUNTER — Other Ambulatory Visit: Payer: Self-pay | Admitting: Family Medicine

## 2015-03-28 DIAGNOSIS — R222 Localized swelling, mass and lump, trunk: Secondary | ICD-10-CM | POA: Insufficient documentation

## 2015-03-28 DIAGNOSIS — Z9181 History of falling: Secondary | ICD-10-CM | POA: Insufficient documentation

## 2015-03-28 NOTE — Assessment & Plan Note (Signed)
Painful swelling under skin of lateral aspect of left buttock ,  Pain radiates down thigh and causes imbalance and increased fall risk, reportedly increasing in size, [place] to further evaluate

## 2015-03-28 NOTE — Assessment & Plan Note (Signed)
H/o falls in past year also her musculoskeletal disease and fibromyalgia increase her fall risk. Refer to PT for strengthening, pain management and improved gait

## 2015-03-29 ENCOUNTER — Other Ambulatory Visit: Payer: Self-pay

## 2015-03-29 DIAGNOSIS — Z780 Asymptomatic menopausal state: Secondary | ICD-10-CM

## 2015-04-05 ENCOUNTER — Encounter: Payer: Medicare Other | Admitting: Family Medicine

## 2015-04-06 ENCOUNTER — Ambulatory Visit (HOSPITAL_COMMUNITY)
Admission: RE | Admit: 2015-04-06 | Discharge: 2015-04-06 | Disposition: A | Discharge status: Home / Self Care | Payer: Medicare Other | Source: Ambulatory Visit | Attending: Family Medicine | Admitting: Family Medicine

## 2015-04-06 ENCOUNTER — Ambulatory Visit (HOSPITAL_COMMUNITY): Admission: RE | Admit: 2015-04-06 | Payer: Medicare Other | Source: Ambulatory Visit

## 2015-04-06 DIAGNOSIS — R222 Localized swelling, mass and lump, trunk: Secondary | ICD-10-CM

## 2015-04-06 DIAGNOSIS — R229 Localized swelling, mass and lump, unspecified: Secondary | ICD-10-CM | POA: Diagnosis not present

## 2015-04-06 NOTE — Addendum Note (Signed)
Addended by: Denman George B on: 04/06/2015 09:41 AM   Modules accepted: Orders

## 2015-04-14 ENCOUNTER — Telehealth: Payer: Self-pay

## 2015-04-14 DIAGNOSIS — R222 Localized swelling, mass and lump, trunk: Secondary | ICD-10-CM

## 2015-04-14 NOTE — Telephone Encounter (Signed)
-----   Message from Fayrene Helper, MD sent at 04/13/2015  6:24 AM EST ----- Pls let pt know that US shows likely cause of "mass " on buttock is from trauma, likely past injections. Since this is causing pain, I recommend a surgical eval to follow this clinically, if she agrees refer to Dr Wilmer Floor

## 2015-04-26 ENCOUNTER — Ambulatory Visit (HOSPITAL_COMMUNITY)
Admission: RE | Admit: 2015-04-26 | Discharge: 2015-04-26 | Disposition: A | Discharge status: Home / Self Care | Payer: Medicare Other | Source: Ambulatory Visit | Attending: Family Medicine | Admitting: Family Medicine

## 2015-04-26 ENCOUNTER — Other Ambulatory Visit: Payer: Self-pay | Admitting: Family Medicine

## 2015-04-26 ENCOUNTER — Other Ambulatory Visit (HOSPITAL_COMMUNITY): Payer: Medicare Other

## 2015-04-26 ENCOUNTER — Other Ambulatory Visit: Payer: Self-pay | Admitting: Nurse Practitioner

## 2015-04-26 DIAGNOSIS — Z780 Asymptomatic menopausal state: Secondary | ICD-10-CM | POA: Diagnosis not present

## 2015-04-26 DIAGNOSIS — M8580 Other specified disorders of bone density and structure, unspecified site: Secondary | ICD-10-CM | POA: Diagnosis not present

## 2015-04-26 DIAGNOSIS — Z1231 Encounter for screening mammogram for malignant neoplasm of breast: Secondary | ICD-10-CM | POA: Insufficient documentation

## 2015-04-26 DIAGNOSIS — M85852 Other specified disorders of bone density and structure, left thigh: Secondary | ICD-10-CM | POA: Diagnosis not present

## 2015-04-27 DIAGNOSIS — R229 Localized swelling, mass and lump, unspecified: Secondary | ICD-10-CM | POA: Diagnosis not present

## 2015-05-02 ENCOUNTER — Other Ambulatory Visit: Payer: Self-pay | Admitting: Family Medicine

## 2015-05-04 ENCOUNTER — Other Ambulatory Visit: Payer: Self-pay | Admitting: Nurse Practitioner

## 2015-05-05 ENCOUNTER — Encounter: Payer: Self-pay | Admitting: Family Medicine

## 2015-05-05 ENCOUNTER — Ambulatory Visit (HOSPITAL_COMMUNITY)
Admission: RE | Admit: 2015-05-05 | Discharge: 2015-05-05 | Disposition: A | Discharge status: Home / Self Care | Payer: Medicare Other | Source: Ambulatory Visit | Attending: Family Medicine | Admitting: Family Medicine

## 2015-05-05 ENCOUNTER — Ambulatory Visit (INDEPENDENT_AMBULATORY_CARE_PROVIDER_SITE_OTHER): Payer: Medicare Other | Admitting: Family Medicine

## 2015-05-05 VITALS — BP 130/72 | HR 88 | Resp 16 | Ht 63.0 in | Wt 161.1 lb

## 2015-05-05 DIAGNOSIS — M5432 Sciatica, left side: Secondary | ICD-10-CM | POA: Diagnosis not present

## 2015-05-05 DIAGNOSIS — R221 Localized swelling, mass and lump, neck: Secondary | ICD-10-CM | POA: Diagnosis not present

## 2015-05-05 DIAGNOSIS — R220 Localized swelling, mass and lump, head: Secondary | ICD-10-CM

## 2015-05-05 DIAGNOSIS — I10 Essential (primary) hypertension: Secondary | ICD-10-CM

## 2015-05-05 DIAGNOSIS — L03116 Cellulitis of left lower limb: Secondary | ICD-10-CM

## 2015-05-05 DIAGNOSIS — R229 Localized swelling, mass and lump, unspecified: Secondary | ICD-10-CM

## 2015-05-05 DIAGNOSIS — R222 Localized swelling, mass and lump, trunk: Secondary | ICD-10-CM

## 2015-05-05 MED ORDER — METHYLPREDNISOLONE ACETATE 80 MG/ML IJ SUSP
120.0000 mg | Freq: Once | INTRAMUSCULAR | Status: AC
  Administered 2015-05-05: 120 mg via INTRAMUSCULAR

## 2015-05-05 MED ORDER — PREDNISONE 5 MG (21) PO TBPK
5.0000 mg | ORAL_TABLET | ORAL | Status: DC
Start: 2015-05-05 — End: 2015-06-15

## 2015-05-05 MED ORDER — CEPHALEXIN 500 MG PO CAPS: 500.0000 mg | ORAL_CAPSULE | Freq: Three times a day (TID) | ORAL | Status: DC

## 2015-05-05 NOTE — Patient Instructions (Signed)
F/u as before  Call if you need me sooner  Xray of skull today to look at swelling behind right ear  Injection today in office followed by 6 day course of prednisone  You are referred for MRI of low back, then will be referred to Dr Rita Ohara for re evaluation  5 day course of antibiotic sent for redness and pain of left lower leg, stinging / burning is from sciatica  Thanks for choosing [facility], we consider it a privelige to serve you.

## 2015-05-05 NOTE — Progress Notes (Signed)
   Subjective:    Patient ID: Andrea Santiago, female    DOB: April 19, 1940, 75 y.o.   MRN: [id number]  HPI 2 month h/o sharp intermittent back pain rated at a 10, happens on average twice per week, making her feel as though she is about to fall, was told in the past needed back surgery, does not want this but wants re evaluation by neurosurgeon, also notes weekness in left leg, denies incontinence of stool or urine. Redness , swelling and stining behind left foot this past weekend, feels as though she had an insect bite C/o swelling behind left ear , which  has hearing loss     Review of Systems  See HPI Denies recent fever or chills. Denies sinus pressure, nasal congestion, ear pain or sore throat. Denies chest congestion, productive cough or wheezing. Denies chest pains, palpitations and leg swelling Denies abdominal pain, nausea, vomiting,diarrhea or constipation.   Denies dysuria, frequency, hesitancy or incontinence.  Denies headaches, seizures, numbness, or tingling. Denies depression, anxiety or insomnia.     Objective:   Physical Exam  BP 130/72 mmHg  Pulse 88  Resp 16  Ht 5\' 3"  (1.6 m)  Wt 161 lb 1.9 oz (73.084 kg)  BMI 28.55 kg/m2  SpO2 99%  Patient alert and oriented and in no cardiopulmonary distress.  HEENT: No facial asymmetry, EOMI,   oropharynx pink and moist.  Neck supple no JVD, no mass. Asymetry noted on exam of posterior auricular areas, no bony abnormality noted, right side larger than left  Chest: Clear to auscultation bilaterally.  CVS: S1, S2 no murmurs, no S3.Regular rate.  ABD: Soft non tender.   Ext: No edema  MS: Decreased  ROM spine, shoulders, hips and knees.  Skin: Intact, mild erythema and warmth noted on posterior aspect of right leg, max diameter approx .3.5 in, no purulent drainage, no puncture site noted  Psych: Good eye contact, normal affect. Memory intact not anxious or depressed appearing.  CNS: CN 2-12 intact, grade 4 power  in left lower extremity with muscle wasting noted in left lower extremity and reduced tone , otherwise normal .      Assessment & Plan:  Sciatica of left side Uncontroled, with 2 month h/o left lower ext weakness and near fall. MRI lumbar spine and neurosurgery re eval per pt request, despite the fact that she states does not want surgery, I explained that her weakness and near falls may warrant surgery Depomedrol 120 mg im in office and prednisone short course prescribed   Cellulitis of leg, left Possible insect bite during recent beach trip, mile lower extremity erythema and tenderness, short course of keflex prescribed, advised rt not to scratch area  Essential hypertension Controlled, no change in medication DASH diet and commitment to daily physical activity for a minimum of 30 minutes discussed and encouraged, as a part of hypertension management. The importance of attaining a healthy weight is also discussed.  BP/Weight 05/05/2015 03/26/2015 03/23/2015 03/08/2015 02/08/2015 01/20/2015 [id number]  Systolic BP [id number] [id number] [id number] 0000000  Diastolic BP 72 62 68 76 79 82 62  Wt. (Lbs) 161.12 158 160.1 158.8 155.4 156.4 157  BMI 28.55 28 27.91 27.24 26.66 26.83 26.94        Superficial swelling of scalp Pt concerned about asymetry, an possible mass behind right ear, on scalp. On exam , no bony abnormality palpable, mild asymetry noted, xray of scalp ordered

## 2015-05-05 NOTE — Assessment & Plan Note (Addendum)
Uncontroled, with 2 month h/o left lower ext weakness and near fall. MRI lumbar spine and neurosurgery re eval per pt request, despite the fact that she states does not want surgery, I explained that her weakness and near falls may warrant surgery Depomedrol 120 mg im in office and prednisone short course prescribed

## 2015-05-09 DIAGNOSIS — R220 Localized swelling, mass and lump, head: Secondary | ICD-10-CM | POA: Insufficient documentation

## 2015-05-09 NOTE — Assessment & Plan Note (Signed)
Pt concerned about asymetry, an possible mass behind right ear, on scalp. On exam , no bony abnormality palpable, mild asymetry noted, xray of scalp ordered

## 2015-05-09 NOTE — Assessment & Plan Note (Signed)
Possible insect bite during recent beach trip, mile lower extremity erythema and tenderness, short course of keflex prescribed, advised rt not to scratch area

## 2015-05-09 NOTE — Assessment & Plan Note (Signed)
Controlled, no change in medication DASH diet and commitment to daily physical activity for a minimum of 30 minutes discussed and encouraged, as a part of hypertension management. The importance of attaining a healthy weight is also discussed.  BP/Weight 05/05/2015 03/26/2015 03/23/2015 03/08/2015 02/08/2015 01/20/2015 [id number]  Systolic BP [id number] [id number] 123456 Q000111Q 0000000  Diastolic BP 72 62 68 76 79 82 62  Wt. (Lbs) 161.12 158 160.1 158.8 155.4 156.4 157  BMI 28.55 28 27.91 27.24 26.66 26.83 26.94

## 2015-05-10 ENCOUNTER — Other Ambulatory Visit: Payer: Self-pay | Admitting: Family Medicine

## 2015-05-14 ENCOUNTER — Ambulatory Visit (HOSPITAL_COMMUNITY)
Admission: RE | Admit: 2015-05-14 | Discharge: 2015-05-14 | Disposition: A | Discharge status: Home / Self Care | Payer: Medicare Other | Source: Ambulatory Visit | Attending: Family Medicine | Admitting: Family Medicine

## 2015-05-14 DIAGNOSIS — M5432 Sciatica, left side: Secondary | ICD-10-CM

## 2015-05-18 ENCOUNTER — Other Ambulatory Visit: Payer: Self-pay | Admitting: Family Medicine

## 2015-05-21 ENCOUNTER — Ambulatory Visit (HOSPITAL_COMMUNITY): Admission: RE | Admit: 2015-05-21 | Payer: Medicare Other | Source: Ambulatory Visit

## 2015-05-28 ENCOUNTER — Other Ambulatory Visit: Payer: Self-pay | Admitting: Family Medicine

## 2015-05-28 ENCOUNTER — Ambulatory Visit (HOSPITAL_COMMUNITY)
Admission: RE | Admit: 2015-05-28 | Discharge: 2015-05-28 | Disposition: A | Discharge status: Home / Self Care | Payer: Medicare Other | Source: Ambulatory Visit | Attending: Family Medicine | Admitting: Family Medicine

## 2015-05-28 DIAGNOSIS — M545 Low back pain: Secondary | ICD-10-CM | POA: Insufficient documentation

## 2015-05-28 DIAGNOSIS — M48061 Spinal stenosis, lumbar region without neurogenic claudication: Secondary | ICD-10-CM

## 2015-05-28 DIAGNOSIS — M5126 Other intervertebral disc displacement, lumbar region: Secondary | ICD-10-CM | POA: Diagnosis not present

## 2015-05-28 DIAGNOSIS — M47896 Other spondylosis, lumbar region: Secondary | ICD-10-CM | POA: Insufficient documentation

## 2015-05-28 DIAGNOSIS — M4806 Spinal stenosis, lumbar region: Secondary | ICD-10-CM | POA: Diagnosis not present

## 2015-05-29 IMAGING — US US SOFT TISSUE HEAD/NECK
1 series · 13 of 25 positions shown · non-contrast
Comparison: 03/25/2014

CLINICAL DATA: Multiple thyroid nodules by a recent chest CT.

EXAM:
THYROID ULTRASOUND
TECHNIQUE: Ultrasound examination of the thyroid gland and adjacent soft
tissues was performed.

[Series 1: us soft tissue head/neck · 0.05mm/px · 13 of 58 slices shown]
[im 1/58]
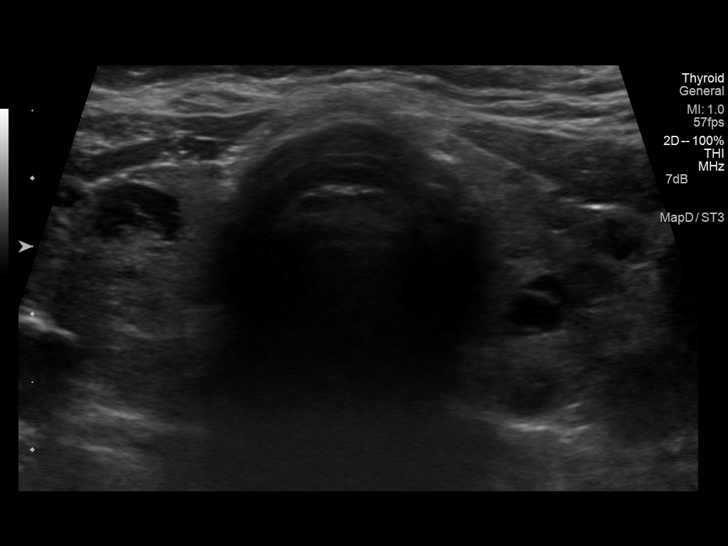
[im 5/58]
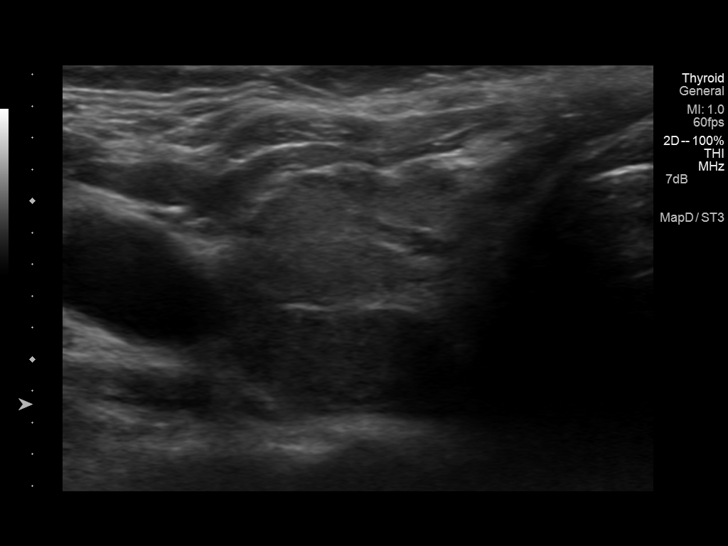
[im 10/58]
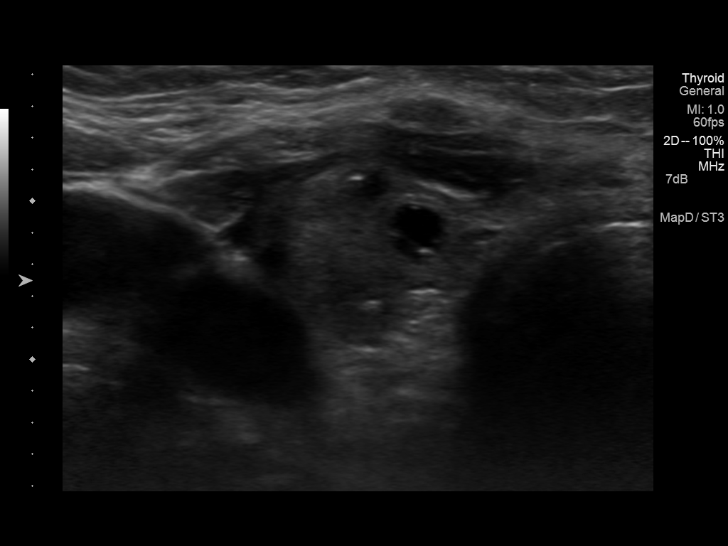
[im 15/58]
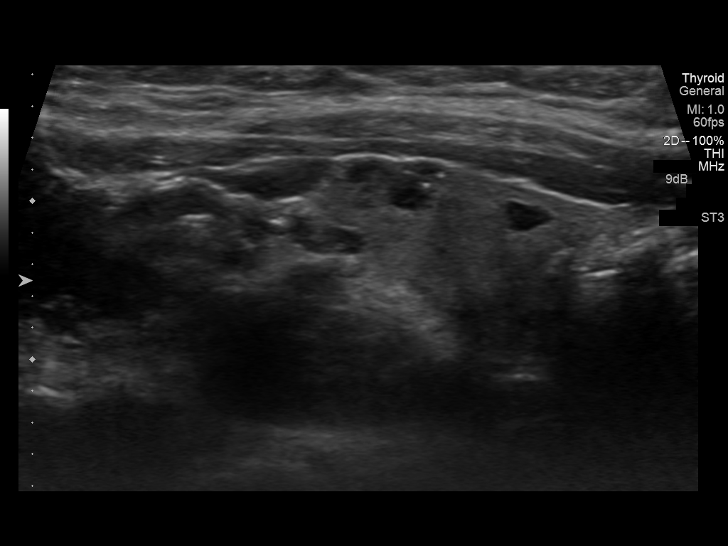
[im 20/58]
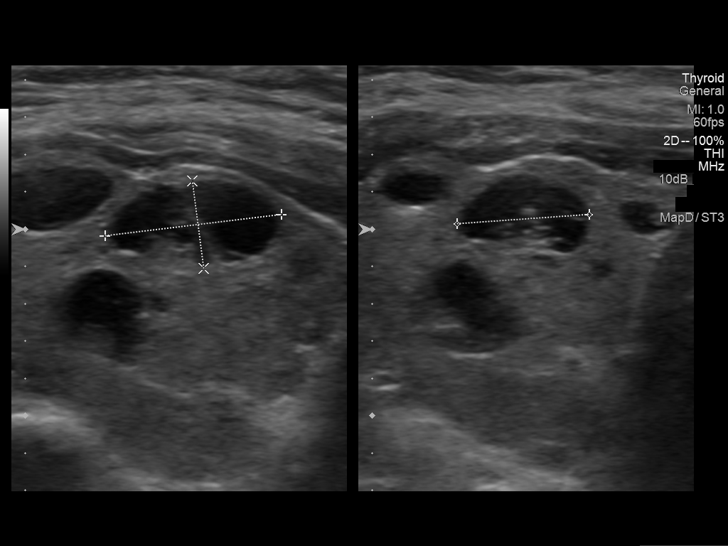
[im 24/58]
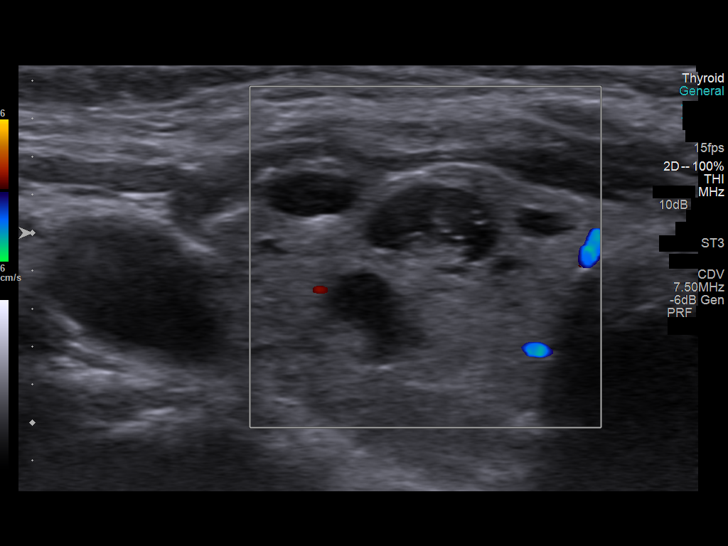
[im 29/58]
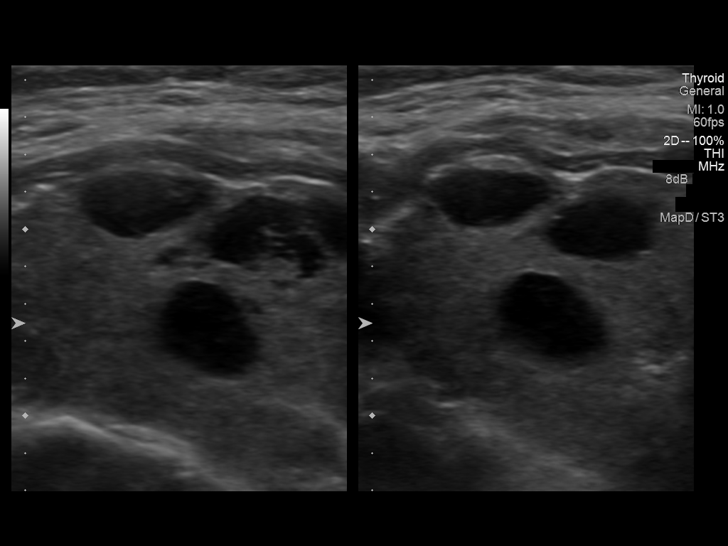
[im 34/58]
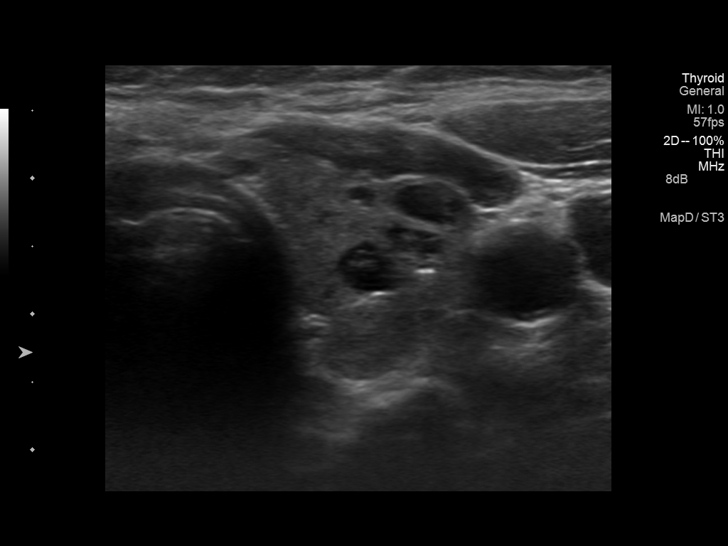
[im 39/58]
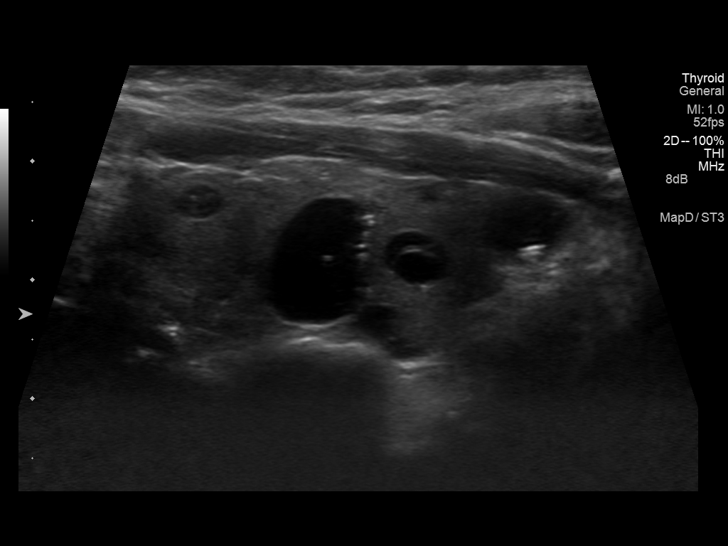
[im 43/58]
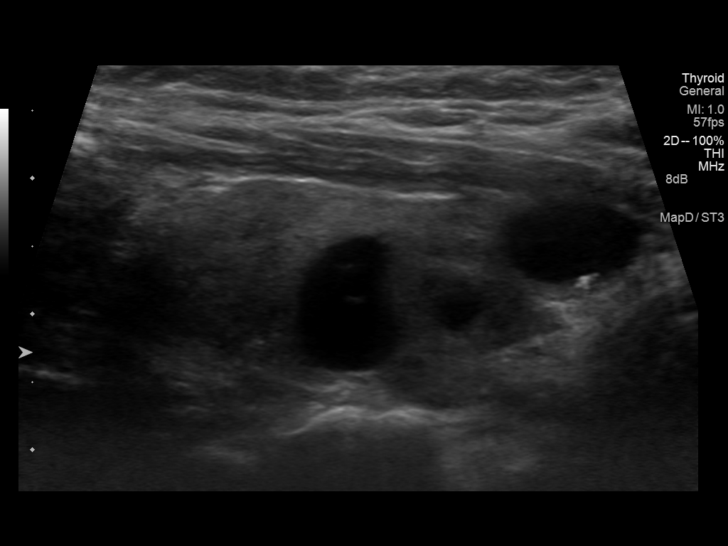
[im 48/58]
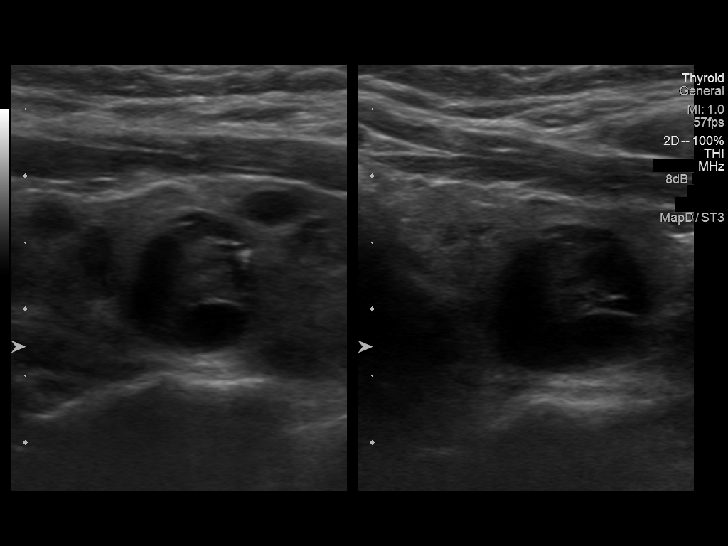
[im 53/58]
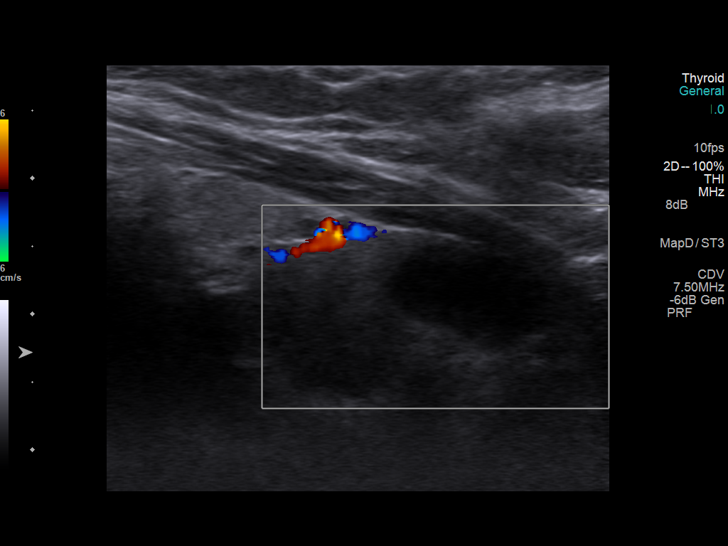
[im 58/58]
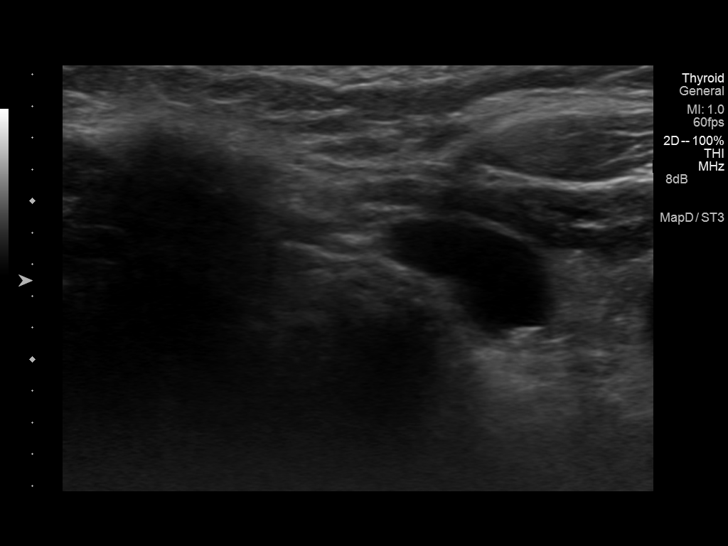

[13 of 25 positions shown; findings below may reference images not displayed]

FINDINGS: Right thyroid lobe

Measurements: 3.7 x 1.6 x 1.7 cm. Heterogeneous background
echotexture. Numerous scattered hypoechoic cystic nodules throughout
the right thyroid lobe. Largest partially cystic nodule in the right
thyroid midpole measures 10 x 5 x 7 mm. None of these meet criteria
for biopsy. These all measure 10 mm or less in size.

Left thyroid lobe

Measurements: 4.1 x 1.7 x 1.4 cm. Similar heterogeneous background
echotexture. Scattered cystic and partially cystic nodules
throughout the left thyroid. Dominant left midpole partially cystic
nodule measures 11 x 11 x 13 mm. Dominant left lower pole cystic
nodule measures 11 x 7 x 13 mm. Other cystic nodules measure 10 mm
or less in size. None of the left thyroid cystic nodules meet
criteria for biopsy.

Isthmus

Thickness: 3 mm.  No nodules visualized.

Lymphadenopathy

None visualized.
IMPRESSION: Numerous bilateral cystic nodules.  Measures as above.

Findings do not meet current SRU consensus criteria for biopsy.
Follow-up by clinical exam is recommended. If patient has known risk
factors for thyroid carcinoma, consider follow-up ultrasound in 12
months. If patient is clinically hyperthyroid, consider nuclear
medicine thyroid uptake and scan.Reference: Management of Thyroid
Nodules Detected at US: Society of Radiologists in Ultrasound

## 2015-05-31 ENCOUNTER — Other Ambulatory Visit: Payer: Self-pay | Admitting: Family Medicine

## 2015-05-31 DIAGNOSIS — R5381 Other malaise: Secondary | ICD-10-CM | POA: Diagnosis not present

## 2015-05-31 DIAGNOSIS — M797 Fibromyalgia: Secondary | ICD-10-CM | POA: Diagnosis not present

## 2015-05-31 DIAGNOSIS — G4709 Other insomnia: Secondary | ICD-10-CM | POA: Diagnosis not present

## 2015-05-31 DIAGNOSIS — M19241 Secondary osteoarthritis, right hand: Secondary | ICD-10-CM | POA: Diagnosis not present

## 2015-06-08 ENCOUNTER — Other Ambulatory Visit: Payer: Self-pay | Admitting: Neurosurgery

## 2015-06-08 ENCOUNTER — Ambulatory Visit: Payer: Medicare Other | Admitting: Nurse Practitioner

## 2015-06-08 DIAGNOSIS — M5136 Other intervertebral disc degeneration, lumbar region: Secondary | ICD-10-CM | POA: Diagnosis not present

## 2015-06-08 DIAGNOSIS — M4806 Spinal stenosis, lumbar region: Secondary | ICD-10-CM | POA: Diagnosis not present

## 2015-06-08 DIAGNOSIS — M549 Dorsalgia, unspecified: Secondary | ICD-10-CM | POA: Diagnosis not present

## 2015-06-08 DIAGNOSIS — M5416 Radiculopathy, lumbar region: Secondary | ICD-10-CM | POA: Diagnosis not present

## 2015-06-08 DIAGNOSIS — M4316 Spondylolisthesis, lumbar region: Secondary | ICD-10-CM | POA: Diagnosis not present

## 2015-06-08 DIAGNOSIS — M4726 Other spondylosis with radiculopathy, lumbar region: Secondary | ICD-10-CM | POA: Diagnosis not present

## 2015-06-08 DIAGNOSIS — M546 Pain in thoracic spine: Secondary | ICD-10-CM | POA: Diagnosis not present

## 2015-06-08 DIAGNOSIS — Z6826 Body mass index (BMI) 26.0-26.9, adult: Secondary | ICD-10-CM | POA: Diagnosis not present

## 2015-06-10 ENCOUNTER — Telehealth: Payer: Self-pay | Admitting: Family Medicine

## 2015-06-10 DIAGNOSIS — L03116 Cellulitis of left lower limb: Secondary | ICD-10-CM

## 2015-06-10 DIAGNOSIS — R229 Localized swelling, mass and lump, unspecified: Secondary | ICD-10-CM

## 2015-06-10 MED ORDER — CEPHALEXIN 500 MG PO CAPS: 500.0000 mg | ORAL_CAPSULE | Freq: Four times a day (QID) | ORAL | Status: DC

## 2015-06-10 NOTE — Telephone Encounter (Signed)
Direct call made to pt, 5 day course of keflex sent to pharmacy and she will see dermatology next week since she is conmcerned about a "spot" on the leg, where the problem keeps recurrenign from. Will call next Monday for appt info

## 2015-06-10 NOTE — Telephone Encounter (Signed)
Patient states that area on the left leg the same area that she was treated for previously.  Please advise.

## 2015-06-10 NOTE — Telephone Encounter (Signed)
Patient states that the place on her leg is back and its real red and feels like it has fever to it, she is asking for an antibiotic please advise?

## 2015-06-15 ENCOUNTER — Encounter (HOSPITAL_COMMUNITY)
Admission: RE | Admit: 2015-06-15 | Discharge: 2015-06-15 | Disposition: A | Discharge status: Home / Self Care | Payer: Medicare Other | Source: Ambulatory Visit | Attending: Neurosurgery | Admitting: Neurosurgery

## 2015-06-15 ENCOUNTER — Encounter (HOSPITAL_COMMUNITY): Payer: Self-pay

## 2015-06-15 DIAGNOSIS — Z01812 Encounter for preprocedural laboratory examination: Secondary | ICD-10-CM | POA: Insufficient documentation

## 2015-06-15 DIAGNOSIS — M4806 Spinal stenosis, lumbar region: Secondary | ICD-10-CM | POA: Insufficient documentation

## 2015-06-15 HISTORY — DX: Chronic obstructive pulmonary disease, unspecified: J44.9

## 2015-06-15 HISTORY — DX: Unspecified osteoarthritis, unspecified site: M19.90

## 2015-06-15 LAB — CBC
HCT: 39 % (ref 36.0–46.0)
Hemoglobin: 12.7 g/dL (ref 12.0–15.0)
MCH: 30.1 pg (ref 26.0–34.0)
MCHC: 32.6 g/dL (ref 30.0–36.0)
MCV: 92.4 fL (ref 78.0–100.0)
Platelets: 223 10*3/uL (ref 150–400)
RBC: 4.22 MIL/uL (ref 3.87–5.11)
RDW: 13.5 % (ref 11.5–15.5)
WBC: 8.1 10*3/uL (ref 4.0–10.5)

## 2015-06-15 LAB — BASIC METABOLIC PANEL
Anion gap: 7 (ref 5–15)
BUN: 24 mg/dL — ABNORMAL HIGH (ref 6–20)
CO2: 25 mmol/L (ref 22–32)
Calcium: 9.6 mg/dL (ref 8.9–10.3)
Chloride: 110 mmol/L (ref 101–111)
Creatinine, Ser: 0.8 mg/dL (ref 0.44–1.00)
GFR calc Af Amer: >60 mL/min (ref >60)
GFR calc non Af Amer: >60 mL/min (ref >60)
Glucose, Bld: 116 mg/dL — ABNORMAL HIGH (ref 65–99)
Potassium: 4.2 mmol/L (ref 3.5–5.1)
Sodium: 142 mmol/L (ref 135–145)

## 2015-06-15 LAB — SURGICAL PCR SCREEN
MRSA, PCR: NEGATIVE
Staphylococcus aureus: POSITIVE — AB

## 2015-06-15 NOTE — Progress Notes (Signed)
Pt. Has been on antbiotic for swelling, (sometimes "fevery") redness in R LE. Pt. Took last keflex yesterday, she will stop at PCP office or go to ED at [facility] today. Pt. Reports that she doesn't have HTN, that she is on HCTZ for "fluid in her ear".

## 2015-06-15 NOTE — Progress Notes (Signed)
Pt. Reports the Trazadone, Tizanidine, Cymbalta, Meloxicam are all Rx for fibromyalgia.  Pt. Reports that she was diagnosed in 1993.

## 2015-06-15 NOTE — Pre-Procedure Instructions (Addendum)
Andrea Santiago  06/15/2015      [id number] - [place], [place] - [address] [place] [address] Phone: [phone] Fax: [phone]  [id number] - [place], [place] [address] [place] [address] Phone: [phone] Fax: [phone]    Your procedure is scheduled on 06/21/2015.  Report to [facility] Admitting at 5:30 A.M.  Call this number if you have problems the morning of surgery:  [phone]   Remember:  Do not eat food or drink liquids after midnight.  On SUNDAY  Take these medicines the morning of surgery with A SIP OF WATER : Tizanidine & Cymbalta, Symbicort, use proair if needed    Do not wear jewelry, make-up or nail polish.   Do not wear lotions, powders, or perfumes.  You may wear deodorant.   Do not shave 48 hours prior to surgery.     Do not bring valuables to the hospital.   [facility] is not responsible for any belongings or valuables.  Contacts, dentures or bridgework may not be worn into surgery.  Leave your suitcase in the car.  After surgery it may be brought to your room.  For patients admitted to the hospital, discharge time will be determined by your treatment team.  Patients discharged the day of surgery will not be allowed to drive home.   Name and phone number of your driver:   With friend   Special instructions:  Special Instructions: [place] - Preparing for Surgery  Before surgery, you can play an important role.  Because skin is not sterile, your skin needs to be as free of germs as possible.  You can reduce the number of germs on you skin by washing with CHG (chlorahexidine gluconate) soap before surgery.  CHG is an antiseptic cleaner which kills germs and bonds with the skin to continue killing germs even after washing.  Please DO NOT use if you have an allergy to CHG or antibacterial soaps.   If your skin becomes reddened/irritated stop using the CHG and inform your nurse when you arrive at Short Stay.  Do not shave (including legs and underarms) for at least 48 hours prior to the first CHG shower.  You may shave your face.  Please follow these instructions carefully:   1.  Shower with CHG Soap the night before surgery and the  morning of Surgery.  2.  If you choose to wash your hair, wash your hair first as usual with your  normal shampoo.  3.  After you shampoo, rinse your hair and body thoroughly to remove the  Shampoo.  4.  Use CHG as you would any other liquid soap.  You can apply chg directly to the skin and wash gently with scrungie or a clean washcloth.  5.  Apply the CHG Soap to your body ONLY FROM THE NECK DOWN.    Do not use on open wounds or open sores.  Avoid contact with your eyes, ears, mouth and genitals (private parts).  Wash genitals (private parts)   with your normal soap.  6.  Wash thoroughly, paying special attention to the area where your surgery will be performed.  7.  Thoroughly rinse your body with warm water from the neck down.  8.  DO NOT shower/wash with your normal soap after using and rinsing off  the CHG Soap.  9.  Pat yourself dry with a clean towel.            10.  Wear clean pajamas.            11.  Place clean sheets on your bed the night of your first shower and do not sleep with pets.  Day of Surgery  Do not apply any lotions/deodorants the morning of surgery.  Please wear clean clothes to the hospital/surgery center.  Please read over the following fact sheets that you were given. Pain Booklet, Coughing and Deep Breathing, MRSA Information and Surgical Site Infection Prevention

## 2015-06-17 ENCOUNTER — Telehealth: Payer: Self-pay

## 2015-06-17 MED ORDER — CEPHALEXIN 500 MG PO CAPS: 500.0000 mg | ORAL_CAPSULE | Freq: Four times a day (QID) | ORAL | Status: DC

## 2015-06-20 MED ORDER — CEFAZOLIN SODIUM-DEXTROSE 2-4 GM/100ML-% IV SOLN
2.0000 g | INTRAVENOUS | Status: AC
  Administered 2015-06-21: 2 g via INTRAVENOUS
  Filled 2015-06-20: qty 100

## 2015-06-21 ENCOUNTER — Ambulatory Visit (HOSPITAL_COMMUNITY): Payer: Medicare Other | Admitting: Certified Registered Nurse Anesthetist

## 2015-06-21 ENCOUNTER — Ambulatory Visit (HOSPITAL_COMMUNITY): Payer: Medicare Other

## 2015-06-21 ENCOUNTER — Encounter (HOSPITAL_COMMUNITY)
Admission: RE | Disposition: A | Discharge status: Home / Self Care | Payer: Self-pay | Source: Ambulatory Visit | Attending: Neurosurgery

## 2015-06-21 ENCOUNTER — Observation Stay (HOSPITAL_COMMUNITY)
Admission: RE | Admit: 2015-06-21 | Discharge: 2015-06-23 | Disposition: A | Discharge status: Home / Self Care | Payer: Medicare Other | Source: Ambulatory Visit | Attending: Neurosurgery | Admitting: Neurosurgery

## 2015-06-21 ENCOUNTER — Encounter (HOSPITAL_COMMUNITY): Payer: Self-pay | Admitting: *Deleted

## 2015-06-21 DIAGNOSIS — M48062 Spinal stenosis, lumbar region with neurogenic claudication: Secondary | ICD-10-CM | POA: Diagnosis present

## 2015-06-21 DIAGNOSIS — K219 Gastro-esophageal reflux disease without esophagitis: Secondary | ICD-10-CM | POA: Diagnosis not present

## 2015-06-21 DIAGNOSIS — M797 Fibromyalgia: Secondary | ICD-10-CM | POA: Diagnosis not present

## 2015-06-21 DIAGNOSIS — M47816 Spondylosis without myelopathy or radiculopathy, lumbar region: Secondary | ICD-10-CM | POA: Insufficient documentation

## 2015-06-21 DIAGNOSIS — M4806 Spinal stenosis, lumbar region: Secondary | ICD-10-CM | POA: Diagnosis not present

## 2015-06-21 DIAGNOSIS — Z87891 Personal history of nicotine dependence: Secondary | ICD-10-CM | POA: Insufficient documentation

## 2015-06-21 DIAGNOSIS — Z419 Encounter for procedure for purposes other than remedying health state, unspecified: Secondary | ICD-10-CM

## 2015-06-21 DIAGNOSIS — I10 Essential (primary) hypertension: Secondary | ICD-10-CM | POA: Diagnosis not present

## 2015-06-21 DIAGNOSIS — E785 Hyperlipidemia, unspecified: Secondary | ICD-10-CM | POA: Diagnosis not present

## 2015-06-21 DIAGNOSIS — M21372 Foot drop, left foot: Secondary | ICD-10-CM | POA: Diagnosis not present

## 2015-06-21 DIAGNOSIS — I2510 Atherosclerotic heart disease of native coronary artery without angina pectoris: Secondary | ICD-10-CM | POA: Diagnosis not present

## 2015-06-21 DIAGNOSIS — J449 Chronic obstructive pulmonary disease, unspecified: Secondary | ICD-10-CM | POA: Insufficient documentation

## 2015-06-21 DIAGNOSIS — M5136 Other intervertebral disc degeneration, lumbar region: Secondary | ICD-10-CM | POA: Diagnosis not present

## 2015-06-21 HISTORY — PX: LUMBAR LAMINECTOMY/DECOMPRESSION MICRODISCECTOMY: SHX5026

## 2015-06-21 SURGERY — LUMBAR LAMINECTOMY/DECOMPRESSION MICRODISCECTOMY 2 LEVELS
Anesthesia: General | Site: Spine Lumbar

## 2015-06-21 MED ORDER — KETOROLAC TROMETHAMINE 30 MG/ML IJ SOLN
15.0000 mg | Freq: Four times a day (QID) | INTRAMUSCULAR | Status: AC
  Administered 2015-06-21 – 2015-06-23 (×6): 15 mg via INTRAVENOUS
  Filled 2015-06-21 (×6): qty 1

## 2015-06-21 MED ORDER — SODIUM CHLORIDE 0.9% FLUSH
3.0000 mL | Freq: Two times a day (BID) | INTRAVENOUS | Status: DC
  Administered 2015-06-21 – 2015-06-22 (×4): 3 mL via INTRAVENOUS

## 2015-06-21 MED ORDER — BUPIVACAINE HCL (PF) 0.5 % IJ SOLN
INTRAMUSCULAR | Status: DC | PRN
  Administered 2015-06-21: 14 mL

## 2015-06-21 MED ORDER — LACTATED RINGERS IV SOLN
INTRAVENOUS | Status: DC | PRN
  Administered 2015-06-21 (×2): via INTRAVENOUS

## 2015-06-21 MED ORDER — ZOLPIDEM TARTRATE 5 MG PO TABS: 5.0000 mg | ORAL_TABLET | Freq: Every evening | ORAL | Status: DC | PRN

## 2015-06-21 MED ORDER — HYDROXYZINE HCL 50 MG/ML IM SOLN: 50.0000 mg | INTRAMUSCULAR | Status: DC | PRN

## 2015-06-21 MED ORDER — ESMOLOL HCL 100 MG/10ML IV SOLN
INTRAVENOUS | Status: DC | PRN
  Administered 2015-06-21: 40 mg via INTRAVENOUS
  Administered 2015-06-21: 20 mg via INTRAVENOUS
  Administered 2015-06-21: 40 mg via INTRAVENOUS

## 2015-06-21 MED ORDER — MEPERIDINE HCL 25 MG/ML IJ SOLN: 6.2500 mg | INTRAMUSCULAR | Status: DC | PRN

## 2015-06-21 MED ORDER — METOCLOPRAMIDE HCL 5 MG/ML IJ SOLN: 10.0000 mg | Freq: Once | INTRAMUSCULAR | Status: DC | PRN

## 2015-06-21 MED ORDER — KETOROLAC TROMETHAMINE 30 MG/ML IJ SOLN: 15.0000 mg | Freq: Once | INTRAMUSCULAR | Status: DC

## 2015-06-21 MED ORDER — ONDANSETRON HCL 4 MG/2ML IJ SOLN
INTRAMUSCULAR | Status: DC | PRN
  Administered 2015-06-21: 4 mg via INTRAVENOUS

## 2015-06-21 MED ORDER — ONDANSETRON HCL 4 MG PO TABS: 4.0000 mg | ORAL_TABLET | Freq: Four times a day (QID) | ORAL | Status: DC | PRN

## 2015-06-21 MED ORDER — MIDAZOLAM HCL 5 MG/5ML IJ SOLN
INTRAMUSCULAR | Status: DC | PRN
  Administered 2015-06-21: 1 mg via INTRAVENOUS

## 2015-06-21 MED ORDER — VANCOMYCIN HCL IN DEXTROSE 1-5 GM/200ML-% IV SOLN
INTRAVENOUS | Status: AC
Start: 2015-06-21 — End: 2015-06-21
  Filled 2015-06-21: qty 200

## 2015-06-21 MED ORDER — VECURONIUM BROMIDE 10 MG IV SOLR
INTRAVENOUS | Status: DC | PRN
  Administered 2015-06-21: 5 mg via INTRAVENOUS
  Administered 2015-06-21 (×3): 2 mg via INTRAVENOUS
  Administered 2015-06-21: 1 mg via INTRAVENOUS
  Administered 2015-06-21: 2 mg via INTRAVENOUS

## 2015-06-21 MED ORDER — SODIUM CHLORIDE 0.9 % IR SOLN
Status: DC | PRN
  Administered 2015-06-21: 09:00:00

## 2015-06-21 MED ORDER — POTASSIUM CHLORIDE CRYS ER 10 MEQ PO TBCR
30.0000 meq | EXTENDED_RELEASE_TABLET | Freq: Every day | ORAL | Status: DC
  Administered 2015-06-22 – 2015-06-23 (×2): 30 meq via ORAL
  Filled 2015-06-21 (×2): qty 1

## 2015-06-21 MED ORDER — FENTANYL CITRATE (PF) 100 MCG/2ML IJ SOLN
INTRAMUSCULAR | Status: DC | PRN
  Administered 2015-06-21 (×7): 50 ug via INTRAVENOUS
  Administered 2015-06-21: 25 ug via INTRAVENOUS

## 2015-06-21 MED ORDER — MORPHINE SULFATE (PF) 4 MG/ML IV SOLN: 4.0000 mg | INTRAVENOUS | Status: DC | PRN

## 2015-06-21 MED ORDER — KETOROLAC TROMETHAMINE 15 MG/ML IJ SOLN
INTRAMUSCULAR | Status: AC
  Administered 2015-06-21: 15 mg
  Filled 2015-06-21: qty 1

## 2015-06-21 MED ORDER — MIDAZOLAM HCL 2 MG/2ML IJ SOLN
INTRAMUSCULAR | Status: AC
  Filled 2015-06-21: qty 2

## 2015-06-21 MED ORDER — HYDROCHLOROTHIAZIDE 25 MG PO TABS
25.0000 mg | ORAL_TABLET | Freq: Every day | ORAL | Status: DC
  Administered 2015-06-22 – 2015-06-23 (×2): 25 mg via ORAL
  Filled 2015-06-21 (×2): qty 1

## 2015-06-21 MED ORDER — LABETALOL HCL 5 MG/ML IV SOLN
INTRAVENOUS | Status: DC | PRN
  Administered 2015-06-21 (×2): 10 mg via INTRAVENOUS

## 2015-06-21 MED ORDER — MAGNESIUM HYDROXIDE 400 MG/5ML PO SUSP: 30.0000 mL | Freq: Every day | ORAL | Status: DC | PRN

## 2015-06-21 MED ORDER — SODIUM CHLORIDE 0.9% FLUSH: 3.0000 mL | INTRAVENOUS | Status: DC | PRN

## 2015-06-21 MED ORDER — ONDANSETRON HCL 4 MG/2ML IJ SOLN: 4.0000 mg | Freq: Four times a day (QID) | INTRAMUSCULAR | Status: DC | PRN

## 2015-06-21 MED ORDER — PROPOFOL 10 MG/ML IV BOLUS
INTRAVENOUS | Status: DC | PRN
  Administered 2015-06-21: 160 mg via INTRAVENOUS

## 2015-06-21 MED ORDER — ACETAMINOPHEN 10 MG/ML IV SOLN
INTRAVENOUS | Status: AC
  Administered 2015-06-21: 1000 mg via INTRAVENOUS
  Filled 2015-06-21: qty 100

## 2015-06-21 MED ORDER — KCL IN DEXTROSE-NACL 20-5-0.45 MEQ/L-%-% IV SOLN: INTRAVENOUS | Status: DC

## 2015-06-21 MED ORDER — THROMBIN 20000 UNITS EX SOLR
CUTANEOUS | Status: DC | PRN
  Administered 2015-06-21: 09:00:00 via TOPICAL

## 2015-06-21 MED ORDER — TOPIRAMATE 25 MG PO TABS
25.0000 mg | ORAL_TABLET | Freq: Every day | ORAL | Status: DC
  Administered 2015-06-21 – 2015-06-22 (×2): 25 mg via ORAL
  Filled 2015-06-21 (×3): qty 1

## 2015-06-21 MED ORDER — CYCLOBENZAPRINE HCL 10 MG PO TABS
10.0000 mg | ORAL_TABLET | Freq: Three times a day (TID) | ORAL | Status: DC | PRN
  Administered 2015-06-21 – 2015-06-23 (×2): 10 mg via ORAL
  Filled 2015-06-21 (×2): qty 1

## 2015-06-21 MED ORDER — BISACODYL 10 MG RE SUPP: 10.0000 mg | Freq: Every day | RECTAL | Status: DC | PRN

## 2015-06-21 MED ORDER — FENTANYL CITRATE (PF) 100 MCG/2ML IJ SOLN: 25.0000 ug | INTRAMUSCULAR | Status: DC | PRN

## 2015-06-21 MED ORDER — VANCOMYCIN HCL 1000 MG IV SOLR
1000.0000 mg | INTRAVENOUS | Status: DC | PRN
  Administered 2015-06-21: 1000 mg via INTRAVENOUS

## 2015-06-21 MED ORDER — LIDOCAINE HCL (CARDIAC) 20 MG/ML IV SOLN
INTRAVENOUS | Status: DC | PRN
  Administered 2015-06-21: 100 mg via INTRAVENOUS

## 2015-06-21 MED ORDER — FENTANYL CITRATE (PF) 250 MCG/5ML IJ SOLN
INTRAMUSCULAR | Status: AC
  Filled 2015-06-21: qty 5

## 2015-06-21 MED ORDER — 0.9 % SODIUM CHLORIDE (POUR BTL) OPTIME
TOPICAL | Status: DC | PRN
  Administered 2015-06-21: 1000 mL

## 2015-06-21 MED ORDER — SCOPOLAMINE 1 MG/3DAYS TD PT72
MEDICATED_PATCH | TRANSDERMAL | Status: AC
  Administered 2015-06-21: 1 via TRANSDERMAL
  Filled 2015-06-21: qty 1

## 2015-06-21 MED ORDER — ACETAMINOPHEN 650 MG RE SUPP: 650.0000 mg | RECTAL | Status: DC | PRN

## 2015-06-21 MED ORDER — ALUM & MAG HYDROXIDE-SIMETH 200-200-20 MG/5ML PO SUSP: 30.0000 mL | Freq: Four times a day (QID) | ORAL | Status: DC | PRN

## 2015-06-21 MED ORDER — HYDROXYZINE HCL 25 MG PO TABS: 50.0000 mg | ORAL_TABLET | ORAL | Status: DC | PRN

## 2015-06-21 MED ORDER — ACETAMINOPHEN 325 MG PO TABS: 650.0000 mg | ORAL_TABLET | ORAL | Status: DC | PRN

## 2015-06-21 MED ORDER — THROMBIN 5000 UNITS EX SOLR
OROMUCOSAL | Status: DC | PRN
  Administered 2015-06-21: 09:00:00 via TOPICAL

## 2015-06-21 MED ORDER — PHENOL 1.4 % MT LIQD: 1.0000 | OROMUCOSAL | Status: DC | PRN

## 2015-06-21 MED ORDER — LIDOCAINE-EPINEPHRINE 1 %-1:100000 IJ SOLN
INTRAMUSCULAR | Status: DC | PRN
  Administered 2015-06-21: 14 mL

## 2015-06-21 MED ORDER — SUGAMMADEX SODIUM 500 MG/5ML IV SOLN
INTRAVENOUS | Status: DC | PRN
  Administered 2015-06-21: 400 mg via INTRAVENOUS

## 2015-06-21 MED ORDER — DEXAMETHASONE SODIUM PHOSPHATE 10 MG/ML IJ SOLN
INTRAMUSCULAR | Status: DC | PRN
  Administered 2015-06-21: 8 mg via INTRAVENOUS

## 2015-06-21 MED ORDER — OXYCODONE-ACETAMINOPHEN 5-325 MG PO TABS
1.0000 | ORAL_TABLET | ORAL | Status: DC | PRN
  Administered 2015-06-21: 1 via ORAL
  Administered 2015-06-22 (×3): 2 via ORAL
  Administered 2015-06-23: 1 via ORAL
  Filled 2015-06-21 (×2): qty 2
  Filled 2015-06-21 (×2): qty 1
  Filled 2015-06-21: qty 2

## 2015-06-21 MED ORDER — DULOXETINE HCL 60 MG PO CPEP
60.0000 mg | ORAL_CAPSULE | Freq: Two times a day (BID) | ORAL | Status: DC
  Administered 2015-06-21 – 2015-06-23 (×4): 60 mg via ORAL
  Filled 2015-06-21 (×3): qty 1
  Filled 2015-06-21 (×2): qty 2
  Filled 2015-06-21 (×2): qty 1

## 2015-06-21 MED ORDER — HYDROCODONE-ACETAMINOPHEN 5-325 MG PO TABS: 1.0000 | ORAL_TABLET | ORAL | Status: DC | PRN

## 2015-06-21 MED ORDER — PROPOFOL 10 MG/ML IV BOLUS
INTRAVENOUS | Status: AC
  Filled 2015-06-21: qty 40

## 2015-06-21 MED ORDER — MENTHOL 3 MG MT LOZG: 1.0000 | LOZENGE | OROMUCOSAL | Status: DC | PRN

## 2015-06-21 MED ORDER — TRAZODONE HCL 150 MG PO TABS
150.0000 mg | ORAL_TABLET | Freq: Every day | ORAL | Status: DC
  Administered 2015-06-21 – 2015-06-22 (×2): 150 mg via ORAL
  Filled 2015-06-21 (×3): qty 1

## 2015-06-21 SURGICAL SUPPLY — 64 items
ADH SKN CLS APL DERMABOND .7 (GAUZE/BANDAGES/DRESSINGS) ×1
APL SKNCLS STERI-STRIP NONHPOA (GAUZE/BANDAGES/DRESSINGS)
BAG DECANTER FOR FLEXI CONT (MISCELLANEOUS) ×2 IMPLANT
BENZOIN TINCTURE PRP APPL 2/3 (GAUZE/BANDAGES/DRESSINGS) IMPLANT
BLADE CLIPPER SURG (BLADE) IMPLANT
BRUSH SCRUB EZ PLAIN DRY (MISCELLANEOUS) ×2 IMPLANT
BUR ACORN 6.0 ACORN (BURR) ×1 IMPLANT
BUR ACRON 5.0MM COATED (BURR) ×1 IMPLANT
BUR MATCHSTICK NEURO 3.0 LAGG (BURR) ×2 IMPLANT
CANISTER SUCT 3000ML PPV (MISCELLANEOUS) ×2 IMPLANT
DERMABOND ADVANCED (GAUZE/BANDAGES/DRESSINGS) ×1
DERMABOND ADVANCED .7 DNX12 (GAUZE/BANDAGES/DRESSINGS) IMPLANT
DRAPE LAPAROTOMY 100X72X124 (DRAPES) ×2 IMPLANT
DRAPE MICROSCOPE LEICA (MISCELLANEOUS) ×2 IMPLANT
DRAPE POUCH INSTRU U-SHP 10X18 (DRAPES) ×2 IMPLANT
DRSG EMULSION OIL 3X3 NADH (GAUZE/BANDAGES/DRESSINGS) IMPLANT
ELECT REM PT RETURN 9FT ADLT (ELECTROSURGICAL) ×2
ELECTRODE REM PT RTRN 9FT ADLT (ELECTROSURGICAL) ×1 IMPLANT
GAUZE SPONGE 4X4 12PLY STRL (GAUZE/BANDAGES/DRESSINGS) ×1 IMPLANT
GAUZE SPONGE 4X4 16PLY XRAY LF (GAUZE/BANDAGES/DRESSINGS) IMPLANT
GLOVE BIO SURGEON STRL SZ 6.5 (GLOVE) ×2 IMPLANT
GLOVE BIOGEL PI IND STRL 7.0 (GLOVE) IMPLANT
GLOVE BIOGEL PI IND STRL 7.5 (GLOVE) IMPLANT
GLOVE BIOGEL PI IND STRL 8 (GLOVE) ×1 IMPLANT
GLOVE BIOGEL PI INDICATOR 7.0 (GLOVE) ×3
GLOVE BIOGEL PI INDICATOR 7.5 (GLOVE) ×2
GLOVE BIOGEL PI INDICATOR 8 (GLOVE) ×2
GLOVE ECLIPSE 7.5 STRL STRAW (GLOVE) ×3 IMPLANT
GLOVE SS BIOGEL STRL SZ 7 (GLOVE) IMPLANT
GLOVE SUPERSENSE BIOGEL SZ 7 (GLOVE) ×1
GOWN STRL REUS W/ TWL LRG LVL3 (GOWN DISPOSABLE) ×1 IMPLANT
GOWN STRL REUS W/ TWL XL LVL3 (GOWN DISPOSABLE) IMPLANT
GOWN STRL REUS W/TWL 2XL LVL3 (GOWN DISPOSABLE) IMPLANT
GOWN STRL REUS W/TWL LRG LVL3 (GOWN DISPOSABLE) ×4
GOWN STRL REUS W/TWL XL LVL3 (GOWN DISPOSABLE) ×2
HEMOSTAT POWDER SURGIFOAM 1G (HEMOSTASIS) ×1 IMPLANT
KIT BASIN OR (CUSTOM PROCEDURE TRAY) ×2 IMPLANT
KIT ROOM TURNOVER OR (KITS) ×2 IMPLANT
NDL HYPO 18GX1.5 BLUNT FILL (NEEDLE) IMPLANT
NDL SPNL 18GX3.5 QUINCKE PK (NEEDLE) ×1 IMPLANT
NDL SPNL 22GX3.5 QUINCKE BK (NEEDLE) ×1 IMPLANT
NEEDLE HYPO 18GX1.5 BLUNT FILL (NEEDLE) IMPLANT
NEEDLE SPNL 18GX3.5 QUINCKE PK (NEEDLE) ×2 IMPLANT
NEEDLE SPNL 22GX3.5 QUINCKE BK (NEEDLE) ×2 IMPLANT
NS IRRIG 1000ML POUR BTL (IV SOLUTION) ×3 IMPLANT
PACK LAMINECTOMY NEURO (CUSTOM PROCEDURE TRAY) ×2 IMPLANT
PAD ARMBOARD 7.5X6 YLW CONV (MISCELLANEOUS) ×6 IMPLANT
PATTIES SURGICAL .5 X.5 (GAUZE/BANDAGES/DRESSINGS) ×1 IMPLANT
PATTIES SURGICAL .5 X1 (DISPOSABLE) ×3 IMPLANT
PATTIES SURGICAL 1X1 (DISPOSABLE) ×1 IMPLANT
RUBBERBAND STERILE (MISCELLANEOUS) ×4 IMPLANT
SPONGE LAP 4X18 X RAY DECT (DISPOSABLE) IMPLANT
SPONGE SURGIFOAM ABS GEL 100 (HEMOSTASIS) ×2 IMPLANT
STRIP CLOSURE SKIN 1/2X4 (GAUZE/BANDAGES/DRESSINGS) ×1 IMPLANT
SUT PROLENE 6 0 BV (SUTURE) ×2 IMPLANT
SUT VIC AB 1 CT1 18XBRD ANBCTR (SUTURE) ×1 IMPLANT
SUT VIC AB 1 CT1 8-18 (SUTURE) ×8
SUT VIC AB 2-0 CP2 18 (SUTURE) ×3 IMPLANT
SUT VIC AB 3-0 SH 8-18 (SUTURE) ×1 IMPLANT
SYR 5ML LL (SYRINGE) IMPLANT
TAPE CLOTH SURG 4X10 WHT LF (GAUZE/BANDAGES/DRESSINGS) ×1 IMPLANT
TOWEL OR 17X24 6PK STRL BLUE (TOWEL DISPOSABLE) ×2 IMPLANT
TOWEL OR 17X26 10 PK STRL BLUE (TOWEL DISPOSABLE) ×2 IMPLANT
WATER STERILE IRR 1000ML POUR (IV SOLUTION) ×2 IMPLANT
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
## 2015-06-21 NOTE — Telephone Encounter (Signed)
Her surgery is today, the day that I am back in the office, patient should have been advised to go to urgent care for evaluation and management  of her complaint

## 2015-06-21 NOTE — Telephone Encounter (Signed)
Thanks for update will follow up

## 2015-06-21 NOTE — Anesthesia Procedure Notes (Signed)
Procedure Name: Intubation Date/Time: 06/21/2015 7:45 AM Performed by: Bethel Born Pre-anesthesia Checklist: Patient identified, Timeout performed, Emergency Drugs available, Suction available and Patient being monitored Patient Re-evaluated:Patient Re-evaluated prior to inductionOxygen Delivery Method: Circle system utilized Preoxygenation: Pre-oxygenation with 100% oxygen Intubation Type: IV induction Ventilation: Mask ventilation without difficulty and Oral airway inserted - appropriate to patient size Laryngoscope Size: Sabra Heck and 2 Grade View: Grade I Tube type: Oral Tube size: 7.0 mm Number of attempts: 1 Airway Equipment and Method: Stylet Placement Confirmation: ETT inserted through vocal cords under direct vision,  breath sounds checked- equal and bilateral and positive ETCO2 Secured at: 23 cm Tube secured with: Tape Dental Injury: Teeth and Oropharynx as per pre-operative assessment

## 2015-06-21 NOTE — Telephone Encounter (Signed)
Late Entry:   Patient presented to office on the day that she had pre op testing at [facility].  States that leg was better but still red and she was concerned about if she needed additional abt.  Advised patient to contact surgeon first for additional abt as pcp was out of office.  Patient did Dentist but was told that there should be an on call that she can consult with.  Message was sent to oncall to see if record could be reviewed for additional abt.

## 2015-06-21 NOTE — Progress Notes (Signed)
Per CRNA Amy, pt scratched left eye whe she woke up agitated in OR. She says Dr Elton Sin is aware. Sclera is  Reddened. Pt to stay flat till later today per Dr Rita Ohara.

## 2015-06-21 NOTE — Anesthesia Postprocedure Evaluation (Signed)
Anesthesia Post Note  Patient: Andrea Santiago  Procedure(s) Performed: Procedure(s) (LRB): LUMBAR THREE-FOUR, LUMBAR FOUR-FIVE LUMBAR LAMINECTOMY/DECOMPRESSION MICRODISCECTOMY  (N/A)  Patient location during evaluation: PACU Anesthesia Type: General Level of consciousness: awake and alert Pain management: pain level controlled Vital Signs Assessment: post-procedure vital signs reviewed and stable Respiratory status: spontaneous breathing, nonlabored ventilation, respiratory function stable and patient connected to nasal cannula oxygen Cardiovascular status: blood pressure returned to baseline and stable Postop Assessment: no signs of nausea or vomiting Anesthetic complications: yes Anesthetic complication details: anesthesia complicationsComments: Upon emergence, patient violently rubbed left eye causing a scleral hemorrhage. No pain. No corneal abrasion.    Last Vitals:  Filed Vitals:   06/21/15 1206 06/21/15 1215  BP: 125/57   Pulse:  83  Temp:    Resp:  13    Last Pain:  Filed Vitals:   06/21/15 1217  PainSc: 0-No pain                 Montez Hageman

## 2015-06-21 NOTE — Progress Notes (Signed)
Filed Vitals:   06/21/15 1251 06/21/15 1257 06/21/15 1317 06/21/15 1604  BP: 111/52  134/66 100/47  Pulse:  88 98 93  Temp:  97.6 F (36.4 C) 97.6 F (36.4 C) 97.6 F (36.4 C)  Resp:  20 18 18   Weight:      SpO2:  94% 96% 99%    Patient resting in bed. Has been quite drowsy from anesthesia. However she did ambulate in the halls with a staff. Will leave Foley in until early a.m., then DC, and monitor voiding function through tomorrow. Encouraged to ambulate again this evening, and further tomorrow. Dressing clean and dry.  Plan: Continue to progress through postoperative recovery.  Hosie Spangle, MD 06/21/2015, 7:20 PM

## 2015-06-21 NOTE — Transfer of Care (Signed)
Immediate Anesthesia Transfer of Care Note  Patient: Andrea Santiago  Procedure(s) Performed: Procedure(s) with comments: LUMBAR THREE-FOUR, LUMBAR FOUR-FIVE LUMBAR LAMINECTOMY/DECOMPRESSION MICRODISCECTOMY  (N/A) - L3-L5 decompressive lumbar laminectomy  Patient Location: PACU  Anesthesia Type:General  Level of Consciousness: awake, alert , oriented, patient cooperative and responds to stimulation  Airway & Oxygen Therapy: Patient Spontanous Breathing and Patient connected to face mask  Post-op Assessment: Report given to RN and Post -op Vital signs reviewed and stable , moving extremities x 4  Post vital signs: Reviewed and stable  Last Vitals:  Filed Vitals:   06/21/15 0608  BP: 126/62  Pulse: 76  Temp: 36.4 C  Resp: 20    Last Pain: There were no vitals filed for this visit.       Complications: No apparent anesthesia complications

## 2015-06-21 NOTE — Anesthesia Preprocedure Evaluation (Addendum)
Anesthesia Evaluation  Patient identified by MRN, date of birth, ID band Patient awake    Reviewed: Allergy & Precautions, NPO status , Patient's Chart, lab work & pertinent test results  History of Anesthesia Complications (+) PONV  Airway Mallampati: II  TM Distance: >3 FB Neck ROM: Full    Dental no notable dental hx.    Pulmonary COPD, former smoker,    Pulmonary exam normal breath sounds clear to auscultation       Cardiovascular negative cardio ROS Normal cardiovascular exam Rhythm:Regular Rate:Normal     Neuro/Psych negative neurological ROS  negative psych ROS   GI/Hepatic negative GI ROS, Neg liver ROS,   Endo/Other  negative endocrine ROS  Renal/GU negative Renal ROS  negative genitourinary   Musculoskeletal  (+) Fibromyalgia -  Abdominal   Peds negative pediatric ROS (+)  Hematology negative hematology ROS (+)   Anesthesia Other Findings   Reproductive/Obstetrics negative OB ROS                            Anesthesia Physical Anesthesia Plan  ASA: II  Anesthesia Plan: General   Post-op Pain Management:    Induction: Intravenous  Airway Management Planned: Oral ETT  Additional Equipment:   Intra-op Plan:   Post-operative Plan: Extubation in OR  Informed Consent: I have reviewed the patients History and Physical, chart, labs and discussed the procedure including the risks, benefits and alternatives for the proposed anesthesia with the patient or authorized representative who has indicated his/her understanding and acceptance.   Dental advisory given  Plan Discussed with: CRNA  Anesthesia Plan Comments:         Anesthesia Quick Evaluation

## 2015-06-21 NOTE — Op Note (Signed)
06/21/2015  11:03 AM  PATIENT:  Andrea Santiago  75 y.o. female  PRE-OPERATIVE DIAGNOSIS:  Multilevel, multifactorial lumbar stenosis with neurogenic claudication, lumbar spondylosis, lumbar degenerative disease, left foot drop  POST-OPERATIVE DIAGNOSIS:  Multilevel, multifactorial lumbar stenosis with neurogenic claudication, lumbar spondylosis, lumbar degenerative disease, left foot drop  PROCEDURE:  Procedure(s):  L3, L4, and L5 lumbar laminectomy, medial facetectomy, and foraminotomies for the exiting L3, L4, and L5 nerve roots with microdissection, microsurgical technique, and the operating microscope  SURGEON:  Surgeon(s): Jovita Gamma, MD Kevan Ny Ditty, MD  ASSISTANTS: Melodie Bouillon, M.D.  ANESTHESIA:   general  EBL:  Total I/O In: 1000 [I.V.:1000] Out: 290 [Urine:140; Blood:150]  BLOOD ADMINISTERED:none  COUNT: Correct per nursing staff  DICTATION:  Patient was brought to the operating room placed under general endotracheal anesthesia. Patient was turned to a prone position the lumbar region was prepped with Betadine soap and solution and draped in a sterile fashion. The midline was infiltrated with local anesthetic with epinephrine. A midline incision was made carried down thru the subcutaneous tissue, bipolar cautery and electrocautery were used to maintain hemostasis. Dissection was carried down to the lumbar fascia which was incised bilaterally and the paraspinal muscles were dissected from the spinous process and lamina in a subperiosteal fashion. A localizing x-ray was taken and the L3, L4, and L5 levels were identified. Laminectomy was begun with double-action rongeurs, the high-speed drill, and Kerrison punches. The thickened ligamentum flavum was carefully removed. Dissection was carried laterally, performing medial facetectomies to decompress the lateral stenosis taking care to leave the facet complexes intact. Foraminotomy was performed for the stenotic compression  of the exiting L3, L4, and L5 nerve roots bilaterally. At the L4-5 level the spondylitic overgrowth was adherent to the thecal sac, and a small rent occurred during the dissection. This was closed with 6-0 Prolene suture. This was performed with the operating microscope and microsurgical technique. Once the decompression was completed hemostasis was established with the use of bipolar cautery, Gelfoam with thrombin, and Surgifoam. Paraspinal muscles, deep fascia, and Scarpa's fascia were closed in separate layers with interrupted 1 undyed Vicryl sutures. The subcutaneous and subcuticular were closed with interrupted inverted 2-0 undyed Vicryl sutures. Skin edges were approximated with Dermabond. The wound was dressed with sterile gauze and Hypafix.   PLAN OF CARE: Admit for overnight observation  PATIENT DISPOSITION:  PACU - hemodynamically stable.   Delay start of Pharmacological VTE agent (>24hrs) due to surgical blood loss or risk of bleeding:  yes

## 2015-06-22 ENCOUNTER — Other Ambulatory Visit: Payer: Self-pay | Admitting: Family Medicine

## 2015-06-22 DIAGNOSIS — M4806 Spinal stenosis, lumbar region: Secondary | ICD-10-CM | POA: Diagnosis not present

## 2015-06-22 DIAGNOSIS — M47816 Spondylosis without myelopathy or radiculopathy, lumbar region: Secondary | ICD-10-CM | POA: Diagnosis not present

## 2015-06-22 DIAGNOSIS — I2510 Atherosclerotic heart disease of native coronary artery without angina pectoris: Secondary | ICD-10-CM | POA: Diagnosis not present

## 2015-06-22 DIAGNOSIS — E785 Hyperlipidemia, unspecified: Secondary | ICD-10-CM | POA: Diagnosis not present

## 2015-06-22 DIAGNOSIS — I10 Essential (primary) hypertension: Secondary | ICD-10-CM | POA: Diagnosis not present

## 2015-06-22 DIAGNOSIS — M21372 Foot drop, left foot: Secondary | ICD-10-CM | POA: Diagnosis not present

## 2015-06-22 NOTE — Progress Notes (Signed)
Filed Vitals:   06/21/15 1604 06/21/15 2026 06/21/15 2312 06/22/15 0349  BP: 100/47 113/72 131/47 115/56  Pulse: 93 101 77 81  Temp: 97.6 F (36.4 C) 98.5 F (36.9 C) 99.2 F (37.3 C) 98.3 F (36.8 C)  TempSrc:  Oral Oral Oral  Resp: 18 18 18 18   Weight:      SpO2: 99% 98% 98% 95%    Patient resting in bed. Walked a little earlier. Foley DC'd couple of hours ago. No void yet. Nursing staff to monitor voiding function. Dressing clean and dry.  Plan: Encouraged to ambulate in the halls with the staff 5-6 times today and this evening. We'll continue to progress through postoperative recovery.  Hosie Spangle, MD 06/22/2015, 7:56 AM

## 2015-06-23 DIAGNOSIS — I10 Essential (primary) hypertension: Secondary | ICD-10-CM | POA: Diagnosis not present

## 2015-06-23 DIAGNOSIS — E785 Hyperlipidemia, unspecified: Secondary | ICD-10-CM | POA: Diagnosis not present

## 2015-06-23 DIAGNOSIS — M4806 Spinal stenosis, lumbar region: Secondary | ICD-10-CM | POA: Diagnosis not present

## 2015-06-23 DIAGNOSIS — I2510 Atherosclerotic heart disease of native coronary artery without angina pectoris: Secondary | ICD-10-CM | POA: Diagnosis not present

## 2015-06-23 DIAGNOSIS — M47816 Spondylosis without myelopathy or radiculopathy, lumbar region: Secondary | ICD-10-CM | POA: Diagnosis not present

## 2015-06-23 DIAGNOSIS — M21372 Foot drop, left foot: Secondary | ICD-10-CM | POA: Diagnosis not present

## 2015-06-23 MED ORDER — HYDROCODONE-ACETAMINOPHEN 5-325 MG PO TABS: 1.0000 | ORAL_TABLET | ORAL | Status: DC | PRN

## 2015-06-23 NOTE — Discharge Summary (Signed)
Physician Discharge Summary  Patient ID: Andrea Santiago MRN: [id number] DOB/AGE: [birth year] 75 y.o.  Admit date: 06/21/2015 Discharge date: 06/23/2015  Admission Diagnoses:  Multilevel, multifactorial lumbar stenosis with neurogenic claudication, lumbar spondylosis, lumbar degenerative disease, left foot drop  Discharge Diagnoses:  Multilevel, multifactorial lumbar stenosis with neurogenic claudication, lumbar spondylosis, lumbar degenerative disease, left foot drop Active Problems:   Lumbar stenosis with neurogenic claudication   Discharged Condition: good  Hospital Course: Patient admitted, underwent a L3, L4, and L5 decompressive lumbar laminectomy. Postoperatively she is done well. She is up and ambulate actively in the halls. Her Foley was discontinued, and she is voiding well. Her dressing was discontinued, and the wounds healing nicely. There is no erythema, swelling, or drainage. She is being discharged home with instructions regarding wound care and activities. She is scheduled to follow-up with me in the office in 3 weeks.  Discharge Exam: Blood pressure 111/43, pulse 66, temperature 98.3 F (36.8 C), temperature source Oral, resp. rate 18, weight 74.844 kg (165 lb), SpO2 96 %.  Disposition: 01-Home or Self Care     Medication List    TAKE these medications        calcium-vitamin D 500-200 MG-UNIT tablet  Commonly known as:  OSCAL 500/200 D-3  Take 1 tablet by mouth 2 (two) times daily.     cephALEXin 500 MG capsule  Commonly known as:  KEFLEX  Take 1 capsule (500 mg total) by mouth 4 (four) times daily.     DULoxetine 60 MG capsule  Commonly known as:  CYMBALTA  TAKE ONE CAPSULE BY MOUTH TWICE A DAY     Fenofibric Acid 135 MG Cpdr  TAKE ONE CAPSULE BY MOUTH EVERY DAY     GINGER PO  Take 1 tablet by mouth daily.     hydrochlorothiazide 25 MG tablet  Commonly known as:  HYDRODIURIL  TAKE 1 TABLET BY MOUTH EVERY DAY     HYDROcodone-acetaminophen 5-325 MG tablet   Commonly known as:  NORCO/VICODIN  Take 1-2 tablets by mouth every 4 (four) hours as needed (mild pain).     KLOR-CON M10 10 MEQ tablet  Generic drug:  potassium chloride  TAKE 3 TABLETS (30 MEQ TOTAL) BY MOUTH DAILY.     meloxicam 7.5 MG tablet  Commonly known as:  MOBIC  TAKE 1 TABLET BY MOUTH DAILY     niacin 1000 MG CR tablet  Commonly known as:  NIASPAN  TAKE 2 TABLETS (2,000 MG TOTAL) BY MOUTH AT BEDTIME.     polyethylene glycol powder powder  Commonly known as:  GLYCOLAX/MIRALAX  Take 255 g by mouth 2 (two) times daily as needed.     PROAIR HFA 108 (90 Base) MCG/ACT inhaler  Generic drug:  albuterol  INHALE 2 PUFFS INTO THE LUNGS EVERY 4 HOURS AS NEEDED FOR WHEEZING     SYMBICORT 160-4.5 MCG/ACT inhaler  Generic drug:  budesonide-formoterol  Inhale 2 puffs into the lungs 2 (two) times daily.     tiZANidine 4 MG tablet  Commonly known as:  ZANAFLEX  TAKE 1 TABLET 3 TIMES A DAY     topiramate 25 MG tablet  Commonly known as:  TOPAMAX  Take 25 mg by mouth at bedtime.     traZODone 150 MG tablet  Commonly known as:  DESYREL  TAKE 1 TABLET BY MOUTH AT BEDTIME     VITAMIN B 12 PO  Take 1 tablet by mouth daily.     vitamin C 1000 MG tablet  Take 1,000 mg by mouth daily.     VITAMIN D PO  Take 1 tablet by mouth daily.     VOLTAREN 1 % Gel  Generic drug:  diclofenac sodium  Apply 1 application topically 3 (three) times daily as needed (for pain).         SignedHosie Spangle 06/23/2015, 12:14 PM

## 2015-06-23 NOTE — Discharge Instructions (Signed)

## 2015-06-23 NOTE — Progress Notes (Signed)
Patient alert and oriented, mae's well, voiding adequate amount of urine, swallowing without difficulty, no c/o pain. Patient discharged home with family. Script and discharged instructions given to patient. Patient and family stated understanding of d/c instructions given and has an appointment with MD. 

## 2015-06-24 ENCOUNTER — Encounter (HOSPITAL_COMMUNITY): Payer: Self-pay | Admitting: Neurosurgery

## 2015-06-29 ENCOUNTER — Other Ambulatory Visit: Payer: Self-pay | Admitting: Family Medicine

## 2015-07-09 ENCOUNTER — Encounter: Payer: Self-pay | Admitting: Family Medicine

## 2015-07-09 ENCOUNTER — Ambulatory Visit (INDEPENDENT_AMBULATORY_CARE_PROVIDER_SITE_OTHER): Payer: Medicare Other | Admitting: Family Medicine

## 2015-07-09 VITALS — BP 122/70 | HR 76 | Resp 16 | Ht 63.0 in | Wt 162.0 lb

## 2015-07-09 DIAGNOSIS — I10 Essential (primary) hypertension: Secondary | ICD-10-CM | POA: Diagnosis not present

## 2015-07-09 DIAGNOSIS — M4806 Spinal stenosis, lumbar region: Secondary | ICD-10-CM

## 2015-07-09 DIAGNOSIS — K219 Gastro-esophageal reflux disease without esophagitis: Secondary | ICD-10-CM

## 2015-07-09 DIAGNOSIS — E663 Overweight: Secondary | ICD-10-CM

## 2015-07-09 DIAGNOSIS — L989 Disorder of the skin and subcutaneous tissue, unspecified: Secondary | ICD-10-CM

## 2015-07-09 DIAGNOSIS — M48062 Spinal stenosis, lumbar region with neurogenic claudication: Secondary | ICD-10-CM

## 2015-07-09 NOTE — Progress Notes (Signed)
Subjective:    Patient ID: Andrea Santiago, female    DOB: 05-05-1940, 75 y.o.   MRN: [id number]  HPI  Pt in with c/o redness and rash on right leg, initally presented inMarch, expressed concern re insect bite at the time with similar complaint, was treated with antibiotic course, it improved , however in April called in again with concern, short antibiotic course was prescribed from tele call, pt stated unable to come to office as she was going out of town, reports it was worse in April than in March,, states did get better, the swelling and fever resolved, but her foot started getting numb, and she also noted rough texture on overlying skin. End April, pt called in for antibiotic as she was concerned that she was having surgery and the right leg waas still red and now rough. On call back up Provider sent in an antibiotic course , which she completed on May 1, symptoms improved, however still concerned about abnormal skin texture, lumps in skin , numbness of foot, and abnormal color of area Has had lumbar surgery, states pain much improved, bu concerned about numbness in foot    Review of Systems See HPI Denies recent fever or chills. Denies sinus pressure, nasal congestion, ear pain or sore throat. Denies chest congestion, productive cough or wheezing. Denies chest pains, palpitations and leg swelling Denies abdominal pain, nausea, vomiting,diarrhea or constipation.   Denies dysuria, frequency, hesitancy or incontinence.  Denies depression, anxiety or insomnia.       Objective:   Physical Exam BP 122/70 mmHg  Pulse 76  Resp 16  Ht 5\' 3"  (1.6 m)  Wt 162 lb (73.483 kg)  BMI 28.70 kg/m2  SpO2 100%  Patient alert and oriented and in no cardiopulmonary distress.  HEENT: No facial asymmetry, EOMI,   oropharynx pink and moist.  Neck supple no JVD, no mass.  Chest: Clear to auscultation bilaterally.  CVS: S1, S2 no murmurs, no S3.Regular rate.  ABD: Soft non tender.   Ext:  No edema  MS: Adequate though reduced  ROM spine, shoulders, hips and knees.  Skin: Intact, mild erythema and rough skin texture of distal right leg, no drainage or warmth. No ulceration of skin in affected area Psych: Good eye contact, normal affect. Memory intact not anxious or depressed appearing.  CNS: CN 2-12 intact, power,  normal throughout.no focal deficits noted.        Assessment & Plan:  Skin lesion of right leg 2.5 month h/o erythematous rash on right leg has had 3 antibiotic courses, concerned about rough texture of skin and also c/o numbness in right foot. Of note , had spine surgery on May 1 On exam, no evidence of active infection, will refer to derm for eval  Lumbar stenosis with neurogenic claudication Improved pain s/p surgery  Essential hypertension Controlled, no change in medication DASH diet and commitment to daily physical activity for a minimum of 30 minutes discussed and encouraged, as a part of hypertension management. The importance of attaining a healthy weight is also discussed.  BP/Weight 07/09/2015 06/23/2015 06/21/2015 06/15/2015 05/28/2015 123XX123 [id number]  Systolic BP 123XX123 [id number] - [id number] - [id number]  Diastolic BP 70 55 - 57 - 72 62  Wt. (Lbs) 162 - 165 165.1 160 161.12 158  BMI 28.7 - 28.31 28.33 26.63 28.55 28        Overweight Improved. Pt applauded on succesful weight loss through lifestyle change, and encouraged to continue same. Weight loss goal  set for the next several months.   GERD Controlled, no change in medication

## 2015-07-09 NOTE — Patient Instructions (Signed)
F/u with rectal in August as before  You are referred top dermatologist in [place] per your request  Pls discuss numbness with surgeon, thankful pain is much better  Thank you  for choosing [facility]. We consider it a privelige to serve you.  Delivering excellent health care in a caring and  compassionate way is our goal.  Partnering with you,  so that together we can achieve this goal is our strategy.

## 2015-07-10 ENCOUNTER — Encounter: Payer: Self-pay | Admitting: Family Medicine

## 2015-07-10 NOTE — Assessment & Plan Note (Signed)
Controlled, no change in medication  

## 2015-07-10 NOTE — Assessment & Plan Note (Signed)
Improved. Pt applauded on succesful weight loss through lifestyle change, and encouraged to continue same. Weight loss goal set for the next several months.  

## 2015-07-10 NOTE — Assessment & Plan Note (Signed)
Improved pain s/p surgery

## 2015-07-10 NOTE — Assessment & Plan Note (Signed)
2.5 month h/o erythematous rash on right leg has had 3 antibiotic courses, concerned about rough texture of skin and also c/o numbness in right foot. Of note , had spine surgery on May 1 On exam, no evidence of active infection, will refer to derm for eval

## 2015-07-10 NOTE — Assessment & Plan Note (Signed)
Controlled, no change in medication DASH diet and commitment to daily physical activity for a minimum of 30 minutes discussed and encouraged, as a part of hypertension management. The importance of attaining a healthy weight is also discussed.  BP/Weight 07/09/2015 06/23/2015 06/21/2015 06/15/2015 05/28/2015 123XX123 [id number]  Systolic BP 123XX123 [id number] - [id number] - [id number]  Diastolic BP 70 55 - 57 - 72 62  Wt. (Lbs) 162 - 165 165.1 160 161.12 158  BMI 28.7 - 28.31 28.33 26.63 28.55 28

## 2015-07-22 ENCOUNTER — Telehealth: Payer: Self-pay

## 2015-07-22 ENCOUNTER — Other Ambulatory Visit: Payer: Self-pay

## 2015-07-22 MED ORDER — MECLIZINE HCL 25 MG PO TABS: 25.0000 mg | ORAL_TABLET | Freq: Every day | ORAL | Status: DC | PRN

## 2015-07-22 NOTE — Telephone Encounter (Signed)
Patient aware that medication is refilled.  She is requesting this just to have on hand if she does have an episode of dizziness.  She is aware that she should call the office if she does have symptoms of dizziness prior to taking medication.

## 2015-08-06 ENCOUNTER — Other Ambulatory Visit: Payer: Self-pay | Admitting: Family Medicine

## 2015-08-06 ENCOUNTER — Other Ambulatory Visit: Payer: Self-pay | Admitting: Nurse Practitioner

## 2015-08-26 ENCOUNTER — Other Ambulatory Visit: Payer: Self-pay

## 2015-08-26 DIAGNOSIS — D2272 Melanocytic nevi of left lower limb, including hip: Secondary | ICD-10-CM | POA: Diagnosis not present

## 2015-08-26 DIAGNOSIS — D2261 Melanocytic nevi of right upper limb, including shoulder: Secondary | ICD-10-CM | POA: Diagnosis not present

## 2015-08-26 DIAGNOSIS — D2271 Melanocytic nevi of right lower limb, including hip: Secondary | ICD-10-CM | POA: Diagnosis not present

## 2015-08-26 DIAGNOSIS — D225 Melanocytic nevi of trunk: Secondary | ICD-10-CM | POA: Diagnosis not present

## 2015-08-26 MED ORDER — NIACIN ER (ANTIHYPERLIPIDEMIC) 1000 MG PO TBCR: EXTENDED_RELEASE_TABLET | ORAL | Status: DC

## 2015-08-26 MED ORDER — MELOXICAM 7.5 MG PO TABS: 7.5000 mg | ORAL_TABLET | Freq: Every day | ORAL | Status: DC

## 2015-09-22 ENCOUNTER — Other Ambulatory Visit: Payer: Self-pay | Admitting: Family Medicine

## 2015-09-23 ENCOUNTER — Other Ambulatory Visit: Payer: Self-pay | Admitting: Family Medicine

## 2015-09-23 DIAGNOSIS — M810 Age-related osteoporosis without current pathological fracture: Secondary | ICD-10-CM | POA: Diagnosis not present

## 2015-09-23 DIAGNOSIS — R7301 Impaired fasting glucose: Secondary | ICD-10-CM | POA: Diagnosis not present

## 2015-09-23 DIAGNOSIS — E785 Hyperlipidemia, unspecified: Secondary | ICD-10-CM | POA: Diagnosis not present

## 2015-09-23 DIAGNOSIS — R7989 Other specified abnormal findings of blood chemistry: Secondary | ICD-10-CM | POA: Diagnosis not present

## 2015-09-23 DIAGNOSIS — I10 Essential (primary) hypertension: Secondary | ICD-10-CM | POA: Diagnosis not present

## 2015-09-23 LAB — COMPLETE METABOLIC PANEL WITH GFR
ALT: 20 U/L (ref 6–29)
AST: 25 U/L (ref 10–35)
Albumin: 4.4 g/dL (ref 3.6–5.1)
Alkaline Phosphatase: 56 U/L (ref 33–130)
BUN: 19 mg/dL (ref 7–25)
CO2: 27 mmol/L (ref 20–31)
Calcium: 10 mg/dL (ref 8.6–10.4)
Chloride: 105 mmol/L (ref 98–110)
Creat: 0.87 mg/dL (ref 0.60–0.93)
GFR, Est African American: 74 mL/min (ref >=60)
GFR, Est Non African American: 64 mL/min (ref >=60)
Glucose, Bld: 99 mg/dL (ref 65–99)
Potassium: 3.6 mmol/L (ref 3.5–5.3)
Sodium: 141 mmol/L (ref 135–146)
Total Bilirubin: 0.4 mg/dL (ref 0.2–1.2)
Total Protein: 7.2 g/dL (ref 6.1–8.1)

## 2015-09-23 LAB — LIPID PANEL
Cholesterol: 185 mg/dL (ref 125–200)
HDL: 63 mg/dL (ref >=46)
LDL Cholesterol: 100 mg/dL (ref <130)
Total CHOL/HDL Ratio: 2.9 Ratio (ref <=5.0)
Triglycerides: 109 mg/dL (ref <150)
VLDL: 22 mg/dL (ref <30)

## 2015-09-23 LAB — TSH: TSH: 6.24 mIU/L — ABNORMAL HIGH

## 2015-09-24 LAB — VITAMIN D 25 HYDROXY (VIT D DEFICIENCY, FRACTURES): Vit D, 25-Hydroxy: 32 ng/mL (ref 30–100)

## 2015-09-24 LAB — T3, FREE: T3, Free: 2.7 pg/mL (ref 2.3–4.2)

## 2015-09-24 LAB — T4, FREE: Free T4: 1 ng/dL (ref 0.8–1.8)

## 2015-09-24 LAB — HEMOGLOBIN A1C
Hgb A1c MFr Bld: 6.2 % — ABNORMAL HIGH (ref <5.7)
Mean Plasma Glucose: 131 mg/dL

## 2015-09-27 ENCOUNTER — Encounter: Payer: Self-pay | Admitting: Family Medicine

## 2015-09-27 ENCOUNTER — Other Ambulatory Visit (HOSPITAL_COMMUNITY)
Admission: RE | Admit: 2015-09-27 | Discharge: 2015-09-27 | Disposition: A | Discharge status: Home / Self Care | Payer: Medicare Other | Source: Ambulatory Visit | Attending: Family Medicine | Admitting: Family Medicine

## 2015-09-27 ENCOUNTER — Ambulatory Visit (INDEPENDENT_AMBULATORY_CARE_PROVIDER_SITE_OTHER): Payer: Medicare Other | Admitting: Family Medicine

## 2015-09-27 ENCOUNTER — Encounter (INDEPENDENT_AMBULATORY_CARE_PROVIDER_SITE_OTHER): Payer: Self-pay

## 2015-09-27 VITALS — BP 138/80 | HR 93 | Resp 16 | Ht 63.0 in | Wt 159.0 lb

## 2015-09-27 DIAGNOSIS — F329 Major depressive disorder, single episode, unspecified: Secondary | ICD-10-CM

## 2015-09-27 DIAGNOSIS — E785 Hyperlipidemia, unspecified: Secondary | ICD-10-CM

## 2015-09-27 DIAGNOSIS — I10 Essential (primary) hypertension: Secondary | ICD-10-CM

## 2015-09-27 DIAGNOSIS — Z1211 Encounter for screening for malignant neoplasm of colon: Secondary | ICD-10-CM

## 2015-09-27 DIAGNOSIS — F32A Depression, unspecified: Secondary | ICD-10-CM

## 2015-09-27 DIAGNOSIS — N760 Acute vaginitis: Secondary | ICD-10-CM | POA: Insufficient documentation

## 2015-09-27 DIAGNOSIS — R52 Pain, unspecified: Secondary | ICD-10-CM

## 2015-09-27 DIAGNOSIS — R7989 Other specified abnormal findings of blood chemistry: Secondary | ICD-10-CM

## 2015-09-27 LAB — HEMOCCULT GUIAC POC 1CARD (OFFICE): Fecal Occult Blood, POC: NEGATIVE

## 2015-09-27 MED ORDER — TRAZODONE HCL 150 MG PO TABS
150.0000 mg | ORAL_TABLET | Freq: Every day | ORAL | 1 refills | Status: DC
Start: 2015-09-27 — End: 2016-05-18

## 2015-09-27 MED ORDER — NIACIN ER (ANTIHYPERLIPIDEMIC) 1000 MG PO TBCR: EXTENDED_RELEASE_TABLET | ORAL | 4 refills | Status: DC

## 2015-09-27 MED ORDER — FLUOXETINE HCL 20 MG PO TABS: 20.0000 mg | ORAL_TABLET | Freq: Every day | ORAL | 3 refills | Status: DC

## 2015-09-27 NOTE — Patient Instructions (Signed)
F/u in 8 weeks, call if you need me before  You need to get an ultrasound of your thyroid gland, you may need to start medication, gland is underactive  New medication fopr depression , fluoxetine is sent in  Rectal exam today

## 2015-09-27 NOTE — Assessment & Plan Note (Signed)
C/o malodorous d/c will treat based on results, self collected specimen sent

## 2015-09-27 NOTE — Assessment & Plan Note (Addendum)
Not suicidal or homicidal, add SSRI and re evaluate in 8 weeks

## 2015-09-27 NOTE — Assessment & Plan Note (Signed)
Chronic and unchanged, no change in manament

## 2015-09-27 NOTE — Assessment & Plan Note (Signed)
Low normal levels and elevated TSH, US thyroid then start low dose replacement

## 2015-09-27 NOTE — Progress Notes (Signed)
   Andrea Santiago     MRN: [id number]      DOB: 1940-04-27   HPI Andrea Santiago is here for follow up and re-evaluation of chronic medical conditions, medication management and review of any available recent lab and radiology data.  Preventive health is updated, specifically  Cancer screening and Immunization.   Questions or concerns regarding consultations or procedures which the PT has had in the interim are  addressed. The PT denies any adverse reactions to current medications since the last visit.  3 day h/o cough with yellow sputum, but increased maxillary pressure x 2 weeks, on allergy med Increased depression  ROS Denies recent fever or chills. Denies ear pain or  sore throat. Denies chest congestion, productive cough or wheezing. Denies chest pains, palpitations and leg swelling Denies abdominal pain, nausea, vomiting,diarrhea or constipation.   Denies dysuria, frequency, hesitancy or incontinence.c/o malod swelling and limitation in mobility. Denies headaches, seizures, numbness, or tingling.  Denies skin break down or rash.   PE  BP 138/80   Pulse 93   Resp 16   Ht 5\' 3"  (1.6 m)   Wt 159 lb (72.1 kg)   SpO2 97%   BMI 28.17 kg/m   Patient alert and oriented and in no cardiopulmonary distress.  HEENT: No facial asymmetry, EOMI,   oropharynx pink and moist.  Neck supple no JVD, no mass.maxillary  sinus tenderness, erythema and edema of nasal mucosa  Chest: Clear to auscultation bilaterally.  CVS: S1, S2 no murmurs, no S3.Regular rate.  ABD: Soft non tender. Rectal: no mass, heme negative stool  Pelvic : not done, has hysterectomy, pt self collected vaginal specimen for testing. Ext: No edema  MS: Decreased  ROM spine,adequate in shoulders, hips and knees.  Skin: Intact, no ulcerations or rash noted.  Psych: Good eye contact, normal affect. Memory intact depressed appearing.  CNS: CN 2-12 intact, power,  normal throughout.no focal deficits noted.   Assessment  & Plan Abnormal TSH Low normal levels and elevated TSH, US thyroid then start low dose replacement  Depression Not suicidal or homicidal, add SSRI and re evaluate in 8 weeks  Essential hypertension Controlled, no change in medication DASH diet and commitment to daily physical activity for a minimum of 30 minutes discussed and encouraged, as a part of hypertension management. The importance of attaining a healthy weight is also discussed.  BP/Weight 09/27/2015 07/09/2015 06/23/2015 06/21/2015 06/15/2015 05/28/2015 123XX123  Systolic BP 0000000 123XX123 [id number] - Q000111Q - [id number]  Diastolic BP 80 70 55 - 57 - 72  Wt. (Lbs) 159 162 - 165 165.1 160 161.12  BMI 28.17 28.7 - 28.31 28.33 26.63 28.55       Hyperlipemia Hyperlipidemia:Low fat diet discussed and encouraged.   Lipid Panel  Lab Results  Component Value Date   CHOL 185 09/23/2015   HDL 63 09/23/2015   LDLCALC 100 09/23/2015   LDLDIRECT 141 (H) 10/31/2007   TRIG 109 09/23/2015   CHOLHDL 2.9 09/23/2015     Controlled, no change in medication    Generalized pain Chronic and unchanged, no change in manament  Vaginitis and vulvovaginitis C/o malodorous d/c will treat based on results, self collected specimen sent

## 2015-09-27 NOTE — Assessment & Plan Note (Signed)
Controlled, no change in medication DASH diet and commitment to daily physical activity for a minimum of 30 minutes discussed and encouraged, as a part of hypertension management. The importance of attaining a healthy weight is also discussed.  BP/Weight 09/27/2015 07/09/2015 06/23/2015 06/21/2015 06/15/2015 05/28/2015 [id number]  Systolic BP 0000000 123XX123 [id number] - Q000111Q - [id number]  Diastolic BP 80 70 55 - 57 - 72  Wt. (Lbs) 159 162 - 165 165.1 160 161.12  BMI 28.17 28.7 - 28.31 28.33 26.63 28.55

## 2015-09-27 NOTE — Assessment & Plan Note (Signed)
Hyperlipidemia:Low fat diet discussed and encouraged.   Lipid Panel  Lab Results  Component Value Date   CHOL 185 09/23/2015   HDL 63 09/23/2015   LDLCALC 100 09/23/2015   LDLDIRECT 141 (H) 10/31/2007   TRIG 109 09/23/2015   CHOLHDL 2.9 09/23/2015     Controlled, no change in medication

## 2015-09-28 LAB — CERVICOVAGINAL ANCILLARY ONLY: Wet Prep (BD Affirm): NEGATIVE

## 2015-10-05 ENCOUNTER — Ambulatory Visit (HOSPITAL_COMMUNITY)
Admission: RE | Admit: 2015-10-05 | Discharge: 2015-10-05 | Disposition: A | Discharge status: Home / Self Care | Payer: Medicare Other | Source: Ambulatory Visit | Attending: Family Medicine | Admitting: Family Medicine

## 2015-10-05 DIAGNOSIS — E042 Nontoxic multinodular goiter: Secondary | ICD-10-CM | POA: Diagnosis not present

## 2015-10-05 DIAGNOSIS — R7989 Other specified abnormal findings of blood chemistry: Secondary | ICD-10-CM | POA: Diagnosis not present

## 2015-10-05 DIAGNOSIS — E039 Hypothyroidism, unspecified: Secondary | ICD-10-CM | POA: Diagnosis not present

## 2015-10-06 ENCOUNTER — Telehealth: Payer: Self-pay | Admitting: Family Medicine

## 2015-10-06 NOTE — Telephone Encounter (Signed)
Returned patient's call and left message for her to call office.

## 2015-10-06 NOTE — Telephone Encounter (Signed)
Andrea Santiago, Andrea Santiago left a message on the answering machine that she was returning your call to get xray results, please advise?

## 2015-10-07 ENCOUNTER — Other Ambulatory Visit: Payer: Self-pay | Admitting: Family Medicine

## 2015-10-07 ENCOUNTER — Telehealth: Payer: Self-pay | Admitting: Family Medicine

## 2015-10-07 DIAGNOSIS — R7989 Other specified abnormal findings of blood chemistry: Secondary | ICD-10-CM

## 2015-10-07 DIAGNOSIS — E042 Nontoxic multinodular goiter: Secondary | ICD-10-CM

## 2015-10-07 NOTE — Telephone Encounter (Signed)
Andrea Santiago is calling asking for results of her Thyroid U/S, please advise?

## 2015-10-07 NOTE — Progress Notes (Signed)
amb endo  

## 2015-10-07 NOTE — Telephone Encounter (Signed)
Patient aware.

## 2015-10-21 ENCOUNTER — Other Ambulatory Visit: Payer: Self-pay | Admitting: Family Medicine

## 2015-11-22 ENCOUNTER — Ambulatory Visit: Payer: Medicare Other | Admitting: Family Medicine

## 2015-11-23 ENCOUNTER — Encounter: Payer: Self-pay | Admitting: "Endocrinology

## 2015-11-23 ENCOUNTER — Ambulatory Visit (INDEPENDENT_AMBULATORY_CARE_PROVIDER_SITE_OTHER): Payer: Medicare Other | Admitting: "Endocrinology

## 2015-11-23 VITALS — BP 143/80 | HR 69 | Ht 63.0 in | Wt 153.0 lb

## 2015-11-23 DIAGNOSIS — E042 Nontoxic multinodular goiter: Secondary | ICD-10-CM

## 2015-11-23 DIAGNOSIS — E038 Other specified hypothyroidism: Secondary | ICD-10-CM | POA: Diagnosis not present

## 2015-11-23 DIAGNOSIS — E039 Hypothyroidism, unspecified: Secondary | ICD-10-CM | POA: Insufficient documentation

## 2015-11-23 MED ORDER — LEVOTHYROXINE SODIUM 50 MCG PO TABS: 50.0000 ug | ORAL_TABLET | Freq: Every day | ORAL | 2 refills | Status: DC
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
## 2015-11-30 ENCOUNTER — Other Ambulatory Visit: Payer: Self-pay

## 2015-11-30 MED ORDER — TIZANIDINE HCL 4 MG PO TABS: 4.0000 mg | ORAL_TABLET | Freq: Three times a day (TID) | ORAL | 0 refills | Status: DC

## 2015-12-06 ENCOUNTER — Telehealth: Payer: Self-pay | Admitting: "Endocrinology

## 2015-12-06 NOTE — Telephone Encounter (Signed)
Left message for pt to call us back. 

## 2015-12-06 NOTE — Telephone Encounter (Signed)
The 50 mcg is not a big dose, but I agree she can start low and advance as necessary. She can cut the pill in half and take 25 mcg every morning before breakfast.

## 2015-12-06 NOTE — Telephone Encounter (Signed)
Patient states that since starting her thyroid medication, her stomach feels "nervous and jumpy" all of the time. She is unable to eat anything after her breakfast. She wonders if this dose is too strong for her and needs to be adjusted? She has not taken it since Saturday. Please advise patient.

## 2015-12-07 NOTE — Telephone Encounter (Signed)
Pt.notified

## 2015-12-13 ENCOUNTER — Encounter: Payer: Self-pay | Admitting: Family Medicine

## 2015-12-13 ENCOUNTER — Ambulatory Visit (INDEPENDENT_AMBULATORY_CARE_PROVIDER_SITE_OTHER): Payer: Medicare Other | Admitting: Family Medicine

## 2015-12-13 VITALS — BP 114/68 | HR 83 | Ht 63.0 in | Wt 156.1 lb

## 2015-12-13 DIAGNOSIS — E038 Other specified hypothyroidism: Secondary | ICD-10-CM

## 2015-12-13 DIAGNOSIS — E782 Mixed hyperlipidemia: Secondary | ICD-10-CM

## 2015-12-13 DIAGNOSIS — M5431 Sciatica, right side: Secondary | ICD-10-CM

## 2015-12-13 DIAGNOSIS — R7301 Impaired fasting glucose: Secondary | ICD-10-CM

## 2015-12-13 DIAGNOSIS — I10 Essential (primary) hypertension: Secondary | ICD-10-CM

## 2015-12-13 DIAGNOSIS — E663 Overweight: Secondary | ICD-10-CM | POA: Diagnosis not present

## 2015-12-13 DIAGNOSIS — E559 Vitamin D deficiency, unspecified: Secondary | ICD-10-CM

## 2015-12-13 DIAGNOSIS — Z23 Encounter for immunization: Secondary | ICD-10-CM

## 2015-12-13 DIAGNOSIS — F3289 Other specified depressive episodes: Secondary | ICD-10-CM

## 2015-12-13 DIAGNOSIS — M797 Fibromyalgia: Secondary | ICD-10-CM

## 2015-12-13 NOTE — Patient Instructions (Signed)
Annual wellness Feb 4 or after, call if you need me before  F/U with MD end March/ early April  Flu vaccine today  Right leg pain is due to arthritis, call for referral to pain clinic if you need this   Continue to keep active, protect your arms when cutting bushes  CBC, fasting lipid, cmp , VIt D  For March visit  Recent labs to be sent to Dr Estanislado Pandy

## 2015-12-18 DIAGNOSIS — M797 Fibromyalgia: Secondary | ICD-10-CM | POA: Insufficient documentation

## 2015-12-18 NOTE — Assessment & Plan Note (Signed)
Controlled, no change in medication DASH diet and commitment to daily physical activity for a minimum of 30 minutes discussed and encouraged, as a part of hypertension management. The importance of attaining a healthy weight is also discussed.  BP/Weight 12/13/2015 11/23/2015 09/27/2015 07/09/2015 06/23/2015 06/21/2015 123XX123  Systolic BP 99991111 [id number] 0000000 [id number] 99991111 - Q000111Q  Diastolic BP 68 80 80 70 55 - 57  Wt. (Lbs) 156.12 153 159 162 - 165 165.1  BMI 27.66 27.1 28.17 28.7 - 28.31 28.33

## 2015-12-18 NOTE — Assessment & Plan Note (Signed)
After obtaining informed consent, the vaccine is  administered by LPN.  

## 2015-12-18 NOTE — Assessment & Plan Note (Signed)
Improved. Pt applauded on succesful weight loss through lifestyle change, and encouraged to continue same. Weight loss goal set for the next several months.  

## 2015-12-18 NOTE — Assessment & Plan Note (Signed)
Recent increase in pain but no interest in epidural currently

## 2015-12-18 NOTE — Assessment & Plan Note (Signed)
Hyperlipidemia:Low fat diet discussed and encouraged.   Lipid Panel  Lab Results  Component Value Date   CHOL 185 09/23/2015   HDL 63 09/23/2015   LDLCALC 100 09/23/2015   LDLDIRECT 141 (H) 10/31/2007   TRIG 109 09/23/2015   CHOLHDL 2.9 09/23/2015    Controlled, no change in medication

## 2015-12-18 NOTE — Assessment & Plan Note (Signed)
Controlled, no change in medication  

## 2015-12-18 NOTE — Assessment & Plan Note (Signed)
Currently being managed by endo, now on synthroid

## 2015-12-18 NOTE — Progress Notes (Signed)
Andrea Santiago     MRN: [id number]      DOB: [birth year]   HPI Andrea Santiago is here for follow up and re-evaluation of chronic medical conditions, medication management and review of any available recent lab and radiology data.  Preventive health is updated, specifically  Cancer screening and Immunization.   Questions or concerns regarding consultations or procedures which the PT has had in the interim are  Addressed.Recently started seeing endo re hypothyroidism, c/o some s/e from medication but is working with it The PT denies any adverse reactions to current medications since the last visit.  Increased back and RLE pain in past month, no interest in referral for epidural which she has benefited from in the past, nor in IM injections today   ROS Denies recent fever or chills. Denies sinus pressure, nasal congestion, ear pain or sore throat. Denies chest congestion, productive cough or wheezing. Denies chest pains, palpitations and leg swelling Denies abdominal pain, nausea, vomiting,diarrhea or constipation.   Denies dysuria, frequency, hesitancy or incontinence. . Denies headaches, seizures, numbness, or tingling. Denies depression, anxiety or insomnia. Denies skin break down or rash.   PE  BP 114/68   Pulse 83   Ht 5\' 3"  (1.6 m)   Wt 156 lb 1.9 oz (70.8 kg)   SpO2 97%   BMI 27.66 kg/m   Patient alert and oriented and in no cardiopulmonary distress.  HEENT: No facial asymmetry, EOMI,   oropharynx pink and moist.  Neck supple no JVD, no mass.  Chest: Clear to auscultation bilaterally.  CVS: S1, S2 no murmurs, no S3.Regular rate.  ABD: Soft non tender.   Ext: No edema  MS: Decreased  ROM spine, adequate in  shoulders, hips and knees.  Skin: Intact, no ulcerations or rash noted.  Psych: Good eye contact, normal affect. Memory intact not anxious or depressed appearing.  CNS: CN 2-12 intact, power,  normal throughout.no focal deficits noted.   Assessment &  Plan  Essential hypertension Controlled, no change in medication DASH diet and commitment to daily physical activity for a minimum of 30 minutes discussed and encouraged, as a part of hypertension management. The importance of attaining a healthy weight is also discussed.  BP/Weight 12/13/2015 11/23/2015 09/27/2015 07/09/2015 06/23/2015 06/21/2015 123XX123  Systolic BP 99991111 A999333 0000000 123XX123 99991111 - Q000111Q  Diastolic BP 68 80 80 70 55 - 57  Wt. (Lbs) 156.12 153 159 162 - 165 165.1  BMI 27.66 27.1 28.17 28.7 - 28.31 28.33       Overweight Improved. Pt applauded on succesful weight loss through lifestyle change, and encouraged to continue same. Weight loss goal set for the next several months.   Sciatica of right side Recent increase in pain but no interest in epidural currently  Other specified hypothyroidism Currently being managed by endo, now on synthroid  Hyperlipemia Hyperlipidemia:Low fat diet discussed and encouraged.   Lipid Panel  Lab Results  Component Value Date   CHOL 185 09/23/2015   HDL 63 09/23/2015   LDLCALC 100 09/23/2015   LDLDIRECT 141 (H) 10/31/2007   TRIG 109 09/23/2015   CHOLHDL 2.9 09/23/2015    Controlled, no change in medication    Depression Controlled, no change in medication   Primary fibromyalgia syndrome Managed by rheumatology, currently stable, pt keeps active and is also working on weight reduction successfully to reduce stress on joints  Impaired fasting glucose Patient educated about the importance of limiting  Carbohydrate intake , the need to commit  to daily physical activity for a minimum of 30 minutes , and to commit weight loss. The fact that changes in all these areas will reduce or eliminate all together the development of diabetes is stressed.  Updated lab needed at/ before next visit.   Diabetic Labs Latest Ref Rng & Units 09/23/2015 06/15/2015 03/22/2015 01/20/2015 08/17/2014  HbA1c <5.7 % 6.2(H) - 6.1(H) - 6.3(H)  Chol 125 - 200  mg/dL 185 - 175 - 182  HDL >=46 mg/dL 63 - 49 - 51  Calc LDL <130 mg/dL 100 - 96 - 101(H)  Triglycerides <150 mg/dL 109 - 151(H) - 152(H)  Creatinine 0.60 - 0.93 mg/dL 0.87 0.80 0.87 0.75 1.04   BP/Weight 12/13/2015 11/23/2015 09/27/2015 07/09/2015 06/23/2015 06/21/2015 [id number]  Systolic BP 99991111 A999333 0000000 123XX123 99991111 - Q000111Q  Diastolic BP 68 80 80 70 55 - 57  Wt. (Lbs) 156.12 153 159 162 - 165 165.1  BMI 27.66 27.1 28.17 28.7 - 28.31 28.33   No flowsheet data found.    Need for prophylactic vaccination and inoculation against influenza After obtaining informed consent, the vaccine is  administered by LPN.

## 2015-12-18 NOTE — Assessment & Plan Note (Signed)
Patient educated about the importance of limiting  Carbohydrate intake , the need to commit to daily physical activity for a minimum of 30 minutes , and to commit weight loss. The fact that changes in all these areas will reduce or eliminate all together the development of diabetes is stressed.  Updated lab needed at/ before next visit.   Diabetic Labs Latest Ref Rng & Units 09/23/2015 06/15/2015 03/22/2015 01/20/2015 08/17/2014  HbA1c <5.7 % 6.2(H) - 6.1(H) - 6.3(H)  Chol 125 - 200 mg/dL 185 - 175 - 182  HDL >=46 mg/dL 63 - 49 - 51  Calc LDL <130 mg/dL 100 - 96 - 101(H)  Triglycerides <150 mg/dL 109 - 151(H) - 152(H)  Creatinine 0.60 - 0.93 mg/dL 0.87 0.80 0.87 0.75 1.04   BP/Weight 12/13/2015 11/23/2015 09/27/2015 07/09/2015 06/23/2015 06/21/2015 [id number]  Systolic BP 99991111 A999333 0000000 123XX123 99991111 - Q000111Q  Diastolic BP 68 80 80 70 55 - 57  Wt. (Lbs) 156.12 153 159 162 - 165 165.1  BMI 27.66 27.1 28.17 28.7 - 28.31 28.33   No flowsheet data found.

## 2015-12-18 NOTE — Assessment & Plan Note (Signed)
Managed by rheumatology, currently stable, pt keeps active and is also working on weight reduction successfully to reduce stress on joints

## 2015-12-27 ENCOUNTER — Other Ambulatory Visit: Payer: Self-pay | Admitting: Gastroenterology

## 2016-01-20 ENCOUNTER — Other Ambulatory Visit: Payer: Self-pay | Admitting: Family Medicine

## 2016-01-22 ENCOUNTER — Other Ambulatory Visit: Payer: Self-pay | Admitting: Family Medicine

## 2016-02-05 ENCOUNTER — Other Ambulatory Visit: Payer: Self-pay | Admitting: Rheumatology

## 2016-02-07 ENCOUNTER — Telehealth: Payer: Self-pay | Admitting: Rheumatology

## 2016-02-07 NOTE — Telephone Encounter (Signed)
LMOM for patient to call back to schedule appointment.  

## 2016-02-07 NOTE — Telephone Encounter (Signed)
Last Visit: 11/01/15 Next visit due March 2018. Message sent to the front to schedule patient.    Okay to refill Topiramate?

## 2016-02-07 NOTE — Telephone Encounter (Signed)
ok 

## 2016-02-07 NOTE — Telephone Encounter (Signed)
-----   Message from Carole Binning, LPN sent at [phone]  8:49 AM EST ----- Regarding: Please schedule patient for follow up visit Please schedule patient for follow up visit. Patient due for appointment March 2018.

## 2016-02-15 ENCOUNTER — Other Ambulatory Visit: Payer: Self-pay | Admitting: "Endocrinology

## 2016-02-26 ENCOUNTER — Other Ambulatory Visit: Payer: Self-pay | Admitting: "Endocrinology

## 2016-02-26 DIAGNOSIS — E038 Other specified hypothyroidism: Secondary | ICD-10-CM | POA: Diagnosis not present

## 2016-02-26 LAB — T4, FREE: Free T4: 1.1 ng/dL (ref 0.8–1.8)

## 2016-02-26 LAB — TSH: TSH: 2.33 mIU/L

## 2016-02-29 ENCOUNTER — Ambulatory Visit: Payer: Medicare Other | Admitting: "Endocrinology

## 2016-02-29 LAB — THYROID PEROXIDASE ANTIBODY: Thyroperoxidase Ab SerPl-aCnc: <1 IU/mL (ref <9)

## 2016-02-29 LAB — THYROGLOBULIN ANTIBODY: Thyroglobulin Ab: <1 IU/mL (ref <2)

## 2016-03-06 ENCOUNTER — Encounter: Payer: Self-pay | Admitting: "Endocrinology

## 2016-03-06 ENCOUNTER — Ambulatory Visit (INDEPENDENT_AMBULATORY_CARE_PROVIDER_SITE_OTHER): Payer: Medicare Other | Admitting: "Endocrinology

## 2016-03-06 VITALS — BP 138/81 | HR 69 | Ht 63.0 in | Wt 155.0 lb

## 2016-03-06 DIAGNOSIS — E042 Nontoxic multinodular goiter: Secondary | ICD-10-CM

## 2016-03-06 DIAGNOSIS — R7303 Prediabetes: Secondary | ICD-10-CM

## 2016-03-06 DIAGNOSIS — E038 Other specified hypothyroidism: Secondary | ICD-10-CM

## 2016-03-06 MED ORDER — LEVOTHYROXINE SODIUM 75 MCG PO TABS: 75.0000 ug | ORAL_TABLET | Freq: Every day | ORAL | 3 refills | Status: DC
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
## 2016-03-17 ENCOUNTER — Other Ambulatory Visit: Payer: Self-pay | Admitting: Family Medicine

## 2016-03-19 ENCOUNTER — Other Ambulatory Visit: Payer: Self-pay | Admitting: Family Medicine

## 2016-03-28 ENCOUNTER — Ambulatory Visit (INDEPENDENT_AMBULATORY_CARE_PROVIDER_SITE_OTHER): Payer: Medicare Other

## 2016-03-28 VITALS — BP 112/62 | HR 78 | Temp 98.3°F | Ht 63.0 in | Wt 156.0 lb

## 2016-03-28 DIAGNOSIS — R7303 Prediabetes: Secondary | ICD-10-CM | POA: Diagnosis not present

## 2016-03-28 DIAGNOSIS — Z0000 Encounter for general adult medical examination without abnormal findings: Secondary | ICD-10-CM

## 2016-03-28 DIAGNOSIS — E782 Mixed hyperlipidemia: Secondary | ICD-10-CM | POA: Diagnosis not present

## 2016-03-28 DIAGNOSIS — I10 Essential (primary) hypertension: Secondary | ICD-10-CM | POA: Diagnosis not present

## 2016-03-28 DIAGNOSIS — E559 Vitamin D deficiency, unspecified: Secondary | ICD-10-CM
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
## 2016-03-28 NOTE — Patient Instructions (Addendum)
Health maintenance: Overdue for your A1C. Please have this done as soon as possible.   Abnormal screenings: None   Patient concerns: Migraine headaches, patient is requesting something as needed for her migraines. Recommend follow up with Dr. Moshe Cipro.    Next PCP appt: Follow up with Dr. Moshe Cipro in 6 months for a rectal exam. Lab work has been ordered for you today, please have this completed as soon as possible. Follow up in 1 year for your annual wellness visit.    Preventive Care 78 Years and Older, Female Preventive care refers to lifestyle choices and visits with your health care provider that can promote health and wellness. What does preventive care include?  A yearly physical exam. This is also called an annual well check.  Dental exams once or twice a year.  Routine eye exams. Ask your health care provider how often you should have your eyes checked.  Personal lifestyle choices, including:  Daily care of your teeth and gums.  Regular physical activity.  Eating a healthy diet.  Avoiding tobacco and drug use.  Limiting alcohol use.  Taking low-dose aspirin every day.  Taking vitamin and mineral supplements as recommended by your health care provider. What happens during an annual well check? The services and screenings done by your health care provider during your annual well check will depend on your age, overall health, lifestyle risk factors, and family history of disease. Counseling  Your health care provider may ask you questions about your:  Alcohol use.  Tobacco use.  Drug use.  Emotional well-being.  Home and relationship well-being.  Sexual activity.  Eating habits.  History of falls.  Memory and ability to understand (cognition).  Work and work Statistician.  Reproductive health. Screening  You may have the following tests or measurements:  Height, weight, and BMI.  Blood pressure.  Lipid and cholesterol levels. These may be  checked every 5 years, or more frequently if you are over 78 years old.  Skin check.  Lung cancer screening. You may have this screening every year starting at age 78 if you have a 30-pack-year history of smoking and currently smoke or have quit within the past 15 years.  Fecal occult blood test (FOBT) of the stool. You may have this test every year starting at age 78.  Flexible sigmoidoscopy or colonoscopy. You may have a sigmoidoscopy every 5 years or a colonoscopy every 10 years starting at age 78.  Hepatitis C blood test.  Diabetes screening. This is done by checking your blood sugar (glucose) after you have not eaten for a while (fasting). You may have this done every 1-3 years.  Bone density scan. This is done to screen for osteoporosis. You may have this done starting at age 78.  Mammogram. This may be done every 1-2 years. Talk to your health care provider about how often you should have regular mammograms. Talk with your health care provider about your test results, treatment options, and if necessary, the need for more tests. Vaccines  Your health care provider may recommend certain vaccines, such as:  Influenza vaccine. This is recommended every year.  Tetanus, diphtheria, and acellular pertussis (Tdap, Td) vaccine. You may need a Td booster every 10 years.  Zoster vaccine. You may need this after age 86.  Pneumococcal 13-valent conjugate (PCV13) vaccine. One dose is recommended after age 78.  Pneumococcal polysaccharide (PPSV23) vaccine. One dose is recommended after age 78.  Talk to your health care provider about which screenings and  vaccines you need and how often you need them. This information is not intended to replace advice given to you by your health care provider. Make sure you discuss any questions you have with your health care provider. Document Released: 03/05/2015 Document Revised: 10/27/2015 Document Reviewed: 12/08/2014 Elsevier Interactive Patient  Education  2017 [employer].

## 2016-03-29 DIAGNOSIS — E559 Vitamin D deficiency, unspecified: Secondary | ICD-10-CM | POA: Diagnosis not present

## 2016-03-29 DIAGNOSIS — I10 Essential (primary) hypertension: Secondary | ICD-10-CM | POA: Diagnosis not present

## 2016-03-29 DIAGNOSIS — R7303 Prediabetes: Secondary | ICD-10-CM | POA: Diagnosis not present

## 2016-03-29 DIAGNOSIS — E782 Mixed hyperlipidemia: Secondary | ICD-10-CM | POA: Diagnosis not present

## 2016-03-29 LAB — COMPREHENSIVE METABOLIC PANEL
ALT: 15 U/L (ref 6–29)
AST: 21 U/L (ref 10–35)
Albumin: 4.3 g/dL (ref 3.6–5.1)
Alkaline Phosphatase: 54 U/L (ref 33–130)
BUN: 17 mg/dL (ref 7–25)
CO2: 24 mmol/L (ref 20–31)
Calcium: 10 mg/dL (ref 8.6–10.4)
Chloride: 105 mmol/L (ref 98–110)
Creat: 0.9 mg/dL (ref 0.60–0.93)
Glucose, Bld: 106 mg/dL — ABNORMAL HIGH (ref 65–99)
Potassium: 3.6 mmol/L (ref 3.5–5.3)
Sodium: 142 mmol/L (ref 135–146)
Total Bilirubin: 0.6 mg/dL (ref 0.2–1.2)
Total Protein: 7.4 g/dL (ref 6.1–8.1)

## 2016-03-29 LAB — LIPID PANEL
Cholesterol: 255 mg/dL — ABNORMAL HIGH (ref <200)
HDL: 47 mg/dL — ABNORMAL LOW (ref >50)
LDL Cholesterol: 159 mg/dL — ABNORMAL HIGH (ref <100)
Total CHOL/HDL Ratio: 5.4 Ratio — ABNORMAL HIGH (ref <5.0)
Triglycerides: 243 mg/dL — ABNORMAL HIGH (ref <150)
VLDL: 49 mg/dL — ABNORMAL HIGH (ref <30)

## 2016-03-30 LAB — HEMOGLOBIN A1C
Hgb A1c MFr Bld: 5.7 % — ABNORMAL HIGH (ref <5.7)
Mean Plasma Glucose: 117 mg/dL

## 2016-03-30 LAB — VITAMIN D 25 HYDROXY (VIT D DEFICIENCY, FRACTURES): Vit D, 25-Hydroxy: 28 ng/mL — ABNORMAL LOW (ref 30–100)

## 2016-04-01 ENCOUNTER — Other Ambulatory Visit: Payer: Self-pay | Admitting: Family Medicine

## 2016-04-06 ENCOUNTER — Ambulatory Visit (INDEPENDENT_AMBULATORY_CARE_PROVIDER_SITE_OTHER): Payer: Medicare Other | Admitting: Family Medicine

## 2016-04-06 ENCOUNTER — Encounter: Payer: Self-pay | Admitting: Family Medicine

## 2016-04-06 VITALS — BP 122/60 | HR 87 | Resp 18 | Ht 63.0 in | Wt 150.0 lb

## 2016-04-06 DIAGNOSIS — E7849 Other hyperlipidemia: Secondary | ICD-10-CM

## 2016-04-06 DIAGNOSIS — E663 Overweight: Secondary | ICD-10-CM | POA: Diagnosis not present

## 2016-04-06 DIAGNOSIS — R51 Headache: Secondary | ICD-10-CM | POA: Diagnosis not present

## 2016-04-06 DIAGNOSIS — E784 Other hyperlipidemia: Secondary | ICD-10-CM | POA: Diagnosis not present

## 2016-04-06 DIAGNOSIS — I10 Essential (primary) hypertension: Secondary | ICD-10-CM | POA: Diagnosis not present

## 2016-04-06 DIAGNOSIS — R519 Headache, unspecified: Secondary | ICD-10-CM

## 2016-04-06 MED ORDER — TOPIRAMATE 25 MG PO TABS: 25.0000 mg | ORAL_TABLET | Freq: Two times a day (BID) | ORAL | 2 refills | Status: DC

## 2016-04-06 MED ORDER — ONDANSETRON HCL 4 MG PO TABS: ORAL_TABLET | ORAL | 0 refills | Status: DC

## 2016-04-06 MED ORDER — BUTALBITAL-APAP-CAFFEINE 50-325-40 MG PO TABS: ORAL_TABLET | ORAL | 0 refills | Status: DC

## 2016-04-06 NOTE — Patient Instructions (Signed)
F/u as before  Medication is sent for headache and you are referred to neurologist  pLEASE stop cheese and eat mainly vegetable and fruit fresh or frozen  Startuing today increase topamax to  One twice daily  Thanks for choosing [facility], we consider it a privelige to serve you.

## 2016-04-06 NOTE — Progress Notes (Signed)
   Andrea Santiago     MRN: [id number]      DOB: May 24, 1940   HPI Andrea Santiago is here with a 3 month h/o recurrent migraine headaches occuring on a daily basis ,they  start at back of head and extend to occiput, associated with nausea States has had h/o abnormal circulation in  vessels in brain also   ROS Denies recent fever or chills. Denies sinus pressure, nasal congestion, ear pain or sore throat. Denies chest congestion, productive cough or wheezing. Denies chest pains, palpitations and leg swelling Denies abdominal pain, nausea, vomiting,diarrhea or constipation.   Denies dysuria, frequency, hesitancy or incontinence. Denies uncontrolled  joint pain, swelling and limitation in mobility. Denies skin break down or rash.   PE  BP 122/60   Pulse 87   Resp 18   Ht 5\' 3"  (1.6 m)   Wt 150 lb (68 kg)   SpO2 97%   BMI 26.57 kg/m   Patient alert and oriented and in no cardiopulmonary distress. Patient uncomfortable in pain HEENT: No facial asymmetry, EOMI,   oropharynx pink and moist.  Neck decreased ROM no JVD, no mass.  Chest: Clear to auscultation bilaterally.  CVS: S1, S2 no murmurs, no S3.Regular rate.  ABD: Soft non tender.   Ext: No edema  MS: decreased ROM spine, shoulders, hips and knees.  Skin: Intact, no ulcerations or rash noted.  Psych: Good eye contact, normal affect. Memory intact not anxious or depressed appearing.  CNS: CN 2-12 intact, power,  normal throughout.no focal deficits noted.   Assessment & Plan  Headache disorder New onset disabling headache, increase topamax dose and limited amount of fioricet prescribed for use with tylenol to abort headache. Zofran prescribed for nausea. Refer to neurology as h/o abnormal circulation on imaging and the resurgence of her headaches is intense as far as frequency and severity are concerned  Hyperlipemia Uncontrolled Hyperlipidemia:Low fat diet discussed and encouraged.   Lipid Panel  Lab Results    Component Value Date   CHOL 255 (H) 03/28/2016   HDL 47 (L) 03/28/2016   LDLCALC 159 (H) 03/28/2016   LDLDIRECT 141 (H) 10/31/2007   TRIG 243 (H) 03/28/2016   CHOLHDL 5.4 (H) 03/28/2016   No med change Updated lab needed at/ before next visit.     Essential hypertension Controlled, no change in medication DASH diet and commitment to daily physical activity for a minimum of 30 minutes discussed and encouraged, as a part of hypertension management. The importance of attaining a healthy weight is also discussed.  BP/Weight 04/06/2016 03/28/2016 03/06/2016 12/13/2015 11/23/2015 09/27/2015 123456  Systolic BP 123XX123 [id number] 0000000 [id number] 0000000 123XX123  Diastolic BP 60 62 81 68 80 80 70  Wt. (Lbs) 150 156.04 155 156.12 153 159 162  BMI 26.57 27.64 27.46 27.66 27.1 28.17 28.7       Overweight Improved Patient re-educated about  the importance of commitment to a  minimum of 150 minutes of exercise per week.  The importance of healthy food choices with portion control discussed. Encouraged to start a food diary, count calories and to consider  joining a support group. Sample diet sheets offered. Goals set by the patient for the next several months.   Weight /BMI 04/06/2016 03/28/2016 03/06/2016  WEIGHT 150 lb 156 lb 0.6 oz 155 lb  HEIGHT 5\' 3"  5\' 3"  5\' 3"   BMI 26.57 kg/m2 27.64 kg/m2 27.46 kg/m2

## 2016-04-06 NOTE — Assessment & Plan Note (Addendum)
New onset disabling headache, increase topamax dose and limited amount of fioricet prescribed for use with tylenol to abort headache. Zofran prescribed for nausea. Refer to neurology as h/o abnormal circulation on imaging and the resurgence of her headaches is intense as far as frequency and severity are concerned

## 2016-04-07 NOTE — Assessment & Plan Note (Signed)
Controlled, no change in medication DASH diet and commitment to daily physical activity for a minimum of 30 minutes discussed and encouraged, as a part of hypertension management. The importance of attaining a healthy weight is also discussed.  BP/Weight 04/06/2016 03/28/2016 03/06/2016 12/13/2015 11/23/2015 09/27/2015 123456  Systolic BP 123XX123 [id number] 0000000 [id number] A999333 0000000 123XX123  Diastolic BP 60 62 81 68 80 80 70  Wt. (Lbs) 150 156.04 155 156.12 153 159 162  BMI 26.57 27.64 27.46 27.66 27.1 28.17 28.7

## 2016-04-07 NOTE — Assessment & Plan Note (Signed)
Uncontrolled Hyperlipidemia:Low fat diet discussed and encouraged.   Lipid Panel  Lab Results  Component Value Date   CHOL 255 (H) 03/28/2016   HDL 47 (L) 03/28/2016   LDLCALC 159 (H) 03/28/2016   LDLDIRECT 141 (H) 10/31/2007   TRIG 243 (H) 03/28/2016   CHOLHDL 5.4 (H) 03/28/2016   No med change Updated lab needed at/ before next visit.

## 2016-04-07 NOTE — Assessment & Plan Note (Signed)
Improved Patient re-educated about  the importance of commitment to a  minimum of 150 minutes of exercise per week.  The importance of healthy food choices with portion control discussed. Encouraged to start a food diary, count calories and to consider  joining a support group. Sample diet sheets offered. Goals set by the patient for the next several months.   Weight /BMI 04/06/2016 03/28/2016 03/06/2016  WEIGHT 150 lb 156 lb 0.6 oz 155 lb  HEIGHT 5\' 3"  5\' 3"  5\' 3"   BMI 26.57 kg/m2 27.64 kg/m2 27.46 kg/m2

## 2016-04-17 ENCOUNTER — Other Ambulatory Visit: Payer: Self-pay | Admitting: Gastroenterology

## 2016-04-24 ENCOUNTER — Other Ambulatory Visit: Payer: Self-pay

## 2016-04-24 DIAGNOSIS — G43019 Migraine without aura, intractable, without status migrainosus: Secondary | ICD-10-CM | POA: Diagnosis not present

## 2016-04-24 DIAGNOSIS — Z049 Encounter for examination and observation for unspecified reason: Secondary | ICD-10-CM | POA: Diagnosis not present

## 2016-04-24 MED ORDER — FLUOXETINE HCL 20 MG PO CAPS: 20.0000 mg | ORAL_CAPSULE | Freq: Every day | ORAL | 1 refills | Status: DC

## 2016-05-03 DIAGNOSIS — G518 Other disorders of facial nerve: Secondary | ICD-10-CM | POA: Diagnosis not present

## 2016-05-03 DIAGNOSIS — M791 Myalgia: Secondary | ICD-10-CM | POA: Diagnosis not present

## 2016-05-03 DIAGNOSIS — G43019 Migraine without aura, intractable, without status migrainosus: Secondary | ICD-10-CM | POA: Diagnosis not present

## 2016-05-03 DIAGNOSIS — M542 Cervicalgia: Secondary | ICD-10-CM | POA: Diagnosis not present

## 2016-05-03 DIAGNOSIS — R51 Headache: Secondary | ICD-10-CM | POA: Diagnosis not present

## 2016-05-07 ENCOUNTER — Other Ambulatory Visit: Payer: Self-pay | Admitting: Family Medicine

## 2016-05-09 ENCOUNTER — Ambulatory Visit: Payer: Medicare Other | Admitting: Family Medicine

## 2016-05-11 DIAGNOSIS — M19072 Primary osteoarthritis, left ankle and foot: Secondary | ICD-10-CM

## 2016-05-11 DIAGNOSIS — M47812 Spondylosis without myelopathy or radiculopathy, cervical region: Secondary | ICD-10-CM | POA: Insufficient documentation

## 2016-05-11 DIAGNOSIS — M19041 Primary osteoarthritis, right hand: Secondary | ICD-10-CM | POA: Insufficient documentation

## 2016-05-11 DIAGNOSIS — M47816 Spondylosis without myelopathy or radiculopathy, lumbar region: Secondary | ICD-10-CM | POA: Insufficient documentation

## 2016-05-11 DIAGNOSIS — M19042 Primary osteoarthritis, left hand: Secondary | ICD-10-CM

## 2016-05-11 DIAGNOSIS — M170 Bilateral primary osteoarthritis of knee: Secondary | ICD-10-CM | POA: Insufficient documentation

## 2016-05-11 DIAGNOSIS — M19071 Primary osteoarthritis, right ankle and foot: Secondary | ICD-10-CM | POA: Insufficient documentation
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
## 2016-05-17 DIAGNOSIS — M542 Cervicalgia: Secondary | ICD-10-CM | POA: Diagnosis not present

## 2016-05-17 DIAGNOSIS — G518 Other disorders of facial nerve: Secondary | ICD-10-CM | POA: Diagnosis not present

## 2016-05-17 DIAGNOSIS — M791 Myalgia: Secondary | ICD-10-CM | POA: Diagnosis not present

## 2016-05-17 DIAGNOSIS — R51 Headache: Secondary | ICD-10-CM | POA: Diagnosis not present

## 2016-05-17 DIAGNOSIS — G43019 Migraine without aura, intractable, without status migrainosus: Secondary | ICD-10-CM | POA: Diagnosis not present

## 2016-05-18 ENCOUNTER — Other Ambulatory Visit: Payer: Self-pay | Admitting: Family Medicine

## 2016-05-23 ENCOUNTER — Ambulatory Visit: Payer: Medicare Other | Admitting: Rheumatology

## 2016-05-24 ENCOUNTER — Ambulatory Visit: Payer: Medicare Other | Admitting: Rheumatology
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
## 2016-05-25 DIAGNOSIS — E559 Vitamin D deficiency, unspecified: Secondary | ICD-10-CM | POA: Insufficient documentation

## 2016-05-29 ENCOUNTER — Ambulatory Visit (INDEPENDENT_AMBULATORY_CARE_PROVIDER_SITE_OTHER): Payer: Medicare Other | Admitting: Rheumatology

## 2016-05-29 ENCOUNTER — Encounter: Payer: Self-pay | Admitting: Rheumatology

## 2016-05-29 VITALS — BP 153/79 | HR 87 | Resp 13 | Ht 64.0 in | Wt 159.0 lb

## 2016-05-29 DIAGNOSIS — M5030 Other cervical disc degeneration, unspecified cervical region: Secondary | ICD-10-CM

## 2016-05-29 DIAGNOSIS — M47812 Spondylosis without myelopathy or radiculopathy, cervical region: Secondary | ICD-10-CM

## 2016-05-29 DIAGNOSIS — F3289 Other specified depressive episodes: Secondary | ICD-10-CM | POA: Diagnosis not present

## 2016-05-29 DIAGNOSIS — M170 Bilateral primary osteoarthritis of knee: Secondary | ICD-10-CM

## 2016-05-29 DIAGNOSIS — M25511 Pain in right shoulder: Secondary | ICD-10-CM

## 2016-05-29 DIAGNOSIS — M25512 Pain in left shoulder: Secondary | ICD-10-CM

## 2016-05-29 DIAGNOSIS — M19042 Primary osteoarthritis, left hand: Secondary | ICD-10-CM

## 2016-05-29 DIAGNOSIS — G8929 Other chronic pain: Secondary | ICD-10-CM | POA: Diagnosis not present

## 2016-05-29 DIAGNOSIS — M47816 Spondylosis without myelopathy or radiculopathy, lumbar region: Secondary | ICD-10-CM

## 2016-05-29 DIAGNOSIS — E559 Vitamin D deficiency, unspecified: Secondary | ICD-10-CM

## 2016-05-29 DIAGNOSIS — M797 Fibromyalgia: Secondary | ICD-10-CM | POA: Diagnosis not present

## 2016-05-29 DIAGNOSIS — M8589 Other specified disorders of bone density and structure, multiple sites: Secondary | ICD-10-CM | POA: Diagnosis not present

## 2016-05-29 DIAGNOSIS — M19041 Primary osteoarthritis, right hand: Secondary | ICD-10-CM

## 2016-05-29 DIAGNOSIS — R5383 Other fatigue: Secondary | ICD-10-CM | POA: Diagnosis not present

## 2016-05-29 DIAGNOSIS — M19071 Primary osteoarthritis, right ankle and foot: Secondary | ICD-10-CM | POA: Diagnosis not present

## 2016-05-29 DIAGNOSIS — M19072 Primary osteoarthritis, left ankle and foot: Secondary | ICD-10-CM

## 2016-05-29 MED ORDER — VOLTAREN 1 % TD GEL: 2.0000 g | Freq: Three times a day (TID) | TRANSDERMAL | 3 refills | Status: DC | PRN

## 2016-05-30 ENCOUNTER — Ambulatory Visit: Payer: Medicare Other | Admitting: Family Medicine

## 2016-05-31 ENCOUNTER — Other Ambulatory Visit: Payer: Self-pay | Admitting: Family Medicine

## 2016-06-08 ENCOUNTER — Telehealth: Payer: Self-pay | Admitting: Pharmacist

## 2016-06-08 NOTE — Telephone Encounter (Signed)
I spoke to patient.  She confirms she is taking meloxicam 7.5 mg daily.  I advised her against the use of Voltaren gel with meloxicam.  Patient voiced understanding.     Elisabeth Most, Pharm.D., BCPS, CPP Clinical Pharmacist Pager: [phone] Phone: [phone] 06/08/2016 4:59 PM

## 2016-06-08 NOTE — Telephone Encounter (Signed)
Received letter from [employer] regarding possible therapeutic duplication (meloxicam and Voltaren gel).  Called patient to discuss.  Left a message asking her to call me back.    Elisabeth Most, Pharm.D., BCPS, CPP Clinical Pharmacist Pager: [phone] Phone: [phone] 06/08/2016 4:46 PM

## 2016-06-15 DIAGNOSIS — M542 Cervicalgia: Secondary | ICD-10-CM | POA: Diagnosis not present

## 2016-06-15 DIAGNOSIS — G43019 Migraine without aura, intractable, without status migrainosus: Secondary | ICD-10-CM | POA: Diagnosis not present

## 2016-06-15 DIAGNOSIS — G518 Other disorders of facial nerve: Secondary | ICD-10-CM | POA: Diagnosis not present

## 2016-06-15 DIAGNOSIS — R51 Headache: Secondary | ICD-10-CM | POA: Diagnosis not present

## 2016-06-15 DIAGNOSIS — M791 Myalgia: Secondary | ICD-10-CM | POA: Diagnosis not present

## 2016-06-27 ENCOUNTER — Other Ambulatory Visit: Payer: Self-pay | Admitting: Family Medicine

## 2016-06-30 DIAGNOSIS — G518 Other disorders of facial nerve: Secondary | ICD-10-CM | POA: Diagnosis not present

## 2016-06-30 DIAGNOSIS — M542 Cervicalgia: Secondary | ICD-10-CM | POA: Diagnosis not present

## 2016-06-30 DIAGNOSIS — R51 Headache: Secondary | ICD-10-CM | POA: Diagnosis not present

## 2016-06-30 DIAGNOSIS — G43019 Migraine without aura, intractable, without status migrainosus: Secondary | ICD-10-CM | POA: Diagnosis not present

## 2016-06-30 DIAGNOSIS — M791 Myalgia: Secondary | ICD-10-CM | POA: Diagnosis not present

## 2016-07-04 DIAGNOSIS — H04123 Dry eye syndrome of bilateral lacrimal glands: Secondary | ICD-10-CM | POA: Diagnosis not present

## 2016-07-04 DIAGNOSIS — H02833 Dermatochalasis of right eye, unspecified eyelid: Secondary | ICD-10-CM | POA: Diagnosis not present

## 2016-07-04 DIAGNOSIS — H02836 Dermatochalasis of left eye, unspecified eyelid: Secondary | ICD-10-CM | POA: Diagnosis not present

## 2016-07-04 DIAGNOSIS — Z961 Presence of intraocular lens: Secondary | ICD-10-CM | POA: Diagnosis not present

## 2016-07-09 ENCOUNTER — Other Ambulatory Visit: Payer: Self-pay | Admitting: "Endocrinology

## 2016-07-10 DIAGNOSIS — H02423 Myogenic ptosis of bilateral eyelids: Secondary | ICD-10-CM | POA: Diagnosis not present

## 2016-07-10 DIAGNOSIS — H02413 Mechanical ptosis of bilateral eyelids: Secondary | ICD-10-CM | POA: Diagnosis not present

## 2016-07-10 DIAGNOSIS — H0279 Other degenerative disorders of eyelid and periocular area: Secondary | ICD-10-CM | POA: Diagnosis not present

## 2016-07-10 DIAGNOSIS — H02834 Dermatochalasis of left upper eyelid: Secondary | ICD-10-CM | POA: Diagnosis not present

## 2016-07-10 DIAGNOSIS — H04213 Epiphora due to excess lacrimation, bilateral lacrimal glands: Secondary | ICD-10-CM | POA: Diagnosis not present

## 2016-07-10 DIAGNOSIS — H11433 Conjunctival hyperemia, bilateral: Secondary | ICD-10-CM | POA: Diagnosis not present

## 2016-07-10 DIAGNOSIS — H02831 Dermatochalasis of right upper eyelid: Secondary | ICD-10-CM | POA: Diagnosis not present

## 2016-07-11 ENCOUNTER — Ambulatory Visit (INDEPENDENT_AMBULATORY_CARE_PROVIDER_SITE_OTHER): Payer: Medicare Other | Admitting: Family Medicine

## 2016-07-11 ENCOUNTER — Encounter: Payer: Self-pay | Admitting: Family Medicine

## 2016-07-11 VITALS — BP 132/70 | HR 95 | Resp 16 | Ht 64.0 in | Wt 157.0 lb

## 2016-07-11 DIAGNOSIS — R51 Headache: Secondary | ICD-10-CM

## 2016-07-11 DIAGNOSIS — R519 Headache, unspecified: Secondary | ICD-10-CM

## 2016-07-11 DIAGNOSIS — R7989 Other specified abnormal findings of blood chemistry: Secondary | ICD-10-CM

## 2016-07-11 DIAGNOSIS — E784 Other hyperlipidemia: Secondary | ICD-10-CM

## 2016-07-11 DIAGNOSIS — R946 Abnormal results of thyroid function studies: Secondary | ICD-10-CM | POA: Diagnosis not present

## 2016-07-11 DIAGNOSIS — M797 Fibromyalgia: Secondary | ICD-10-CM

## 2016-07-11 DIAGNOSIS — E038 Other specified hypothyroidism: Secondary | ICD-10-CM | POA: Diagnosis not present

## 2016-07-11 DIAGNOSIS — F320 Major depressive disorder, single episode, mild: Secondary | ICD-10-CM

## 2016-07-11 DIAGNOSIS — E7849 Other hyperlipidemia: Secondary | ICD-10-CM

## 2016-07-11 MED ORDER — FLUOXETINE HCL 40 MG PO CAPS: 40.0000 mg | ORAL_CAPSULE | Freq: Every day | ORAL | 2 refills | Status: DC

## 2016-07-11 NOTE — Patient Instructions (Addendum)
Fu in 6 weeks, call if you need me before,  Fasting lipid panel and cmp 1 week before follow up  Dose increase in fluoxetine to 40 mg   I will refer you to social worker  Thank you  for choosing [facility]. We consider it a privelige to serve you.  Delivering excellent health care in a caring and  compassionate way is our goal.  Partnering with you,  so that together we can achieve this goal is our strategy.

## 2016-07-11 NOTE — Assessment & Plan Note (Addendum)
Increase fluoxetine dose to 40 mg effective 06/2016 and review in 6 to 8 weeks. Social work consult also for any possible financial assistance  Pt is not suicidal or homicidal, she is involved in senior citizens group activities also she just has no close relationships

## 2016-07-17 ENCOUNTER — Encounter: Payer: Self-pay | Admitting: Family Medicine

## 2016-07-17 NOTE — Assessment & Plan Note (Signed)
Being treated by endo

## 2016-07-17 NOTE — Assessment & Plan Note (Signed)
Hyperlipidemia:Low fat diet discussed and encouraged.   Lipid Panel  Lab Results  Component Value Date   CHOL 255 (H) 03/28/2016   HDL 47 (L) 03/28/2016   LDLCALC 159 (H) 03/28/2016   LDLDIRECT 141 (H) 10/31/2007   TRIG 243 (H) 03/28/2016   CHOLHDL 5.4 (H) 03/28/2016  uncontrolled and statin intolerant   Updated lab needed at/ before next visit.

## 2016-07-17 NOTE — Assessment & Plan Note (Signed)
Adequately corrected and treated by endo

## 2016-07-17 NOTE — Assessment & Plan Note (Signed)
Controlled, no change in medication  

## 2016-07-17 NOTE — Progress Notes (Signed)
   Andrea Santiago     MRN: [id number]      DOB: 11-07-1940   HPI Andrea Santiago is here for follow up and re-evaluation of chronic medical conditions, medication management and review of any available recent lab and radiology data.  Preventive health is updated, specifically  Cancer screening and Immunization.   Questions or concerns regarding consultations or procedures which the PT has had in the interim are  addressed. The PT denies any adverse reactions to current medications since the last visit.  C/o increased social isolation and depression, feels betrayed by her "best friend"Has significant financial stress and gets no assistance from her on;ly child  Not suicidal or homicidal, but finding it increasingly difficult to "make ends meet  ROS Denies recent fever or chills. Denies sinus pressure, nasal congestion, ear pain or sore throat. Denies chest congestion, productive cough or wheezing. Denies chest pains, palpitations and leg swelling Denies abdominal pain, nausea, vomiting,diarrhea or constipation.   Denies dysuria, frequency, hesitancy or incontinence. Denies uncontrolled joint pain, swelling and limitation in mobility. Denies headaches, seizures, numbness, or tingling. Denies skin break down or rash.   PE  BP 132/70   Pulse 95   Resp 16   Ht 5\' 4"  (1.626 m)   Wt 157 lb (71.2 kg)   SpO2 95%   BMI 26.95 kg/m   Patient alert and oriented and in no cardiopulmonary distress.  HEENT: No facial asymmetry, EOMI,   oropharynx pink and moist.  Neck supple no JVD, no mass.  Chest: Clear to auscultation bilaterally.  CVS: S1, S2 no murmurs, no S3.Regular rate.  ABD: Soft non tender.   Ext: No edema  MS: Adequate  Though reduced ROM spine, shoulders, hips and knees.  Skin: Intact, no ulcerations or rash noted.  Psych: Good eye contact, flat  affect. Memory intact not anxious or depressed appearing.  CNS: CN 2-12 intact, power,  normal throughout.no focal deficits  noted.   Assessment & Plan  Depression Increase fluoxetine dose to 40 mg effective 06/2016 and review in 6 to 8 weeks. Social work consult also for any possible financial assistance  Pt is not suicidal or homicidal, she is involved in senior citizens group activities also she just has no close relationships  Hyperlipemia Hyperlipidemia:Low fat diet discussed and encouraged.   Lipid Panel  Lab Results  Component Value Date   CHOL 255 (H) 03/28/2016   HDL 47 (L) 03/28/2016   LDLCALC 159 (H) 03/28/2016   LDLDIRECT 141 (H) 10/31/2007   TRIG 243 (H) 03/28/2016   CHOLHDL 5.4 (H) 03/28/2016  uncontrolled and statin intolerant   Updated lab needed at/ before next visit.   Other specified hypothyroidism Being treated by endo  Headache disorder Controlled, no change in medication   Fibromyalgia Controlled, no change in medication   Abnormal TSH Adequately corrected and treated by endo

## 2016-07-19 ENCOUNTER — Other Ambulatory Visit: Payer: Self-pay | Admitting: Family Medicine

## 2016-07-25 ENCOUNTER — Telehealth: Payer: Self-pay

## 2016-07-25 NOTE — Telephone Encounter (Signed)
Left message for patient to call back to discuss need for community resources. -nr

## 2016-07-27 ENCOUNTER — Other Ambulatory Visit: Payer: Self-pay | Admitting: Licensed Clinical Social Worker

## 2016-07-27 ENCOUNTER — Other Ambulatory Visit: Payer: Self-pay | Admitting: *Deleted

## 2016-07-27 NOTE — Patient Outreach (Signed)
Assessment:  CSW Andrea Santiago is transferring [employer]. [place], DOB 09-14-2040, to [place] on 07/27/16.  Norva Riffle.Abednego Yeates MSW, LCSW Licensed Clinical Social Worker [facility] Care Management [phone]

## 2016-07-27 NOTE — Patient Outreach (Signed)
[place] [facility]) Care Management  07/27/2016  EMILA STEINHAUSER 04/01/1940 [id number]   CSW made an initial attempt to try and contact patient today to perform phone assessment, as well as assess and assist with social needs and services, without success. A HIPPA compliant message was left for patient on voicemail. CSW is currently awaiting a return call. CSW will make a second outreach attempt within the next week, if CSW does not receive a return call from patient in the meantime.    Raynaldo Opitz, LCSW [facility]  Clinical Social Worker cell #: [phone]

## 2016-08-01 ENCOUNTER — Other Ambulatory Visit: Payer: Self-pay | Admitting: *Deleted

## 2016-08-01 ENCOUNTER — Other Ambulatory Visit: Payer: Self-pay | Admitting: Gastroenterology

## 2016-08-01 NOTE — Patient Outreach (Signed)
[place] [facility]) Care Management  08/01/2016  Andrea Santiago [birth year] [id number]   CSW made an second attempt to try and contact patient today to perform phone assessment, as well as assess and assist with social needs and services, without success. A HIPPA compliant message was left for patient on voicemail, both home [phone]) and mobile [phone]). CSW is currently awaiting a return call. CSW will make a third outreach attempt within the next week, if CSW does not receive a return call from patient in the meantime.    Raynaldo Opitz, LCSW [facility]  Clinical Social Worker cell #: [phone]

## 2016-08-02 DIAGNOSIS — M542 Cervicalgia: Secondary | ICD-10-CM | POA: Diagnosis not present

## 2016-08-02 DIAGNOSIS — G43019 Migraine without aura, intractable, without status migrainosus: Secondary | ICD-10-CM | POA: Diagnosis not present

## 2016-08-02 DIAGNOSIS — R51 Headache: Secondary | ICD-10-CM | POA: Diagnosis not present

## 2016-08-02 DIAGNOSIS — G518 Other disorders of facial nerve: Secondary | ICD-10-CM | POA: Diagnosis not present

## 2016-08-02 DIAGNOSIS — M791 Myalgia: Secondary | ICD-10-CM | POA: Diagnosis not present

## 2016-08-03 ENCOUNTER — Other Ambulatory Visit: Payer: Self-pay

## 2016-08-03 NOTE — Patient Outreach (Signed)
[place] [facility]) Care Management  08/03/2016  EILLEEN DAVOLI Jan 18, 1941 [id number]  TELEPHONE SCREENING Referral date: 07/27/16 Referral source: Primary MD  Referral reason: assistance with financial resources.  Limited income. Needs assistance paying for utilities and food. Insurance: Medicare/ medicaid  Telephone call to patients listed home number and mobile number. Unable to reach patient. HIPAA compliant voice message left with call back phone number.     PLAN: RNCM will attempt 2nd telephone call to patient within 1 week.   Quinn Plowman RN,BSN,CCM [facility] Telephonic  [phone] '

## 2016-08-07 ENCOUNTER — Other Ambulatory Visit: Payer: Self-pay | Admitting: *Deleted

## 2016-08-07 NOTE — Patient Outreach (Signed)
[facility]) Care Management  08/07/2016  AHLAYA ENDE 1941/01/13 [id number]   CSW contacted patient by phone (home#: [phone]) to schedule an initial home visit. Patient informed CSW that she will be going on vacation next week but will be back Monday, July 2nd. CSW scheduled home visit for 1pm that day. Full assessment & note to follow.    Raynaldo Opitz, LCSW [facility]  Clinical Social Worker cell #: [phone]

## 2016-08-08 ENCOUNTER — Ambulatory Visit: Payer: Self-pay

## 2016-08-09 ENCOUNTER — Other Ambulatory Visit: Payer: Self-pay

## 2016-08-09 NOTE — Patient Outreach (Signed)
[place] [facility]) Care Management  08/09/2016  Andrea Santiago 1941-01-14 [id number]   TELEPHONE SCREENING Referral date: 07/27/16 Referral source: Primary MD  Referral reason: assistance with financial resources.  Limited income. Needs assistance paying for utilities and food. Insurance: Medicare/ medicaid Attempt #2  Telephone call to patients listed home number.  Unable to reach patient. HIPAA compliant voice message left with call back phone number.     PLAN: RNCM will attempt 3rd telephone call to patient within 1 week.   Quinn Plowman RN,BSN,CCM [facility] Telephonic  [phone]

## 2016-08-11 ENCOUNTER — Ambulatory Visit: Payer: Self-pay

## 2016-08-11 NOTE — Patient Outreach (Signed)
Request received from Theadore Nan, LCSW to mail patient personal care resources.  This information was mailed today.

## 2016-08-14 ENCOUNTER — Other Ambulatory Visit: Payer: Self-pay

## 2016-08-14 NOTE — Patient Outreach (Signed)
[place] [facility]) Care Management  08/14/2016  Andrea Santiago 01/07/1941 [id number]  TELEPHONE SCREENING Referral date: 07/27/16 Referral source: Primary MD  Referral reason: assistance with financial resources.  Limited income. Needs assistance paying for utilities and food. Insurance: Medicare/ medicaid Attempt #3  Third Telephone call to patients listed home number and mobile number. Unable to reach patient. HIPAA compliant voice message left with call back phone number.     PLAN: RNCM will send patient outreach letter to attempt contact.    Quinn Plowman RN,BSN,CCM [facility] Telephonic  [phone]

## 2016-08-21 ENCOUNTER — Other Ambulatory Visit: Payer: Self-pay | Admitting: *Deleted

## 2016-08-21 NOTE — Patient Outreach (Signed)
[place] [facility]) Care Management  08/21/2016  Andrea Santiago 1940-04-13 [id number]   CSW attempted to reach patient to confirm initial home visit this afternoon, but no answer - CSW left voicemail for patient to call back when available. CSW stopped by patient's home as CSW was in the area, but no answer when doorbell was rung twice. CSW left [facility] packet in mailbox and will follow-up via phone next week, unless patient returns call before then.    Raynaldo Opitz, LCSW [facility]  Clinical Social Worker cell #: [phone]

## 2016-08-22 ENCOUNTER — Ambulatory Visit: Payer: Medicare Other | Admitting: Family Medicine

## 2016-08-26 ENCOUNTER — Other Ambulatory Visit: Payer: Self-pay

## 2016-08-26 NOTE — Patient Outreach (Signed)
[place] [facility]) Care Management  08/26/2016  Andrea Santiago 12-04-1940 [id number]  No response from patient after 3 telephone calls and letter outreach.  PLAN: RNCM will notify care management assistant to close patient to telephonic nurse outreach. Contact with patient still being attempted by [facility] social worker [employer].   Quinn Plowman RN,BSN,CCM [facility] Telephonic  [phone]

## 2016-08-28 ENCOUNTER — Other Ambulatory Visit: Payer: Self-pay | Admitting: *Deleted

## 2016-08-28 ENCOUNTER — Other Ambulatory Visit: Payer: Self-pay | Admitting: "Endocrinology

## 2016-08-28 DIAGNOSIS — E038 Other specified hypothyroidism: Secondary | ICD-10-CM | POA: Diagnosis not present

## 2016-08-28 LAB — T4, FREE: Free T4: 1.6 ng/dL (ref 0.8–1.8)

## 2016-08-28 LAB — TSH: TSH: 0.29 mIU/L — ABNORMAL LOW

## 2016-08-28 NOTE — Patient Outreach (Signed)
[place] [facility]) Care Management  08/28/2016  MI BALLA [birth year] [id number]   CSW made a second attempt to try and contact patient today to perform phone assessment, as well as assess and assist with social needs and services, without success. A HIPPA compliant message was left for patient on voicemail (ph#: [phone]). CSW is currently awaiting a return call. CSW will send unsuccessful outreach letter to patient & make a third outreach attempt within the next week, if CSW does not receive a return call from patient in the meantime.    Raynaldo Opitz, LCSW [facility]  Clinical Social Worker cell #: [phone]

## 2016-09-01 ENCOUNTER — Other Ambulatory Visit: Payer: Self-pay | Admitting: *Deleted

## 2016-09-01 NOTE — Patient Outreach (Signed)
[place] [facility]) Care Management  09/01/2016  ORLINDA SLOMSKI April 28, 1940 [id number]   CSW attempted for a second time to reach patient for initial assessment. CSW left voicemail on patient's home #: [phone]. CSW will await call back from patient or will try back next week.    Raynaldo Opitz, LCSW [facility]  Clinical Social Worker cell #: [phone]

## 2016-09-01 NOTE — Patient Outreach (Signed)
[place] [facility]) Care Management  09/01/2016  Andrea Santiago April 08, 1940 [id number]   CSW received call back from patient stating that she needed resources to help with her utilities. CSW provided had provided patient with list of resources along with [facility] packet when CSW attempted to complete initial home visit & patient was not home. CSW reminded patient that information was left for her. Patient confirmed that list was left in packet and that she plans to call around to resources [employer], [place]). Patient states that she had attempted to get assistance from [employer] earlier in the year but that they would need a letter from her utility company that her power was going to get turned off. Patient states that she has not received a letter from them as of yet. CSW will close case/sign off as no further CSW needs identified.    Raynaldo Opitz, LCSW [facility]  Clinical Social Worker cell #: [phone]

## 2016-09-04 ENCOUNTER — Ambulatory Visit (INDEPENDENT_AMBULATORY_CARE_PROVIDER_SITE_OTHER): Payer: Medicare Other | Admitting: "Endocrinology

## 2016-09-04 ENCOUNTER — Encounter: Payer: Self-pay | Admitting: "Endocrinology

## 2016-09-04 VITALS — BP 125/76 | HR 85 | Ht 64.0 in | Wt 157.0 lb

## 2016-09-04 DIAGNOSIS — R7303 Prediabetes: Secondary | ICD-10-CM

## 2016-09-04 DIAGNOSIS — E038 Other specified hypothyroidism: Secondary | ICD-10-CM | POA: Diagnosis not present

## 2016-09-04 DIAGNOSIS — E042 Nontoxic multinodular goiter: Secondary | ICD-10-CM | POA: Diagnosis not present
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
## 2016-09-06 ENCOUNTER — Other Ambulatory Visit: Payer: Self-pay | Admitting: *Deleted

## 2016-09-16 ENCOUNTER — Other Ambulatory Visit: Payer: Self-pay | Admitting: Family Medicine

## 2016-09-19 DIAGNOSIS — R51 Headache: Secondary | ICD-10-CM | POA: Diagnosis not present

## 2016-09-19 DIAGNOSIS — M791 Myalgia: Secondary | ICD-10-CM | POA: Diagnosis not present

## 2016-09-19 DIAGNOSIS — G518 Other disorders of facial nerve: Secondary | ICD-10-CM | POA: Diagnosis not present

## 2016-09-19 DIAGNOSIS — M542 Cervicalgia: Secondary | ICD-10-CM | POA: Diagnosis not present

## 2016-09-19 DIAGNOSIS — G43019 Migraine without aura, intractable, without status migrainosus: Secondary | ICD-10-CM | POA: Diagnosis not present

## 2016-09-20 ENCOUNTER — Ambulatory Visit: Payer: Medicare Other

## 2016-09-20 ENCOUNTER — Telehealth: Payer: Self-pay | Admitting: Family Medicine

## 2016-09-20 DIAGNOSIS — F339 Major depressive disorder, recurrent, unspecified: Secondary | ICD-10-CM

## 2016-09-20 NOTE — Telephone Encounter (Signed)
Patient informed of message below, verbalized understanding. Informed that we close from 12-1 and that she can come before or after, but to be here before 4.

## 2016-09-20 NOTE — Telephone Encounter (Signed)
Call pt and arrange for her to come in for telepsych today so she can be hooked up with therapy asap please

## 2016-09-20 NOTE — Telephone Encounter (Signed)
Patient called and left message on nurse line. She states that the last time she was here, Dr. Moshe Cipro asked her if she needed to talk to anyone. She states now she does. She asks that we put in a referral and someone call her with an appointment. Callback# [phone]

## 2016-09-23 DIAGNOSIS — E784 Other hyperlipidemia: Secondary | ICD-10-CM | POA: Diagnosis not present

## 2016-09-24 ENCOUNTER — Other Ambulatory Visit: Payer: Self-pay | Admitting: Family Medicine

## 2016-09-24 LAB — COMPREHENSIVE METABOLIC PANEL
ALT: 15 U/L (ref 6–29)
AST: 17 U/L (ref 10–35)
Albumin: 3.9 g/dL (ref 3.6–5.1)
Alkaline Phosphatase: 56 U/L (ref 33–130)
BUN: 12 mg/dL (ref 7–25)
CO2: 22 mmol/L (ref 20–31)
Calcium: 9.5 mg/dL (ref 8.6–10.4)
Chloride: 106 mmol/L (ref 98–110)
Creat: 0.77 mg/dL (ref 0.60–0.93)
Glucose, Bld: 115 mg/dL — ABNORMAL HIGH (ref 65–99)
Potassium: 3.9 mmol/L (ref 3.5–5.3)
Sodium: 141 mmol/L (ref 135–146)
Total Bilirubin: 0.4 mg/dL (ref 0.2–1.2)
Total Protein: 6.9 g/dL (ref 6.1–8.1)

## 2016-09-24 LAB — LIPID PANEL
Cholesterol: 173 mg/dL (ref <200)
HDL: 47 mg/dL — ABNORMAL LOW (ref >50)
LDL Cholesterol: 91 mg/dL (ref <100)
Total CHOL/HDL Ratio: 3.7 Ratio (ref <5.0)
Triglycerides: 176 mg/dL — ABNORMAL HIGH (ref <150)
VLDL: 35 mg/dL — ABNORMAL HIGH (ref <30)

## 2016-09-25 ENCOUNTER — Other Ambulatory Visit: Payer: Self-pay | Admitting: Family Medicine

## 2016-09-25 ENCOUNTER — Telehealth: Payer: Self-pay | Admitting: Family Medicine

## 2016-09-25 ENCOUNTER — Ambulatory Visit: Payer: Medicare Other | Admitting: Family Medicine

## 2016-09-25 NOTE — Telephone Encounter (Signed)
Patient calling because she did not hear anything after telepsych encounter.  She stated that the person's name was Ava and Ava told her that she would call her the following day. Please advise as to how she is to reach this person.    cb  [phone]

## 2016-09-26 NOTE — Telephone Encounter (Signed)
Called and left message with Ava at [facility] contact information

## 2016-09-27 ENCOUNTER — Ambulatory Visit: Payer: Medicare Other | Admitting: Family Medicine

## 2016-10-09 ENCOUNTER — Ambulatory Visit: Payer: Medicare Other | Admitting: Family Medicine

## 2016-10-16 ENCOUNTER — Ambulatory Visit (INDEPENDENT_AMBULATORY_CARE_PROVIDER_SITE_OTHER): Payer: Medicare Other | Admitting: Family Medicine

## 2016-10-16 ENCOUNTER — Encounter: Payer: Self-pay | Admitting: Family Medicine

## 2016-10-16 ENCOUNTER — Ambulatory Visit (HOSPITAL_COMMUNITY)
Admission: RE | Admit: 2016-10-16 | Discharge: 2016-10-16 | Disposition: A | Discharge status: Home / Self Care | Payer: Medicare Other | Source: Ambulatory Visit | Attending: Family Medicine | Admitting: Family Medicine

## 2016-10-16 VITALS — BP 130/78 | HR 78 | Resp 16 | Ht 64.0 in | Wt 151.0 lb

## 2016-10-16 DIAGNOSIS — R0781 Pleurodynia: Secondary | ICD-10-CM | POA: Insufficient documentation

## 2016-10-16 DIAGNOSIS — Z1211 Encounter for screening for malignant neoplasm of colon: Secondary | ICD-10-CM

## 2016-10-16 DIAGNOSIS — Z23 Encounter for immunization: Secondary | ICD-10-CM | POA: Diagnosis not present

## 2016-10-16 DIAGNOSIS — E784 Other hyperlipidemia: Secondary | ICD-10-CM | POA: Diagnosis not present

## 2016-10-16 DIAGNOSIS — R7303 Prediabetes: Secondary | ICD-10-CM | POA: Diagnosis not present

## 2016-10-16 DIAGNOSIS — R51 Headache: Secondary | ICD-10-CM | POA: Diagnosis not present

## 2016-10-16 DIAGNOSIS — E7849 Other hyperlipidemia: Secondary | ICD-10-CM

## 2016-10-16 DIAGNOSIS — F329 Major depressive disorder, single episode, unspecified: Secondary | ICD-10-CM | POA: Diagnosis not present

## 2016-10-16 DIAGNOSIS — R519 Headache, unspecified: Secondary | ICD-10-CM

## 2016-10-16 DIAGNOSIS — E038 Other specified hypothyroidism: Secondary | ICD-10-CM

## 2016-10-16 DIAGNOSIS — R05 Cough: Secondary | ICD-10-CM | POA: Diagnosis not present

## 2016-10-16 LAB — POC HEMOCCULT BLD/STL (OFFICE/1-CARD/DIAGNOSTIC): Fecal Occult Blood, POC: NEGATIVE

## 2016-10-16 NOTE — Patient Instructions (Addendum)
Medicare wellness with nurse 3rd week in February, call if you need me before  MD f/u 2nd week in January   Flu vaccine today  Chem 7 and EGFR, hBA1C non fast and cBC 1 week before January visit  X ray of lung today with focus on rigthlower rib pain for 3 months  Thank you  for choosing [facility]. We consider it a privelige to serve you.  Delivering excellent health care in a caring and  compassionate way is our goal.  Partnering with you,  so that together we can achieve this goal is our strategy.

## 2016-10-17 ENCOUNTER — Encounter: Payer: Self-pay | Admitting: Family Medicine

## 2016-10-23 DIAGNOSIS — R0781 Pleurodynia: Secondary | ICD-10-CM | POA: Insufficient documentation

## 2016-10-23 NOTE — Assessment & Plan Note (Signed)
Still in need of therapist as well as psychiatric services, will continue current meds. Not suicidal or homicidal

## 2016-10-23 NOTE — Assessment & Plan Note (Signed)
By endo and controlled

## 2016-10-23 NOTE — Assessment & Plan Note (Signed)
Hyperlipidemia:Low fat diet discussed and encouraged.   Lipid Panel  Lab Results  Component Value Date   CHOL 173 09/23/2016   HDL 47 (L) 09/23/2016   LDLCALC 91 09/23/2016   LDLDIRECT 141 (H) 10/31/2007   TRIG 176 (H) 09/23/2016   CHOLHDL 3.7 09/23/2016   Elevated TG, needs to reduce fat intake

## 2016-10-23 NOTE — Progress Notes (Signed)
   Andrea Santiago     MRN: [id number]      DOB: 25-Mar-1940   HPI Andrea Santiago is here for follow up and re-evaluation of chronic medical conditions, medication management and review of any available recent lab and radiology data.  Preventive health is updated, specifically  Cancer screening and Immunization.   Still awaiting appointment with psychiatry, not suicidal or homicidal but often overwhelmes The PT denies any adverse reactions to current medications since the last visit.  C/o localized lower right rib pain with direct pressure, denies cough , fever, chills or sputum production   ROS Denies recent fever or chills. Denies sinus pressure, nasal congestion, ear pain or sore throat. Denies chest congestion, productive cough or wheezing. Denies chest pains, palpitations and leg swelling Denies abdominal pain, nausea, vomiting,diarrhea or constipation.   Denies dysuria, frequency, hesitancy or incontinence. Denies uncontrolled joint pain, swelling and limitation in mobility. Denies headaches, seizures, numbness, or tingling.  Denies skin break down or rash.   PE  BP 130/78   Pulse 78   Resp 16   Ht 5\' 4"  (1.626 m)   Wt 151 lb (68.5 kg)   SpO2 98%   BMI 25.92 kg/m   Patient alert and oriented and in no cardiopulmonary distress.  HEENT: No facial asymmetry, EOMI,   oropharynx pink and moist.  Neck supple no JVD, no mass.  Chest: Clear to auscultation bilaterally.Tender over right lower rib  CVS: S1, S2 no murmurs, no S3.Regular rate.  ABD: Soft non tender.  Rectal: no mass, heme negative stool Ext: No edema  MS: decreased  ROM spine, shoulders, hips and knees.  Skin: Intact, no ulcerations or rash noted.  Psych: Good eye contact, normal affect. Memory intact not anxious or depressed appearing.  CNS: CN 2-12 intact, power,  normal throughout.no focal deficits noted.   Assessment & Plan  Rib pain on right side Localized right rib pain reproducible, X ray of chest  with focus on right lower rib  Other specified hypothyroidism By endo and controlled  Hyperlipemia Hyperlipidemia:Low fat diet discussed and encouraged.   Lipid Panel  Lab Results  Component Value Date   CHOL 173 09/23/2016   HDL 47 (L) 09/23/2016   LDLCALC 91 09/23/2016   LDLDIRECT 141 (H) 10/31/2007   TRIG 176 (H) 09/23/2016   CHOLHDL 3.7 09/23/2016   Elevated TG, needs to reduce fat intake    Headache disorder Controlled, no change in medication   Depression Still in need of therapist as well as psychiatric services, will continue current meds. Not suicidal or homicidal

## 2016-10-23 NOTE — Assessment & Plan Note (Signed)
Localized right rib pain reproducible, X ray of chest with focus on right lower rib

## 2016-10-23 NOTE — Assessment & Plan Note (Signed)
Controlled, no change in medication  

## 2016-11-15 ENCOUNTER — Other Ambulatory Visit: Payer: Self-pay | Admitting: Gastroenterology

## 2016-11-20 DIAGNOSIS — R51 Headache: Secondary | ICD-10-CM | POA: Diagnosis not present

## 2016-11-20 DIAGNOSIS — M791 Myalgia, unspecified site: Secondary | ICD-10-CM | POA: Diagnosis not present

## 2016-11-20 DIAGNOSIS — M542 Cervicalgia: Secondary | ICD-10-CM | POA: Diagnosis not present

## 2016-11-20 DIAGNOSIS — G518 Other disorders of facial nerve: Secondary | ICD-10-CM | POA: Diagnosis not present

## 2016-11-20 DIAGNOSIS — G43019 Migraine without aura, intractable, without status migrainosus: Secondary | ICD-10-CM | POA: Diagnosis not present
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
## 2016-11-22 ENCOUNTER — Other Ambulatory Visit: Payer: Self-pay | Admitting: Family Medicine

## 2016-11-25 ENCOUNTER — Other Ambulatory Visit: Payer: Self-pay | Admitting: "Endocrinology

## 2016-11-28 ENCOUNTER — Other Ambulatory Visit: Payer: Self-pay | Admitting: Family Medicine

## 2016-11-28 DIAGNOSIS — F339 Major depressive disorder, recurrent, unspecified: Secondary | ICD-10-CM

## 2016-11-28 NOTE — Progress Notes (Signed)
amb psych  

## 2016-11-29 ENCOUNTER — Encounter: Payer: Self-pay | Admitting: Rheumatology

## 2016-11-29 ENCOUNTER — Ambulatory Visit (INDEPENDENT_AMBULATORY_CARE_PROVIDER_SITE_OTHER): Payer: Medicare Other | Admitting: Rheumatology

## 2016-11-29 VITALS — BP 124/78 | HR 78 | Resp 14 | Ht 64.0 in | Wt 150.0 lb

## 2016-11-29 DIAGNOSIS — Z8659 Personal history of other mental and behavioral disorders: Secondary | ICD-10-CM

## 2016-11-29 DIAGNOSIS — M170 Bilateral primary osteoarthritis of knee: Secondary | ICD-10-CM

## 2016-11-29 DIAGNOSIS — M8589 Other specified disorders of bone density and structure, multiple sites: Secondary | ICD-10-CM

## 2016-11-29 DIAGNOSIS — E559 Vitamin D deficiency, unspecified: Secondary | ICD-10-CM

## 2016-11-29 DIAGNOSIS — M797 Fibromyalgia: Secondary | ICD-10-CM

## 2016-11-29 DIAGNOSIS — R5383 Other fatigue: Secondary | ICD-10-CM

## 2016-11-29 DIAGNOSIS — Z8639 Personal history of other endocrine, nutritional and metabolic disease: Secondary | ICD-10-CM

## 2016-11-29 DIAGNOSIS — M5136 Other intervertebral disc degeneration, lumbar region: Secondary | ICD-10-CM

## 2016-11-29 DIAGNOSIS — M19042 Primary osteoarthritis, left hand: Secondary | ICD-10-CM | POA: Diagnosis not present

## 2016-11-29 DIAGNOSIS — M19071 Primary osteoarthritis, right ankle and foot: Secondary | ICD-10-CM | POA: Diagnosis not present

## 2016-11-29 DIAGNOSIS — M19041 Primary osteoarthritis, right hand: Secondary | ICD-10-CM | POA: Diagnosis not present

## 2016-11-29 DIAGNOSIS — M19072 Primary osteoarthritis, left ankle and foot: Secondary | ICD-10-CM

## 2016-11-29 DIAGNOSIS — Z8679 Personal history of other diseases of the circulatory system: Secondary | ICD-10-CM | POA: Diagnosis not present

## 2016-11-29 MED ORDER — VOLTAREN 1 % TD GEL: 2.0000 g | Freq: Three times a day (TID) | TRANSDERMAL | 3 refills | Status: DC | PRN

## 2016-12-07 ENCOUNTER — Other Ambulatory Visit: Payer: Self-pay | Admitting: Family Medicine

## 2016-12-07 NOTE — Telephone Encounter (Signed)
Seen [phone] 

## 2016-12-21 ENCOUNTER — Other Ambulatory Visit: Payer: Self-pay | Admitting: Family Medicine

## 2017-01-08 DIAGNOSIS — M791 Myalgia, unspecified site: Secondary | ICD-10-CM | POA: Diagnosis not present

## 2017-01-08 DIAGNOSIS — G518 Other disorders of facial nerve: Secondary | ICD-10-CM | POA: Diagnosis not present

## 2017-01-08 DIAGNOSIS — G43019 Migraine without aura, intractable, without status migrainosus: Secondary | ICD-10-CM | POA: Diagnosis not present

## 2017-01-08 DIAGNOSIS — R51 Headache: Secondary | ICD-10-CM | POA: Diagnosis not present

## 2017-01-08 DIAGNOSIS — M542 Cervicalgia: Secondary | ICD-10-CM | POA: Diagnosis not present

## 2017-01-23 ENCOUNTER — Encounter: Payer: Self-pay | Admitting: Family Medicine

## 2017-01-25 ENCOUNTER — Other Ambulatory Visit: Payer: Self-pay | Admitting: Family Medicine

## 2017-02-09 ENCOUNTER — Other Ambulatory Visit: Payer: Self-pay | Admitting: Family Medicine

## 2017-02-19 ENCOUNTER — Other Ambulatory Visit: Payer: Self-pay | Admitting: Family Medicine

## 2017-02-25 ENCOUNTER — Encounter (HOSPITAL_COMMUNITY): Payer: Self-pay | Admitting: *Deleted

## 2017-02-25 ENCOUNTER — Emergency Department (HOSPITAL_COMMUNITY): Payer: Medicare Other

## 2017-02-25 ENCOUNTER — Other Ambulatory Visit: Payer: Self-pay

## 2017-02-25 ENCOUNTER — Emergency Department (HOSPITAL_COMMUNITY)
Admission: EM | Admit: 2017-02-25 | Discharge: 2017-02-25 | Disposition: A | Discharge status: Home / Self Care | Payer: Medicare Other | Attending: Emergency Medicine | Admitting: Emergency Medicine

## 2017-02-25 DIAGNOSIS — J449 Chronic obstructive pulmonary disease, unspecified: Secondary | ICD-10-CM | POA: Diagnosis not present

## 2017-02-25 DIAGNOSIS — Z87891 Personal history of nicotine dependence: Secondary | ICD-10-CM | POA: Insufficient documentation

## 2017-02-25 DIAGNOSIS — I2510 Atherosclerotic heart disease of native coronary artery without angina pectoris: Secondary | ICD-10-CM | POA: Insufficient documentation

## 2017-02-25 DIAGNOSIS — K5900 Constipation, unspecified: Secondary | ICD-10-CM | POA: Insufficient documentation

## 2017-02-25 DIAGNOSIS — I10 Essential (primary) hypertension: Secondary | ICD-10-CM | POA: Diagnosis not present

## 2017-02-25 DIAGNOSIS — Z79899 Other long term (current) drug therapy: Secondary | ICD-10-CM | POA: Diagnosis not present

## 2017-02-25 MED ORDER — ONDANSETRON 4 MG PO TBDP
4.0000 mg | ORAL_TABLET | Freq: Once | ORAL | Status: AC
  Administered 2017-02-25: 4 mg via ORAL
  Filled 2017-02-25: qty 1

## 2017-02-25 MED ORDER — DOCUSATE SODIUM 100 MG PO CAPS: 100.0000 mg | ORAL_CAPSULE | Freq: Two times a day (BID) | ORAL | 0 refills | Status: DC

## 2017-02-25 MED ORDER — HYDROCORTISONE 2.5 % RE CREA: 1.0000 "application " | TOPICAL_CREAM | Freq: Two times a day (BID) | RECTAL | 0 refills | Status: DC
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
## 2017-02-25 NOTE — ED Notes (Signed)
Instilled 1./3 of tap water enema Pt currently on bedpan

## 2017-02-25 NOTE — ED Triage Notes (Signed)
Pt states constipation since yesterday. Says it feels like its just hanging.

## 2017-02-25 NOTE — Discharge Instructions (Signed)
Follow up with your md if not improving. °

## 2017-02-25 NOTE — ED Notes (Signed)
Pt has returned small stool  More enema is instilled and pt has returned to bedside commode

## 2017-02-25 NOTE — ED Notes (Signed)
Instilled more tap water enema Pt does not tolerate well Currently sitting on bedside commode  She has returned two hard small stools thus far

## 2017-02-25 NOTE — ED Notes (Signed)
Return of small amt of stool

## 2017-02-25 NOTE — ED Notes (Signed)
Pt declines more enema Reports she desires to return to her home  She request meds for her painful hemorrhoids

## 2017-02-26 ENCOUNTER — Ambulatory Visit: Payer: Medicare Other | Admitting: Family Medicine

## 2017-04-05 DIAGNOSIS — R51 Headache: Secondary | ICD-10-CM | POA: Diagnosis not present

## 2017-04-05 DIAGNOSIS — G518 Other disorders of facial nerve: Secondary | ICD-10-CM | POA: Diagnosis not present

## 2017-04-05 DIAGNOSIS — M791 Myalgia, unspecified site: Secondary | ICD-10-CM | POA: Diagnosis not present

## 2017-04-05 DIAGNOSIS — M542 Cervicalgia: Secondary | ICD-10-CM | POA: Diagnosis not present

## 2017-04-05 DIAGNOSIS — G43019 Migraine without aura, intractable, without status migrainosus: Secondary | ICD-10-CM | POA: Diagnosis not present

## 2017-04-06 DIAGNOSIS — R7303 Prediabetes: Secondary | ICD-10-CM | POA: Diagnosis not present

## 2017-04-06 DIAGNOSIS — E7849 Other hyperlipidemia: Secondary | ICD-10-CM | POA: Diagnosis not present

## 2017-04-07 LAB — CBC
HCT: 36.9 % (ref 35.0–45.0)
Hemoglobin: 12.7 g/dL (ref 11.7–15.5)
MCH: 30.9 pg (ref 27.0–33.0)
MCHC: 34.4 g/dL (ref 32.0–36.0)
MCV: 89.8 fL (ref 80.0–100.0)
MPV: 9.7 fL (ref 7.5–12.5)
Platelets: 246 10*3/uL (ref 140–400)
RBC: 4.11 10*6/uL (ref 3.80–5.10)
RDW: 13.1 % (ref 11.0–15.0)
WBC: 19.1 10*3/uL — ABNORMAL HIGH (ref 3.8–10.8)

## 2017-04-07 LAB — BASIC METABOLIC PANEL WITH GFR
BUN: 25 mg/dL (ref 7–25)
CO2: 26 mmol/L (ref 20–32)
Calcium: 9.7 mg/dL (ref 8.6–10.4)
Chloride: 104 mmol/L (ref 98–110)
Creat: 0.73 mg/dL (ref 0.60–0.93)
GFR, Est African American: 91 mL/min/{1.73_m2} (ref >=60)
GFR, Est Non African American: 79 mL/min/{1.73_m2} (ref >=60)
Glucose, Bld: 94 mg/dL (ref 65–139)
Potassium: 3.8 mmol/L (ref 3.5–5.3)
Sodium: 139 mmol/L (ref 135–146)

## 2017-04-07 LAB — HEMOGLOBIN A1C
Hgb A1c MFr Bld: 6 % of total Hgb — ABNORMAL HIGH (ref <5.7)
Mean Plasma Glucose: 126 (calc)
eAG (mmol/L): 7 (calc)

## 2017-04-09 ENCOUNTER — Ambulatory Visit: Payer: Medicare Other

## 2017-04-17 ENCOUNTER — Encounter: Payer: Self-pay | Admitting: Gastroenterology

## 2017-04-17 ENCOUNTER — Telehealth: Payer: Self-pay | Admitting: Family Medicine

## 2017-04-17 NOTE — Telephone Encounter (Signed)
Please contact pt for appt. I called her and left a message which got cut off? On her mobile phone. She needs to bring meds. Recent lab is abnormal so she DOES need to come in

## 2017-04-18 NOTE — Telephone Encounter (Signed)
sch for 3-7 @ 10:40--if that is a problem please let me know

## 2017-04-18 NOTE — Telephone Encounter (Signed)
Patient left message on nurse line requesting appointment

## 2017-04-26 ENCOUNTER — Ambulatory Visit: Payer: Medicare Other | Admitting: Family Medicine

## 2017-04-30 ENCOUNTER — Encounter: Payer: Self-pay | Admitting: Family Medicine

## 2017-05-03 ENCOUNTER — Ambulatory Visit (INDEPENDENT_AMBULATORY_CARE_PROVIDER_SITE_OTHER): Payer: Medicare Other | Admitting: Family Medicine

## 2017-05-03 ENCOUNTER — Encounter: Payer: Self-pay | Admitting: Family Medicine

## 2017-05-03 VITALS — BP 140/68 | HR 96 | Resp 16 | Ht 64.0 in | Wt 161.0 lb

## 2017-05-03 DIAGNOSIS — R7303 Prediabetes: Secondary | ICD-10-CM

## 2017-05-03 DIAGNOSIS — R030 Elevated blood-pressure reading, without diagnosis of hypertension: Secondary | ICD-10-CM | POA: Diagnosis not present

## 2017-05-03 DIAGNOSIS — F322 Major depressive disorder, single episode, severe without psychotic features: Secondary | ICD-10-CM

## 2017-05-03 DIAGNOSIS — E7849 Other hyperlipidemia: Secondary | ICD-10-CM | POA: Diagnosis not present

## 2017-05-03 DIAGNOSIS — D72829 Elevated white blood cell count, unspecified: Secondary | ICD-10-CM

## 2017-05-03 DIAGNOSIS — R519 Headache, unspecified: Secondary | ICD-10-CM

## 2017-05-03 DIAGNOSIS — R51 Headache: Secondary | ICD-10-CM | POA: Diagnosis not present

## 2017-05-03 DIAGNOSIS — R7989 Other specified abnormal findings of blood chemistry: Secondary | ICD-10-CM

## 2017-05-03 NOTE — Patient Instructions (Addendum)
Wellness with nurse is past due please schedule  You are referred to psychiatrist for medication management      F/U with MD in 3 month  CBC today at hospital also hepatic panel;  Dx is elevated WBC

## 2017-05-04 ENCOUNTER — Telehealth: Payer: Self-pay

## 2017-05-04 NOTE — Telephone Encounter (Signed)
Please call patient about her labs  

## 2017-05-05 ENCOUNTER — Encounter: Payer: Self-pay | Admitting: Family Medicine

## 2017-05-05 DIAGNOSIS — I10 Essential (primary) hypertension: Secondary | ICD-10-CM | POA: Insufficient documentation

## 2017-05-05 DIAGNOSIS — D72829 Elevated white blood cell count, unspecified: Secondary | ICD-10-CM | POA: Diagnosis not present

## 2017-05-05 DIAGNOSIS — R030 Elevated blood-pressure reading, without diagnosis of hypertension: Secondary | ICD-10-CM | POA: Insufficient documentation

## 2017-05-05 DIAGNOSIS — F322 Major depressive disorder, single episode, severe without psychotic features: Secondary | ICD-10-CM | POA: Insufficient documentation

## 2017-05-05 LAB — HEPATIC FUNCTION PANEL
AG Ratio: 1.5 (calc) (ref 1.0–2.5)
ALT: 18 U/L (ref 6–29)
AST: 21 U/L (ref 10–35)
Albumin: 4.4 g/dL (ref 3.6–5.1)
Alkaline phosphatase (APISO): 52 U/L (ref 33–130)
Bilirubin, Direct: 0.1 mg/dL (ref 0.0–0.2)
Globulin: 2.9 g/dL (calc) (ref 1.9–3.7)
Indirect Bilirubin: 0.3 mg/dL (calc) (ref 0.2–1.2)
Total Bilirubin: 0.4 mg/dL (ref 0.2–1.2)
Total Protein: 7.3 g/dL (ref 6.1–8.1)

## 2017-05-05 LAB — CBC
HCT: 38 % (ref 35.0–45.0)
Hemoglobin: 13.2 g/dL (ref 11.7–15.5)
MCH: 31.1 pg (ref 27.0–33.0)
MCHC: 34.7 g/dL (ref 32.0–36.0)
MCV: 89.6 fL (ref 80.0–100.0)
MPV: 9.8 fL (ref 7.5–12.5)
Platelets: 254 10*3/uL (ref 140–400)
RBC: 4.24 10*6/uL (ref 3.80–5.10)
RDW: 13.2 % (ref 11.0–15.0)
WBC: 7.7 10*3/uL (ref 3.8–10.8)

## 2017-05-05 NOTE — Assessment & Plan Note (Signed)
Managed by neurology, reports poor control, mental health has negative effect on her chronic pain at this time

## 2017-05-05 NOTE — Progress Notes (Signed)
[name]     MRN: [id number]      DOB: July 26, 1940   HPI Andrea Santiago is here for follow up and re-evaluation of chronic medical conditions, medication management and review of any available recent labs. She is specifically brought in because of markedly elevated WBC of unclear cause, she denies any recent fever, and states she cannot recall being on steroids when labs were done Preventive health is updated, specifically  Cancer screening and Immunization.   Followed by rheumatology and neurology also sees a Counsellor States her headaches are uncontrolled and she remains extremely depressed despite the services of a good Counsellor she is not suicidal or homicidal but is not functioning at all well     ROS Denies recent fever or chills. Denies sinus pressure, nasal congestion, ear pain or sore throat. Denies chest congestion, productive cough or wheezing. Denies chest pains, palpitations and leg swelling Denies abdominal pain, nausea, vomiting,diarrhea or constipation.   Denies dysuria, frequency, hesitancy or incontinence. C/o chronic joint pain,  and limitation in mobility. . Denies skin break down or rash.   PE  BP 140/68   Pulse 96   Resp 16   Ht 5\' 4"  (1.626 m)   Wt 161 lb (73 kg)   SpO2 98%   BMI 27.64 kg/m   Patient alert and oriented and in no cardiopulmonary distress.  HEENT: No facial asymmetry, EOMI,   oropharynx pink and moist.  Neck supple no JVD, no mass.  Chest: Clear to auscultation bilaterally.  CVS: S1, S2 no murmurs, no S3.Regular rate.  ABD: Soft non tender.   Ext: No edema  MS: Decreased ROM spine, shoulders, hips and knees.  Skin: Intact, no ulcerations or rash noted.  Psych: Good eye contact, tearful  affect. Memory intact not anxious but  depressed appearing.  CNS: CN 2-12 intact, power,  normal throughout.no focal deficits noted.   Assessment & Plan  Depression, major, single episode, severe ([place]) Continue current medication  but needs to be treated by psychiatry, soonest appt  Pt very interested in getting the help she needs ,  Realizes she is not herself and wants to get better  Headache disorder Managed by neurology, reports poor control, mental health has negative effect on her chronic pain at this time  Elevated systolic blood pressure reading without diagnosis of hypertension DASH diet and commitment to daily physical activity for a minimum of 30 minutes discussed and encouraged, as a part of hypertension management. The importance of attaining a healthy weight is also discussed.  BP/Weight 05/03/2017 02/25/2017 11/29/2016 10/16/2016 09/04/2016 1/70/0174 10/24/4965  Systolic BP 591 638 466 599 357 017 793  Diastolic BP 68 70 78 78 76 70 79  Wt. (Lbs) 161 150 150 151 157 157 159  BMI 27.64 25.75 25.75 25.92 26.95 26.95 27.29   Noted to have 11 pound weight gain, will need additional medication if elevated BP persists    Abnormal TSH Treated by endo, weigt gain and mood abnormality noted  Hyperlipemia Hyperlipidemia:Low fat diet discussed and encouraged.   Lipid Panel  Lab Results  Component Value Date   CHOL 173 09/23/2016   HDL 47 (L) 09/23/2016   LDLCALC 91 09/23/2016   LDLDIRECT 141 (H) 10/31/2007   TRIG 176 (H) 09/23/2016   CHOLHDL 3.7 09/23/2016   Uncontrolled and not at goal, needs to reduce fat in diet   Prediabetes Deteriorated Patient educated about the importance of limiting  Carbohydrate intake , the need to commit to  daily physical activity for a minimum of 30 minutes , and to commit weight loss. The fact that changes in all these areas will reduce or eliminate all together the development of diabetes is stressed.   Diabetic Labs Latest Ref Rng & Units 04/06/2017 09/23/2016 03/28/2016 09/23/2015 06/15/2015  HbA1c <5.7 % of total Hgb 6.0(H) - 5.7(H) 6.2(H) -  Chol <200 mg/dL - 173 255(H) 185 -  HDL >50 mg/dL - 47(L) 47(L) 63 -  Calc LDL <100 mg/dL - 91 159(H) 100 -  Triglycerides <150  mg/dL - 176(H) 243(H) 109 -  Creatinine 0.60 - 0.93 mg/dL 0.73 0.77 0.90 0.87 0.80   BP/Weight 05/03/2017 02/25/2017 11/29/2016 10/16/2016 09/04/2016 08/04/3792 04/21/7612  Systolic BP 709 295 747 340 370 964 383  Diastolic BP 68 70 78 78 76 70 79  Wt. (Lbs) 161 150 150 151 157 157 159  BMI 27.64 25.75 25.75 25.92 26.95 26.95 27.29   No flowsheet data found.

## 2017-05-05 NOTE — Assessment & Plan Note (Signed)
Hyperlipidemia:Low fat diet discussed and encouraged.   Lipid Panel  Lab Results  Component Value Date   CHOL 173 09/23/2016   HDL 47 (L) 09/23/2016   LDLCALC 91 09/23/2016   LDLDIRECT 141 (H) 10/31/2007   TRIG 176 (H) 09/23/2016   CHOLHDL 3.7 09/23/2016   Uncontrolled and not at goal, needs to reduce fat in diet

## 2017-05-05 NOTE — Assessment & Plan Note (Signed)
Treated by endo, weigt gain and mood abnormality noted

## 2017-05-05 NOTE — Assessment & Plan Note (Signed)
DASH diet and commitment to daily physical activity for a minimum of 30 minutes discussed and encouraged, as a part of hypertension management. The importance of attaining a healthy weight is also discussed.  BP/Weight 05/03/2017 02/25/2017 11/29/2016 10/16/2016 09/04/2016 0/34/7425 10/25/6385  Systolic BP 564 332 951 884 166 063 016  Diastolic BP 68 70 78 78 76 70 79  Wt. (Lbs) 161 150 150 151 157 157 159  BMI 27.64 25.75 25.75 25.92 26.95 26.95 27.29   Noted to have 11 pound weight gain, will need additional medication if elevated BP persists

## 2017-05-05 NOTE — Assessment & Plan Note (Signed)
Continue current medication but needs to be treated by psychiatry, soonest appt  Pt very interested in getting the help she needs ,  Realizes she is not herself and wants to get better

## 2017-05-05 NOTE — Assessment & Plan Note (Signed)
Andrea Santiago educated about the importance of limiting  Carbohydrate intake , the need to commit to daily physical activity for a minimum of 30 minutes , and to commit weight loss. The fact that changes in all these areas will reduce or eliminate all together the development of diabetes is stressed.   Diabetic Labs Latest Ref Rng & Units 04/06/2017 09/23/2016 03/28/2016 09/23/2015 06/15/2015  HbA1c <5.7 % of total Hgb 6.0(H) - 5.7(H) 6.2(H) -  Chol <200 mg/dL - 173 255(H) 185 -  HDL >50 mg/dL - 47(L) 47(L) 63 -  Calc LDL <100 mg/dL - 91 159(H) 100 -  Triglycerides <150 mg/dL - 176(H) 243(H) 109 -  Creatinine 0.60 - 0.93 mg/dL 0.73 0.77 0.90 0.87 0.80   BP/Weight 05/03/2017 02/25/2017 11/29/2016 10/16/2016 09/04/2016 3/72/9021 02/20/5518  Systolic BP 802 233 612 244 975 300 511  Diastolic BP 68 70 78 78 76 70 79  Wt. (Lbs) 161 150 150 151 157 157 159  BMI 27.64 25.75 25.75 25.92 26.95 26.95 27.29   No flowsheet data found.

## 2017-05-07 NOTE — Telephone Encounter (Signed)
Left message of normal results

## 2017-05-10 NOTE — Progress Notes (Signed)
Results mailed 

## 2017-05-31 ENCOUNTER — Ambulatory Visit: Payer: Medicare Other | Admitting: Rheumatology

## 2017-06-04 DIAGNOSIS — M542 Cervicalgia: Secondary | ICD-10-CM | POA: Diagnosis not present

## 2017-06-04 DIAGNOSIS — G43019 Migraine without aura, intractable, without status migrainosus: Secondary | ICD-10-CM | POA: Diagnosis not present

## 2017-06-04 DIAGNOSIS — M791 Myalgia, unspecified site: Secondary | ICD-10-CM | POA: Diagnosis not present

## 2017-06-04 DIAGNOSIS — G518 Other disorders of facial nerve: Secondary | ICD-10-CM | POA: Diagnosis not present

## 2017-06-04 DIAGNOSIS — R51 Headache: Secondary | ICD-10-CM | POA: Diagnosis not present

## 2017-06-13 ENCOUNTER — Telehealth: Payer: Self-pay

## 2017-06-13 NOTE — Telephone Encounter (Signed)
[facility] - left message.  

## 2017-06-20 ENCOUNTER — Other Ambulatory Visit: Payer: Self-pay | Admitting: Family Medicine

## 2017-06-20 ENCOUNTER — Ambulatory Visit: Payer: Medicare Other

## 2017-06-21 ENCOUNTER — Telehealth: Payer: Self-pay | Admitting: Clinical

## 2017-06-21 DIAGNOSIS — F322 Major depressive disorder, single episode, severe without psychotic features: Secondary | ICD-10-CM

## 2017-06-21 NOTE — BH Specialist Note (Signed)
[facility] Initial Clinical Assessment  MRN: [id number] NAME: Andrea Santiago Date: 06/21/17  Start time: 9:47 am  End time: 10:36 am Total time: 49 min  Type of Contact: Type of Contact: Phone Call Initial Contact Patient consent obtained: Patient consent obtained for Virtual Visit: Yes Reason for Visit today: Reason for Your Call/Visit Today: Elevated Depression  Treatment History Patient recently received Inpatient Treatment: Have You Recently Been in Any Inpatient Treatment (Hospital/Detox/Crisis Center/28-Day Program)?: No  Facility/Program:    Date of discharge:   Patient currently being seen by therapist/psychiatrist: Do You Currently Have a Therapist/Psychiatrist?: Yes(Jay Slaydon but hasn't seen him in awhile due to finance & transportation, was seeing him 1x/month) Patient currently receiving the following services: Patient Currently Receiving the Following Services:: Currently not receiving any services  Past Psychiatric History/Hospitalization(s): Anxiety: Yes Bipolar Disorder: Needs to be assessed Depression: Yes Mania: Needs to be assessed Psychosis: Needs to be assessed Schizophrenia: Needs to be assessed Personality Disorder: Needs to be assessed Hospitalization for psychiatric illness: No History of Electroconvulsive Shock Therapy: No Prior Suicide Attempts: Needs to be assessed  Clinical Assessment:  PHQ-9 Assessments: Depression screen [facility] 2/9 06/21/2017 05/05/2017 09/20/2016  Decreased Interest 0 3 2  Down, Depressed, Hopeless 2 3 3   PHQ - 2 Score 2 6 5   Altered sleeping 3 3 2   Tired, decreased energy 3 3 2   Change in appetite 1 3 0  Feeling bad or failure about yourself  3 3 3   Trouble concentrating 0 3 0  Moving slowly or fidgety/restless 0 3 3  Suicidal thoughts 0 3 0  PHQ-9 Score 12 27 15   Difficult doing work/chores Very difficult Very difficult Somewhat difficult  Some recent data might be hidden    GAD-7 Assessments: To be  obtained at a later date No flowsheet data found.   Social Functioning Social maturity: Social Maturity: Responsible Social judgement: Social Judgement: Normal  Stress Current stressors: Current Stressors: Finances, Family conflict, Grief/losses Familial stressors: Familial Stressors: Abandonment(By her family - adult children) 19 yo son in prison and the eldest son stopped talking to her last year.  She reported her husband left her & son many years ago when kids were 44 yo & 60 yo. Death of pt's sister & brother.  Sleep: Sleep: Difficulty falling asleep, Other (Comment)(Fibromyalgia and thyroid problems ) Appetite: Appetite: No problems Coping ability: Coping ability: Overwhelmed(Does talk to PCP & write in a book) Her religion, reading the bible, & faith in God helps her Patient taking medications as prescribed: Patient taking medications as prescribed: No prescribed medications(Stopped Fluoxetine)  Current medications:  Outpatient Encounter Medications as of 06/21/2017  Medication Sig  . Ascorbic Acid (VITAMIN C) 1000 MG tablet Take 1,000 mg by mouth daily.    . budesonide-formoterol (SYMBICORT) 160-4.5 MCG/ACT inhaler Inhale 2 puffs into the lungs 2 (two) times daily as needed.   . butalbital-acetaminophen-caffeine (FIORICET, ESGIC) 50-325-40 MG tablet One tablet once daily for severe headache, maximum of two per week  . Cholecalciferol (VITAMIN D PO) Take 1 tablet by mouth daily.  . Cinnamon 500 MG capsule Take 500 mg by mouth daily.  . Cyanocobalamin (VITAMIN B 12 PO) Take 1 tablet by mouth daily.  Marland Kitchen docusate sodium (COLACE) 100 MG capsule Take 1 capsule (100 mg total) by mouth 2 (two) times daily.  . DULoxetine (CYMBALTA) 60 MG capsule TAKE ONE CAPSULE BY MOUTH TWICE A DAY  . FLUoxetine (PROZAC) 40 MG capsule TAKE ONE CAPSULE BY MOUTH  EVERY DAY  . Ginger, Zingiber officinalis, (GINGER PO) Take 1 tablet by mouth daily.  . hydrochlorothiazide (HYDRODIURIL) 25 MG tablet TAKE 1 TABLET  BY MOUTH EVERY DAY  . KLOR-CON M10 10 MEQ tablet TAKE 3 TABLETS (30 MEQ TOTAL) BY MOUTH DAILY.  Marland Kitchen levothyroxine (SYNTHROID, LEVOTHROID) 75 MCG tablet TAKE 1 TABLET (75 MCG TOTAL) BY MOUTH DAILY BEFORE BREAKFAST.  . meloxicam (MOBIC) 7.5 MG tablet TAKE 1 TABLET (7.5 MG TOTAL) BY MOUTH DAILY.  . niacin (NIASPAN) 1000 MG CR tablet TAKE 2 TABLETS BY MOUTH AT BEDTIME  . ondansetron (ZOFRAN) 4 MG tablet One tablet once daily as needed, for severe nausea associated wiith headache  . polyethylene glycol powder (GLYCOLAX/MIRALAX) powder MIX 17 GRAMS IN GLASS OF WATER AND DRINK ONCE TO TWICE DAILY FOR CONSTIPATION  . PROAIR HFA 108 (90 BASE) MCG/ACT inhaler INHALE 2 PUFFS INTO THE LUNGS EVERY 4 HOURS AS NEEDED FOR WHEEZING  . tiZANidine (ZANAFLEX) 4 MG tablet TAKE 1 TABLET BY MOUTH 3 TIMES A DAY  . topiramate (TOPAMAX) 25 MG tablet Take 1 tablet (25 mg total) by mouth 2 (two) times daily.  . traZODone (DESYREL) 150 MG tablet TAKE 1 TABLET (150 MG TOTAL) BY MOUTH AT BEDTIME.  . Turmeric 500 MG CAPS Take 1 capsule by mouth daily.  . VOLTAREN 1 % GEL Apply 2 g topically 3 (three) times daily as needed (for pain).  Marland Kitchen zonisamide (ZONEGRAN) 25 MG capsule TAKE 4 CAPSULES BY MOUTH DAILY FOR 30 DAYS   No facility-administered encounter medications on file as of 06/21/2017.     Self-harm Behaviors Risk Assessment Self-harm risk factors: Self-harm risk factors: Social withdrawal/isolation, Chronic pain Patient endorses recent thoughts of harming self: Have you recently had any thoughts about harming yourself?: No  [place] Suicide Severity Rating Scale: No flowsheet data found.  Danger to Others Risk Assessment Danger to others risk factors: Danger to Others Risk Factors: No risk factors noted Patient endorses recent thoughts of harming others: Notification required: No need or identified person  Dynamic Appraisal of Situational Aggression (DASA): No flowsheet data found.  Substance Use Assessment Patient  recently consumed alcohol: Have you recently consumed alcohol?: No  Alcohol Use Disorder Identification Test (AUDIT): No flowsheet data found. Patient recently used drugs: Have you recently used any drugs?: No  Opioid Risk Assessment:  Patient is concerned about dependence or abuse of substances: Does patient seem concerned about dependence or abuse of any substance?: No    Goals, Interventions and Follow-up Plan Goals: Increase her ability to forgive herself about about a past choice she made last year Interventions: Functional Assessment of ADLs and Psychoeducation and/or Health Education    Summary of Clinical Assessment Summary:   Ms. Kenyatta Gloeckner is a 77 yo female who lives by herself and referred for elevated depression symptoms.  Ms. Flansburg has Fibromyalgia, Coronary Atherosclerotic Disease, and Depression.  Ms. Kauzlarich has experienced multiple life changes and stressors including limited finances and no current family support.   Ms. Fraiser stopped taking 40 mg Fluoxetine last month since she thought it was not working and she is concerned with taking any type of medications.  Ms. Streng is open to taking anti-depressants again if psychiatrist and PCP recommends it.   Ms. Maris's PHQ-9 Score has decreased from 27 to 12 in the last month, she denied any SI/HI.  Ms. Yearick stopped taking Trazodone last year due to headaches.  She reported ongoing difficulties with sleep.  Ms. Lapka reported difficulties with a choice  she made last year. Her goal is working on forgiving herself, since that choice has caused her distress.   Follow-up Plan:  Refer to Psychiatrist for Medication Management - Consultation Connect with [employer] ( shelter, financial assistance, food sources, community activities) [place] phone call follow up in a 1 week  Ms. Tehani reported she will look up Forgiveness Verses in the next week

## 2017-06-22 ENCOUNTER — Telehealth: Payer: Self-pay | Admitting: Clinical

## 2017-06-22 NOTE — Telephone Encounter (Signed)
This [facility] specialist left message to call back with name and contact information.[facility] Specialist wanted to follow up with a few more questions for the assessment and community resources she may be interested in.

## 2017-06-25 ENCOUNTER — Ambulatory Visit: Payer: Medicare Other

## 2017-06-26 ENCOUNTER — Encounter (HOSPITAL_COMMUNITY): Payer: Self-pay | Admitting: Psychiatry

## 2017-06-27 ENCOUNTER — Encounter: Payer: Self-pay | Admitting: Clinical

## 2017-06-27 NOTE — Telephone Encounter (Signed)
This encounter was created in error - please disregard.

## 2017-06-27 NOTE — BH Specialist Note (Signed)
A user error has taken place: encounter opened in error, closed for administrative reasons.

## 2017-06-27 NOTE — Progress Notes (Signed)
[facility] Psychiatric Consultant Case Review   Summary Andrea Santiago is a 77 y.o. year old female with history of depression, fibromyalgia, hypothyroidism, multinodular goiter, headache, s/p L5 decompressive lumbar laminectomy. She self discontinued fluxetine last month. She has been on duloxetine.  Psychosocial factors: 76 year old son in prison, estranged from her eldest son, her husband left the patient her son after 24 years of marriage, loss of her sister, brother   Current Medications Current Outpatient Medications on File Prior to Visit  Medication Sig Dispense Refill  . Ascorbic Acid (VITAMIN C) 1000 MG tablet Take 1,000 mg by mouth daily.      . budesonide-formoterol (SYMBICORT) 160-4.5 MCG/ACT inhaler Inhale 2 puffs into the lungs 2 (two) times daily as needed.  1 Inhaler 12  . butalbital-acetaminophen-caffeine (FIORICET, ESGIC) 50-325-40 MG tablet One tablet once daily for severe headache, maximum of two per week 20 tablet 0  . Cholecalciferol (VITAMIN D PO) Take 1 tablet by mouth daily.    . Cinnamon 500 MG capsule Take 500 mg by mouth daily.    . Cyanocobalamin (VITAMIN B 12 PO) Take 1 tablet by mouth daily.    Marland Kitchen docusate sodium (COLACE) 100 MG capsule Take 1 capsule (100 mg total) by mouth 2 (two) times daily. 30 capsule 0  . DULoxetine (CYMBALTA) 60 MG capsule TAKE ONE CAPSULE BY MOUTH TWICE A DAY 180 capsule 1  . FLUoxetine (PROZAC) 40 MG capsule TAKE ONE CAPSULE BY MOUTH EVERY DAY 90 capsule 1  . Ginger, Zingiber officinalis, (GINGER PO) Take 1 tablet by mouth daily.    . hydrochlorothiazide (HYDRODIURIL) 25 MG tablet TAKE 1 TABLET BY MOUTH EVERY DAY 90 tablet 1  . KLOR-CON M10 10 MEQ tablet TAKE 3 TABLETS (30 MEQ TOTAL) BY MOUTH DAILY. 270 tablet 1  . levothyroxine (SYNTHROID, LEVOTHROID) 75 MCG tablet TAKE 1 TABLET (75 MCG TOTAL) BY MOUTH DAILY BEFORE BREAKFAST. 30 tablet 3  . meloxicam (MOBIC) 7.5 MG tablet TAKE 1 TABLET (7.5 MG TOTAL) BY  MOUTH DAILY. 90 tablet 1  . niacin (NIASPAN) 1000 MG CR tablet TAKE 2 TABLETS BY MOUTH AT BEDTIME 180 tablet 1  . ondansetron (ZOFRAN) 4 MG tablet One tablet once daily as needed, for severe nausea associated wiith headache 10 tablet 0  . polyethylene glycol powder (GLYCOLAX/MIRALAX) powder MIX 17 GRAMS IN GLASS OF WATER AND DRINK ONCE TO TWICE DAILY FOR CONSTIPATION 527 g 3  . PROAIR HFA 108 (90 BASE) MCG/ACT inhaler INHALE 2 PUFFS INTO THE LUNGS EVERY 4 HOURS AS NEEDED FOR WHEEZING 8.5 Inhaler 1  . tiZANidine (ZANAFLEX) 4 MG tablet TAKE 1 TABLET BY MOUTH 3 TIMES A DAY 270 tablet 1  . topiramate (TOPAMAX) 25 MG tablet Take 1 tablet (25 mg total) by mouth 2 (two) times daily. 60 tablet 2  . traZODone (DESYREL) 150 MG tablet TAKE 1 TABLET (150 MG TOTAL) BY MOUTH AT BEDTIME. 90 tablet 1  . Turmeric 500 MG CAPS Take 1 capsule by mouth daily.    . VOLTAREN 1 % GEL Apply 2 g topically 3 (three) times daily as needed (for pain). 3 Tube 3  . zonisamide (ZONEGRAN) 25 MG capsule TAKE 4 CAPSULES BY MOUTH DAILY FOR 30 DAYS  1   No current facility-administered medications on file prior to visit.      Past psychiatry history Outpatient: [facility] for therapy Psychiatry admission: denies Previous suicide attempt:  Past trials of medication: fluoxetine,  History of violence:  Current measures Depression screen [facility] 2/9 06/21/2017 05/05/2017 09/20/2016 09/04/2016 07/11/2016  Decreased Interest 0 3 2 0 3  Down, Depressed, Hopeless 2 3 3  0 3  PHQ - 2 Score 2 6 5  0 6  Altered sleeping 3 3 2  - 1  Tired, decreased energy 3 3 2  - 2  Change in appetite 1 3 0 - 0  Feeling bad or failure about yourself  3 3 3  - 1  Trouble concentrating 0 3 0 - 1  Moving slowly or fidgety/restless 0 3 3 - 1  Suicidal thoughts 0 3 0 - 0  PHQ-9 Score 12 27 15  - 12  Difficult doing work/chores Very difficult Very difficult Somewhat difficult - -  Some recent data might be hidden   No flowsheet data found.  Goals (patient  centered) Work on self compassion  Assessment/Provisional Diagnosis # MDD Will add mirtazapine as adjunctive treatment for depression given she has been on duloxetine, and also to target insomnia.   Recommendation - Start mirtazapine 7.5 mg at night. Monitor weight gain/oversedation. Consider uptitration after one month as indicated (Maximum targeted dose 30-45 mg per day) - [place] specialist will guide patient for self compassion, behavioral activation  Thank you for your consult. We will continue to follow the patient. Please contact Bureau  for any questions or concerns.   The above treatment considerations and suggestions are based on consultation with the [facility] specialist and/or PCP and a review of information available in the shared registry and the patient's [facility] Record (EHR). I have not personally examined the patient. All recommendations should be implemented with consideration of the patient's relevant prior history and current clinical status. Please feel free to call me with any questions about the care of this patient.

## 2017-06-27 NOTE — Progress Notes (Signed)
[facility] Psychiatric Consultant Case Review   Summary Andrea Santiago is a 77 y.o. year old female with history of depression, fibromyalgia, hypothyroidism, multinodular goiter, headache, s/p L5 decompressive lumbar laminectomy. She self discontinued fluoxetine last month. She has been on duloxetine.  Psychosocial factors: 46 year old son in prison, estranged from her eldest son, her husband left the patient her son after 22 years of marriage, loss of her sister, brother  Current Medications Current Outpatient Medications on File Prior to Visit  Medication Sig Dispense Refill  . Ascorbic Acid (VITAMIN C) 1000 MG tablet Take 1,000 mg by mouth daily.      . budesonide-formoterol (SYMBICORT) 160-4.5 MCG/ACT inhaler Inhale 2 puffs into the lungs 2 (two) times daily as needed.  1 Inhaler 12  . butalbital-acetaminophen-caffeine (FIORICET, ESGIC) 50-325-40 MG tablet One tablet once daily for severe headache, maximum of two per week 20 tablet 0  . Cholecalciferol (VITAMIN D PO) Take 1 tablet by mouth daily.    . Cinnamon 500 MG capsule Take 500 mg by mouth daily.    . Cyanocobalamin (VITAMIN B 12 PO) Take 1 tablet by mouth daily.    Marland Kitchen docusate sodium (COLACE) 100 MG capsule Take 1 capsule (100 mg total) by mouth 2 (two) times daily. 30 capsule 0  . DULoxetine (CYMBALTA) 60 MG capsule TAKE ONE CAPSULE BY MOUTH TWICE A DAY 180 capsule 1  . FLUoxetine (PROZAC) 40 MG capsule TAKE ONE CAPSULE BY MOUTH EVERY DAY 90 capsule 1  . Ginger, Zingiber officinalis, (GINGER PO) Take 1 tablet by mouth daily.    . hydrochlorothiazide (HYDRODIURIL) 25 MG tablet TAKE 1 TABLET BY MOUTH EVERY DAY 90 tablet 1  . KLOR-CON M10 10 MEQ tablet TAKE 3 TABLETS (30 MEQ TOTAL) BY MOUTH DAILY. 270 tablet 1  . levothyroxine (SYNTHROID, LEVOTHROID) 75 MCG tablet TAKE 1 TABLET (75 MCG TOTAL) BY MOUTH DAILY BEFORE BREAKFAST. 30 tablet 3  . meloxicam (MOBIC) 7.5 MG tablet TAKE 1 TABLET (7.5 MG TOTAL) BY  MOUTH DAILY. 90 tablet 1  . niacin (NIASPAN) 1000 MG CR tablet TAKE 2 TABLETS BY MOUTH AT BEDTIME 180 tablet 1  . ondansetron (ZOFRAN) 4 MG tablet One tablet once daily as needed, for severe nausea associated with headache 10 tablet 0  . polyethylene glycol powder (GLYCOLAX/MIRALAX) powder MIX 17 GRAMS IN GLASS OF WATER AND DRINK ONCE TO TWICE DAILY FOR CONSTIPATION 527 g 3  . PROAIR HFA 108 (90 BASE) MCG/ACT inhaler INHALE 2 PUFFS INTO THE LUNGS EVERY 4 HOURS AS NEEDED FOR WHEEZING 8.5 Inhaler 1  . tiZANidine (ZANAFLEX) 4 MG tablet TAKE 1 TABLET BY MOUTH 3 TIMES A DAY 270 tablet 1  . topiramate (TOPAMAX) 25 MG tablet Take 1 tablet (25 mg total) by mouth 2 (two) times daily. 60 tablet 2  . traZODone (DESYREL) 150 MG tablet TAKE 1 TABLET (150 MG TOTAL) BY MOUTH AT BEDTIME. 90 tablet 1  . Turmeric 500 MG CAPS Take 1 capsule by mouth daily.    . VOLTAREN 1 % GEL Apply 2 g topically 3 (three) times daily as needed (for pain). 3 Tube 3  . zonisamide (ZONEGRAN) 25 MG capsule TAKE 4 CAPSULES BY MOUTH DAILY FOR 30 DAYS  1   No current facility-administered medications on file prior to visit.      Past psychiatry history Outpatient: Andrea Santiago for therapy Psychiatry admission: denies Previous suicide attempt:  Past trials of medication: fluoxetine, trazodone (headache) History of violence:  Current measures Depression screen [facility] 2/9 06/21/2017 05/05/2017 09/20/2016 09/04/2016 07/11/2016  Decreased Interest 0 3 2 0 3  Down, Depressed, Hopeless 2 3 3  0 3  PHQ - 2 Score 2 6 5  0 6  Altered sleeping 3 3 2  - 1  Tired, decreased energy 3 3 2  - 2  Change in appetite 1 3 0 - 0  Feeling bad or failure about yourself  3 3 3  - 1  Trouble concentrating 0 3 0 - 1  Moving slowly or fidgety/restless 0 3 3 - 1  Suicidal thoughts 0 3 0 - 0  PHQ-9 Score 12 27 15  - 12  Difficult doing work/chores Very difficult Very difficult Somewhat difficult - -  Some recent data might be hidden   No flowsheet data  found.  Goals (patient centered) Work on self compassion  Assessment/Provisional Diagnosis # MDD Will add mirtazapine as adjunctive treatment for depression given she has been on duloxetine, and also to target insomnia.   Recommendation - Start mirtazapine 7.5 mg at night. Monitor weight gain/oversedation. Consider uptitration after one month as indicated (Maximum targeted dose 30-45 mg per day) - [facility] specialist will guide patient for self compassion, behavioral activation  Thank you for your consult. We will continue to follow the patient. Please contact [facility]  for any questions or concerns.   The above treatment considerations and suggestions are based on consultation with the [employer] specialist and/or PCP and a review of information available in the shared registry and the patient's [facility] Record (EHR). I have not personally examined the patient. All recommendations should be implemented with consideration of the patient's relevant prior history and current clinical status. Please feel free to call me with any questions about the care of this patient.

## 2017-07-05 ENCOUNTER — Telehealth: Payer: Self-pay | Admitting: Clinical

## 2017-07-05 NOTE — Telephone Encounter (Signed)
This [facility] specialist left message to call back with name and contact information. 

## 2017-07-10 DIAGNOSIS — Z961 Presence of intraocular lens: Secondary | ICD-10-CM | POA: Diagnosis not present

## 2017-07-10 DIAGNOSIS — H04123 Dry eye syndrome of bilateral lacrimal glands: Secondary | ICD-10-CM | POA: Diagnosis not present

## 2017-07-11 ENCOUNTER — Other Ambulatory Visit: Payer: Self-pay | Admitting: Family Medicine

## 2017-07-17 ENCOUNTER — Other Ambulatory Visit: Payer: Self-pay | Admitting: Family Medicine

## 2017-07-25 ENCOUNTER — Telehealth: Payer: Self-pay | Admitting: Family Medicine

## 2017-07-25 NOTE — Telephone Encounter (Signed)
No labs needed

## 2017-07-25 NOTE — Telephone Encounter (Signed)
Patient called in to ask if she needed labs for her next office visit 08/06/17  [phone]/ [phone]   Note. Brandi provided # [phone]/ [phone] per patients request to contact [place].

## 2017-07-25 NOTE — Telephone Encounter (Signed)
Patient aware no labs needed

## 2017-07-30 ENCOUNTER — Telehealth: Payer: Self-pay

## 2017-07-30 NOTE — Telephone Encounter (Signed)
[facility] - Left Message  

## 2017-07-31 ENCOUNTER — Other Ambulatory Visit: Payer: Self-pay | Admitting: Family Medicine

## 2017-07-31 DIAGNOSIS — M542 Cervicalgia: Secondary | ICD-10-CM | POA: Diagnosis not present

## 2017-07-31 DIAGNOSIS — R51 Headache: Secondary | ICD-10-CM | POA: Diagnosis not present

## 2017-07-31 DIAGNOSIS — G43019 Migraine without aura, intractable, without status migrainosus: Secondary | ICD-10-CM | POA: Diagnosis not present

## 2017-07-31 DIAGNOSIS — G518 Other disorders of facial nerve: Secondary | ICD-10-CM | POA: Diagnosis not present

## 2017-07-31 DIAGNOSIS — M791 Myalgia, unspecified site: Secondary | ICD-10-CM | POA: Diagnosis not present

## 2017-08-06 ENCOUNTER — Ambulatory Visit (INDEPENDENT_AMBULATORY_CARE_PROVIDER_SITE_OTHER): Payer: Medicare Other | Admitting: Family Medicine

## 2017-08-06 ENCOUNTER — Telehealth: Payer: Self-pay

## 2017-08-06 ENCOUNTER — Encounter: Payer: Self-pay | Admitting: Family Medicine

## 2017-08-06 VITALS — BP 128/82 | HR 78 | Resp 16 | Ht 64.0 in | Wt 161.0 lb

## 2017-08-06 DIAGNOSIS — R51 Headache: Secondary | ICD-10-CM | POA: Diagnosis not present

## 2017-08-06 DIAGNOSIS — E663 Overweight: Secondary | ICD-10-CM

## 2017-08-06 DIAGNOSIS — M797 Fibromyalgia: Secondary | ICD-10-CM | POA: Diagnosis not present

## 2017-08-06 DIAGNOSIS — R519 Headache, unspecified: Secondary | ICD-10-CM

## 2017-08-06 DIAGNOSIS — I10 Essential (primary) hypertension: Secondary | ICD-10-CM

## 2017-08-06 DIAGNOSIS — F322 Major depressive disorder, single episode, severe without psychotic features: Secondary | ICD-10-CM

## 2017-08-06 DIAGNOSIS — Z1211 Encounter for screening for malignant neoplasm of colon: Secondary | ICD-10-CM

## 2017-08-06 MED ORDER — MELOXICAM 7.5 MG PO TABS: 7.5000 mg | ORAL_TABLET | Freq: Every day | ORAL | 0 refills | Status: DC

## 2017-08-06 MED ORDER — NIACIN ER (ANTIHYPERLIPIDEMIC) 1000 MG PO TBCR: 2000.0000 mg | EXTENDED_RELEASE_TABLET | Freq: Every day | ORAL | 1 refills | Status: DC

## 2017-08-06 NOTE — Patient Instructions (Addendum)
Wellness visit with nurse past due please schedule  Md visit in early Novemebr, call if you need me before  Fasting lipid, cmp and EGFr, TSH and Vit D in [place] are being referred for your colonoscopy to the Dr Who did the last one in [place]  Thankful much better, continue to trust and move forward!!!  Thank you  for choosing [facility]. We consider it a privelige to serve you.  Delivering excellent health care in a caring and  compassionate way is our goal.  Partnering with you,  so that together we can achieve this goal is our strategy.

## 2017-08-06 NOTE — Telephone Encounter (Signed)
[facility] - left message.  

## 2017-08-06 NOTE — Progress Notes (Signed)
   Andrea Santiago     MRN: [id number]      DOB: [birth year]   HPI Andrea Santiago is here for follow up and re-evaluation of chronic medical conditions, medication management and review of any available recent lab and radiology data.  Preventive health is updated, specifically  Cancer screening and Immunization.  Colonoscopy is past due and she is being referred Currently having consultations on the phone re depression and is much improved.  Was intolerant of fluoxetine and discontinued the medication, doid not feel good There are no new concerns.  There are no specific complaints   ROS Denies recent fever or chills. Denies sinus pressure, nasal congestion, ear pain or sore throat. Denies chest congestion, productive cough or wheezing. Denies chest pains, palpitations and leg swelling Denies abdominal pain, nausea, vomiting,diarrhea or constipation.   Denies dysuria, frequency, hesitancy or incontinence. Denies uncontrolled  joint pain, swelling and limitation in mobility. Denies uncontrolled headaches, seizures, numbness, or tingling. Denies uncontrolled  depression, anxiety or insomnia. Denies skin break down or rash.   PE  BP 128/82   Pulse 78   Resp 16   Ht 5\' 4"  (1.626 m)   Wt 161 lb (73 kg)   SpO2 98%   BMI 27.64 kg/m   Patient alert and oriented and in no cardiopulmonary distress.  HEENT: No facial asymmetry, EOMI,   oropharynx pink and moist.  Neck supple no JVD, no mass.  Chest: Clear to auscultation bilaterally.  CVS: S1, S2 no murmurs, no S3.Regular rate.  ABD: Soft non tender.   Ext: No edema  MS: decreased  ROM spine, shoulders, hips and knees.  Skin: Intact, no ulcerations or rash noted.  Psych: Good eye contact, normal affect. Memory intact not anxious or depressed appearing.  CNS: CN 2-12 intact, power,  normal throughout.no focal deficits noted.   Assessment & Plan  Elevated systolic blood pressure reading without diagnosis of  hypertension Controlled, no change in medication DASH diet and commitment to daily physical activity for a minimum of 30 minutes discussed and encouraged, as a part of hypertension management. The importance of attaining a healthy weight is also discussed.  BP/Weight 08/06/2017 05/03/2017 02/25/2017 11/29/2016 10/16/2016 09/04/2016 2/84/1324  Systolic BP 401 027 253 664 403 474 259  Diastolic BP 82 68 70 78 78 76 70  Wt. (Lbs) 161 161 150 150 151 157 157  BMI 27.64 27.64 25.75 25.75 25.92 26.95 26.95       Depression, major, single episode, severe (HCC) Marked improvement with therapy, no medication change, was intolerant of fluoxetine, doing well on current medication  Headache disorder Controlled , no medication change  Fibromyalgia Controlled and managed by rheumatology  Overweight Unchanged Patient re-educated about  the importance of commitment to a  minimum of 150 minutes of exercise per week.  The importance of healthy food choices with portion control discussed. Encouraged to start a food diary, count calories and to consider  joining a support group. Sample diet sheets offered. Goals set by the patient for the next several months.   Weight /BMI 08/06/2017 05/03/2017 02/25/2017  WEIGHT 161 lb 161 lb 150 lb  HEIGHT 5\' 4"  5\' 4"  5\' 4"   BMI 27.64 kg/m2 27.64 kg/m2 25.75 kg/m2

## 2017-08-09 ENCOUNTER — Telehealth: Payer: Self-pay | Admitting: Clinical

## 2017-08-09 NOTE — Telephone Encounter (Signed)
This [facility] specialist received a call from Ms. Moore to call back.  TC to Ms. Bisceglia, no answer.  [facility] specialist left name & contact information for [facility] [phone]) for her to call back.

## 2017-08-13 NOTE — Assessment & Plan Note (Signed)
Controlled , no medication change

## 2017-08-13 NOTE — Assessment & Plan Note (Signed)
Unchanged Patient re-educated about  the importance of commitment to a  minimum of 150 minutes of exercise per week.  The importance of healthy food choices with portion control discussed. Encouraged to start a food diary, count calories and to consider  joining a support group. Sample diet sheets offered. Goals set by the patient for the next several months.   Weight /BMI 08/06/2017 05/03/2017 02/25/2017  WEIGHT 161 lb 161 lb 150 lb  HEIGHT 5\' 4"  5\' 4"  5\' 4"   BMI 27.64 kg/m2 27.64 kg/m2 25.75 kg/m2

## 2017-08-13 NOTE — Assessment & Plan Note (Signed)
Controlled and managed by rheumatology

## 2017-08-13 NOTE — Assessment & Plan Note (Signed)
Controlled, no change in medication DASH diet and commitment to daily physical activity for a minimum of 30 minutes discussed and encouraged, as a part of hypertension management. The importance of attaining a healthy weight is also discussed.  BP/Weight 08/06/2017 05/03/2017 02/25/2017 11/29/2016 10/16/2016 09/04/2016 5/97/4163  Systolic BP 845 364 680 321 224 825 003  Diastolic BP 82 68 70 78 78 76 70  Wt. (Lbs) 161 161 150 150 151 157 157  BMI 27.64 27.64 25.75 25.75 25.92 26.95 26.95

## 2017-08-13 NOTE — Assessment & Plan Note (Signed)
Marked improvement with therapy, no medication change, was intolerant of fluoxetine, doing well on current medication

## 2017-08-15 ENCOUNTER — Encounter: Payer: Self-pay | Admitting: Gastroenterology

## 2017-08-26 ENCOUNTER — Other Ambulatory Visit: Payer: Self-pay | Admitting: Family Medicine

## 2017-08-29 ENCOUNTER — Telehealth: Payer: Self-pay

## 2017-08-29 NOTE — Telephone Encounter (Signed)
[facility] - Left Msg 

## 2017-09-04 ENCOUNTER — Other Ambulatory Visit: Payer: Self-pay | Admitting: "Endocrinology

## 2017-09-04 ENCOUNTER — Ambulatory Visit: Payer: Medicare Other | Admitting: "Endocrinology

## 2017-09-04 DIAGNOSIS — E042 Nontoxic multinodular goiter: Secondary | ICD-10-CM

## 2017-09-04 DIAGNOSIS — E039 Hypothyroidism, unspecified: Secondary | ICD-10-CM

## 2017-09-10 DIAGNOSIS — E039 Hypothyroidism, unspecified: Secondary | ICD-10-CM | POA: Diagnosis not present

## 2017-09-10 LAB — T4, FREE: Free T4: 1 ng/dL (ref 0.8–1.8)

## 2017-09-10 LAB — TSH: TSH: 7.05 mIU/L — ABNORMAL HIGH (ref 0.40–4.50)

## 2017-09-11 ENCOUNTER — Other Ambulatory Visit: Payer: Self-pay

## 2017-09-11 ENCOUNTER — Encounter: Payer: Self-pay | Admitting: Family Medicine

## 2017-09-11 ENCOUNTER — Ambulatory Visit (INDEPENDENT_AMBULATORY_CARE_PROVIDER_SITE_OTHER): Payer: Medicare Other | Admitting: Family Medicine

## 2017-09-11 VITALS — BP 132/60 | HR 62 | Ht 64.0 in | Wt 164.0 lb

## 2017-09-11 DIAGNOSIS — M25551 Pain in right hip: Secondary | ICD-10-CM

## 2017-09-11 DIAGNOSIS — I10 Essential (primary) hypertension: Secondary | ICD-10-CM | POA: Diagnosis not present

## 2017-09-11 DIAGNOSIS — R0789 Other chest pain: Secondary | ICD-10-CM | POA: Insufficient documentation

## 2017-09-11 DIAGNOSIS — E559 Vitamin D deficiency, unspecified: Secondary | ICD-10-CM | POA: Diagnosis not present

## 2017-09-11 DIAGNOSIS — G8929 Other chronic pain: Secondary | ICD-10-CM | POA: Insufficient documentation

## 2017-09-11 DIAGNOSIS — M25562 Pain in left knee: Secondary | ICD-10-CM | POA: Diagnosis not present

## 2017-09-11 DIAGNOSIS — E7849 Other hyperlipidemia: Secondary | ICD-10-CM

## 2017-09-11 DIAGNOSIS — M25569 Pain in unspecified knee: Secondary | ICD-10-CM | POA: Insufficient documentation

## 2017-09-11 DIAGNOSIS — M533 Sacrococcygeal disorders, not elsewhere classified: Secondary | ICD-10-CM | POA: Diagnosis not present

## 2017-09-11 MED ORDER — METHYLPREDNISOLONE ACETATE 80 MG/ML IJ SUSP
80.0000 mg | Freq: Once | INTRAMUSCULAR | Status: AC
Start: 2017-09-11 — End: 2017-09-11
  Administered 2017-09-11: 80 mg via INTRAMUSCULAR

## 2017-09-11 MED ORDER — KETOROLAC TROMETHAMINE 30 MG/ML IJ SOLN: 30.0000 mg | Freq: Once | INTRAMUSCULAR | Status: DC

## 2017-09-11 MED ORDER — KETOROLAC TROMETHAMINE 60 MG/2ML IM SOLN
60.0000 mg | Freq: Once | INTRAMUSCULAR | Status: AC
  Administered 2017-09-11: 30 mg via INTRAMUSCULAR

## 2017-09-11 MED ORDER — PREDNISONE 5 MG PO TABS: ORAL_TABLET | ORAL | 0 refills | Status: DC

## 2017-09-11 NOTE — Assessment & Plan Note (Addendum)
Right posterior chest pain s/p fall x 3 weeks with direct trauma to chest, X ray to furthers eval

## 2017-09-11 NOTE — Assessment & Plan Note (Addendum)
4 week h/o right anterior knee pain s/p fall, no visible bruise, X ray to furthers eval

## 2017-09-11 NOTE — Progress Notes (Signed)
Andrea Santiago     MRN: [id number]      DOB: 1940/07/07   HPI [name] is here with c/o right SI joint pain localized rated at a 5 , and also right lower posterior  chest pain x 3 weeks, she fell backward flat on her back on  Out of a truck and this triggered the injury. Now reports hobbling because of SI  Joint pain Pain in rib cage is mainly when she turns  Over in her bed to the left pain up to an 8  No pain with breathing Injured left knee when she fell and hit the knee while dog sitting in another injury 4 week ago, just feels numb ROS Denies recent fever or chills. Denies sinus pressure, nasal congestion, ear pain or sore throat. Denies chest congestion, productive cough or wheezing. Denies chest pains, palpitations and leg swelling Denies abdominal pain, nausea, vomiting,diarrhea or constipation.   Denies dysuria, frequency, hesitancy or incontinence.  Denies headaches or  seizures Denies depression, anxiety or insomnia. Denies skin break down or rash.c/o bruising on left inner thigh  PE  BP 132/60   Pulse 62   Ht 5\' 4"  (1.626 m)   Wt 164 lb (74.4 kg)   SpO2 98%   BMI 28.15 kg/m   Patient alert and oriented and in no cardiopulmonary distress.  HEENT: No facial asymmetry, EOMI,   oropharynx pink and moist.  Neck supple no JVD, no mass.  Chest: Clear to auscultation bilaterally.  CVS: S1, S2 no murmurs, no S3.Regular rate.  ABD: Soft non tender.   Ext: No edema  MS: Tender  Right SI joint ,  No bruising , of knee noted   Skin: Intact, no ulcerations or rash noted.  Psych: Good eye contact, normal affect. Memory intact not anxious or depressed appearing.  CNS: CN 2-12 intact, power,  normal throughout.no focal deficits noted.   Assessment & Plan  Chronic right SI joint pain 3 week h/o right SI joint pain s/p fall Uncontrolled.Toradol and depo medrol administered IM in the office , to be followed by a short course of oral prednisone and NSAIDS.  and 5  day course of prednisone prescribed. X ray ordered  Hip pain, chronic, right Right hip pain x 3 week with limping, following injury, X ray to furthers eval  Posterior chest pain Right posterior chest pain s/p fall x 3 weeks with direct trauma to chest, X ray to furthers eval  Anterior knee pain 4 week h/o right anterior knee pain s/p fall, no visible bruise, X ray to furthers eval  Essential hypertension Controlled, no change in medication DASH diet and commitment to daily physical activity for a minimum of 30 minutes discussed and encouraged, as a part of hypertension management. The importance of attaining a healthy weight is also discussed.  BP/Weight 09/18/2017 09/11/2017 08/06/2017 05/03/2017 02/25/2017 11/29/2016 04/05/863  Systolic BP 784 696 295 284 132 440 102  Diastolic BP 60 60 82 68 70 78 78  Wt. (Lbs) 160 164 161 161 150 150 151  BMI 27.46 28.15 27.64 27.64 25.75 25.75 25.92       Hyperlipemia Hyperlipidemia:Low fat diet discussed and encouraged.   Lipid Panel  Lab Results  Component Value Date   CHOL 173 09/23/2016   HDL 47 (L) 09/23/2016   LDLCALC 91 09/23/2016   LDLDIRECT 141 (H) 10/31/2007   TRIG 176 (H) 09/23/2016   CHOLHDL 3.7 09/23/2016   Updated lab needed

## 2017-09-11 NOTE — Assessment & Plan Note (Addendum)
3 week h/o right SI joint pain s/p fall Uncontrolled.Toradol and depo medrol administered IM in the office , to be followed by a short course of oral prednisone and NSAIDS.  and 5 day course of prednisone prescribed. X ray ordered

## 2017-09-11 NOTE — Assessment & Plan Note (Addendum)
Right hip pain x 3 week with limping, following injury, X ray to furthers eval

## 2017-09-11 NOTE — Patient Instructions (Signed)
F/U as before, call if you need me before   You will receive Toradol 60 mg IM and depo Medrol,80 mg IM in the office today for pain in hip and knee and chest, also 5 days of prednisone are sent in  Please get X rays of your chest , hip and left knee today , we will call  You with results

## 2017-09-12 ENCOUNTER — Ambulatory Visit (HOSPITAL_COMMUNITY)
Admission: RE | Admit: 2017-09-12 | Discharge: 2017-09-12 | Disposition: A | Discharge status: Home / Self Care | Payer: Medicare Other | Source: Ambulatory Visit | Attending: Family Medicine | Admitting: Family Medicine

## 2017-09-12 DIAGNOSIS — M533 Sacrococcygeal disorders, not elsewhere classified: Secondary | ICD-10-CM | POA: Insufficient documentation

## 2017-09-12 DIAGNOSIS — M25551 Pain in right hip: Secondary | ICD-10-CM | POA: Diagnosis not present

## 2017-09-12 DIAGNOSIS — R079 Chest pain, unspecified: Secondary | ICD-10-CM | POA: Diagnosis not present

## 2017-09-12 DIAGNOSIS — G8929 Other chronic pain: Secondary | ICD-10-CM | POA: Diagnosis not present

## 2017-09-12 DIAGNOSIS — R0789 Other chest pain: Secondary | ICD-10-CM | POA: Insufficient documentation

## 2017-09-12 DIAGNOSIS — S8992XA Unspecified injury of left lower leg, initial encounter: Secondary | ICD-10-CM | POA: Diagnosis not present

## 2017-09-12 DIAGNOSIS — M25562 Pain in left knee: Secondary | ICD-10-CM | POA: Diagnosis not present

## 2017-09-12 DIAGNOSIS — S79911A Unspecified injury of right hip, initial encounter: Secondary | ICD-10-CM | POA: Diagnosis not present

## 2017-09-17 ENCOUNTER — Ambulatory Visit: Payer: Medicare Other

## 2017-09-17 ENCOUNTER — Telehealth: Payer: Self-pay

## 2017-09-17 ENCOUNTER — Telehealth: Payer: Self-pay | Admitting: Family Medicine

## 2017-09-17 DIAGNOSIS — Z780 Asymptomatic menopausal state: Secondary | ICD-10-CM

## 2017-09-17 DIAGNOSIS — Z1231 Encounter for screening mammogram for malignant neoplasm of breast: Secondary | ICD-10-CM

## 2017-09-17 NOTE — Telephone Encounter (Signed)
Patient left voicemail requesting a call back to go over her xrays Cb# [phone]

## 2017-09-17 NOTE — Telephone Encounter (Signed)
Orders entered

## 2017-09-17 NOTE — Telephone Encounter (Signed)
Has wellness tomorrow. Will give results then

## 2017-09-18 ENCOUNTER — Ambulatory Visit (INDEPENDENT_AMBULATORY_CARE_PROVIDER_SITE_OTHER): Payer: Medicare Other

## 2017-09-18 VITALS — BP 120/60 | HR 68 | Resp 16 | Ht 64.0 in | Wt 160.0 lb

## 2017-09-18 DIAGNOSIS — Z0000 Encounter for general adult medical examination without abnormal findings: Secondary | ICD-10-CM | POA: Diagnosis not present

## 2017-09-18 NOTE — Telephone Encounter (Signed)
Pt aware.

## 2017-09-18 NOTE — Patient Instructions (Addendum)
Andrea Santiago , Thank you for taking time to come for your Medicare Wellness Visit. I appreciate your ongoing commitment to your health goals. Please review the following plan we discussed and let me know if I can assist you in the future.   Schedule mammo and bone density at checkout  Ask insurance if shingrix is covered (new shingles vaccine)    Screening recommendations/referrals: Colonoscopy: Already scheduled  Mammogram: need to schedule Bone Density: Need to schedule Recommended yearly ophthalmology/optometry visit for glaucoma screening and checkup Recommended yearly dental visit for hygiene and checkup  Vaccinations: Influenza vaccine: Due in Sept  Pneumococcal vaccine: Up to date Tdap vaccine: up to date Shingles vaccine: ask insurance if Shingrix is covered     Advanced directives: info given   Conditions/risks identified: done   Next appointment: 01/03/2018   Preventive Care 51 Years and Older, Female Preventive care refers to lifestyle choices and visits with your health care provider that can promote health and wellness. What does preventive care include?  A yearly physical exam. This is also called an annual well check.  Dental exams once or twice a year.  Routine eye exams. Ask your health care provider how often you should have your eyes checked.  Personal lifestyle choices, including:  Daily care of your teeth and gums.  Regular physical activity.  Eating a healthy diet.  Avoiding tobacco and drug use.  Limiting alcohol use.  Practicing safe sex.  Taking low-dose aspirin every day.  Taking vitamin and mineral supplements as recommended by your health care provider. What happens during an annual well check? The services and screenings done by your health care provider during your annual well check will depend on your age, overall health, lifestyle risk factors, and family history of disease. Counseling  Your health care provider may ask you  questions about your:  Alcohol use.  Tobacco use.  Drug use.  Emotional well-being.  Home and relationship well-being.  Sexual activity.  Eating habits.  History of falls.  Memory and ability to understand (cognition).  Work and work Statistician.  Reproductive health. Screening  You may have the following tests or measurements:  Height, weight, and BMI.  Blood pressure.  Lipid and cholesterol levels. These may be checked every 5 years, or more frequently if you are over 25 years old.  Skin check.  Lung cancer screening. You may have this screening every year starting at age 37 if you have a 30-pack-year history of smoking and currently smoke or have quit within the past 15 years.  Fecal occult blood test (FOBT) of the stool. You may have this test every year starting at age 53.  Flexible sigmoidoscopy or colonoscopy. You may have a sigmoidoscopy every 5 years or a colonoscopy every 10 years starting at age 86.  Hepatitis C blood test.  Hepatitis B blood test.  Sexually transmitted disease (STD) testing.  Diabetes screening. This is done by checking your blood sugar (glucose) after you have not eaten for a while (fasting). You may have this done every 1-3 years.  Bone density scan. This is done to screen for osteoporosis. You may have this done starting at age 34.  Mammogram. This may be done every 1-2 years. Talk to your health care provider about how often you should have regular mammograms. Talk with your health care provider about your test results, treatment options, and if necessary, the need for more tests. Vaccines  Your health care provider may recommend certain vaccines, such as:  Influenza vaccine. This is recommended every year.  Tetanus, diphtheria, and acellular pertussis (Tdap, Td) vaccine. You may need a Td booster every 10 years.  Zoster vaccine. You may need this after age 19.  Pneumococcal 13-valent conjugate (PCV13) vaccine. One dose is  recommended after age 32.  Pneumococcal polysaccharide (PPSV23) vaccine. One dose is recommended after age 51. Talk to your health care provider about which screenings and vaccines you need and how often you need them. This information is not intended to replace advice given to you by your health care provider. Make sure you discuss any questions you have with your health care provider. Document Released: 03/05/2015 Document Revised: 10/27/2015 Document Reviewed: 12/08/2014 Elsevier Interactive Patient Education  2017 [place] Prevention in the Home Falls can cause injuries. They can happen to people of all ages. There are many things you can do to make your home safe and to help prevent falls. What can I do on the outside of my home?  Regularly fix the edges of walkways and driveways and fix any cracks.  Remove anything that might make you trip as you walk through a door, such as a raised step or threshold.  Trim any bushes or trees on the path to your home.  Use bright outdoor lighting.  Clear any walking paths of anything that might make someone trip, such as rocks or tools.  Regularly check to see if handrails are loose or broken. Make sure that both sides of any steps have handrails.  Any raised decks and porches should have guardrails on the edges.  Have any leaves, snow, or ice cleared regularly.  Use sand or salt on walking paths during winter.  Clean up any spills in your garage right away. This includes oil or grease spills. What can I do in the bathroom?  Use night lights.  Install grab bars by the toilet and in the tub and shower. Do not use towel bars as grab bars.  Use non-skid mats or decals in the tub or shower.  If you need to sit down in the shower, use a plastic, non-slip stool.  Keep the floor dry. Clean up any water that spills on the floor as soon as it happens.  Remove soap buildup in the tub or shower regularly.  Attach bath mats  securely with double-sided non-slip rug tape.  Do not have throw rugs and other things on the floor that can make you trip. What can I do in the bedroom?  Use night lights.  Make sure that you have a light by your bed that is easy to reach.  Do not use any sheets or blankets that are too big for your bed. They should not hang down onto the floor.  Have a firm chair that has side arms. You can use this for support while you get dressed.  Do not have throw rugs and other things on the floor that can make you trip. What can I do in the kitchen?  Clean up any spills right away.  Avoid walking on wet floors.  Keep items that you use a lot in easy-to-reach places.  If you need to reach something above you, use a strong step stool that has a grab bar.  Keep electrical cords out of the way.  Do not use floor polish or wax that makes floors slippery. If you must use wax, use non-skid floor wax.  Do not have throw rugs and other things on the floor that  can make you trip. What can I do with my stairs?  Do not leave any items on the stairs.  Make sure that there are handrails on both sides of the stairs and use them. Fix handrails that are broken or loose. Make sure that handrails are as long as the stairways.  Check any carpeting to make sure that it is firmly attached to the stairs. Fix any carpet that is loose or worn.  Avoid having throw rugs at the top or bottom of the stairs. If you do have throw rugs, attach them to the floor with carpet tape.  Make sure that you have a light switch at the top of the stairs and the bottom of the stairs. If you do not have them, ask someone to add them for you. What else can I do to help prevent falls?  Wear shoes that:  Do not have high heels.  Have rubber bottoms.  Are comfortable and fit you well.  Are closed at the toe. Do not wear sandals.  If you use a stepladder:  Make sure that it is fully opened. Do not climb a closed  stepladder.  Make sure that both sides of the stepladder are locked into place.  Ask someone to hold it for you, if possible.  Clearly mark and make sure that you can see:  Any grab bars or handrails.  First and last steps.  Where the edge of each step is.  Use tools that help you move around (mobility aids) if they are needed. These include:  Canes.  Walkers.  Scooters.  Crutches.  Turn on the lights when you go into a dark area. Replace any light bulbs as soon as they burn out.  Set up your furniture so you have a clear path. Avoid moving your furniture around.  If any of your floors are uneven, fix them.  If there are any pets around you, be aware of where they are.  Review your medicines with your doctor. Some medicines can make you feel dizzy. This can increase your chance of falling. Ask your doctor what other things that you can do to help prevent falls. This information is not intended to replace advice given to you by your health care provider. Make sure you discuss any questions you have with your health care provider. Document Released: 12/03/2008 Document Revised: 07/15/2015 Document Reviewed: 03/13/2014 Elsevier Interactive Patient Education  2017 [employer].
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
## 2017-09-24 DIAGNOSIS — D2339 Other benign neoplasm of skin of other parts of face: Secondary | ICD-10-CM | POA: Diagnosis not present

## 2017-09-24 DIAGNOSIS — B081 Molluscum contagiosum: Secondary | ICD-10-CM | POA: Diagnosis not present

## 2017-09-24 DIAGNOSIS — D485 Neoplasm of uncertain behavior of skin: Secondary | ICD-10-CM | POA: Diagnosis not present

## 2017-09-26 ENCOUNTER — Telehealth: Payer: Self-pay

## 2017-09-26 NOTE — Telephone Encounter (Signed)
Unable to leave a message - phone just rang.

## 2017-09-27 ENCOUNTER — Ambulatory Visit: Payer: Medicare Other | Admitting: "Endocrinology

## 2017-09-28 ENCOUNTER — Ambulatory Visit (HOSPITAL_COMMUNITY)
Admission: RE | Admit: 2017-09-28 | Discharge: 2017-09-28 | Disposition: A | Discharge status: Home / Self Care | Payer: Medicare Other | Source: Ambulatory Visit | Attending: "Endocrinology | Admitting: "Endocrinology

## 2017-09-28 ENCOUNTER — Ambulatory Visit (HOSPITAL_COMMUNITY)
Admission: RE | Admit: 2017-09-28 | Discharge: 2017-09-28 | Disposition: A | Discharge status: Home / Self Care | Payer: Medicare Other | Source: Ambulatory Visit | Attending: Family Medicine | Admitting: Family Medicine

## 2017-09-28 ENCOUNTER — Encounter (HOSPITAL_COMMUNITY): Payer: Self-pay

## 2017-09-28 DIAGNOSIS — E042 Nontoxic multinodular goiter: Secondary | ICD-10-CM | POA: Diagnosis not present

## 2017-09-28 DIAGNOSIS — Z1231 Encounter for screening mammogram for malignant neoplasm of breast: Secondary | ICD-10-CM | POA: Insufficient documentation

## 2017-09-28 DIAGNOSIS — Z780 Asymptomatic menopausal state: Secondary | ICD-10-CM | POA: Insufficient documentation

## 2017-09-28 DIAGNOSIS — M85851 Other specified disorders of bone density and structure, right thigh: Secondary | ICD-10-CM | POA: Diagnosis not present

## 2017-09-29 ENCOUNTER — Other Ambulatory Visit: Payer: Self-pay | Admitting: Family Medicine

## 2017-09-30 ENCOUNTER — Encounter: Payer: Self-pay | Admitting: Family Medicine

## 2017-09-30 NOTE — Assessment & Plan Note (Signed)
Controlled, no change in medication DASH diet and commitment to daily physical activity for a minimum of 30 minutes discussed and encouraged, as a part of hypertension management. The importance of attaining a healthy weight is also discussed.  BP/Weight 09/18/2017 09/11/2017 08/06/2017 05/03/2017 02/25/2017 11/29/2016 3/47/4259  Systolic BP 563 875 643 329 518 841 660  Diastolic BP 60 60 82 68 70 78 78  Wt. (Lbs) 160 164 161 161 150 150 151  BMI 27.46 28.15 27.64 27.64 25.75 25.75 25.92

## 2017-09-30 NOTE — Assessment & Plan Note (Signed)
Hyperlipidemia:Low fat diet discussed and encouraged.   Lipid Panel  Lab Results  Component Value Date   CHOL 173 09/23/2016   HDL 47 (L) 09/23/2016   LDLCALC 91 09/23/2016   LDLDIRECT 141 (H) 10/31/2007   TRIG 176 (H) 09/23/2016   CHOLHDL 3.7 09/23/2016   Updated lab needed

## 2017-10-03 DIAGNOSIS — M542 Cervicalgia: Secondary | ICD-10-CM | POA: Diagnosis not present

## 2017-10-03 DIAGNOSIS — M791 Myalgia, unspecified site: Secondary | ICD-10-CM | POA: Diagnosis not present

## 2017-10-03 DIAGNOSIS — R51 Headache: Secondary | ICD-10-CM | POA: Diagnosis not present

## 2017-10-03 DIAGNOSIS — G518 Other disorders of facial nerve: Secondary | ICD-10-CM | POA: Diagnosis not present

## 2017-10-03 DIAGNOSIS — G43019 Migraine without aura, intractable, without status migrainosus: Secondary | ICD-10-CM | POA: Diagnosis not present

## 2017-10-09 ENCOUNTER — Telehealth: Payer: Self-pay

## 2017-10-09 NOTE — Telephone Encounter (Signed)
3rd attempt - [facility] unable to leave a message, phone just rang.

## 2017-10-11 ENCOUNTER — Encounter: Payer: Self-pay | Admitting: "Endocrinology

## 2017-10-11 ENCOUNTER — Ambulatory Visit (INDEPENDENT_AMBULATORY_CARE_PROVIDER_SITE_OTHER): Payer: Medicare Other | Admitting: "Endocrinology

## 2017-10-11 VITALS — BP 132/78 | HR 68 | Ht 64.0 in | Wt 164.0 lb

## 2017-10-11 DIAGNOSIS — E042 Nontoxic multinodular goiter: Secondary | ICD-10-CM

## 2017-10-11 DIAGNOSIS — E039 Hypothyroidism, unspecified: Secondary | ICD-10-CM | POA: Diagnosis not present

## 2017-10-11 MED ORDER — LEVOTHYROXINE SODIUM 88 MCG PO TABS: 88.0000 ug | ORAL_TABLET | Freq: Every day | ORAL | 6 refills | Status: DC
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
## 2017-10-16 ENCOUNTER — Other Ambulatory Visit: Payer: Self-pay | Admitting: Family Medicine

## 2017-10-19 ENCOUNTER — Telehealth: Payer: Self-pay

## 2017-10-19 NOTE — Telephone Encounter (Signed)
Several attempts have been made to contact patient without success. Patient will be placed on the inactive list.  If services are needed again.  Please contact [facility] at [phone].    Information will be routed to the PCP and Dr. Hisada  

## 2017-11-01 ENCOUNTER — Other Ambulatory Visit: Payer: Self-pay

## 2017-11-01 MED ORDER — DULOXETINE HCL 60 MG PO CPEP: 60.0000 mg | ORAL_CAPSULE | Freq: Two times a day (BID) | ORAL | 1 refills | Status: DC

## 2017-11-12 ENCOUNTER — Encounter: Payer: Self-pay | Admitting: Nurse Practitioner

## 2017-11-12 ENCOUNTER — Encounter: Payer: Self-pay | Admitting: *Deleted

## 2017-11-12 ENCOUNTER — Other Ambulatory Visit: Payer: Self-pay | Admitting: *Deleted

## 2017-11-12 ENCOUNTER — Telehealth: Payer: Self-pay | Admitting: *Deleted

## 2017-11-12 ENCOUNTER — Ambulatory Visit (INDEPENDENT_AMBULATORY_CARE_PROVIDER_SITE_OTHER): Payer: Medicare Other | Admitting: Nurse Practitioner

## 2017-11-12 DIAGNOSIS — K5900 Constipation, unspecified: Secondary | ICD-10-CM | POA: Diagnosis not present

## 2017-11-12 DIAGNOSIS — R109 Unspecified abdominal pain: Secondary | ICD-10-CM | POA: Insufficient documentation

## 2017-11-12 DIAGNOSIS — R1084 Generalized abdominal pain: Secondary | ICD-10-CM | POA: Diagnosis not present

## 2017-11-12 DIAGNOSIS — Z1211 Encounter for screening for malignant neoplasm of colon: Secondary | ICD-10-CM

## 2017-11-12 MED ORDER — NA SULFATE-K SULFATE-MG SULF 17.5-3.13-1.6 GM/177ML PO SOLN: 1.0000 | ORAL | 0 refills | Status: DC

## 2017-11-12 NOTE — Telephone Encounter (Signed)
Pre-op scheduled for 01/02/18 at 11:00am. Letter mailed. LMOVM for pt.

## 2017-11-12 NOTE — Assessment & Plan Note (Signed)
Chronic history of constipation was previously well managed on MiraLAX.  Insurance stopped paying for MiraLAX as a prescription.  She is requesting a prescription option as over-the-counter MiraLAX is too expensive.  She is previously tried Linzess which she states did not work.  I will try her on Amitiza 24 mcg twice daily on a full stomach.  We will provide samples for 1 to 2 weeks and request a progress report 1 to 2 weeks.  Additionally she is due for a colonoscopy based on results of her last flexible sigmoidoscopy.  Recommended colonoscopy on propofol/MAC at the time of her last procedure.  Follow-up in 3 months.  Proceed with colonoscopy on propofol/MAC with Dr. Oneida Alar in the near future. The risks, benefits, and alternatives have been discussed in detail with the patient. They state understanding and desire to proceed.   The patient is currently on Cymbalta.  No other anticoagulants, anxiolytics, chronic pain medications, or antidepressants.  Last endoscopic evaluation with EGD/flexible sigmoidoscopy completed on conscious sedation and recommended next procedure on propofol/MAC.

## 2017-11-12 NOTE — Patient Instructions (Signed)
1. I am giving you samples of Amitiza 24 mcg.  Take this twice a day, on a full stomach. 2. Call us in 1 to 2 weeks and let us know if it is helping her constipation. 3. We will schedule your colonoscopy for you. 4. Return for follow-up in 3 months. 5. Call us if you have any questions or concerns.  At [facility] we value your feedback. You may receive a survey about your visit today. Please share your experience as we strive to create trusting relationships with our patients to provide genuine, compassionate, quality care.  We appreciate your understanding and patience as we review any laboratory studies, imaging, and other diagnostic tests that are ordered as we care for you. Our office policy is 5 business days for review of these results, and any emergent or urgent results are addressed in a timely manner for your best interest. If you do not hear from our office in 1 week, please contact us.   We also encourage the use of MyChart, which contains your medical information for your review as well. If you are not enrolled in this feature, an access code is on this after visit summary for your convenience. Thank you for allowing [place] to be involved in your care.  It was great to see you today!  I hope you have a great fall!!

## 2017-11-12 NOTE — Progress Notes (Signed)
CC'D TO PCP °

## 2017-11-12 NOTE — Assessment & Plan Note (Signed)
The patient notes mild generalized tenderness in her abdomen only if constipated.  When she is not constipated she does not have abdominal pain.  This is likely due to her constipation.  We will try to better manage her constipation now that MiraLAX is no longer covered by insurance.  Further management as per above.  Follow-up in 3 months.
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
## 2017-11-23 ENCOUNTER — Telehealth: Payer: Self-pay | Admitting: Gastroenterology

## 2017-11-23 DIAGNOSIS — R1084 Generalized abdominal pain: Secondary | ICD-10-CM

## 2017-11-23 DIAGNOSIS — K5900 Constipation, unspecified: Secondary | ICD-10-CM

## 2017-11-23 NOTE — Telephone Encounter (Signed)
Pt said she was given samples of a laxative to try about 2 weeks ago and was letting us know that worked really well for her.

## 2017-11-26 MED ORDER — LUBIPROSTONE 24 MCG PO CAPS: 24.0000 ug | ORAL_CAPSULE | Freq: Two times a day (BID) | ORAL | 3 refills | Status: DC

## 2017-11-26 NOTE — Telephone Encounter (Signed)
Pt was given samples of Amitiza 24 mcg and it worked well for her. Pt would like medication sent to her pharmacy.

## 2017-11-26 NOTE — Telephone Encounter (Signed)
Pt notfied that RX was sent to her pharmacy.

## 2017-11-26 NOTE — Telephone Encounter (Signed)
Rx sent to pharmacy per patient request. 

## 2017-11-28 ENCOUNTER — Ambulatory Visit (INDEPENDENT_AMBULATORY_CARE_PROVIDER_SITE_OTHER): Payer: Medicare Other | Admitting: Rheumatology

## 2017-11-28 ENCOUNTER — Encounter: Payer: Self-pay | Admitting: Rheumatology

## 2017-11-28 VITALS — BP 179/76 | HR 77 | Resp 14 | Ht 64.0 in | Wt 162.4 lb

## 2017-11-28 DIAGNOSIS — M19042 Primary osteoarthritis, left hand: Secondary | ICD-10-CM

## 2017-11-28 DIAGNOSIS — M170 Bilateral primary osteoarthritis of knee: Secondary | ICD-10-CM

## 2017-11-28 DIAGNOSIS — M797 Fibromyalgia: Secondary | ICD-10-CM | POA: Diagnosis not present

## 2017-11-28 DIAGNOSIS — R5383 Other fatigue: Secondary | ICD-10-CM

## 2017-11-28 DIAGNOSIS — Z8679 Personal history of other diseases of the circulatory system: Secondary | ICD-10-CM | POA: Diagnosis not present

## 2017-11-28 DIAGNOSIS — Z8639 Personal history of other endocrine, nutritional and metabolic disease: Secondary | ICD-10-CM

## 2017-11-28 DIAGNOSIS — Z8659 Personal history of other mental and behavioral disorders: Secondary | ICD-10-CM

## 2017-11-28 DIAGNOSIS — M19041 Primary osteoarthritis, right hand: Secondary | ICD-10-CM | POA: Diagnosis not present

## 2017-11-28 DIAGNOSIS — M19071 Primary osteoarthritis, right ankle and foot: Secondary | ICD-10-CM

## 2017-11-28 DIAGNOSIS — M19072 Primary osteoarthritis, left ankle and foot: Secondary | ICD-10-CM

## 2017-11-28 DIAGNOSIS — M5136 Other intervertebral disc degeneration, lumbar region: Secondary | ICD-10-CM

## 2017-11-28 DIAGNOSIS — M8589 Other specified disorders of bone density and structure, multiple sites: Secondary | ICD-10-CM

## 2017-11-28 DIAGNOSIS — E559 Vitamin D deficiency, unspecified: Secondary | ICD-10-CM | POA: Diagnosis not present

## 2017-11-28 DIAGNOSIS — M51369 Other intervertebral disc degeneration, lumbar region without mention of lumbar back pain or lower extremity pain: Secondary | ICD-10-CM

## 2017-11-28 MED ORDER — VOLTAREN 1 % TD GEL: 2.0000 g | Freq: Three times a day (TID) | TRANSDERMAL | 3 refills | Status: DC | PRN

## 2017-12-04 ENCOUNTER — Other Ambulatory Visit: Payer: Self-pay | Admitting: Family Medicine

## 2017-12-12 ENCOUNTER — Other Ambulatory Visit: Payer: Self-pay | Admitting: Family Medicine

## 2017-12-27 NOTE — Patient Instructions (Signed)
Andrea Santiago  12/27/2017     @PREFPERIOPPHARMACY @   Your procedure is scheduled on  01/08/2018   Report to [facility] at  915   A.M.  Call this number if you have problems the morning of surgery:  [phone]   Remember:  Follow the diet and prep instructions given to you by Dr Nona Dell office.                     Take these medicines the morning of surgery with A SIP OF WATER  Cymbalta, levothyroxine, topamax( if needed). Use your inhalers before you come.    Do not wear jewelry, make-up or nail polish.  Do not wear lotions, powders, or perfumes, or deodorant.  Do not shave 48 hours prior to surgery.  Men may shave face and neck.  Do not bring valuables to the hospital.  [facility] is not responsible for any belongings or valuables.  Contacts, dentures or bridgework may not be worn into surgery.  Leave your suitcase in the car.  After surgery it may be brought to your room.  For patients admitted to the hospital, discharge time will be determined by your treatment team.  Patients discharged the day of surgery will not be allowed to drive home.   Name and phone number of your driver:   family Special instructions:  None  Please read over the following fact sheets that you were given. Anesthesia Post-op Instructions and Care and Recovery After Surgery       Colonoscopy, Adult A colonoscopy is an exam to look at the large intestine. It is done to check for problems, such as:  Lumps (tumors).  Growths (polyps).  Swelling (inflammation).  Bleeding.  What happens before the procedure? Eating and drinking Follow instructions from your doctor about eating and drinking. These instructions may include:  A few days before the procedure - follow a low-fiber diet. ? Avoid nuts. ? Avoid seeds. ? Avoid dried fruit. ? Avoid raw fruits. ? Avoid vegetables.  1-3 days before the procedure - follow a clear liquid diet. Avoid liquids that have red or  purple dye. Drink only clear liquids, such as: ? Clear broth or bouillon. ? Black coffee or tea. ? Clear juice. ? Clear soft drinks or sports drinks. ? Gelatin dessert. ? Popsicles.  On the day of the procedure - do not eat or drink anything during the 2 hours before the procedure.  Bowel prep If you were prescribed an oral bowel prep:  Take it as told by your doctor. Starting the day before your procedure, you will need to drink a lot of liquid. The liquid will cause you to poop (have bowel movements) until your poop is almost clear or light green.  If your skin or butt gets irritated from diarrhea, you may: ? Wipe the area with wipes that have medicine in them, such as adult wet wipes with aloe and vitamin E. ? Put something on your skin that soothes the area, such as petroleum jelly.  If you throw up (vomit) while drinking the bowel prep, take a break for up to 60 minutes. Then begin the bowel prep again. If you keep throwing up and you cannot take the bowel prep without throwing up, call your doctor.  General instructions  Ask your doctor about changing or stopping your normal medicines. This is important if you take diabetes medicines or blood thinners.  Plan  to have someone take you home from the hospital or clinic. What happens during the procedure?  An IV tube may be put into one of your veins.  You will be given medicine to help you relax (sedative).  To reduce your risk of infection: ? Your doctors will wash their hands. ? Your anal area will be washed with soap.  You will be asked to lie on your side with your knees bent.  Your doctor will get a long, thin, flexible tube ready. The tube will have a camera and a light on the end.  The tube will be put into your anus.  The tube will be gently put into your large intestine.  Air will be delivered into your large intestine to keep it open. You may feel some pressure or cramping.  The camera will be used to take  photos.  A small tissue sample may be removed from your body to be looked at under a microscope (biopsy). If any possible problems are found, the tissue will be sent to a lab for testing.  If small growths are found, your doctor may remove them and have them checked for cancer.  The tube that was put into your anus will be slowly removed. The procedure may vary among doctors and hospitals. What happens after the procedure?  Your doctor will check on you often until the medicines you were given have worn off.  Do not drive for 24 hours after the procedure.  You may have a small amount of blood in your poop.  You may pass gas.  You may have mild cramps or bloating in your belly (abdomen).  It is up to you to get the results of your procedure. Ask your doctor, or the department performing the procedure, when your results will be ready. This information is not intended to replace advice given to you by your health care provider. Make sure you discuss any questions you have with your health care provider. Document Released: 03/11/2010 Document Revised: 12/08/2015 Document Reviewed: 04/20/2015 Elsevier Interactive Patient Education  2017 [employer] Inc[employer]  Colonoscopy, Adult, Care After This sheet gives you information about how to care for yourself after your procedure. Your health care provider may also give you more specific instructions. If you have problems or questions, contact your health care provider. What can I expect after the procedure? After the procedure, it is common to have:  A small amount of blood in your stool for 24 hours after the procedure.  Some gas.  Mild abdominal cramping or bloating.  Follow these instructions at home: General instructions   For the first 24 hours after the procedure: ? Do not drive or use machinery. ? Do not sign important documents. ? Do not drink alcohol. ? Do your regular daily activities at a slower pace than normal. ? Eat soft,  easy-to-digest foods. ? Rest often.  Take over-the-counter or prescription medicines only as told by your health care provider.  It is up to you to get the results of your procedure. Ask your health care provider, or the department performing the procedure, when your results will be ready. Relieving cramping and bloating  Try walking around when you have cramps or feel bloated.  Apply heat to your abdomen as told by your health care provider. Use a heat source that your health care provider recommends, such as a moist heat pack or a heating pad. ? Place a towel between your skin and the heat source. ? Leave the  heat on for 20-30 minutes. ? Remove the heat if your skin turns bright red. This is especially important if you are unable to feel pain, heat, or cold. You may have a greater risk of getting burned. Eating and drinking  Drink enough fluid to keep your urine clear or pale yellow.  Resume your normal diet as instructed by your health care provider. Avoid heavy or fried foods that are hard to digest.  Avoid drinking alcohol for as long as instructed by your health care provider. Contact a health care provider if:  You have blood in your stool 2-3 days after the procedure. Get help right away if:  You have more than a small spotting of blood in your stool.  You pass large blood clots in your stool.  Your abdomen is swollen.  You have nausea or vomiting.  You have a fever.  You have increasing abdominal pain that is not relieved with medicine. This information is not intended to replace advice given to you by your health care provider. Make sure you discuss any questions you have with your health care provider. Document Released: 09/21/2003 Document Revised: 11/01/2015 Document Reviewed: 04/20/2015 Elsevier Interactive Patient Education  2018 [place] Anesthesia is a term that refers to techniques, procedures, and medicines that help a  person stay safe and comfortable during a medical procedure. Monitored anesthesia care, or sedation, is one type of anesthesia. Your anesthesia specialist may recommend sedation if you will be having a procedure that does not require you to be unconscious, such as:  Cataract surgery.  A dental procedure.  A biopsy.  A colonoscopy.  During the procedure, you may receive a medicine to help you relax (sedative). There are three levels of sedation:  Mild sedation. At this level, you may feel awake and relaxed. You will be able to follow directions.  Moderate sedation. At this level, you will be sleepy. You may not remember the procedure.  Deep sedation. At this level, you will be asleep. You will not remember the procedure.  The more medicine you are given, the deeper your level of sedation will be. Depending on how you respond to the procedure, the anesthesia specialist may change your level of sedation or the type of anesthesia to fit your needs. An anesthesia specialist will monitor you closely during the procedure. Let your health care provider know about:  Any allergies you have.  All medicines you are taking, including vitamins, herbs, eye drops, creams, and over-the-counter medicines.  Any use of steroids (by mouth or as a cream).  Any problems you or family members have had with sedatives and anesthetic medicines.  Any blood disorders you have.  Any surgeries you have had.  Any medical conditions you have, such as sleep apnea.  Whether you are pregnant or may be pregnant.  Any use of cigarettes, alcohol, or street drugs. What are the risks? Generally, this is a safe procedure. However, problems may occur, including:  Getting too much medicine (oversedation).  Nausea.  Allergic reaction to medicines.  Trouble breathing. If this happens, a breathing tube may be used to help with breathing. It will be removed when you are awake and breathing on your own.  Heart  trouble.  Lung trouble.  Before the procedure Staying hydrated Follow instructions from your health care provider about hydration, which may include:  Up to 2 hours before the procedure - you may continue to drink clear liquids, such as water, clear fruit juice,  black coffee, and plain tea.  Eating and drinking restrictions Follow instructions from your health care provider about eating and drinking, which may include:  8 hours before the procedure - stop eating heavy meals or foods such as meat, fried foods, or fatty foods.  6 hours before the procedure - stop eating light meals or foods, such as toast or cereal.  6 hours before the procedure - stop drinking milk or drinks that contain milk.  2 hours before the procedure - stop drinking clear liquids.  Medicines Ask your health care provider about:  Changing or stopping your regular medicines. This is especially important if you are taking diabetes medicines or blood thinners.  Taking medicines such as aspirin and ibuprofen. These medicines can thin your blood. Do not take these medicines before your procedure if your health care provider instructs you not to.  Tests and exams  You will have a physical exam.  You may have blood tests done to show: ? How well your kidneys and liver are working. ? How well your blood can clot.  General instructions  Plan to have someone take you home from the hospital or clinic.  If you will be going home right after the procedure, plan to have someone with you for 24 hours.  What happens during the procedure?  Your blood pressure, heart rate, breathing, level of pain and overall condition will be monitored.  An IV tube will be inserted into one of your veins.  Your anesthesia specialist will give you medicines as needed to keep you comfortable during the procedure. This may mean changing the level of sedation.  The procedure will be performed. After the procedure  Your blood  pressure, heart rate, breathing rate, and blood oxygen level will be monitored until the medicines you were given have worn off.  Do not drive for 24 hours if you received a sedative.  You may: ? Feel sleepy, clumsy, or nauseous. ? Feel forgetful about what happened after the procedure. ? Have a sore throat if you had a breathing tube during the procedure. ? Vomit. This information is not intended to replace advice given to you by your health care provider. Make sure you discuss any questions you have with your health care provider. Document Released: 11/02/2004 Document Revised: 07/16/2015 Document Reviewed: 05/30/2015 Elsevier Interactive Patient Education  2018 [place], Care After These instructions provide you with information about caring for yourself after your procedure. Your health care provider may also give you more specific instructions. Your treatment has been planned according to current medical practices, but problems sometimes occur. Call your health care provider if you have any problems or questions after your procedure. What can I expect after the procedure? After your procedure, it is common to:  Feel sleepy for several hours.  Feel clumsy and have poor balance for several hours.  Feel forgetful about what happened after the procedure.  Have poor judgment for several hours.  Feel nauseous or vomit.  Have a sore throat if you had a breathing tube during the procedure.  Follow these instructions at home: For at least 24 hours after the procedure:   Do not: ? Participate in activities in which you could fall or become injured. ? Drive. ? Use heavy machinery. ? Drink alcohol. ? Take sleeping pills or medicines that cause drowsiness. ? Make important decisions or sign legal documents. ? Take care of children on your own.  Rest. Eating and drinking  Follow the  diet that is recommended by your health care provider.  If you  vomit, drink water, juice, or soup when you can drink without vomiting.  Make sure you have little or no nausea before eating solid foods. General instructions  Have a responsible adult stay with you until you are awake and alert.  Take over-the-counter and prescription medicines only as told by your health care provider.  If you smoke, do not smoke without supervision.  Keep all follow-up visits as told by your health care provider. This is important. Contact a health care provider if:  You keep feeling nauseous or you keep vomiting.  You feel light-headed.  You develop a rash.  You have a fever. Get help right away if:  You have trouble breathing. This information is not intended to replace advice given to you by your health care provider. Make sure you discuss any questions you have with your health care provider. Document Released: 05/30/2015 Document Revised: 09/29/2015 Document Reviewed: 05/30/2015 Elsevier Interactive Patient Education  [employer].

## 2017-12-31 DIAGNOSIS — I10 Essential (primary) hypertension: Secondary | ICD-10-CM | POA: Diagnosis not present

## 2017-12-31 DIAGNOSIS — M791 Myalgia, unspecified site: Secondary | ICD-10-CM | POA: Diagnosis not present

## 2017-12-31 DIAGNOSIS — M542 Cervicalgia: Secondary | ICD-10-CM | POA: Diagnosis not present

## 2017-12-31 DIAGNOSIS — R51 Headache: Secondary | ICD-10-CM | POA: Diagnosis not present

## 2017-12-31 DIAGNOSIS — G43019 Migraine without aura, intractable, without status migrainosus: Secondary | ICD-10-CM | POA: Diagnosis not present

## 2017-12-31 DIAGNOSIS — E7849 Other hyperlipidemia: Secondary | ICD-10-CM | POA: Diagnosis not present

## 2017-12-31 DIAGNOSIS — G518 Other disorders of facial nerve: Secondary | ICD-10-CM | POA: Diagnosis not present

## 2017-12-31 DIAGNOSIS — E559 Vitamin D deficiency, unspecified: Secondary | ICD-10-CM | POA: Diagnosis not present

## 2018-01-01 LAB — COMPLETE METABOLIC PANEL WITH GFR
AG Ratio: 1.6 (calc) (ref 1.0–2.5)
ALT: 15 U/L (ref 6–29)
AST: 19 U/L (ref 10–35)
Albumin: 4.4 g/dL (ref 3.6–5.1)
Alkaline phosphatase (APISO): 66 U/L (ref 33–130)
BUN: 24 mg/dL (ref 7–25)
CO2: 26 mmol/L (ref 20–32)
Calcium: 9.6 mg/dL (ref 8.6–10.4)
Chloride: 103 mmol/L (ref 98–110)
Creat: 0.66 mg/dL (ref 0.60–0.93)
GFR, Est African American: 97 mL/min/{1.73_m2} (ref >=60)
GFR, Est Non African American: 84 mL/min/{1.73_m2} (ref >=60)
Globulin: 2.7 g/dL (calc) (ref 1.9–3.7)
Glucose, Bld: 106 mg/dL — ABNORMAL HIGH (ref 65–99)
Potassium: 3.6 mmol/L (ref 3.5–5.3)
Sodium: 139 mmol/L (ref 135–146)
Total Bilirubin: 0.4 mg/dL (ref 0.2–1.2)
Total Protein: 7.1 g/dL (ref 6.1–8.1)

## 2018-01-01 LAB — VITAMIN D 25 HYDROXY (VIT D DEFICIENCY, FRACTURES): Vit D, 25-Hydroxy: 25 ng/mL — ABNORMAL LOW (ref 30–100)

## 2018-01-01 LAB — LIPID PANEL
Cholesterol: 210 mg/dL — ABNORMAL HIGH (ref <200)
HDL: 45 mg/dL — ABNORMAL LOW (ref >50)
LDL Cholesterol (Calc): 132 mg/dL (calc) — ABNORMAL HIGH
Non-HDL Cholesterol (Calc): 165 mg/dL (calc) — ABNORMAL HIGH (ref <130)
Total CHOL/HDL Ratio: 4.7 (calc) (ref <5.0)
Triglycerides: 191 mg/dL — ABNORMAL HIGH (ref <150)

## 2018-01-01 LAB — TSH: TSH: 1.09 mIU/L (ref 0.40–4.50)

## 2018-01-02 ENCOUNTER — Encounter (HOSPITAL_COMMUNITY): Payer: Self-pay

## 2018-01-02 ENCOUNTER — Other Ambulatory Visit: Payer: Self-pay

## 2018-01-02 ENCOUNTER — Encounter (HOSPITAL_COMMUNITY)
Admission: RE | Admit: 2018-01-02 | Discharge: 2018-01-02 | Disposition: A | Discharge status: Home / Self Care | Payer: Medicare Other | Source: Ambulatory Visit | Attending: Gastroenterology | Admitting: Gastroenterology

## 2018-01-02 DIAGNOSIS — Z01810 Encounter for preprocedural cardiovascular examination: Secondary | ICD-10-CM | POA: Diagnosis not present

## 2018-01-02 DIAGNOSIS — R9431 Abnormal electrocardiogram [ECG] [EKG]: Secondary | ICD-10-CM | POA: Insufficient documentation

## 2018-01-02 DIAGNOSIS — I517 Cardiomegaly: Secondary | ICD-10-CM | POA: Insufficient documentation

## 2018-01-02 HISTORY — DX: Hypothyroidism, unspecified: E03.9

## 2018-01-02 HISTORY — DX: Unspecified hearing loss, unspecified ear: H91.90

## 2018-01-03 ENCOUNTER — Ambulatory Visit: Payer: Medicare Other | Admitting: Family Medicine

## 2018-01-07 ENCOUNTER — Other Ambulatory Visit: Payer: Self-pay | Admitting: Family Medicine

## 2018-01-07 NOTE — Progress Notes (Signed)
cc'd to pcp 

## 2018-01-07 NOTE — Progress Notes (Signed)
Called, many rings and no answer.

## 2018-01-08 ENCOUNTER — Encounter (HOSPITAL_COMMUNITY): Payer: Self-pay | Admitting: Anesthesiology

## 2018-01-08 ENCOUNTER — Ambulatory Visit (HOSPITAL_COMMUNITY): Payer: Medicare Other | Admitting: Anesthesiology

## 2018-01-08 ENCOUNTER — Encounter (HOSPITAL_COMMUNITY)
Admission: RE | Disposition: A | Discharge status: Home / Self Care | Payer: Self-pay | Source: Ambulatory Visit | Attending: Gastroenterology

## 2018-01-08 ENCOUNTER — Ambulatory Visit (HOSPITAL_COMMUNITY)
Admission: RE | Admit: 2018-01-08 | Discharge: 2018-01-08 | Disposition: A | Discharge status: Home / Self Care | Payer: Medicare Other | Source: Ambulatory Visit | Attending: Gastroenterology | Admitting: Gastroenterology

## 2018-01-08 DIAGNOSIS — K648 Other hemorrhoids: Secondary | ICD-10-CM | POA: Diagnosis not present

## 2018-01-08 DIAGNOSIS — F329 Major depressive disorder, single episode, unspecified: Secondary | ICD-10-CM | POA: Diagnosis not present

## 2018-01-08 DIAGNOSIS — H8109 Meniere's disease, unspecified ear: Secondary | ICD-10-CM | POA: Diagnosis not present

## 2018-01-08 DIAGNOSIS — K5730 Diverticulosis of large intestine without perforation or abscess without bleeding: Secondary | ICD-10-CM | POA: Insufficient documentation

## 2018-01-08 DIAGNOSIS — I10 Essential (primary) hypertension: Secondary | ICD-10-CM | POA: Diagnosis not present

## 2018-01-08 DIAGNOSIS — M1990 Unspecified osteoarthritis, unspecified site: Secondary | ICD-10-CM | POA: Insufficient documentation

## 2018-01-08 DIAGNOSIS — Z87891 Personal history of nicotine dependence: Secondary | ICD-10-CM | POA: Insufficient documentation

## 2018-01-08 DIAGNOSIS — D120 Benign neoplasm of cecum: Secondary | ICD-10-CM | POA: Insufficient documentation

## 2018-01-08 DIAGNOSIS — E039 Hypothyroidism, unspecified: Secondary | ICD-10-CM | POA: Diagnosis not present

## 2018-01-08 DIAGNOSIS — Z7951 Long term (current) use of inhaled steroids: Secondary | ICD-10-CM | POA: Insufficient documentation

## 2018-01-08 DIAGNOSIS — K219 Gastro-esophageal reflux disease without esophagitis: Secondary | ICD-10-CM | POA: Diagnosis not present

## 2018-01-08 DIAGNOSIS — Z1211 Encounter for screening for malignant neoplasm of colon: Secondary | ICD-10-CM | POA: Diagnosis not present

## 2018-01-08 DIAGNOSIS — M797 Fibromyalgia: Secondary | ICD-10-CM | POA: Diagnosis not present

## 2018-01-08 DIAGNOSIS — J449 Chronic obstructive pulmonary disease, unspecified: Secondary | ICD-10-CM | POA: Insufficient documentation

## 2018-01-08 DIAGNOSIS — Q438 Other specified congenital malformations of intestine: Secondary | ICD-10-CM | POA: Diagnosis not present

## 2018-01-08 DIAGNOSIS — E785 Hyperlipidemia, unspecified: Secondary | ICD-10-CM | POA: Diagnosis not present

## 2018-01-08 DIAGNOSIS — M810 Age-related osteoporosis without current pathological fracture: Secondary | ICD-10-CM | POA: Diagnosis not present

## 2018-01-08 HISTORY — PX: POLYPECTOMY: SHX5525

## 2018-01-08 HISTORY — PX: COLONOSCOPY WITH PROPOFOL: SHX5780

## 2018-01-08 SURGERY — COLONOSCOPY WITH PROPOFOL
Anesthesia: General

## 2018-01-08 MED ORDER — ONDANSETRON HCL 4 MG/2ML IJ SOLN
INTRAMUSCULAR | Status: DC | PRN
  Administered 2018-01-08: 4 mg via INTRAVENOUS

## 2018-01-08 MED ORDER — PROPOFOL 10 MG/ML IV BOLUS
INTRAVENOUS | Status: AC
  Filled 2018-01-08: qty 20

## 2018-01-08 MED ORDER — LACTATED RINGERS IV SOLN
INTRAVENOUS | Status: DC
  Administered 2018-01-08 (×2): via INTRAVENOUS

## 2018-01-08 MED ORDER — PROMETHAZINE HCL 25 MG/ML IJ SOLN: 6.2500 mg | INTRAMUSCULAR | Status: DC | PRN

## 2018-01-08 MED ORDER — PROPOFOL 10 MG/ML IV BOLUS
INTRAVENOUS | Status: DC | PRN
  Administered 2018-01-08 (×2): 10 mg via INTRAVENOUS
  Administered 2018-01-08 (×2): 20 mg via INTRAVENOUS
  Administered 2018-01-08: 10 mg via INTRAVENOUS
  Administered 2018-01-08: 20 mg via INTRAVENOUS
  Administered 2018-01-08: 30 mg via INTRAVENOUS
  Administered 2018-01-08 (×7): 20 mg via INTRAVENOUS
  Administered 2018-01-08: 30 mg via INTRAVENOUS
  Administered 2018-01-08 (×3): 20 mg via INTRAVENOUS

## 2018-01-08 MED ORDER — CHLORHEXIDINE GLUCONATE CLOTH 2 % EX PADS: 6.0000 | MEDICATED_PAD | Freq: Once | CUTANEOUS | Status: DC

## 2018-01-08 MED ORDER — MIDAZOLAM HCL 2 MG/2ML IJ SOLN
INTRAMUSCULAR | Status: AC
  Filled 2018-01-08: qty 2

## 2018-01-08 MED ORDER — SPOT INK MARKER SYRINGE KIT
PACK | SUBMUCOSAL | Status: DC | PRN
  Administered 2018-01-08: 1 mL via SUBMUCOSAL

## 2018-01-08 MED ORDER — ONDANSETRON HCL 4 MG/2ML IJ SOLN
INTRAMUSCULAR | Status: AC
  Filled 2018-01-08: qty 4

## 2018-01-08 MED ORDER — HYDROMORPHONE HCL 1 MG/ML IJ SOLN
0.2500 mg | INTRAMUSCULAR | Status: DC | PRN
  Administered 2018-01-08 (×2): 0.5 mg via INTRAVENOUS
  Filled 2018-01-08 (×2): qty 0.5

## 2018-01-08 MED ORDER — SPOT INK MARKER SYRINGE KIT
PACK | SUBMUCOSAL | Status: AC
  Filled 2018-01-08: qty 5

## 2018-01-08 MED ORDER — MIDAZOLAM HCL 2 MG/2ML IJ SOLN: 0.5000 mg | Freq: Once | INTRAMUSCULAR | Status: DC | PRN

## 2018-01-08 MED ORDER — HYDROCODONE-ACETAMINOPHEN 7.5-325 MG PO TABS: 1.0000 | ORAL_TABLET | Freq: Once | ORAL | Status: DC | PRN

## 2018-01-08 MED ORDER — PROPOFOL 500 MG/50ML IV EMUL
INTRAVENOUS | Status: DC | PRN
  Administered 2018-01-08: 150 ug/kg/min via INTRAVENOUS

## 2018-01-08 NOTE — Progress Notes (Signed)
Pt is having a procedure today.
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
## 2018-01-08 NOTE — Transfer of Care (Signed)
Immediate Anesthesia Transfer of Care Note  Patient: Andrea Santiago  Procedure(s) Performed: COLONOSCOPY WITH PROPOFOL (N/A ) POLYPECTOMY  Patient Location: PACU  Anesthesia Type:MAC  Level of Consciousness: awake  Airway & Oxygen Therapy: Patient Spontanous Breathing  Post-op Assessment: Report given to RN  Post vital signs: Reviewed  Last Vitals:  Vitals Value Taken Time  BP    Temp    Pulse 88 01/08/2018  8:39 AM  Resp 21 01/08/2018  8:39 AM  SpO2 86 % 01/08/2018  8:39 AM  Vitals shown include unvalidated device data.  Last Pain:  Vitals:   01/08/18 0735  TempSrc:   PainSc: 0-No pain      Patients Stated Pain Goal: 5 (73/53/29 [id number])  Complications: No apparent anesthesia complications

## 2018-01-08 NOTE — Anesthesia Preprocedure Evaluation (Signed)
Anesthesia Evaluation  Patient identified by MRN, date of birth, ID band Patient awake    Reviewed: Allergy & Precautions, NPO status , Patient's Chart, lab work & pertinent test results  History of Anesthesia Complications (+) PONV  Airway Mallampati: II  TM Distance: >3 FB Neck ROM: Full    Dental no notable dental hx.    Pulmonary COPD, former smoker,    Pulmonary exam normal breath sounds clear to auscultation       Cardiovascular Exercise Tolerance: Good hypertension, Pt. on medications negative cardio ROS Normal cardiovascular examI Rhythm:Regular Rate:Normal  Denied heart issue    Neuro/Psych  Headaches, Depression  Neuromuscular disease negative psych ROS   GI/Hepatic Neg liver ROS, GERD  Medicated and Controlled,  Endo/Other  Hypothyroidism   Renal/GU negative Renal ROS  negative genitourinary   Musculoskeletal  (+) Arthritis , Fibromyalgia -  Abdominal   Peds negative pediatric ROS (+)  Hematology negative hematology ROS (+)   Anesthesia Other Findings   Reproductive/Obstetrics negative OB ROS                             Anesthesia Physical Anesthesia Plan  ASA: II  Anesthesia Plan: General   Post-op Pain Management:    Induction: Intravenous  PONV Risk Score and Plan:   Airway Management Planned: Nasal Cannula and Simple Face Mask  Additional Equipment:   Intra-op Plan:   Post-operative Plan:   Informed Consent: I have reviewed the patients History and Physical, chart, labs and discussed the procedure including the risks, benefits and alternatives for the proposed anesthesia with the patient or authorized representative who has indicated his/her understanding and acceptance.   Dental advisory given  Plan Discussed with: CRNA  Anesthesia Plan Comments:         Anesthesia Quick Evaluation

## 2018-01-08 NOTE — Discharge Instructions (Signed)
You have small internal hemorrhoids and diverticulosis IN YOUR LEFT AND RIGHT COLON. YOU HAD ONE SMALL POLYP REMOVED. YOUR PREP WAS NOT GREAT BUT WE COMPLETED YOUR COLONOSCOPY. IT IS A LOW LIKELIHOOD THAT POLYPS < 5 MM WERE MISSED.   DRINK WATER TO KEEP YOUR URINE LIGHT YELLOW.  FOLLOW A HIGH FIBER DIET. AVOID ITEMS THAT CAUSE BLOATING. See info below.  YOUR BIOPSY RESULTS WILL BE BACK IN 5 BUSINESS DAYS.  USE PREPARATION H FOUR TIMES  A DAY IF NEEDED TO RELIEVE RECTAL PAIN/PRESSURE/BLEEDING.  IF YOU HAVE TROUBLE WITH RECTAL BLEEDING OR LOW BLOOD COUNT YOU SHOULD HAVE ANOTHER COLONOSCOPY. YOU HAVE HAD ADEQUATE, AGE APPROPRIATE, COLON CANCER SCREENING.   Colonoscopy Care After Read the instructions outlined below and refer to this sheet in the next week. These discharge instructions provide you with general information on caring for yourself after you leave the hospital. While your treatment has been planned according to the most current medical practices available, unavoidable complications occasionally occur. If you have any problems or questions after discharge, call DR. Jhene Westmoreland, [phone].  ACTIVITY  You may resume your regular activity, but move at a slower pace for the next 24 hours.   Take frequent rest periods for the next 24 hours.   Walking will help get rid of the air and reduce the bloated feeling in your belly (abdomen).   No driving for 24 hours (because of the medicine (anesthesia) used during the test).   You may shower.   Do not sign any important legal documents or operate any machinery for 24 hours (because of the anesthesia used during the test).    NUTRITION  Drink plenty of fluids.   You may resume your normal diet as instructed by your doctor.   Begin with a light meal and progress to your normal diet. Heavy or fried foods are harder to digest and may make you feel sick to your stomach (nauseated).   Avoid alcoholic beverages for 24 hours or as  instructed.    MEDICATIONS  You may resume your normal medications.   WHAT YOU CAN EXPECT TODAY  Some feelings of bloating in the abdomen.   Passage of more gas than usual.   Spotting of blood in your stool or on the toilet paper  .  IF YOU HAD POLYPS REMOVED DURING THE COLONOSCOPY:  Eat a soft diet IF YOU HAVE NAUSEA, BLOATING, ABDOMINAL PAIN, OR VOMITING.    FINDING OUT THE RESULTS OF YOUR TEST Not all test results are available during your visit. DR. Oneida Alar WILL CALL YOU WITHIN 14 DAYS OF YOUR PROCEDUE WITH YOUR RESULTS. Do not assume everything is normal if you have not heard from DR. Willamina Grieshop, CALL HER OFFICE AT [phone].  SEEK IMMEDIATE MEDICAL ATTENTION AND CALL THE OFFICE: [phone] IF:  You have more than a spotting of blood in your stool.   Your belly is swollen (abdominal distention).   You are nauseated or vomiting.   You have a temperature over 101F.   You have abdominal pain or discomfort that is severe or gets worse throughout the day.  High-Fiber Diet A high-fiber diet changes your normal diet to include more whole grains, legumes, fruits, and vegetables. Changes in the diet involve replacing refined carbohydrates with unrefined foods. The calorie level of the diet is essentially unchanged. The Dietary Reference Intake (recommended amount) for adult males is 38 grams per day. For adult females, it is 25 grams per day. Pregnant and lactating women should consume 28  grams of fiber per day. Fiber is the intact part of a plant that is not broken down during digestion. Functional fiber is fiber that has been isolated from the plant to provide a beneficial effect in the body. PURPOSE  Increase stool bulk.   Ease and regulate bowel movements.   Lower cholesterol.   REDUCE RISK OF COLON CANCER  INDICATIONS THAT YOU NEED MORE FIBER  Constipation and hemorrhoids.   Uncomplicated diverticulosis (intestine condition) and irritable bowel syndrome.    Weight management.   As a protective measure against hardening of the arteries (atherosclerosis), diabetes, and cancer.   GUIDELINES FOR INCREASING FIBER IN THE DIET  Start adding fiber to the diet slowly. A gradual increase of about 5 more grams (2 slices of whole-wheat bread, 2 servings of most fruits or vegetables, or 1 bowl of high-fiber cereal) per day is best. Too rapid an increase in fiber may result in constipation, flatulence, and bloating.   Drink enough water and fluids to keep your urine clear or pale yellow. Water, juice, or caffeine-free drinks are recommended. Not drinking enough fluid may cause constipation.   Eat a variety of high-fiber foods rather than one type of fiber.   Try to increase your intake of fiber through using high-fiber foods rather than fiber pills or supplements that contain small amounts of fiber.   The goal is to change the types of food eaten. Do not supplement your present diet with high-fiber foods, but replace foods in your present diet.   INCLUDE A VARIETY OF FIBER SOURCES  Replace refined and processed grains with whole grains, canned fruits with fresh fruits, and incorporate other fiber sources. White rice, white breads, and most bakery goods contain little or no fiber.   Brown whole-grain rice, buckwheat oats, and many fruits and vegetables are all good sources of fiber. These include: broccoli, Brussels sprouts, cabbage, cauliflower, beets, sweet potatoes, white potatoes (skin on), carrots, tomatoes, eggplant, squash, berries, fresh fruits, and dried fruits.   Cereals appear to be the richest source of fiber. Cereal fiber is found in whole grains and bran. Bran is the fiber-rich outer coat of cereal grain, which is largely removed in refining. In whole-grain cereals, the bran remains. In breakfast cereals, the largest amount of fiber is found in those with "bran" in their names. The fiber content is sometimes indicated on the label.   You may  need to include additional fruits and vegetables each day.   In baking, for 1 cup white flour, you may use the following substitutions:   1 cup whole-wheat flour minus 2 tablespoons.   1/2 cup white flour plus 1/2 cup whole-wheat flour.   Polyps, Colon  A polyp is extra tissue that grows inside your body. Colon polyps grow in the large intestine. The large intestine, also called the colon, is part of your digestive system. It is a long, hollow tube at the end of your digestive tract where your body makes and stores stool. Most polyps are not dangerous. They are benign. This means they are not cancerous. But over time, some types of polyps can turn into cancer. Polyps that are smaller than a pea are usually not harmful. But larger polyps could someday become or may already be cancerous. To be safe, doctors remove all polyps and test them.   PREVENTION There is not one sure way to prevent polyps. You might be able to lower your risk of getting them if you:  Eat more fruits and vegetables  and less fatty food.   Do not smoke.   Avoid alcohol.   Exercise every day.   Lose weight if you are overweight.   Eating more calcium and folate can also lower your risk of getting polyps. Some foods that are rich in calcium are milk, cheese, and broccoli. Some foods that are rich in folate are chickpeas, kidney beans, and spinach.    Diverticulosis Diverticulosis is a common condition that develops when small pouches (diverticula) form in the wall of the colon. The risk of diverticulosis increases with age. It happens more often in people who eat a low-fiber diet. Most individuals with diverticulosis have no symptoms. Those individuals with symptoms usually experience belly (abdominal) pain, constipation, or loose stools (diarrhea).  HOME CARE INSTRUCTIONS  Increase the amount of fiber in your diet as directed by your caregiver or dietician. This may reduce symptoms of diverticulosis.   Drink at  least 6 to 8 glasses of water each day to prevent constipation.   Try not to strain when you have a bowel movement.   Avoiding nuts and seeds to prevent complications is NOT NECESSARY.   FOODS HAVING HIGH FIBER CONTENT INCLUDE:  Fruits. Apple, peach, pear, tangerine, raisins, prunes.   Vegetables. Brussels sprouts, asparagus, broccoli, cabbage, carrot, cauliflower, romaine lettuce, spinach, summer squash, tomato, winter squash, zucchini.   Starchy Vegetables. Baked beans, kidney beans, lima beans, split peas, lentils, potatoes (with skin).   Grains. Whole wheat bread, brown rice, bran flake cereal, plain oatmeal, white rice, shredded wheat, bran muffins.   SEEK IMMEDIATE MEDICAL CARE IF:  You develop increasing pain or severe bloating.   You have an oral temperature above 101F.   You develop vomiting or bowel movements that are bloody or black.

## 2018-01-08 NOTE — Op Note (Addendum)
[facility] Patient Name: Andrea Santiago Procedure Date: 01/08/2018 7:15 AM MRN: [id number] Date of Birth: November 22, 1940 Attending MD: Barney Drain MD, MD CSN: [id number] Age: 77 Admit Type: Outpatient Procedure:                Colonoscopy WITH COLD SNARE POLYPECTOMY/SPOT TATTOO Indications:              Screening for colorectal malignant neoplasm. LAST                            TCS INCOMPLETE DUE TO POOR PREP. Providers:                Barney Drain MD, MD, Jeanann Lewandowsky. Sharon Seller, RN,                            Randa Spike, Technician Referring MD:             Norwood Levo. Simpson MD, MD Medicines:                Propofol per Anesthesia Complications:            No immediate complications. Estimated Blood Loss:     Estimated blood loss was minimal. Procedure:                Pre-Anesthesia Assessment:                           - Prior to the procedure, a History and Physical                            was performed, and patient medications and                            allergies were reviewed. The patient's tolerance of                            previous anesthesia was also reviewed. The risks                            and benefits of the procedure and the sedation                            options and risks were discussed with the patient.                            All questions were answered, and informed consent                            was obtained. Prior Anticoagulants: The patient has                            taken no previous anticoagulant or antiplatelet                            agents. ASA Grade Assessment: II - A patient with  mild systemic disease. After reviewing the risks                            and benefits, the patient was deemed in                            satisfactory condition to undergo the procedure.                            After obtaining informed consent, the colonoscope                            was passed under  direct vision. Throughout the                            procedure, the patient's blood pressure, pulse, and                            oxygen saturations were monitored continuously. The                            CF-[id number] ([id number]) scope was introduced through                            the anus and advanced to the the cecum, identified                            by appendiceal orifice and ileocecal valve. The                            colonoscopy was technically difficult and complex                            due to poor endoscopic visualization and a tortuous                            colon. Successful completion of the procedure was                            aided by straightening and shortening the scope to                            obtain bowel loop reduction, lavage and COLOWRAP.                            The patient tolerated the procedure well. The                            quality of the bowel preparation was GOOD WITH                            LAVAGE. DUE TO RETAINED PARTICULATE MATER THE SCOPE  BUTTON/SUCTION CHANNEL WAS CLOGEED AND NEEDED TO BE                            IRRIGATED SEVERAL TIMES. The ileocecal valve,                            appendiceal orifice, and rectum were photographed. Scope In: 7:47:01 AM Scope Out: 8:32:06 AM Total Procedure Duration: 0 hours 45 minutes 5 seconds  Findings:      A 3 mm polyp was found in the cecum. The polyp was sessile. The polyp       was removed with a cold snare. Resection and retrieval were complete.      Multiple small and large-mouthed diverticula were found in the entire       colon.      The recto-sigmoid colon, sigmoid colon and descending colon were       moderately redundant. Area was tattooed with an injection of 1 mL of       Spot (carbon black) IN THE SIGMOID COLON TO MARK EXTENT OF GOOD EXAM.       ABLE TO OBTAIN GOOD EXAM WITH LAVAGE/ASPIRAION DISTAL TO THIS MARK AS       WELL.       Internal hemorrhoids were found. The hemorrhoids were small. Impression:               - One 3 mm polyp in the cecum, removed with a cold                            snare. Resected and retrieved.                           - Diverticulosis in the entire examined colon.                           - Redundant LEFT colon.                           - SMALL Internal hemorrhoids. Moderate Sedation:      Per Anesthesia Care Recommendation:           - Patient has a contact number available for                            emergencies. The signs and symptoms of potential                            delayed complications were discussed with the                            patient. Return to normal activities tomorrow.                            Written discharge instructions were provided to the                            patient.                           -  High fiber diet.                           - Await pathology results.                           - Continue present medications.                           - Repeat colonoscopy is not recommended due to                            current age (22 years or older) for surveillance.                           DRINK WATER TO KEEP URINE LIGHT YELLOW.                           USE PREPARATION H FOUR TIMES A DAY IF NEEDED TO                            RELIEVE RECTAL PAIN/PRESSURE/BLEEDING.                           IF PT DEVELOPS RECTAL BLEEDING, OR ANEMIA CONSIDER                            EGD/POSSIBLE COLONOSCOPY. SHE HAS HAD ADEQUATE, AGE                            APPROPRIATE, COLON CANCER SCREENING. Procedure Code(s):        --- Professional ---                           [id number], Colonoscopy, flexible; with removal of                            tumor(s), polyp(s), or other lesion(s) by snare                            technique                           45381, Colonoscopy, flexible; with directed                            submucosal injection(s), any  substance Diagnosis Code(s):        --- Professional ---                           Z12.11, Encounter for screening for malignant                            neoplasm of colon                           D12.0,  Benign neoplasm of cecum                           K64.8, Other hemorrhoids                           K57.30, Diverticulosis of large intestine without                            perforation or abscess without bleeding                           Q43.8, Other specified congenital malformations of                            intestine CPT copyright 2018 [employer]. All rights reserved. The codes documented in this report are preliminary and upon coder review may  be revised to meet current compliance requirements. Barney Drain, MD Barney Drain MD, MD 01/08/2018 8:49:57 AM This report has been signed electronically. Number of Addenda: 0

## 2018-01-08 NOTE — Anesthesia Postprocedure Evaluation (Signed)
Anesthesia Post Note  Patient: Andrea Santiago  Procedure(s) Performed: COLONOSCOPY WITH PROPOFOL (N/A ) POLYPECTOMY  Patient location during evaluation: PACU Anesthesia Type: General Level of consciousness: awake and alert and oriented Pain management: pain level controlled Vital Signs Assessment: post-procedure vital signs reviewed and stable Respiratory status: spontaneous breathing Cardiovascular status: blood pressure returned to baseline Postop Assessment: no apparent nausea or vomiting Anesthetic complications: no     Last Vitals:  Vitals:   01/08/18 0631 01/08/18 0838  BP: (!) 152/61   Pulse: 83 (P) 91  Resp: 18 (P) 20  Temp: 36.9 C (P) 36.4 C  SpO2: 99% (P) 98%    Last Pain:  Vitals:   01/08/18 0735  TempSrc:   PainSc: 0-No pain                 Royann Wildasin

## 2018-01-09 NOTE — Progress Notes (Signed)
Mailed letter with the info and asked pt to call us and let us know what cardiologist she would like to be referred to.

## 2018-01-09 NOTE — Progress Notes (Signed)
LMOM ( mobile) to call. Many rings and no answer on home phone.

## 2018-01-10 ENCOUNTER — Encounter: Payer: Self-pay | Admitting: Gastroenterology

## 2018-01-10 ENCOUNTER — Telehealth: Payer: Self-pay | Admitting: Gastroenterology

## 2018-01-10 NOTE — Telephone Encounter (Signed)
Called, many rings and no answer.

## 2018-01-10 NOTE — Telephone Encounter (Signed)
PATIENT SCHEDULED AND LETTER SENT  °

## 2018-01-10 NOTE — Telephone Encounter (Signed)
PATIENT CALLED AND LEFT MESSAGE THAT SHE WAS RETURNING A CALL. PLEASE CALL BACK

## 2018-01-10 NOTE — Telephone Encounter (Signed)
Please call pt. She had a polypoid lesion removed and it was benign.    DRINK WATER TO KEEP YOUR URINE LIGHT YELLOW.  FOLLOW A HIGH FIBER DIET. AVOID ITEMS THAT CAUSE BLOATING.   USE PREPARATION H FOUR TIMES  A DAY IF NEEDED TO RELIEVE RECTAL PAIN/PRESSURE/BLEEDING.   YOU HAVE HAD ADEQUATE, AGE APPROPRIATE, COLON CANCER SCREENING SO YOU DO NOT NEED TO HEME TEST STOOL OR COLOGARD, BUT IF YOU HAVE TROUBLE WITH RECTAL BLEEDING OR LOW BLOOD COUNT AFTER 2022 THEN YOU SHOULD HAVE ANOTHER COLONOSCOPY.

## 2018-01-10 NOTE — Telephone Encounter (Signed)
PT is aware of her EKG findings. She see's Dr. Jacinta Shoe and she will discuss with him. She cancelled her appt at end of Dec with Randall Hiss, she will be out of town. Marzetta Board, she wants an appt with Randall Hiss after the first of the year.  Pt said to tell Dr. Oneida Alar that she has not had a Bm since she had her procedure on Tues. I told her that is expected after she just got cleaned out for the procedure. She will drink lost of water and juices and let us know if she has any problems.

## 2018-01-10 NOTE — Telephone Encounter (Signed)
REVIEWED-NO ADDITIONAL RECOMMENDATIONS. 

## 2018-01-10 NOTE — Telephone Encounter (Signed)
Called [phone] and could not leave a message. Called [phone] and left Vm for a return call.

## 2018-01-11 ENCOUNTER — Encounter (HOSPITAL_COMMUNITY): Payer: Self-pay | Admitting: Gastroenterology

## 2018-01-11 NOTE — Telephone Encounter (Signed)
Pt is aware.  

## 2018-01-14 ENCOUNTER — Telehealth: Payer: Self-pay

## 2018-01-14 DIAGNOSIS — Z23 Encounter for immunization: Secondary | ICD-10-CM | POA: Diagnosis not present

## 2018-01-14 DIAGNOSIS — R9431 Abnormal electrocardiogram [ECG] [EKG]: Secondary | ICD-10-CM

## 2018-01-14 NOTE — Telephone Encounter (Signed)
Pt left Vm that she will need referral to cardiology for her abnormal EKG. She has not been seen by cardiologist for 3 years. Please refer to Dr. Jacinta Shoe.

## 2018-01-14 NOTE — Telephone Encounter (Signed)
Referral sent 

## 2018-01-15 ENCOUNTER — Telehealth: Payer: Self-pay | Admitting: Gastroenterology

## 2018-01-15 ENCOUNTER — Encounter: Payer: Self-pay | Admitting: Cardiovascular Disease

## 2018-01-15 NOTE — Telephone Encounter (Signed)
Referral was sent to cardiology yesterday and they have called pt x 1. Called and informed pt. States she doesn't have answering machine and was unaware they had tried to call her. She will call cardiology office.

## 2018-01-15 NOTE — Telephone Encounter (Signed)
PATIENT CALLED AND SAID SHE HAS NOT BEEN TO HER CARDIOLOGIST IN 3 YEARS AND THAT WE NEED TO REFER HER THERE AGAIN.  IT IS DR Bronson Ing

## 2018-01-15 NOTE — Telephone Encounter (Signed)
Noted  

## 2018-02-15 ENCOUNTER — Ambulatory Visit: Payer: Medicare Other | Admitting: Nurse Practitioner

## 2018-03-01 ENCOUNTER — Other Ambulatory Visit: Payer: Self-pay | Admitting: Nurse Practitioner

## 2018-03-01 DIAGNOSIS — K5900 Constipation, unspecified: Secondary | ICD-10-CM

## 2018-03-01 DIAGNOSIS — R1084 Generalized abdominal pain: Secondary | ICD-10-CM

## 2018-03-04 ENCOUNTER — Ambulatory Visit (INDEPENDENT_AMBULATORY_CARE_PROVIDER_SITE_OTHER): Payer: Medicare Other | Admitting: Cardiovascular Disease

## 2018-03-04 ENCOUNTER — Encounter: Payer: Self-pay | Admitting: Cardiovascular Disease

## 2018-03-04 VITALS — BP 126/70 | HR 85 | Ht 64.0 in | Wt 163.0 lb

## 2018-03-04 DIAGNOSIS — R011 Cardiac murmur, unspecified: Secondary | ICD-10-CM

## 2018-03-04 DIAGNOSIS — I10 Essential (primary) hypertension: Secondary | ICD-10-CM

## 2018-03-04 DIAGNOSIS — R9431 Abnormal electrocardiogram [ECG] [EKG]: Secondary | ICD-10-CM

## 2018-03-04 NOTE — Patient Instructions (Signed)
Medication Instructions:  Your physician recommends that you continue on your current medications as directed. Please refer to the Current Medication list given to you today.  If you need a refill on your cardiac medications before your next appointment, please call your pharmacy.   Lab work: None today If you have labs (blood work) drawn today and your tests are completely normal, you will receive your results only by: Marland Kitchen MyChart Message (if you have MyChart) OR . A paper copy in the mail If you have any lab test that is abnormal or we need to change your treatment, we will call you to review the results.  Testing/Procedures: Your physician has requested that you have an echocardiogram. Echocardiography is a painless test that uses sound waves to create images of your heart. It provides your doctor with information about the size and shape of your heart and how well your heart's chambers and valves are working. This procedure takes approximately one hour. There are no restrictions for this procedure.    Follow-Up: 6 months with Dr.Koneswaran At [facility], you and your health needs are our priority.  As part of our continuing mission to provide you with exceptional heart care, we have created designated Provider Care Teams.  These Care Teams include your primary Cardiologist (physician) and Advanced Practice Providers (APPs -  Physician Assistants and Nurse Practitioners) who all work together to provide you with the care you need, when you need it. .   Any Other Special Instructions Will Be Listed Below (If Applicable). None
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
## 2018-03-05 ENCOUNTER — Other Ambulatory Visit: Payer: Self-pay

## 2018-03-05 ENCOUNTER — Ambulatory Visit (HOSPITAL_COMMUNITY)
Admission: RE | Admit: 2018-03-05 | Discharge: 2018-03-05 | Disposition: A | Discharge status: Home / Self Care | Payer: Medicare Other | Source: Ambulatory Visit | Attending: Cardiovascular Disease | Admitting: Cardiovascular Disease

## 2018-03-05 DIAGNOSIS — I350 Nonrheumatic aortic (valve) stenosis: Secondary | ICD-10-CM

## 2018-03-05 DIAGNOSIS — R011 Cardiac murmur, unspecified: Secondary | ICD-10-CM | POA: Diagnosis not present

## 2018-03-05 DIAGNOSIS — R9431 Abnormal electrocardiogram [ECG] [EKG]: Secondary | ICD-10-CM | POA: Insufficient documentation

## 2018-03-05 NOTE — Progress Notes (Signed)
*  PRELIMINARY RESULTS* Echocardiogram 2D Echocardiogram has been performed.  Andrea Santiago 03/05/2018, 9:23 AM

## 2018-03-28 ENCOUNTER — Ambulatory Visit (INDEPENDENT_AMBULATORY_CARE_PROVIDER_SITE_OTHER): Payer: Medicare Other | Admitting: Family Medicine

## 2018-03-28 ENCOUNTER — Encounter: Payer: Self-pay | Admitting: Family Medicine

## 2018-03-28 VITALS — BP 134/70 | HR 89 | Resp 15 | Ht 64.0 in | Wt 164.0 lb

## 2018-03-28 DIAGNOSIS — E7849 Other hyperlipidemia: Secondary | ICD-10-CM

## 2018-03-28 DIAGNOSIS — E039 Hypothyroidism, unspecified: Secondary | ICD-10-CM | POA: Diagnosis not present

## 2018-03-28 DIAGNOSIS — I10 Essential (primary) hypertension: Secondary | ICD-10-CM | POA: Diagnosis not present

## 2018-03-28 DIAGNOSIS — K5900 Constipation, unspecified: Secondary | ICD-10-CM

## 2018-03-28 DIAGNOSIS — M25512 Pain in left shoulder: Secondary | ICD-10-CM

## 2018-03-28 DIAGNOSIS — Z9181 History of falling: Secondary | ICD-10-CM | POA: Diagnosis not present

## 2018-03-28 DIAGNOSIS — M797 Fibromyalgia: Secondary | ICD-10-CM

## 2018-03-28 DIAGNOSIS — F322 Major depressive disorder, single episode, severe without psychotic features: Secondary | ICD-10-CM | POA: Diagnosis not present

## 2018-03-28 DIAGNOSIS — R7303 Prediabetes: Secondary | ICD-10-CM

## 2018-03-28 MED ORDER — MELOXICAM 15 MG PO TABS: 15.0000 mg | ORAL_TABLET | Freq: Every day | ORAL | 0 refills | Status: DC

## 2018-03-28 MED ORDER — NIACIN ER (ANTIHYPERLIPIDEMIC) 1000 MG PO TBCR: 2000.0000 mg | EXTENDED_RELEASE_TABLET | Freq: Every day | ORAL | 1 refills | Status: DC

## 2018-03-28 MED ORDER — PREDNISONE 10 MG PO TABS: 10.0000 mg | ORAL_TABLET | Freq: Two times a day (BID) | ORAL | 0 refills | Status: DC

## 2018-03-28 MED ORDER — FAMOTIDINE 40 MG PO TABS: 40.0000 mg | ORAL_TABLET | Freq: Every day | ORAL | 0 refills | Status: DC

## 2018-03-28 MED ORDER — METHYLPREDNISOLONE ACETATE 80 MG/ML IJ SUSP
80.0000 mg | Freq: Once | INTRAMUSCULAR | Status: AC
  Administered 2018-03-28: 80 mg via INTRAMUSCULAR

## 2018-03-28 MED ORDER — HYDROCHLOROTHIAZIDE 25 MG PO TABS: 25.0000 mg | ORAL_TABLET | Freq: Every day | ORAL | 1 refills | Status: DC

## 2018-03-28 MED ORDER — KETOROLAC TROMETHAMINE 60 MG/2ML IM SOLN
60.0000 mg | Freq: Once | INTRAMUSCULAR | Status: AC
  Administered 2018-03-28: 60 mg via INTRAMUSCULAR

## 2018-03-28 MED ORDER — LUBIPROSTONE 24 MCG PO CAPS: ORAL_CAPSULE | ORAL | 1 refills | Status: DC

## 2018-03-28 MED ORDER — POTASSIUM CHLORIDE CRYS ER 10 MEQ PO TBCR: 30.0000 meq | EXTENDED_RELEASE_TABLET | Freq: Every day | ORAL | 1 refills | Status: DC

## 2018-03-28 NOTE — Patient Instructions (Addendum)
Wellness with nurse due 09/20/2018  F/U with MD in 4 months, call  if you need me sooner  Fasting lipid, cmp and EGFR, HBA1C and CBC in 3.5 months    Please be careful not to fall  injections in office todayToradol 60 mg and Depo Medrol 80 mg IM   for left shoulder pain and this will be followed by short course of medication, meloxicam , prednisone , pepcid  All the best with plans for relocation, this is best for you  Thank you  for choosing [facility]. We consider it a privelige to serve you.  Delivering excellent health care in a caring and  compassionate way is our goal.  Partnering with you,  so that together we can achieve this goal is our strategy.

## 2018-04-01 ENCOUNTER — Telehealth: Payer: Self-pay | Admitting: Family Medicine

## 2018-04-01 NOTE — Telephone Encounter (Signed)
pls advise it is recommended strongly she use EITHER diclofenac gel OR meloxicam tablet, nOT both as increased adverse s/e , needs to let you know which she will stop and please remove from med list. Let her know ins co sent message re safety concern also pls

## 2018-04-02 NOTE — Telephone Encounter (Signed)
Patient chose to stay on Meloxicam. Diclofenac has been removed from med list.

## 2018-04-02 NOTE — Telephone Encounter (Signed)
Thank you :)

## 2018-04-06 ENCOUNTER — Encounter: Payer: Self-pay | Admitting: Family Medicine

## 2018-04-06 NOTE — Assessment & Plan Note (Signed)
No medication change. Pt reports improvement once she opened up to her son about problems she has been having and she has also decided to relocate to [place] where she has family who will be supportive

## 2018-04-06 NOTE — Assessment & Plan Note (Addendum)
Managed by Rheumatology, chronic pain controlled on current regime. Regular exercise and stretches encouraged

## 2018-04-06 NOTE — Assessment & Plan Note (Signed)
Home safety is reviewed and fall risk reduction

## 2018-04-06 NOTE — Assessment & Plan Note (Signed)
Managed by Endo and controlled on current medication

## 2018-04-06 NOTE — Assessment & Plan Note (Signed)
Hyperlipidemia:Low fat diet discussed and encouraged.   Lipid Panel  Lab Results  Component Value Date   CHOL 210 (H) 12/31/2017   HDL 45 (L) 12/31/2017   LDLCALC 132 (H) 12/31/2017   LDLDIRECT 141 (H) 10/31/2007   TRIG 191 (H) 12/31/2017   CHOLHDL 4.7 12/31/2017   Uncontrolled, rept lab prior to next visit

## 2018-04-06 NOTE — Assessment & Plan Note (Signed)
Uncontrolled.Toradol and depo medrol administered IM in the office , to be followed by a short course of oral prednisone and NSAIDS.  

## 2018-04-06 NOTE — Assessment & Plan Note (Signed)
Controlled, no change in medication DASH diet and commitment to daily physical activity for a minimum of 30 minutes discussed and encouraged, as a part of hypertension management. The importance of attaining a healthy weight is also discussed.  BP/Weight 03/28/2018 03/04/2018 01/08/2018 01/02/2018 11/28/2017 11/12/2017 0/10/7947  Systolic BP 971 820 990 [phone]  Diastolic BP 70 70 74 56 76 66 78  Wt. (Lbs) 164 163 - 165 162.4 161.4 164  BMI 28.15 27.98 - 28.32 27.88 27.7 28.15

## 2018-04-06 NOTE — Progress Notes (Signed)
   Andrea Santiago     MRN: [id number]      DOB: [birth year]   HPI Andrea Santiago is here for follow up and re-evaluation of chronic medical conditions, medication management and review of any available recent lab and radiology data.  Preventive health is updated, specifically  Cancer screening and Immunization.   Questions or concerns regarding consultations or procedures which the PT has had in the interim are  addressed. The PT denies any adverse reactions to current medications since the last visit.  There are no new concerns.  There are no specific complaints   ROS Denies recent fever or chills. Denies sinus pressure, nasal congestion, ear pain or sore throat. Denies chest congestion, productive cough or wheezing. Denies chest pains, palpitations and leg swelling Denies abdominal pain, nausea, vomiting,diarrhea or constipation.   Denies dysuria, frequency, hesitancy or incontinence. Denies joint pain, swelling and limitation in mobility. Denies headaches, seizures, numbness, or tingling. Denies depression, anxiety or insomnia. Denies skin break down or rash.   PE  BP 134/70   Pulse 89   Resp 15   Ht 5\' 4"  (1.626 m)   Wt 164 lb (74.4 kg)   SpO2 96%   BMI 28.15 kg/m   Patient alert and oriented and in no cardiopulmonary distress.  HEENT: No facial asymmetry, EOMI,   oropharynx pink and moist.  Neck supple no JVD, no mass.  Chest: Clear to auscultation bilaterally.  CVS: S1, S2 no murmurs, no S3.Regular rate.  ABD: Soft non tender.   Ext: No edema  IW:[id number] spine, shoulders, hips and knees.  Skin: Intact, no ulcerations or rash noted.  Psych: Good eye contact, normal affect. Memory intact not anxious or depressed appearing.  CNS: CN 2-12 intact, power,  normal throughout.no focal deficits noted.   Assessment & Plan  Left shoulder pain Uncontrolled.Toradol and depo medrol administered IM in the office , to be followed by a short course of oral prednisone  and NSAIDS.   Essential hypertension Controlled, no change in medication DASH diet and commitment to daily physical activity for a minimum of 30 minutes discussed and encouraged, as a part of hypertension management. The importance of attaining a healthy weight is also discussed.  BP/Weight 03/28/2018 03/04/2018 01/08/2018 01/02/2018 11/28/2017 11/12/2017 05/29/1446  Systolic BP 185 631 497 026 378 588 502  Diastolic BP 70 70 74 56 76 66 78  Wt. (Lbs) 164 163 - 165 162.4 161.4 164  BMI 28.15 27.98 - 28.32 27.88 27.7 28.15       Depression, major, single episode, severe (HCC) No medication change. Pt reports improvement once she opened up to her son about problems she has been having and she has also decided to relocate to [place] where she has family who will be supportive  At high risk for falls Home safety is reviewed and fall risk reduction  Hypothyroidism Managed by Endo and controlled on current medication  Fibromyalgia Managed by Rheumatology, chronic pain controlled on current regime. Regular exercise and stretches encouraged  Hyperlipemia Hyperlipidemia:Low fat diet discussed and encouraged.   Lipid Panel  Lab Results  Component Value Date   CHOL 210 (H) 12/31/2017   HDL 45 (L) 12/31/2017   LDLCALC 132 (H) 12/31/2017   LDLDIRECT 141 (H) 10/31/2007   TRIG 191 (H) 12/31/2017   CHOLHDL 4.7 12/31/2017   Uncontrolled, rept lab prior to next visit

## 2018-04-07 ENCOUNTER — Other Ambulatory Visit: Payer: Self-pay | Admitting: "Endocrinology

## 2018-04-08 ENCOUNTER — Ambulatory Visit: Payer: Medicare Other | Admitting: Nurse Practitioner

## 2018-04-08 ENCOUNTER — Telehealth: Payer: Self-pay | Admitting: Gastroenterology

## 2018-04-08 ENCOUNTER — Encounter: Payer: Self-pay | Admitting: Gastroenterology
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
## 2018-04-08 NOTE — Telephone Encounter (Signed)
For EG

## 2018-04-08 NOTE — Telephone Encounter (Signed)
Patient was a no show and letter sent  °

## 2018-04-10 ENCOUNTER — Other Ambulatory Visit: Payer: Self-pay | Admitting: Family Medicine

## 2018-04-14 ENCOUNTER — Other Ambulatory Visit: Payer: Self-pay | Admitting: Family Medicine

## 2018-04-15 ENCOUNTER — Ambulatory Visit: Payer: Medicare Other | Admitting: "Endocrinology

## 2018-04-17 DIAGNOSIS — M5441 Lumbago with sciatica, right side: Secondary | ICD-10-CM | POA: Diagnosis not present

## 2018-04-17 DIAGNOSIS — M9901 Segmental and somatic dysfunction of cervical region: Secondary | ICD-10-CM | POA: Diagnosis not present

## 2018-04-17 DIAGNOSIS — M9902 Segmental and somatic dysfunction of thoracic region: Secondary | ICD-10-CM | POA: Diagnosis not present

## 2018-04-17 DIAGNOSIS — M9903 Segmental and somatic dysfunction of lumbar region: Secondary | ICD-10-CM | POA: Diagnosis not present

## 2018-04-17 DIAGNOSIS — M546 Pain in thoracic spine: Secondary | ICD-10-CM | POA: Diagnosis not present

## 2018-04-17 DIAGNOSIS — M542 Cervicalgia: Secondary | ICD-10-CM | POA: Diagnosis not present

## 2018-04-19 ENCOUNTER — Other Ambulatory Visit: Payer: Self-pay | Admitting: Family Medicine

## 2018-04-19 DIAGNOSIS — M546 Pain in thoracic spine: Secondary | ICD-10-CM | POA: Diagnosis not present

## 2018-04-19 DIAGNOSIS — M9902 Segmental and somatic dysfunction of thoracic region: Secondary | ICD-10-CM | POA: Diagnosis not present

## 2018-04-19 DIAGNOSIS — M5441 Lumbago with sciatica, right side: Secondary | ICD-10-CM | POA: Diagnosis not present

## 2018-04-19 DIAGNOSIS — M9903 Segmental and somatic dysfunction of lumbar region: Secondary | ICD-10-CM | POA: Diagnosis not present

## 2018-04-19 DIAGNOSIS — M542 Cervicalgia: Secondary | ICD-10-CM | POA: Diagnosis not present

## 2018-04-19 DIAGNOSIS — M9901 Segmental and somatic dysfunction of cervical region: Secondary | ICD-10-CM | POA: Diagnosis not present

## 2018-04-22 ENCOUNTER — Telehealth: Payer: Self-pay | Admitting: *Deleted

## 2018-04-22 DIAGNOSIS — M5441 Lumbago with sciatica, right side: Secondary | ICD-10-CM | POA: Diagnosis not present

## 2018-04-22 DIAGNOSIS — M542 Cervicalgia: Secondary | ICD-10-CM | POA: Diagnosis not present

## 2018-04-22 DIAGNOSIS — M546 Pain in thoracic spine: Secondary | ICD-10-CM | POA: Diagnosis not present

## 2018-04-22 DIAGNOSIS — M9903 Segmental and somatic dysfunction of lumbar region: Secondary | ICD-10-CM | POA: Diagnosis not present

## 2018-04-22 DIAGNOSIS — M9902 Segmental and somatic dysfunction of thoracic region: Secondary | ICD-10-CM | POA: Diagnosis not present

## 2018-04-22 DIAGNOSIS — M9901 Segmental and somatic dysfunction of cervical region: Secondary | ICD-10-CM | POA: Diagnosis not present

## 2018-04-22 NOTE — Telephone Encounter (Signed)
Pt just wanted to let Velna Hatchet know that the counselor that she was seeing was Rodney Cruise. The phone number was [phone] and the address is [address] [place] [address].

## 2018-04-23 NOTE — Telephone Encounter (Signed)
noted 

## 2018-04-29 ENCOUNTER — Other Ambulatory Visit: Payer: Self-pay | Admitting: Family Medicine

## 2018-04-29 ENCOUNTER — Other Ambulatory Visit: Payer: Self-pay

## 2018-04-29 ENCOUNTER — Telehealth: Payer: Self-pay

## 2018-04-29 DIAGNOSIS — E7849 Other hyperlipidemia: Secondary | ICD-10-CM | POA: Diagnosis not present

## 2018-04-29 DIAGNOSIS — E039 Hypothyroidism, unspecified: Secondary | ICD-10-CM | POA: Diagnosis not present

## 2018-04-29 DIAGNOSIS — M546 Pain in thoracic spine: Secondary | ICD-10-CM | POA: Diagnosis not present

## 2018-04-29 DIAGNOSIS — R7303 Prediabetes: Secondary | ICD-10-CM | POA: Diagnosis not present

## 2018-04-29 DIAGNOSIS — M542 Cervicalgia: Secondary | ICD-10-CM | POA: Diagnosis not present

## 2018-04-29 DIAGNOSIS — I10 Essential (primary) hypertension: Secondary | ICD-10-CM | POA: Diagnosis not present

## 2018-04-29 DIAGNOSIS — M9902 Segmental and somatic dysfunction of thoracic region: Secondary | ICD-10-CM | POA: Diagnosis not present

## 2018-04-29 DIAGNOSIS — M9903 Segmental and somatic dysfunction of lumbar region: Secondary | ICD-10-CM | POA: Diagnosis not present

## 2018-04-29 DIAGNOSIS — M5441 Lumbago with sciatica, right side: Secondary | ICD-10-CM | POA: Diagnosis not present

## 2018-04-29 DIAGNOSIS — M9901 Segmental and somatic dysfunction of cervical region: Secondary | ICD-10-CM | POA: Diagnosis not present

## 2018-04-29 NOTE — Telephone Encounter (Signed)
LeighAnn Finley Dinkel, CMA  

## 2018-04-30 ENCOUNTER — Telehealth: Payer: Self-pay

## 2018-04-30 DIAGNOSIS — R7303 Prediabetes: Secondary | ICD-10-CM

## 2018-04-30 DIAGNOSIS — E7849 Other hyperlipidemia: Secondary | ICD-10-CM

## 2018-04-30 DIAGNOSIS — I10 Essential (primary) hypertension: Secondary | ICD-10-CM

## 2018-04-30 DIAGNOSIS — I25119 Atherosclerotic heart disease of native coronary artery with unspecified angina pectoris: Secondary | ICD-10-CM

## 2018-04-30 LAB — CBC
HCT: 41.1 % (ref 35.0–45.0)
Hemoglobin: 13.9 g/dL (ref 11.7–15.5)
MCH: 30.2 pg (ref 27.0–33.0)
MCHC: 33.8 g/dL (ref 32.0–36.0)
MCV: 89.2 fL (ref 80.0–100.0)
MPV: 9.9 fL (ref 7.5–12.5)
Platelets: 345 10*3/uL (ref 140–400)
RBC: 4.61 10*6/uL (ref 3.80–5.10)
RDW: 13.5 % (ref 11.0–15.0)
WBC: 7 10*3/uL (ref 3.8–10.8)

## 2018-04-30 LAB — COMPLETE METABOLIC PANEL WITH GFR
AG Ratio: 1.5 (calc) (ref 1.0–2.5)
ALT: 21 U/L (ref 6–29)
AST: 22 U/L (ref 10–35)
Albumin: 4.4 g/dL (ref 3.6–5.1)
Alkaline phosphatase (APISO): 67 U/L (ref 37–153)
BUN: 22 mg/dL (ref 7–25)
CO2: 24 mmol/L (ref 20–32)
Calcium: 9.6 mg/dL (ref 8.6–10.4)
Chloride: 105 mmol/L (ref 98–110)
Creat: 0.82 mg/dL (ref 0.60–0.93)
GFR, Est African American: 79 mL/min/{1.73_m2} (ref >=60)
GFR, Est Non African American: 68 mL/min/{1.73_m2} (ref >=60)
Globulin: 2.9 g/dL (calc) (ref 1.9–3.7)
Glucose, Bld: 116 mg/dL — ABNORMAL HIGH (ref 65–99)
Potassium: 3.8 mmol/L (ref 3.5–5.3)
Sodium: 140 mmol/L (ref 135–146)
Total Bilirubin: 0.4 mg/dL (ref 0.2–1.2)
Total Protein: 7.3 g/dL (ref 6.1–8.1)

## 2018-04-30 LAB — TSH: TSH: 1.24 mIU/L (ref 0.40–4.50)

## 2018-04-30 LAB — HEMOGLOBIN A1C
Hgb A1c MFr Bld: 6.4 % of total Hgb — ABNORMAL HIGH (ref <5.7)
Mean Plasma Glucose: 137 (calc)
eAG (mmol/L): 7.6 (calc)

## 2018-04-30 LAB — LIPID PANEL
Cholesterol: 212 mg/dL — ABNORMAL HIGH (ref <200)
HDL: 53 mg/dL (ref >=50)
LDL Cholesterol (Calc): 132 mg/dL (calc) — ABNORMAL HIGH
Non-HDL Cholesterol (Calc): 159 mg/dL (calc) — ABNORMAL HIGH (ref <130)
Total CHOL/HDL Ratio: 4 (calc) (ref <5.0)
Triglycerides: 154 mg/dL — ABNORMAL HIGH (ref <150)

## 2018-04-30 LAB — T4, FREE: Free T4: 1.3 ng/dL (ref 0.8–1.8)

## 2018-04-30 NOTE — Telephone Encounter (Signed)
Labs ordered to be drawn in 4.5 months

## 2018-05-08 ENCOUNTER — Ambulatory Visit (INDEPENDENT_AMBULATORY_CARE_PROVIDER_SITE_OTHER): Payer: Medicare Other | Admitting: "Endocrinology

## 2018-05-08 ENCOUNTER — Other Ambulatory Visit: Payer: Self-pay

## 2018-05-08 ENCOUNTER — Encounter: Payer: Self-pay | Admitting: "Endocrinology

## 2018-05-08 VITALS — BP 137/77 | HR 88 | Ht 64.0 in | Wt 164.0 lb

## 2018-05-08 DIAGNOSIS — M9901 Segmental and somatic dysfunction of cervical region: Secondary | ICD-10-CM | POA: Diagnosis not present

## 2018-05-08 DIAGNOSIS — I25119 Atherosclerotic heart disease of native coronary artery with unspecified angina pectoris: Secondary | ICD-10-CM | POA: Diagnosis not present

## 2018-05-08 DIAGNOSIS — E042 Nontoxic multinodular goiter: Secondary | ICD-10-CM | POA: Diagnosis not present

## 2018-05-08 DIAGNOSIS — E039 Hypothyroidism, unspecified: Secondary | ICD-10-CM | POA: Diagnosis not present

## 2018-05-08 DIAGNOSIS — M542 Cervicalgia: Secondary | ICD-10-CM | POA: Diagnosis not present

## 2018-05-08 DIAGNOSIS — M9902 Segmental and somatic dysfunction of thoracic region: Secondary | ICD-10-CM | POA: Diagnosis not present

## 2018-05-08 DIAGNOSIS — M5441 Lumbago with sciatica, right side: Secondary | ICD-10-CM | POA: Diagnosis not present

## 2018-05-08 DIAGNOSIS — M9903 Segmental and somatic dysfunction of lumbar region: Secondary | ICD-10-CM | POA: Diagnosis not present

## 2018-05-08 DIAGNOSIS — M546 Pain in thoracic spine: Secondary | ICD-10-CM | POA: Diagnosis not present

## 2018-05-08 MED ORDER — LEVOTHYROXINE SODIUM 88 MCG PO TABS: 88.0000 ug | ORAL_TABLET | Freq: Every day | ORAL | 3 refills | Status: DC
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
## 2018-05-15 ENCOUNTER — Other Ambulatory Visit: Payer: Self-pay | Admitting: Family Medicine

## 2018-05-30 ENCOUNTER — Ambulatory Visit: Payer: Medicare Other | Admitting: Rheumatology

## 2018-06-05 ENCOUNTER — Ambulatory Visit: Payer: Medicare Other | Admitting: Rheumatology

## 2018-06-10 ENCOUNTER — Encounter: Payer: Self-pay | Admitting: Family Medicine

## 2018-06-14 ENCOUNTER — Encounter: Payer: Self-pay | Admitting: Gastroenterology

## 2018-06-14 ENCOUNTER — Other Ambulatory Visit: Payer: Self-pay

## 2018-06-14 ENCOUNTER — Ambulatory Visit (INDEPENDENT_AMBULATORY_CARE_PROVIDER_SITE_OTHER): Payer: Medicare Other | Admitting: Gastroenterology

## 2018-06-14 DIAGNOSIS — K5900 Constipation, unspecified: Secondary | ICD-10-CM | POA: Diagnosis not present

## 2018-06-14 MED ORDER — PLECANATIDE 3 MG PO TABS: 3.0000 mg | ORAL_TABLET | Freq: Every day | ORAL | 5 refills | Status: DC

## 2018-06-14 NOTE — Patient Instructions (Signed)
1. Stop Amitiza.  2. Try Trulance once daily for constipation. If it works, you have a prescription waiting at the pharmacy.  3. I have also provided Linzess 257mcg samples in case insurance will not pay for Trulance of in case Trulance doesn't work. If you take Linzess, take once daily on an empty stomach.  4. Do not take both Trulance and Linzess.  5. Return to the office in six months.
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
## 2018-06-14 NOTE — Progress Notes (Signed)
CC'ED TO PCP 

## 2018-06-17 ENCOUNTER — Encounter: Payer: Self-pay | Admitting: Gastroenterology

## 2018-07-03 ENCOUNTER — Other Ambulatory Visit: Payer: Self-pay | Admitting: Family Medicine

## 2018-07-19 DIAGNOSIS — Z961 Presence of intraocular lens: Secondary | ICD-10-CM | POA: Diagnosis not present

## 2018-07-25 ENCOUNTER — Ambulatory Visit: Payer: Medicare Other | Admitting: Family Medicine

## 2018-07-30 ENCOUNTER — Other Ambulatory Visit: Payer: Self-pay

## 2018-07-30 ENCOUNTER — Ambulatory Visit (INDEPENDENT_AMBULATORY_CARE_PROVIDER_SITE_OTHER): Payer: Medicare Other | Admitting: Family Medicine

## 2018-07-30 ENCOUNTER — Ambulatory Visit: Payer: Medicare Other | Admitting: Family Medicine

## 2018-07-30 ENCOUNTER — Encounter (INDEPENDENT_AMBULATORY_CARE_PROVIDER_SITE_OTHER): Payer: Self-pay

## 2018-07-30 ENCOUNTER — Encounter: Payer: Self-pay | Admitting: Family Medicine

## 2018-07-30 VITALS — BP 137/77 | Ht 64.0 in | Wt 164.0 lb

## 2018-07-30 DIAGNOSIS — R51 Headache: Secondary | ICD-10-CM

## 2018-07-30 DIAGNOSIS — Z1231 Encounter for screening mammogram for malignant neoplasm of breast: Secondary | ICD-10-CM

## 2018-07-30 DIAGNOSIS — R110 Nausea: Secondary | ICD-10-CM

## 2018-07-30 DIAGNOSIS — E663 Overweight: Secondary | ICD-10-CM

## 2018-07-30 DIAGNOSIS — I10 Essential (primary) hypertension: Secondary | ICD-10-CM

## 2018-07-30 DIAGNOSIS — K219 Gastro-esophageal reflux disease without esophagitis: Secondary | ICD-10-CM

## 2018-07-30 DIAGNOSIS — R7303 Prediabetes: Secondary | ICD-10-CM

## 2018-07-30 DIAGNOSIS — R519 Headache, unspecified: Secondary | ICD-10-CM

## 2018-07-30 DIAGNOSIS — F322 Major depressive disorder, single episode, severe without psychotic features: Secondary | ICD-10-CM | POA: Diagnosis not present

## 2018-07-30 MED ORDER — PREDNISONE 10 MG PO TABS: ORAL_TABLET | ORAL | 0 refills | Status: DC

## 2018-07-30 MED ORDER — ONDANSETRON HCL 4 MG PO TABS: 4.0000 mg | ORAL_TABLET | Freq: Three times a day (TID) | ORAL | 0 refills | Status: DC | PRN

## 2018-07-30 MED ORDER — BUTALBITAL-APAP-CAFFEINE 50-325-40 MG PO TABS: 1.0000 | ORAL_TABLET | Freq: Four times a day (QID) | ORAL | 1 refills | Status: DC | PRN

## 2018-07-30 MED ORDER — MECLIZINE HCL 25 MG PO TABS: 25.0000 mg | ORAL_TABLET | Freq: Three times a day (TID) | ORAL | 0 refills | Status: DC | PRN

## 2018-07-30 NOTE — Patient Instructions (Addendum)
Annual physical exam with MD past due , please schedule in July, call if you need me sooner  Mammogram scheduled in August when due , please keep appointment  Medication is prescribed for headache and nausea, if no relief, please call to come in for injections in the next 1to 2  week , as we discussed. If headache worsens please  Go to the ED   Please get fasting labs ordered in February ( order re mailed) 1 week before your July appointment  Social distancing. Frequent hand washing with soap and water Keeping your hands off of your face. These 3 practices will help to keep both you and your community healthy during this time. Please practice them faithfully!   Thanks for choosing [facility], we consider it a privelige to serve you.

## 2018-07-30 NOTE — Progress Notes (Signed)
Virtual Visit via Telephone Note  I connected with Andrea Santiago on 07/30/18 at 11:00 AM EDT by telephone and verified that I am speaking with the correct person using two identifiers.  Location: Patient: home Provider: office   I discussed the limitations, risks, security and privacy concerns of performing an evaluation and management service by telephone and the availability of in person appointments. I also discussed with the patient that there may be a patient responsible charge related to this service. The patient expressed understanding and agreed to proceed. This visit type is conducted due to national recommendations for restrictions regarding the COVID -19 Pandemic. Due to the patient's age and / or co morbidities, this format is felt to be most appropriate at this time without adequate follow up. The patient has no access to video technology/ had technical difficulties with video, requiring transitioning to audio format  only ( telephone ). All issues noted this document were discussed and addressed,no physical exam can be performed in this format.    History of Present Illness: C/o disabling migraine for 1 week, keeping her bed confined, denies any new neurologic symptoms Denies recent fever or chills. Denies sinus pressure, nasal congestion, ear pain or sore throat. Denies chest congestion, productive cough or wheezing. Denies chest pains, palpitations and leg swelling Denies abdominal pain, vomiting,diarrhea or constipation.  C/o nausea with headache Denies dysuria, frequency, hesitancy or incontinence. Denies uncontrolled joint pain, swelling and limitation in mobility.  Denies uncontrolled  depression, anxiety or insomnia. Denies skin break down or rash.       Observations/Objective: BP 137/77   Ht 5\' 4"  (1.626 m)   Wt 164 lb (74.4 kg)   BMI 28.15 kg/m  Good communication with no confusion and intact memory. Alert and oriented x 3 No signs of respiratory  distress during speech    Assessment and Plan:  Headache disorder Increased and uncontrolled symptoms x 1 week, fioricet prescribed and advised Ed if worsens  Essential hypertension Controlled, no change in medication DASH diet and commitment to daily physical activity for a minimum of 30 minutes discussed and encouraged, as a part of hypertension management. The importance of attaining a healthy weight is also discussed.  BP/Weight 07/30/2018 05/08/2018 03/28/2018 03/04/2018 01/08/2018 01/02/2018 88/06/275  Systolic BP 412 878 676 720 947 096 283  Diastolic BP 77 77 70 70 74 56 76  Wt. (Lbs) 164 164 164 163 - 165 162.4  BMI 28.15 28.15 28.15 27.98 - 28.32 27.88       Depression, major, single episode, severe (HCC) Controlled, no change in medication   Overweight  Patient re-educated about  the importance of commitment to a  minimum of 150 minutes of exercise per week as able.  The importance of healthy food choices with portion control discussed, as well as eating regularly and within a 12 hour window most days. The need to choose "clean , green" food 50 to 75% of the time is discussed, as well as to make water the primary drink and set a goal of 64 ounces water daily.    Weight /BMI 07/30/2018 05/08/2018 03/28/2018  WEIGHT 164 lb 164 lb 164 lb  HEIGHT 5\' 4"  5\' 4"  5\' 4"   BMI 28.15 kg/m2 28.15 kg/m2 28.15 kg/m2      Prediabetes Patient educated about the importance of limiting  Carbohydrate intake , the need to commit to daily physical activity for a minimum of 30 minutes , and to commit weight loss. The fact that changes in  all these areas will reduce or eliminate all together the development of diabetes is stressed.  Updated lab needed at/ before next visit.   Diabetic Labs Latest Ref Rng & Units 04/29/2018 12/31/2017 04/06/2017 09/23/2016 03/28/2016  HbA1c <5.7 % of total Hgb 6.4(H) - 6.0(H) - 5.7(H)  Chol <200 mg/dL 212(H) 210(H) - 173 255(H)  HDL > OR = 50 mg/dL 53 45(L) -  47(L) 47(L)  Calc LDL mg/dL (calc) 132(H) 132(H) - 91 159(H)  Triglycerides <150 mg/dL 154(H) 191(H) - 176(H) 243(H)  Creatinine 0.60 - 0.93 mg/dL 0.82 0.66 0.73 0.77 0.90   BP/Weight 07/30/2018 05/08/2018 03/28/2018 03/04/2018 01/08/2018 01/02/2018 83/09/1838  Systolic BP 375 436 067 703 403 524 818  Diastolic BP 77 77 70 70 74 56 76  Wt. (Lbs) 164 164 164 163 - 165 162.4  BMI 28.15 28.15 28.15 27.98 - 28.32 27.88   No flowsheet data found.    GERD Controlled, no change in medication   Nausea Episodic and accompanies  Headache, no emesis. Zofran prescribed for as needed use   Follow Up Instructions:    I discussed the assessment and treatment plan with the patient. The patient was provided an opportunity to ask questions and all were answered. The patient agreed with the plan and demonstrated an understanding of the instructions.   The patient was advised to call back or seek an in-person evaluation if the symptoms worsen or if the condition fails to improve as anticipated.  I provided 25 minutes of non-face-to-face time during this encounter.   Tula Nakayama, MD

## 2018-08-04 ENCOUNTER — Encounter: Payer: Self-pay | Admitting: Family Medicine

## 2018-08-04 DIAGNOSIS — R110 Nausea: Secondary | ICD-10-CM | POA: Insufficient documentation

## 2018-08-04 NOTE — Assessment & Plan Note (Signed)
Controlled, no change in medication DASH diet and commitment to daily physical activity for a minimum of 30 minutes discussed and encouraged, as a part of hypertension management. The importance of attaining a healthy weight is also discussed.  BP/Weight 07/30/2018 05/08/2018 03/28/2018 03/04/2018 01/08/2018 01/02/2018 97/08/4140  Systolic BP 395 320 233 435 686 168 372  Diastolic BP 77 77 70 70 74 56 76  Wt. (Lbs) 164 164 164 163 - 165 162.4  BMI 28.15 28.15 28.15 27.98 - 28.32 27.88

## 2018-08-04 NOTE — Assessment & Plan Note (Signed)
Patient educated about the importance of limiting  Carbohydrate intake , the need to commit to daily physical activity for a minimum of 30 minutes , and to commit weight loss. The fact that changes in all these areas will reduce or eliminate all together the development of diabetes is stressed.  Updated lab needed at/ before next visit.   Diabetic Labs Latest Ref Rng & Units 04/29/2018 12/31/2017 04/06/2017 09/23/2016 03/28/2016  HbA1c <5.7 % of total Hgb 6.4(H) - 6.0(H) - 5.7(H)  Chol <200 mg/dL 212(H) 210(H) - 173 255(H)  HDL > OR = 50 mg/dL 53 45(L) - 47(L) 47(L)  Calc LDL mg/dL (calc) 132(H) 132(H) - 91 159(H)  Triglycerides <150 mg/dL 154(H) 191(H) - 176(H) 243(H)  Creatinine 0.60 - 0.93 mg/dL 0.82 0.66 0.73 0.77 0.90   BP/Weight 07/30/2018 05/08/2018 03/28/2018 03/04/2018 01/08/2018 01/02/2018 70/02/7791  Systolic BP 903 009 233 007 622 633 354  Diastolic BP 77 77 70 70 74 56 76  Wt. (Lbs) 164 164 164 163 - 165 162.4  BMI 28.15 28.15 28.15 27.98 - 28.32 27.88   No flowsheet data found.

## 2018-08-04 NOTE — Assessment & Plan Note (Signed)
Episodic and accompanies  Headache, no emesis. Zofran prescribed for as needed use

## 2018-08-04 NOTE — Assessment & Plan Note (Signed)
  Patient re-educated about  the importance of commitment to a  minimum of 150 minutes of exercise per week as able.  The importance of healthy food choices with portion control discussed, as well as eating regularly and within a 12 hour window most days. The need to choose "clean , green" food 50 to 75% of the time is discussed, as well as to make water the primary drink and set a goal of 64 ounces water daily.    Weight /BMI 07/30/2018 05/08/2018 03/28/2018  WEIGHT 164 lb 164 lb 164 lb  HEIGHT 5\' 4"  5\' 4"  5\' 4"   BMI 28.15 kg/m2 28.15 kg/m2 28.15 kg/m2

## 2018-08-04 NOTE — Assessment & Plan Note (Signed)
Controlled, no change in medication  

## 2018-08-04 NOTE — Assessment & Plan Note (Signed)
Increased and uncontrolled symptoms x 1 week, fioricet prescribed and advised Ed if worsens

## 2018-08-05 ENCOUNTER — Other Ambulatory Visit: Payer: Self-pay | Admitting: Family Medicine

## 2018-08-08 ENCOUNTER — Other Ambulatory Visit: Payer: Self-pay | Admitting: Rheumatology
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
## 2018-08-08 NOTE — Telephone Encounter (Signed)
Last Visit: 11/28/17 Next Visit: 08/21/18  Okay to refill per Dr. Estanislado Pandy

## 2018-08-21 ENCOUNTER — Ambulatory Visit: Payer: Self-pay | Admitting: Rheumatology

## 2018-09-04 ENCOUNTER — Encounter: Payer: Medicare Other | Admitting: Family Medicine

## 2018-09-12 ENCOUNTER — Encounter: Payer: Medicare Other | Admitting: Family Medicine

## 2018-09-23 ENCOUNTER — Other Ambulatory Visit: Payer: Self-pay | Admitting: Family Medicine

## 2018-09-23 ENCOUNTER — Ambulatory Visit: Payer: Medicare Other

## 2018-09-24 ENCOUNTER — Ambulatory Visit: Payer: Medicaid Other | Admitting: Family Medicine

## 2018-09-24 ENCOUNTER — Encounter: Payer: Medicare Other | Admitting: Family Medicine

## 2018-09-27 ENCOUNTER — Encounter (INDEPENDENT_AMBULATORY_CARE_PROVIDER_SITE_OTHER): Payer: Self-pay

## 2018-09-27 ENCOUNTER — Other Ambulatory Visit: Payer: Self-pay

## 2018-09-27 ENCOUNTER — Encounter: Payer: Medicare Other | Admitting: Family Medicine

## 2018-10-01 ENCOUNTER — Encounter: Payer: Self-pay | Admitting: Family Medicine

## 2018-10-01 ENCOUNTER — Other Ambulatory Visit: Payer: Self-pay

## 2018-10-01 ENCOUNTER — Ambulatory Visit (INDEPENDENT_AMBULATORY_CARE_PROVIDER_SITE_OTHER): Payer: Medicare Other | Admitting: Family Medicine

## 2018-10-01 VITALS — BP 137/77 | HR 88 | Resp 14 | Ht 64.0 in | Wt 164.0 lb

## 2018-10-01 DIAGNOSIS — Z0000 Encounter for general adult medical examination without abnormal findings: Secondary | ICD-10-CM
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
## 2018-10-01 NOTE — Patient Instructions (Signed)
[name] , Thank you for taking time to come for your Medicare Wellness Visit. I appreciate your ongoing commitment to your health goals. Please review the following plan we discussed and let me know if I can assist you in the future.   Please continue to practice social distancing to keep you, your family, and our community safe.  If you must go out, please wear a Mask and practice good handwashing.  Screening recommendations/referrals: Colonoscopy: Due 2024  Mammogram: Due 09/2018 Bone Density:  Completed Recommended yearly ophthalmology/optometry visit for glaucoma screening and checkup Recommended yearly dental visit for hygiene and checkup  Vaccinations: Influenza vaccine: Due Fall 2020 Pneumococcal vaccine: Completed Tdap vaccine: Due 2021 Shingles vaccine: Completed  Advanced directives: You reported you had this, please bring a copy in for [place] to scan to the chart  Conditions/risks identified: FALLS  Next appointment: 10/03/2018    Preventive Care 36 Years and Older, Female Preventive care refers to lifestyle choices and visits with your health care provider that can promote health and wellness. What does preventive care include?  A yearly physical exam. This is also called an annual well check.  Dental exams once or twice a year.  Routine eye exams. Ask your health care provider how often you should have your eyes checked.  Personal lifestyle choices, including:  Daily care of your teeth and gums.  Regular physical activity.  Eating a healthy diet.  Avoiding tobacco and drug use.  Limiting alcohol use.  Practicing safe sex.  Taking low-dose aspirin every day.  Taking vitamin and mineral supplements as recommended by your health care provider. What happens during an annual well check? The services and screenings done by your health care provider during your annual well check will depend on your age, overall health, lifestyle risk factors, and family  history of disease. Counseling  Your health care provider may ask you questions about your:  Alcohol use.  Tobacco use.  Drug use.  Emotional well-being.  Home and relationship well-being.  Sexual activity.  Eating habits.  History of falls.  Memory and ability to understand (cognition).  Work and work Statistician.  Reproductive health. Screening  You may have the following tests or measurements:  Height, weight, and BMI.  Blood pressure.  Lipid and cholesterol levels. These may be checked every 5 years, or more frequently if you are over 45 years old.  Skin check.  Lung cancer screening. You may have this screening every year starting at age 31 if you have a 30-pack-year history of smoking and currently smoke or have quit within the past 15 years.  Fecal occult blood test (FOBT) of the stool. You may have this test every year starting at age 92.  Flexible sigmoidoscopy or colonoscopy. You may have a sigmoidoscopy every 5 years or a colonoscopy every 10 years starting at age 35.  Hepatitis C blood test.  Hepatitis B blood test.  Sexually transmitted disease (STD) testing.  Diabetes screening. This is done by checking your blood sugar (glucose) after you have not eaten for a while (fasting). You may have this done every 1-3 years.  Bone density scan. This is done to screen for osteoporosis. You may have this done starting at age 21.  Mammogram. This may be done every 1-2 years. Talk to your health care provider about how often you should have regular mammograms. Talk with your health care provider about your test results, treatment options, and if necessary, the need for more tests. Vaccines  Your  health care provider may recommend certain vaccines, such as:  Influenza vaccine. This is recommended every year.  Tetanus, diphtheria, and acellular pertussis (Tdap, Td) vaccine. You may need a Td booster every 10 years.  Zoster vaccine. You may need this after  age 14.  Pneumococcal 13-valent conjugate (PCV13) vaccine. One dose is recommended after age 36.  Pneumococcal polysaccharide (PPSV23) vaccine. One dose is recommended after age 34. Talk to your health care provider about which screenings and vaccines you need and how often you need them. This information is not intended to replace advice given to you by your health care provider. Make sure you discuss any questions you have with your health care provider. Document Released: 03/05/2015 Document Revised: 10/27/2015 Document Reviewed: 12/08/2014 Elsevier Interactive Patient Education  2017 [place] Prevention in the Home Falls can cause injuries. They can happen to people of all ages. There are many things you can do to make your home safe and to help prevent falls. What can I do on the outside of my home?  Regularly fix the edges of walkways and driveways and fix any cracks.  Remove anything that might make you trip as you walk through a door, such as a raised step or threshold.  Trim any bushes or trees on the path to your home.  Use bright outdoor lighting.  Clear any walking paths of anything that might make someone trip, such as rocks or tools.  Regularly check to see if handrails are loose or broken. Make sure that both sides of any steps have handrails.  Any raised decks and porches should have guardrails on the edges.  Have any leaves, snow, or ice cleared regularly.  Use sand or salt on walking paths during winter.  Clean up any spills in your garage right away. This includes oil or grease spills. What can I do in the bathroom?  Use night lights.  Install grab bars by the toilet and in the tub and shower. Do not use towel bars as grab bars.  Use non-skid mats or decals in the tub or shower.  If you need to sit down in the shower, use a plastic, non-slip stool.  Keep the floor dry. Clean up any water that spills on the floor as soon as it happens.   Remove soap buildup in the tub or shower regularly.  Attach bath mats securely with double-sided non-slip rug tape.  Do not have throw rugs and other things on the floor that can make you trip. What can I do in the bedroom?  Use night lights.  Make sure that you have a light by your bed that is easy to reach.  Do not use any sheets or blankets that are too big for your bed. They should not hang down onto the floor.  Have a firm chair that has side arms. You can use this for support while you get dressed.  Do not have throw rugs and other things on the floor that can make you trip. What can I do in the kitchen?  Clean up any spills right away.  Avoid walking on wet floors.  Keep items that you use a lot in easy-to-reach places.  If you need to reach something above you, use a strong step stool that has a grab bar.  Keep electrical cords out of the way.  Do not use floor polish or wax that makes floors slippery. If you must use wax, use non-skid floor wax.  Do not  have throw rugs and other things on the floor that can make you trip. What can I do with my stairs?  Do not leave any items on the stairs.  Make sure that there are handrails on both sides of the stairs and use them. Fix handrails that are broken or loose. Make sure that handrails are as long as the stairways.  Check any carpeting to make sure that it is firmly attached to the stairs. Fix any carpet that is loose or worn.  Avoid having throw rugs at the top or bottom of the stairs. If you do have throw rugs, attach them to the floor with carpet tape.  Make sure that you have a light switch at the top of the stairs and the bottom of the stairs. If you do not have them, ask someone to add them for you. What else can I do to help prevent falls?  Wear shoes that:  Do not have high heels.  Have rubber bottoms.  Are comfortable and fit you well.  Are closed at the toe. Do not wear sandals.  If you use a  stepladder:  Make sure that it is fully opened. Do not climb a closed stepladder.  Make sure that both sides of the stepladder are locked into place.  Ask someone to hold it for you, if possible.  Clearly mark and make sure that you can see:  Any grab bars or handrails.  First and last steps.  Where the edge of each step is.  Use tools that help you move around (mobility aids) if they are needed. These include:  Canes.  Walkers.  Scooters.  Crutches.  Turn on the lights when you go into a dark area. Replace any light bulbs as soon as they burn out.  Set up your furniture so you have a clear path. Avoid moving your furniture around.  If any of your floors are uneven, fix them.  If there are any pets around you, be aware of where they are.  Review your medicines with your doctor. Some medicines can make you feel dizzy. This can increase your chance of falling. Ask your doctor what other things that you can do to help prevent falls. This information is not intended to replace advice given to you by your health care provider. Make sure you discuss any questions you have with your health care provider. Document Released: 12/03/2008 Document Revised: 07/15/2015 Document Reviewed: 03/13/2014 Elsevier Interactive Patient Education  2017 [employer].

## 2018-10-02 ENCOUNTER — Ambulatory Visit (HOSPITAL_COMMUNITY): Payer: Medicare Other

## 2018-10-02 DIAGNOSIS — I10 Essential (primary) hypertension: Secondary | ICD-10-CM | POA: Diagnosis not present

## 2018-10-02 DIAGNOSIS — I25119 Atherosclerotic heart disease of native coronary artery with unspecified angina pectoris: Secondary | ICD-10-CM | POA: Diagnosis not present

## 2018-10-02 DIAGNOSIS — E7849 Other hyperlipidemia: Secondary | ICD-10-CM | POA: Diagnosis not present

## 2018-10-02 DIAGNOSIS — R7303 Prediabetes: Secondary | ICD-10-CM | POA: Diagnosis not present

## 2018-10-03 ENCOUNTER — Other Ambulatory Visit: Payer: Self-pay

## 2018-10-03 ENCOUNTER — Ambulatory Visit (INDEPENDENT_AMBULATORY_CARE_PROVIDER_SITE_OTHER): Payer: Medicare Other | Admitting: Family Medicine

## 2018-10-03 ENCOUNTER — Other Ambulatory Visit: Payer: Self-pay | Admitting: Family Medicine

## 2018-10-03 ENCOUNTER — Encounter: Payer: Self-pay | Admitting: Family Medicine

## 2018-10-03 VITALS — BP 104/64 | HR 86 | Temp 97.6°F | Resp 15 | Ht 64.0 in | Wt 156.0 lb

## 2018-10-03 DIAGNOSIS — R51 Headache: Secondary | ICD-10-CM | POA: Diagnosis not present

## 2018-10-03 DIAGNOSIS — M25552 Pain in left hip: Secondary | ICD-10-CM

## 2018-10-03 DIAGNOSIS — M5442 Lumbago with sciatica, left side: Secondary | ICD-10-CM | POA: Diagnosis not present

## 2018-10-03 DIAGNOSIS — R519 Headache, unspecified: Secondary | ICD-10-CM

## 2018-10-03 DIAGNOSIS — R7301 Impaired fasting glucose: Secondary | ICD-10-CM

## 2018-10-03 DIAGNOSIS — G8929 Other chronic pain: Secondary | ICD-10-CM

## 2018-10-03 DIAGNOSIS — Z0000 Encounter for general adult medical examination without abnormal findings: Secondary | ICD-10-CM

## 2018-10-03 DIAGNOSIS — I10 Essential (primary) hypertension: Secondary | ICD-10-CM

## 2018-10-03 DIAGNOSIS — E7849 Other hyperlipidemia: Secondary | ICD-10-CM

## 2018-10-03 DIAGNOSIS — G4459 Other complicated headache syndrome: Secondary | ICD-10-CM

## 2018-10-03 LAB — COMPLETE METABOLIC PANEL WITH GFR
AG Ratio: 1.5 (calc) (ref 1.0–2.5)
ALT: 15 U/L (ref 6–29)
AST: 20 U/L (ref 10–35)
Albumin: 4.4 g/dL (ref 3.6–5.1)
Alkaline phosphatase (APISO): 70 U/L (ref 37–153)
BUN: 19 mg/dL (ref 7–25)
CO2: 26 mmol/L (ref 20–32)
Calcium: 10 mg/dL (ref 8.6–10.4)
Chloride: 102 mmol/L (ref 98–110)
Creat: 0.79 mg/dL (ref 0.60–0.88)
GFR, Est African American: 82 mL/min/{1.73_m2} (ref >=60)
GFR, Est Non African American: 71 mL/min/{1.73_m2} (ref >=60)
Globulin: 3 g/dL (calc) (ref 1.9–3.7)
Glucose, Bld: 102 mg/dL — ABNORMAL HIGH (ref 65–99)
Potassium: 3.4 mmol/L — ABNORMAL LOW (ref 3.5–5.3)
Sodium: 140 mmol/L (ref 135–146)
Total Bilirubin: 0.7 mg/dL (ref 0.2–1.2)
Total Protein: 7.4 g/dL (ref 6.1–8.1)

## 2018-10-03 LAB — LIPID PANEL
Cholesterol: 229 mg/dL — ABNORMAL HIGH (ref <200)
HDL: 40 mg/dL — ABNORMAL LOW (ref >=50)
LDL Cholesterol (Calc): 148 mg/dL (calc) — ABNORMAL HIGH
Non-HDL Cholesterol (Calc): 189 mg/dL (calc) — ABNORMAL HIGH (ref <130)
Total CHOL/HDL Ratio: 5.7 (calc) — ABNORMAL HIGH (ref <5.0)
Triglycerides: 269 mg/dL — ABNORMAL HIGH (ref <150)

## 2018-10-03 LAB — HEMOGLOBIN A1C
Hgb A1c MFr Bld: 6.2 % of total Hgb — ABNORMAL HIGH (ref <5.7)
Mean Plasma Glucose: 131 (calc)
eAG (mmol/L): 7.3 (calc)

## 2018-10-03 MED ORDER — KETOROLAC TROMETHAMINE 60 MG/2ML IM SOLN
60.0000 mg | Freq: Once | INTRAMUSCULAR | Status: AC
  Administered 2018-10-03: 60 mg via INTRAMUSCULAR

## 2018-10-03 MED ORDER — EZETIMIBE 10 MG PO TABS: 10.0000 mg | ORAL_TABLET | Freq: Every day | ORAL | 3 refills | Status: DC

## 2018-10-03 MED ORDER — METHYLPREDNISOLONE ACETATE 80 MG/ML IJ SUSP
80.0000 mg | Freq: Once | INTRAMUSCULAR | Status: AC
  Administered 2018-10-03: 80 mg via INTRAMUSCULAR

## 2018-10-03 NOTE — Progress Notes (Signed)
    Andrea Santiago     MRN: [id number]      DOB: 1940-08-08  HPI: Patient is in for annual physical exam. Back pain radiating to left calf rated at an 8 for 2 months, no falls   sores on scalp, right and left, left side had pus draining After this drained had a migraine for 1 week , then for 2 days, concerned about new headache and with f/h of aneurysm wants imaging.    PE: BP 104/64   Pulse 86   Temp 97.6 F (36.4 C) (Temporal)   Resp 15   Ht 5\' 4"  (1.626 m)   Wt 156 lb (70.8 kg)   SpO2 97%   BMI 26.78 kg/m   Pleasant  female, alert and oriented x 3, in no cardio-pulmonary distress.Pt in pain Afebrile. HEENT No facial trauma or asymetry. Sinuses non tender.  Extra occullar muscles intact,External ears normal,  Neck: supple, no adenopathy,JVD or thyromegaly.No bruits.  Chest: Clear to ascultation bilaterally.No crackles or wheezes. Non tender to palpation  Breast: Not examined  Cardiovascular system; Heart sounds normal,  S1 and  S2 ,no S3.  No murmur, or thrill. Apical beat not displaced Peripheral pulses normal.  .   Musculoskeletal exam: Decreased ROM of lumbar spine, and left hip ,Adequiate in shoulders and knees. No deformity ,swelling or crepitus noted. No muscle wasting or atrophy.   Neurologic: Cranial nerves 2 to 12 intact. Power, tone ,sensation  normal throughout.  disturbance in gait. No tremor.  Skin: Intact, no ulceration, erythema , scaling or rash noted. Pigmentation normal throughout  Psych; Normal mood and affect. Judgement and concentration normal   Assessment & Plan:  Annual physical exam Annual exam as documented. Counseling done  re healthy lifestyle involving commitment to 150 minutes exercise per week, heart healthy diet, and attaining healthy weight.The importance of adequate sleep also discussed. Regular seat belt use and home safety, is also discussed. Changes in health habits are decided on by the patient with goals and  time frames  set for achieving them. Immunization and cancer screening needs are specifically addressed at this visit.   Low back pain with left-sided sciatica Uncontrolled.Toradol and depo medrol administered IM in the office .  Headache disorder Worsening symptoms , headache more persistent and severe in past month, f/h of aneurysm imaging of brain to be upddated

## 2018-10-03 NOTE — Patient Instructions (Signed)
Follow-up with MD in 6 months call if you need me sooner.   Arrange for flu vaccine in next 1 to 3 weeks  A brain scan will be ordered because of your complaint of increased headache severity and frequency and your family history of an aneurysm.  We will call you with the appointment  Toradol 60 mg IM and Depo-Medrol 80 mg IM today for back pain with left sciatica.  Blood sugar has improved kidney and liver function are normal however your cholesterol is very high.  Please reduce all nuts fried and fatty foods.  New additional medication is prescribed a zetia. Take 1 daily and continue Niaspan as before.  Repeat cmp and EGR, hBA1C and lipid panel in January  Thanks for choosing [facility], we consider it a privelige to serve you.

## 2018-10-04 ENCOUNTER — Other Ambulatory Visit: Payer: Self-pay | Admitting: Family Medicine

## 2018-10-05 ENCOUNTER — Encounter: Payer: Self-pay | Admitting: Family Medicine

## 2018-10-05 NOTE — Assessment & Plan Note (Signed)

## 2018-10-05 NOTE — Assessment & Plan Note (Signed)
Uncontrolled.Toradol and depo medrol administered IM in the office . 

## 2018-10-05 NOTE — Assessment & Plan Note (Signed)
Worsening symptoms , headache more persistent and severe in past month, f/h of aneurysm imaging of brain to be upddated

## 2018-10-11 ENCOUNTER — Ambulatory Visit: Payer: Medicare Other | Admitting: Rheumatology
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
## 2018-10-16 ENCOUNTER — Ambulatory Visit (HOSPITAL_COMMUNITY): Payer: Medicare Other | Attending: Family Medicine

## 2018-10-18 ENCOUNTER — Ambulatory Visit (HOSPITAL_COMMUNITY): Payer: Medicare Other

## 2018-10-25 ENCOUNTER — Ambulatory Visit: Payer: Medicare Other | Admitting: Rheumatology
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
## 2018-10-26 ENCOUNTER — Other Ambulatory Visit: Payer: Self-pay | Admitting: Family Medicine

## 2018-10-29 ENCOUNTER — Ambulatory Visit: Payer: Medicare Other | Admitting: Rheumatology

## 2018-10-31 ENCOUNTER — Other Ambulatory Visit: Payer: Self-pay | Admitting: Family Medicine

## 2018-10-31 ENCOUNTER — Ambulatory Visit (HOSPITAL_COMMUNITY): Payer: Medicare Other

## 2018-11-04 ENCOUNTER — Other Ambulatory Visit: Payer: Self-pay | Admitting: Rheumatology
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
## 2018-11-11 ENCOUNTER — Encounter (HOSPITAL_COMMUNITY): Payer: Self-pay

## 2018-11-11 ENCOUNTER — Ambulatory Visit (HOSPITAL_COMMUNITY)
Admission: RE | Admit: 2018-11-11 | Discharge: 2018-11-11 | Disposition: A | Discharge status: Home / Self Care | Payer: Medicare Other | Source: Ambulatory Visit | Attending: Family Medicine | Admitting: Family Medicine

## 2018-11-11 ENCOUNTER — Ambulatory Visit (INDEPENDENT_AMBULATORY_CARE_PROVIDER_SITE_OTHER): Payer: Medicare Other | Admitting: Cardiovascular Disease

## 2018-11-11 ENCOUNTER — Other Ambulatory Visit: Payer: Self-pay

## 2018-11-11 ENCOUNTER — Encounter: Payer: Self-pay | Admitting: Cardiovascular Disease

## 2018-11-11 VITALS — BP 117/52 | HR 66 | Temp 96.9°F | Ht 64.0 in | Wt 162.0 lb

## 2018-11-11 DIAGNOSIS — Z1231 Encounter for screening mammogram for malignant neoplasm of breast: Secondary | ICD-10-CM | POA: Insufficient documentation

## 2018-11-11 DIAGNOSIS — I350 Nonrheumatic aortic (valve) stenosis: Secondary | ICD-10-CM

## 2018-11-11 DIAGNOSIS — I10 Essential (primary) hypertension: Secondary | ICD-10-CM

## 2018-11-11 NOTE — Patient Instructions (Signed)
Medication Instructions: Your physician recommends that you continue on your current medications as directed. Please refer to the Current Medication list given to you today.   Labwork: None today  Procedures/Testing: Your physician has requested that you have an echocardiogram in January . Echocardiography is a painless test that uses sound waves to create images of your heart. It provides your doctor with information about the size and shape of your heart and how well your heart's chambers and valves are working. This procedure takes approximately one hour. There are no restrictions for this procedure.    Follow-Up: 6 months in office with Dr.Koneswaran  Any Additional Special Instructions Will Be Listed Below (If Applicable).     If you need a refill on your cardiac medications before your next appointment, please call your pharmacy.      Thank you for choosing [facility] !
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
## 2018-11-12 ENCOUNTER — Encounter: Payer: Self-pay | Admitting: Rheumatology

## 2018-11-12 ENCOUNTER — Ambulatory Visit (INDEPENDENT_AMBULATORY_CARE_PROVIDER_SITE_OTHER): Payer: Medicare Other | Admitting: Rheumatology

## 2018-11-12 VITALS — BP 150/63 | HR 74 | Resp 12 | Ht 64.0 in | Wt 162.8 lb

## 2018-11-12 DIAGNOSIS — M19042 Primary osteoarthritis, left hand: Secondary | ICD-10-CM

## 2018-11-12 DIAGNOSIS — M797 Fibromyalgia: Secondary | ICD-10-CM

## 2018-11-12 DIAGNOSIS — Z8679 Personal history of other diseases of the circulatory system: Secondary | ICD-10-CM

## 2018-11-12 DIAGNOSIS — R5383 Other fatigue: Secondary | ICD-10-CM | POA: Diagnosis not present

## 2018-11-12 DIAGNOSIS — Z8659 Personal history of other mental and behavioral disorders: Secondary | ICD-10-CM

## 2018-11-12 DIAGNOSIS — M51369 Other intervertebral disc degeneration, lumbar region without mention of lumbar back pain or lower extremity pain: Secondary | ICD-10-CM

## 2018-11-12 DIAGNOSIS — M170 Bilateral primary osteoarthritis of knee: Secondary | ICD-10-CM | POA: Diagnosis not present

## 2018-11-12 DIAGNOSIS — M7061 Trochanteric bursitis, right hip: Secondary | ICD-10-CM | POA: Diagnosis not present

## 2018-11-12 DIAGNOSIS — M19041 Primary osteoarthritis, right hand: Secondary | ICD-10-CM

## 2018-11-12 DIAGNOSIS — M5136 Other intervertebral disc degeneration, lumbar region: Secondary | ICD-10-CM

## 2018-11-12 DIAGNOSIS — M8589 Other specified disorders of bone density and structure, multiple sites: Secondary | ICD-10-CM

## 2018-11-12 DIAGNOSIS — M19071 Primary osteoarthritis, right ankle and foot: Secondary | ICD-10-CM

## 2018-11-12 DIAGNOSIS — M19072 Primary osteoarthritis, left ankle and foot: Secondary | ICD-10-CM

## 2018-11-12 DIAGNOSIS — Z8639 Personal history of other endocrine, nutritional and metabolic disease: Secondary | ICD-10-CM

## 2018-11-12 DIAGNOSIS — E559 Vitamin D deficiency, unspecified: Secondary | ICD-10-CM

## 2018-11-12 MED ORDER — LIDOCAINE HCL 1 % IJ SOLN
1.5000 mL | INTRAMUSCULAR | Status: AC | PRN
  Administered 2018-11-12: 1.5 mL

## 2018-11-12 MED ORDER — TRIAMCINOLONE ACETONIDE 40 MG/ML IJ SUSP
40.0000 mg | INTRAMUSCULAR | Status: AC | PRN
  Administered 2018-11-12: 40 mg via INTRA_ARTICULAR

## 2018-11-12 NOTE — Patient Instructions (Signed)
 Hip Bursitis Rehab Ask your health care provider which exercises are safe for you. Do exercises exactly as told by your health care provider and adjust them as directed. It is normal to feel mild stretching, pulling, tightness, or discomfort as you do these exercises. Stop right away if you feel sudden pain or your pain gets worse. Do not begin these exercises until told by your health care provider. Stretching exercise This exercise warms up your muscles and joints and improves the movement and flexibility of your hip. This exercise also helps to relieve pain and stiffness. Iliotibial band stretch An iliotibial band is a strong band of muscle tissue that runs from the outer side of your hip to the outer side of your thigh and knee. 1. Lie on your side with your left / right leg in the top position. 2. Bend your left / right knee and grab your ankle. Stretch out your bottom arm to help you balance. 3. Slowly bring your knee back so your thigh is behind your body. 4. Slowly lower your knee toward the floor until you feel a gentle stretch on the outside of your left / right thigh. If you do not feel a stretch and your knee will not fall farther, place the heel of your other foot on top of your knee and pull your knee down toward the floor with your foot. 5. Hold this position for __________ seconds. 6. Slowly return to the starting position. Repeat __________ times. Complete this exercise __________ times a day. Strengthening exercises These exercises build strength and endurance in your hip and pelvis. Endurance is the ability to use your muscles for a long time, even after they get tired. Bridge This exercise strengthens the muscles that move your thigh backward (hip extensors). 1. Lie on your back on a firm surface with your knees bent and your feet flat on the floor. 2. Tighten your buttocks muscles and lift your buttocks off the floor until your trunk is level with your thighs. ? Do not  arch your back. ? You should feel the muscles working in your buttocks and the back of your thighs. If you do not feel these muscles, slide your feet 1-2 inches (2.5-5 cm) farther away from your buttocks. ? If this exercise is too easy, try doing it with your arms crossed over your chest. 3. Hold this position for __________ seconds. 4. Slowly lower your hips to the starting position. 5. Let your muscles relax completely after each repetition. Repeat __________ times. Complete this exercise __________ times a day. Squats This exercise strengthens the muscles in front of your thigh and knee (quadriceps). 1. Stand in front of a table, with your feet and knees pointing straight ahead. You may rest your hands on the table for balance but not for support. 2. Slowly bend your knees and lower your hips like you are going to sit in a chair. ? Keep your weight over your heels, not over your toes. ? Keep your lower legs upright so they are parallel with the table legs. ? Do not let your hips go lower than your knees. ? Do not bend lower than told by your health care provider. ? If your hip pain increases, do not bend as low. 3. Hold the squat position for __________ seconds. 4. Slowly push with your legs to return to standing. Do not use your hands to pull yourself to standing. Repeat __________ times. Complete this exercise __________ times a day. Hip hike 1.   Stand sideways on a bottom step. Stand on your left / right leg with your other foot unsupported next to the step. You can hold on to the railing or wall for balance if needed. 2. Keep your knees straight and your torso square. Then lift your left / right hip up toward the ceiling. 3. Hold this position for __________ seconds. 4. Slowly let your left / right hip lower toward the floor, past the starting position. Your foot should get closer to the floor. Do not lean or bend your knees. Repeat __________ times. Complete this exercise __________  times a day. Single leg stand 1. Without shoes, stand near a railing or in a doorway. You may hold on to the railing or door frame as needed for balance. 2. Squeeze your left / right buttock muscles, then lift up your other foot. ? Do not let your left / right hip push out to the side. ? It is helpful to stand in front of a mirror for this exercise so you can watch your hip. 3. Hold this position for __________ seconds. Repeat __________ times. Complete this exercise __________ times a day. This information is not intended to replace advice given to you by your health care provider. Make sure you discuss any questions you have with your health care provider. Document Released: 03/16/2004 Document Revised: 06/03/2018 Document Reviewed: 06/03/2018 Elsevier Patient Education  2020 Elsevier Inc[employer]  

## 2018-11-26 ENCOUNTER — Telehealth: Payer: Self-pay | Admitting: Family Medicine

## 2018-11-26 NOTE — Telephone Encounter (Signed)
PT LVM that she had the flu shot at [facility]  Sept 6th

## 2018-11-27 NOTE — Telephone Encounter (Signed)
Chart updated

## 2018-12-17 ENCOUNTER — Ambulatory Visit: Payer: Medicare Other | Admitting: Gastroenterology

## 2018-12-20 ENCOUNTER — Other Ambulatory Visit: Payer: Self-pay | Admitting: Family Medicine

## 2018-12-20 DIAGNOSIS — K5900 Constipation, unspecified: Secondary | ICD-10-CM

## 2019-02-07 ENCOUNTER — Other Ambulatory Visit: Payer: Self-pay | Admitting: Family Medicine

## 2019-03-06 ENCOUNTER — Ambulatory Visit (HOSPITAL_COMMUNITY): Payer: Medicare HMO

## 2019-03-07 ENCOUNTER — Ambulatory Visit (HOSPITAL_COMMUNITY): Payer: Medicare HMO | Attending: Cardiovascular Disease

## 2019-03-11 ENCOUNTER — Telehealth: Payer: Self-pay

## 2019-03-11 NOTE — Telephone Encounter (Signed)
Annual echo scheduled for 03/17/2019

## 2019-03-17 ENCOUNTER — Other Ambulatory Visit: Payer: Self-pay

## 2019-03-17 ENCOUNTER — Ambulatory Visit (HOSPITAL_COMMUNITY)
Admission: RE | Admit: 2019-03-17 | Discharge: 2019-03-17 | Disposition: A | Discharge status: Home / Self Care | Payer: Medicare HMO | Source: Ambulatory Visit | Attending: Cardiovascular Disease | Admitting: Cardiovascular Disease

## 2019-03-17 DIAGNOSIS — I350 Nonrheumatic aortic (valve) stenosis: Secondary | ICD-10-CM | POA: Diagnosis not present

## 2019-03-17 NOTE — Progress Notes (Signed)
*  PRELIMINARY RESULTS* Echocardiogram 2D Echocardiogram has been performed.  Andrea Santiago 03/17/2019, 12:30 PM

## 2019-03-18 ENCOUNTER — Telehealth: Payer: Self-pay

## 2019-03-18 DIAGNOSIS — I350 Nonrheumatic aortic (valve) stenosis: Secondary | ICD-10-CM

## 2019-03-18 NOTE — Telephone Encounter (Signed)
-----   Message from Herminio Commons, MD sent at 03/18/2019  8:51 AM EST ----- Pumping function is normal.  There is mild to moderate aortic valve leakage and moderate to severe aortic valve narrowing.  If she remains asymptomatic, I plan to repeat echocardiogram in 6 to 12 months.

## 2019-03-18 NOTE — Telephone Encounter (Signed)
Returned call to [employer]. Sorry she missed you call [phone] results. Please call [phone] Thanks renee

## 2019-03-18 NOTE — Telephone Encounter (Signed)
Patient given echo results, placed order for rpeat echo in 6 mons

## 2019-03-18 NOTE — Telephone Encounter (Signed)
Results of echo given to patient.

## 2019-04-02 ENCOUNTER — Other Ambulatory Visit: Payer: Self-pay | Admitting: Family Medicine

## 2019-04-03 ENCOUNTER — Ambulatory Visit: Payer: Medicare Other | Admitting: Family Medicine

## 2019-04-08 DIAGNOSIS — E7849 Other hyperlipidemia: Secondary | ICD-10-CM | POA: Diagnosis not present

## 2019-04-08 DIAGNOSIS — R7301 Impaired fasting glucose: Secondary | ICD-10-CM | POA: Diagnosis not present

## 2019-04-08 DIAGNOSIS — I10 Essential (primary) hypertension: Secondary | ICD-10-CM | POA: Diagnosis not present

## 2019-04-09 LAB — COMPLETE METABOLIC PANEL WITH GFR
AG Ratio: 1.4 (calc) (ref 1.0–2.5)
ALT: 17 U/L (ref 6–29)
AST: 21 U/L (ref 10–35)
Albumin: 4.3 g/dL (ref 3.6–5.1)
Alkaline phosphatase (APISO): 67 U/L (ref 37–153)
BUN: 14 mg/dL (ref 7–25)
CO2: 25 mmol/L (ref 20–32)
Calcium: 9.4 mg/dL (ref 8.6–10.4)
Chloride: 105 mmol/L (ref 98–110)
Creat: 0.77 mg/dL (ref 0.60–0.88)
GFR, Est African American: 85 mL/min/{1.73_m2} (ref >=60)
GFR, Est Non African American: 73 mL/min/{1.73_m2} (ref >=60)
Globulin: 3 g/dL (calc) (ref 1.9–3.7)
Glucose, Bld: 111 mg/dL — ABNORMAL HIGH (ref 65–99)
Potassium: 4 mmol/L (ref 3.5–5.3)
Sodium: 141 mmol/L (ref 135–146)
Total Bilirubin: 0.4 mg/dL (ref 0.2–1.2)
Total Protein: 7.3 g/dL (ref 6.1–8.1)

## 2019-04-09 LAB — LIPID PANEL
Cholesterol: 196 mg/dL (ref <200)
HDL: 49 mg/dL — ABNORMAL LOW (ref >=50)
LDL Cholesterol (Calc): 116 mg/dL (calc) — ABNORMAL HIGH
Non-HDL Cholesterol (Calc): 147 mg/dL (calc) — ABNORMAL HIGH (ref <130)
Total CHOL/HDL Ratio: 4 (calc) (ref <5.0)
Triglycerides: 192 mg/dL — ABNORMAL HIGH (ref <150)

## 2019-04-09 LAB — HEMOGLOBIN A1C
Hgb A1c MFr Bld: 6.1 % of total Hgb — ABNORMAL HIGH (ref <5.7)
Mean Plasma Glucose: 128 (calc)
eAG (mmol/L): 7.1 (calc)

## 2019-04-15 ENCOUNTER — Other Ambulatory Visit: Payer: Self-pay | Admitting: *Deleted

## 2019-04-15 ENCOUNTER — Encounter: Payer: Self-pay | Admitting: Family Medicine

## 2019-04-15 ENCOUNTER — Ambulatory Visit (INDEPENDENT_AMBULATORY_CARE_PROVIDER_SITE_OTHER): Payer: Medicare HMO | Admitting: Family Medicine

## 2019-04-15 ENCOUNTER — Other Ambulatory Visit: Payer: Self-pay

## 2019-04-15 VITALS — BP 140/60 | HR 82 | Temp 97.0°F | Resp 15 | Ht 64.0 in | Wt 162.0 lb

## 2019-04-15 DIAGNOSIS — G43709 Chronic migraine without aura, not intractable, without status migrainosus: Secondary | ICD-10-CM | POA: Diagnosis not present

## 2019-04-15 DIAGNOSIS — G43809 Other migraine, not intractable, without status migrainosus: Secondary | ICD-10-CM

## 2019-04-15 DIAGNOSIS — E7849 Other hyperlipidemia: Secondary | ICD-10-CM | POA: Diagnosis not present

## 2019-04-15 DIAGNOSIS — I10 Essential (primary) hypertension: Secondary | ICD-10-CM | POA: Diagnosis not present

## 2019-04-15 DIAGNOSIS — R519 Headache, unspecified: Secondary | ICD-10-CM

## 2019-04-15 DIAGNOSIS — K219 Gastro-esophageal reflux disease without esophagitis: Secondary | ICD-10-CM

## 2019-04-15 DIAGNOSIS — F322 Major depressive disorder, single episode, severe without psychotic features: Secondary | ICD-10-CM | POA: Diagnosis not present

## 2019-04-15 DIAGNOSIS — E663 Overweight: Secondary | ICD-10-CM

## 2019-04-15 DIAGNOSIS — R011 Cardiac murmur, unspecified: Secondary | ICD-10-CM

## 2019-04-15 DIAGNOSIS — R7303 Prediabetes: Secondary | ICD-10-CM

## 2019-04-15 DIAGNOSIS — E039 Hypothyroidism, unspecified: Secondary | ICD-10-CM

## 2019-04-15 DIAGNOSIS — J449 Chronic obstructive pulmonary disease, unspecified: Secondary | ICD-10-CM | POA: Diagnosis not present

## 2019-04-15 DIAGNOSIS — K5900 Constipation, unspecified: Secondary | ICD-10-CM

## 2019-04-15 DIAGNOSIS — G8929 Other chronic pain: Secondary | ICD-10-CM

## 2019-04-15 MED ORDER — EZETIMIBE 10 MG PO TABS: 10.0000 mg | ORAL_TABLET | Freq: Every day | ORAL | 3 refills | Status: DC

## 2019-04-15 MED ORDER — COQ-10 100 MG PO CAPS: 100.0000 mg | ORAL_CAPSULE | Freq: Three times a day (TID) | ORAL | 1 refills | Status: DC

## 2019-04-15 NOTE — Patient Instructions (Addendum)
May you have a year filled with hope, love, happiness and laughter.  I appreciate the opportunity to provide you with care for your health and wellness. Today we discussed: overall health   Follow up: 10/02/2019  No labs or referrals today  Co Q10 100 or 300 mg   We will work on CT Scan order.   Keep eating well and avoiding fatty, fried, and sugary foods.  Keep working in yard and being active :)  Please continue to practice social distancing to keep you, your family, and our community safe.  If you must go out, please wear a mask and practice good handwashing.  It was a pleasure to see you and I look forward to continuing to work together on your health and well-being. Please do not hesitate to call the office if you need care or have questions about your care.  Have a wonderful day and week. With Gratitude, Cherly Beach, DNP, AGNP-BC

## 2019-04-15 NOTE — Progress Notes (Signed)
Subjective:  Patient ID: Andrea Santiago, female    DOB: 03-09-1940  Age: 79 y.o. MRN: [id number]  CC:  Chief Complaint  Patient presents with  . Hypertension    follow up  . Headache    having migraine headaches. was scheduled for MRI brain but never had the scan do. also wants medication for migraines      HPI  HPI  Andrea Santiago presents today for discussion of labs, follow-up on chronic conditions and also presents that she still having chronic headaches intermittently.  Never had her MRI/CT scan that Dr. Moshe Cipro ordered last year be as on the day that she had it she was having a bad headache and could not come in.  She reports that they did not allow her to reschedule it.  She denies having any other issues or concerns today as she reports doing well taking her medications well denies having any chest pain, vision changes, dizziness..  Denies having any palpitations, leg swelling, changes in bowel or bladder habits.  Denies having any skin issues.  Reports sleeping well and eating well.  Today patient denies signs and symptoms of COVID 19 infection including fever, chills, cough, shortness of breath, and headache. Past Medical, Surgical, Social History, Allergies, and Medications have been Reviewed.   Past Medical History:  Diagnosis Date  . ALLERGIC RHINITIS   . Annual physical exam 03/26/2015  . Arthritis   . Bronchitis, acute   . Complication of anesthesia   . Constipation    NOS  . COPD (chronic obstructive pulmonary disease) (HCC)    bronchitis- chronic, followed by Dr. Susann Givens   . Depression   . Fibromyalgia   . GERD (gastroesophageal reflux disease)    no longer using omprazole, ginger is her remedy for indigestion   . HOH (hard of hearing)   . Hyperlipemia   . Hypertension   . Hypothyroidism   . Left shoulder pain 05/06/2009   Qualifier: Diagnosis of  By: Claybon Jabs PA, Dawn    . Meniere's disease   . Osteoporosis   . Other fatigue 02/05/2008   Qualifier:  Diagnosis of  By: Cori Razor LPN, Brandi    . PONV (postoperative nausea and vomiting)   . Varicose veins     Current Meds  Medication Sig  . budesonide-formoterol (SYMBICORT) 160-4.5 MCG/ACT inhaler Inhale 2 puffs into the lungs 2 (two) times daily.   . butalbital-acetaminophen-caffeine (FIORICET) 50-325-40 MG tablet Take 1-2 tablets by mouth every 6 (six) hours as needed for headache.  . diclofenac sodium (VOLTAREN) 1 % GEL Apply 2-4 grams to affected joint up to 4 times daily  . DULoxetine (CYMBALTA) 60 MG capsule TAKE 1 CAPSULE (60 MG TOTAL) BY MOUTH 2 (TWO) TIMES DAILY.  Marland Kitchen ezetimibe (ZETIA) 10 MG tablet Take 1 tablet (10 mg total) by mouth daily.  Marland Kitchen FLUoxetine (PROZAC) 40 MG capsule TAKE 1 CAPSULE BY MOUTH EVERY DAY  . hydrochlorothiazide (HYDRODIURIL) 25 MG tablet TAKE 1 TABLET BY MOUTH EVERY DAY  . KLOR-CON M10 10 MEQ tablet TAKE 3 TABLETS (30 MEQ TOTAL) BY MOUTH DAILY.  Marland Kitchen levothyroxine (SYNTHROID, LEVOTHROID) 88 MCG tablet Take 1 tablet (88 mcg total) by mouth daily before breakfast.  . lubiprostone (AMITIZA) 24 MCG capsule TAKE 1 CAPSULE BY MOUTH 2 TIMES DAILY WITH A MEAL.  Marland Kitchen meclizine (ANTIVERT) 25 MG tablet Take 1 tablet (25 mg total) by mouth 3 (three) times daily as needed for dizziness.  . meloxicam (MOBIC) 15 MG tablet Take  1 tablet (15 mg total) by mouth daily.  . niacin (NIASPAN) 1000 MG CR tablet TAKE 2 TABLETS (2,000 MG TOTAL) BY MOUTH AT BEDTIME.  Marland Kitchen ondansetron (ZOFRAN) 4 MG tablet Take 1 tablet (4 mg total) by mouth every 8 (eight) hours as needed for nausea or vomiting.  Marland Kitchen PROAIR HFA 108 (90 BASE) MCG/ACT inhaler INHALE 2 PUFFS INTO THE LUNGS EVERY 4 HOURS AS NEEDED FOR WHEEZING  . tiZANidine (ZANAFLEX) 4 MG tablet TAKE 1 TABLET BY MOUTH 3 TIMES A DAY  . TURMERIC PO Take by mouth daily.    ROS:  Review of Systems  HENT: Negative.   Eyes: Negative.   Respiratory: Negative.   Cardiovascular: Negative.   Gastrointestinal: Negative.   Genitourinary: Negative.    Musculoskeletal: Negative.   Skin: Negative.   Neurological: Positive for headaches.  Endo/Heme/Allergies: Negative.   Psychiatric/Behavioral: Negative.   All other systems reviewed and are negative.    Objective:   Today's Vitals: BP 140/60   Pulse 82   Temp (!) 97 F (36.1 C) (Temporal)   Resp 15   Ht 5\' 4"  (1.626 m)   Wt 162 lb (73.5 kg)   SpO2 96%   BMI 27.81 kg/m  Vitals with BMI 04/15/2019 11/12/2018 11/11/2018  Height 5\' 4"  5\' 4"  5\' 4"   Weight 162 lbs 162 lbs 13 oz 162 lbs  BMI 27.79 123456 [id number]  Systolic [id number]  Diastolic 60 63 52  Pulse 82 74 66     Physical Exam Vitals and nursing note reviewed.  Constitutional:      Appearance: Normal appearance. She is well-developed and well-groomed.  HENT:     Head: Normocephalic and atraumatic.     Right Ear: External ear normal.     Left Ear: External ear normal.     Mouth/Throat:     Comments: Mask in place  Eyes:     General:        Right eye: No discharge.        Left eye: No discharge.     Conjunctiva/sclera: Conjunctivae normal.  Cardiovascular:     Rate and Rhythm: Normal rate and regular rhythm.     Pulses: Normal pulses.     Heart sounds: Murmur present. Systolic murmur present with a grade of 2/6.     Comments: Murmur loudest over right sternal border. Pulmonary:     Effort: Pulmonary effort is normal.     Breath sounds: Normal breath sounds.  Musculoskeletal:        General: Normal range of motion.     Cervical back: Normal range of motion and neck supple.  Skin:    General: Skin is warm.  Neurological:     General: No focal deficit present.     Mental Status: She is alert and oriented to person, place, and time.  Psychiatric:        Attention and Perception: Attention normal.        Mood and Affect: Mood normal.        Speech: Speech normal.        Behavior: Behavior normal. Behavior is cooperative.        Thought Content: Thought content normal.        Cognition and Memory: Cognition  normal.        Judgment: Judgment normal.    Depression screen [facility] 2/9 04/15/2019 10/03/2018 10/03/2018  Decreased Interest 0 0 0  Down, Depressed, Hopeless 0 0 0  PHQ - 2 Score 0  0 0  Altered sleeping - 0 -  Tired, decreased energy - 0 -  Change in appetite - 0 -  Feeling bad or failure about yourself  - 0 -  Trouble concentrating - 0 -  Moving slowly or fidgety/restless - 0 -  Suicidal thoughts - 0 -  PHQ-9 Score - 0 -  Difficult doing work/chores - Not difficult at all -  Some recent data might be hidden     Assessment   1. Depression, major, single episode, severe ([place])   2. Essential hypertension   3. Hypothyroidism, unspecified type   4. Chronic migraine without aura without status migrainosus, not intractable   5. Gastroesophageal reflux disease, unspecified whether esophagitis present   6. Other hyperlipidemia   7. Overweight   8. Prediabetes   9. Constipation, unspecified constipation type   10. Heart murmur, systolic     Tests ordered No orders of the defined types were placed in this encounter.    Plan: Please see assessment and plan per problem list above.   Meds ordered this encounter  Medications  . Coenzyme Q10 (COQ-10) 100 MG CAPS    Sig: Take 100 mg by mouth in the morning, at noon, and at bedtime.    Dispense:  90 capsule    Refill:  1    Order Specific Question:   Supervising Provider    Answer:   Fayrene Helper [id number]    Patient to follow-up in 6 months in office .  Perlie Mayo, NP

## 2019-04-20 ENCOUNTER — Encounter: Payer: Self-pay | Admitting: Family Medicine

## 2019-04-20 DIAGNOSIS — R011 Cardiac murmur, unspecified: Secondary | ICD-10-CM | POA: Insufficient documentation

## 2019-04-20 DIAGNOSIS — G43709 Chronic migraine without aura, not intractable, without status migrainosus: Secondary | ICD-10-CM | POA: Insufficient documentation

## 2019-04-20 NOTE — Assessment & Plan Note (Signed)
Unchanged,  Andrea Santiago is re-educated about the importance of exercise daily to help with weight management. A minumum of 30 minutes daily is recommended. Additionally, importance of healthy food choices  with portion control discussed.   Wt Readings from Last 3 Encounters:  04/15/19 162 lb (73.5 kg)  11/12/18 162 lb 12.8 oz (73.8 kg)  11/11/18 162 lb (73.5 kg)

## 2019-04-20 NOTE — Assessment & Plan Note (Signed)
Recent A1c 6.1%.  Controlled stable encouraged to maintain a heart healthy diet.

## 2019-04-20 NOTE — Assessment & Plan Note (Signed)
Reports doing well with this.

## 2019-04-20 NOTE — Assessment & Plan Note (Signed)
Starting co-Q10 for treatment of ongoing migraine.  Advised for her to follow-up with [place] if this does not help.

## 2019-04-20 NOTE — Assessment & Plan Note (Signed)
Appears to have some intermitted control. Recheck today was 140/60.  Continue current medications at this time.  Included education of DASH diet and daily physical commitment to minimum of 30 minutes of exercise daily.

## 2019-04-20 NOTE — Assessment & Plan Note (Signed)
2/6 grade systolic Heart murmur. Followed by cards

## 2019-04-20 NOTE — Assessment & Plan Note (Signed)
Does not tolerate statin therapy.  Is taking Zetia and niacin as directed.  Strong recommendations to get tighter control of low-fat needs in diet.

## 2019-04-20 NOTE — Assessment & Plan Note (Signed)
Controlled no medication change.

## 2019-04-20 NOTE — Assessment & Plan Note (Signed)
Does see Dr. Dorris Fetch.  Is on Synthroid denies having any signs or symptoms of hypothyroidism today.  Continue current medication and follow-up with Dr. Dorris Fetch.

## 2019-04-20 NOTE — Assessment & Plan Note (Signed)
PHq0 no si or hi. Continue cymbalta

## 2019-04-21 ENCOUNTER — Telehealth: Payer: Self-pay

## 2019-04-21 ENCOUNTER — Other Ambulatory Visit: Payer: Self-pay | Admitting: *Deleted

## 2019-04-21 MED ORDER — NIACIN ER (ANTIHYPERLIPIDEMIC) 1000 MG PO TBCR: 2000.0000 mg | EXTENDED_RELEASE_TABLET | Freq: Every day | ORAL | 1 refills | Status: DC

## 2019-04-21 NOTE — Telephone Encounter (Signed)
Per the AVS on 04-15-19, it states  We will work on CT Scan order.    I cant locate anything, please advise

## 2019-04-22 NOTE — Telephone Encounter (Signed)
Reordered scan.

## 2019-04-22 NOTE — Addendum Note (Signed)
Addended by: Eual Fines on: 04/22/2019 09:55 AM   Modules accepted: Orders

## 2019-04-23 ENCOUNTER — Other Ambulatory Visit: Payer: Self-pay | Admitting: *Deleted

## 2019-04-23 MED ORDER — LEVOTHYROXINE SODIUM 88 MCG PO TABS: 88.0000 ug | ORAL_TABLET | Freq: Every day | ORAL | 3 refills | Status: DC

## 2019-04-23 MED ORDER — HYDROCHLOROTHIAZIDE 25 MG PO TABS: 25.0000 mg | ORAL_TABLET | Freq: Every day | ORAL | 0 refills | Status: DC

## 2019-04-24 ENCOUNTER — Telehealth: Payer: Self-pay

## 2019-04-24 NOTE — Telephone Encounter (Signed)
[employer] LVM that you will need to resubmit for pt

## 2019-04-24 NOTE — Telephone Encounter (Signed)
Patients MRI is approved.
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
## 2019-05-07 ENCOUNTER — Other Ambulatory Visit: Payer: Self-pay

## 2019-05-07 ENCOUNTER — Telehealth: Payer: Self-pay | Admitting: "Endocrinology

## 2019-05-07 DIAGNOSIS — E039 Hypothyroidism, unspecified: Secondary | ICD-10-CM

## 2019-05-07 NOTE — Telephone Encounter (Signed)
Lab orders updated and sent to [facility]. 

## 2019-05-07 NOTE — Telephone Encounter (Signed)
Can you update her lab order? °

## 2019-05-08 ENCOUNTER — Ambulatory Visit: Payer: Medicare Other | Admitting: "Endocrinology

## 2019-05-09 DIAGNOSIS — E039 Hypothyroidism, unspecified: Secondary | ICD-10-CM | POA: Diagnosis not present

## 2019-05-09 LAB — TSH: TSH: 0.85 mIU/L (ref 0.40–4.50)

## 2019-05-09 LAB — T4, FREE: Free T4: 1.2 ng/dL (ref 0.8–1.8)

## 2019-05-13 ENCOUNTER — Ambulatory Visit: Payer: Medicare Other | Admitting: Rheumatology

## 2019-05-14 ENCOUNTER — Ambulatory Visit: Payer: Medicare HMO | Admitting: "Endocrinology

## 2019-05-21 ENCOUNTER — Ambulatory Visit (HOSPITAL_COMMUNITY)
Admission: RE | Admit: 2019-05-21 | Discharge: 2019-05-21 | Disposition: A | Discharge status: Home / Self Care | Payer: Medicare HMO | Source: Ambulatory Visit | Attending: Family Medicine | Admitting: Family Medicine

## 2019-05-21 ENCOUNTER — Other Ambulatory Visit: Payer: Self-pay

## 2019-05-21 DIAGNOSIS — G43809 Other migraine, not intractable, without status migrainosus: Secondary | ICD-10-CM | POA: Insufficient documentation

## 2019-05-21 DIAGNOSIS — G43909 Migraine, unspecified, not intractable, without status migrainosus: Secondary | ICD-10-CM | POA: Diagnosis not present
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
## 2019-05-30 ENCOUNTER — Ambulatory Visit: Payer: Medicare HMO | Admitting: Rheumatology
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
## 2019-06-12 ENCOUNTER — Other Ambulatory Visit: Payer: Self-pay

## 2019-06-12 ENCOUNTER — Telehealth: Payer: Self-pay | Admitting: Rheumatology

## 2019-06-12 ENCOUNTER — Ambulatory Visit (INDEPENDENT_AMBULATORY_CARE_PROVIDER_SITE_OTHER): Payer: Medicare HMO | Admitting: Physician Assistant

## 2019-06-12 ENCOUNTER — Encounter: Payer: Self-pay | Admitting: Physician Assistant

## 2019-06-12 ENCOUNTER — Telehealth: Payer: Self-pay

## 2019-06-12 VITALS — BP 137/69 | HR 85 | Resp 18 | Ht 64.0 in | Wt 166.6 lb

## 2019-06-12 DIAGNOSIS — M7061 Trochanteric bursitis, right hip: Secondary | ICD-10-CM | POA: Diagnosis not present

## 2019-06-12 DIAGNOSIS — M170 Bilateral primary osteoarthritis of knee: Secondary | ICD-10-CM | POA: Diagnosis not present

## 2019-06-12 DIAGNOSIS — Z8679 Personal history of other diseases of the circulatory system: Secondary | ICD-10-CM

## 2019-06-12 DIAGNOSIS — R5383 Other fatigue: Secondary | ICD-10-CM

## 2019-06-12 DIAGNOSIS — M19041 Primary osteoarthritis, right hand: Secondary | ICD-10-CM | POA: Diagnosis not present

## 2019-06-12 DIAGNOSIS — M7062 Trochanteric bursitis, left hip: Secondary | ICD-10-CM

## 2019-06-12 DIAGNOSIS — M797 Fibromyalgia: Secondary | ICD-10-CM

## 2019-06-12 DIAGNOSIS — Z8659 Personal history of other mental and behavioral disorders: Secondary | ICD-10-CM

## 2019-06-12 DIAGNOSIS — M19072 Primary osteoarthritis, left ankle and foot: Secondary | ICD-10-CM

## 2019-06-12 DIAGNOSIS — M8589 Other specified disorders of bone density and structure, multiple sites: Secondary | ICD-10-CM

## 2019-06-12 DIAGNOSIS — M19071 Primary osteoarthritis, right ankle and foot: Secondary | ICD-10-CM

## 2019-06-12 DIAGNOSIS — M5136 Other intervertebral disc degeneration, lumbar region: Secondary | ICD-10-CM

## 2019-06-12 DIAGNOSIS — E559 Vitamin D deficiency, unspecified: Secondary | ICD-10-CM | POA: Diagnosis not present

## 2019-06-12 DIAGNOSIS — M19042 Primary osteoarthritis, left hand: Secondary | ICD-10-CM

## 2019-06-12 DIAGNOSIS — Z8639 Personal history of other endocrine, nutritional and metabolic disease: Secondary | ICD-10-CM

## 2019-06-12 MED ORDER — DICLOFENAC SODIUM 1 % EX GEL: CUTANEOUS | 2 refills | Status: DC

## 2019-06-12 NOTE — Telephone Encounter (Signed)
I am calling for your patient to let you know that her son committed suicide earlier today and needs some kind of medication to help calm her nerves.  She tried calling her PCP and couldn't reach her and was hoping that Dr. Estanislado Pandy would be able to help.  My name is Collie Siad and [phone]  Collie Siad called back stating that [facility] was able to reach her PCP and they will be taking care of her medication request.

## 2019-06-12 NOTE — Patient Instructions (Signed)
Magnesium malate 250 mg at bedtime for muscle cramps.   Potential side effects: diarrhea and drowsiness If you tolerate the 250 mg dose you can increase to 500 mg at bedtime   Hand Exercises Hand exercises can be helpful for almost anyone. These exercises can strengthen the hands, improve flexibility and movement, and increase blood flow to the hands. These results can make work and daily tasks easier. Hand exercises can be especially helpful for people who have joint pain from arthritis or have nerve damage from overuse (carpal tunnel syndrome). These exercises can also help people who have injured a hand. Exercises Most of these hand exercises are gentle stretching and motion exercises. It is usually safe to do them often throughout the day. Warming up your hands before exercise may help to reduce stiffness. You can do this with gentle massage or by placing your hands in warm water for 10-15 minutes. It is normal to feel some stretching, pulling, tightness, or mild discomfort as you begin new exercises. This will gradually improve. Stop an exercise right away if you feel sudden, severe pain or your pain gets worse. Ask your health care provider which exercises are best for you. Knuckle bend or "claw" fist 1. Stand or sit with your arm, hand, and all five fingers pointed straight up. Make sure to keep your wrist straight during the exercise. 2. Gently bend your fingers down toward your palm until the tips of your fingers are touching the top of your palm. Keep your big knuckle straight and just bend the small knuckles in your fingers. 3. Hold this position for __________ seconds. 4. Straighten (extend) your fingers back to the starting position. Repeat this exercise 5-10 times with each hand. Full finger fist 1. Stand or sit with your arm, hand, and all five fingers pointed straight up. Make sure to keep your wrist straight during the exercise. 2. Gently bend your fingers into your palm until the  tips of your fingers are touching the middle of your palm. 3. Hold this position for __________ seconds. 4. Extend your fingers back to the starting position, stretching every joint fully. Repeat this exercise 5-10 times with each hand. Straight fist 1. Stand or sit with your arm, hand, and all five fingers pointed straight up. Make sure to keep your wrist straight during the exercise. 2. Gently bend your fingers at the big knuckle, where your fingers meet your hand, and the middle knuckle. Keep the knuckle at the tips of your fingers straight and try to touch the bottom of your palm. 3. Hold this position for __________ seconds. 4. Extend your fingers back to the starting position, stretching every joint fully. Repeat this exercise 5-10 times with each hand. Tabletop 1. Stand or sit with your arm, hand, and all five fingers pointed straight up. Make sure to keep your wrist straight during the exercise. 2. Gently bend your fingers at the big knuckle, where your fingers meet your hand, as far down as you can while keeping the small knuckles in your fingers straight. Think of forming a tabletop with your fingers. 3. Hold this position for __________ seconds. 4. Extend your fingers back to the starting position, stretching every joint fully. Repeat this exercise 5-10 times with each hand. Finger spread 1. Place your hand flat on a table with your palm facing down. Make sure your wrist stays straight as you do this exercise. 2. Spread your fingers and thumb apart from each other as far as you can until  you feel a gentle stretch. Hold this position for __________ seconds. 3. Bring your fingers and thumb tight together again. Hold this position for __________ seconds. Repeat this exercise 5-10 times with each hand. Making circles 1. Stand or sit with your arm, hand, and all five fingers pointed straight up. Make sure to keep your wrist straight during the exercise. 2. Make a circle by touching the  tip of your thumb to the tip of your index finger. 3. Hold for __________ seconds. Then open your hand wide. 4. Repeat this motion with your thumb and each finger on your hand. Repeat this exercise 5-10 times with each hand. Thumb motion 1. Sit with your forearm resting on a table and your wrist straight. Your thumb should be facing up toward the ceiling. Keep your fingers relaxed as you move your thumb. 2. Lift your thumb up as high as you can toward the ceiling. Hold for __________ seconds. 3. Bend your thumb across your palm as far as you can, reaching the tip of your thumb for the small finger (pinkie) side of your palm. Hold for __________ seconds. Repeat this exercise 5-10 times with each hand. Grip strengthening  1. Hold a stress ball or other soft ball in the middle of your hand. 2. Slowly increase the pressure, squeezing the ball as much as you can without causing pain. Think of bringing the tips of your fingers into the middle of your palm. All of your finger joints should bend when doing this exercise. 3. Hold your squeeze for __________ seconds, then relax. Repeat this exercise 5-10 times with each hand. Contact a health care provider if:  Your hand pain or discomfort gets much worse when you do an exercise.  Your hand pain or discomfort does not improve within 2 hours after you exercise. If you have any of these problems, stop doing these exercises right away. Do not do them again unless your health care provider says that you can. Get help right away if:  You develop sudden, severe hand pain or swelling. If this happens, stop doing these exercises right away. Do not do them again unless your health care provider says that you can. This information is not intended to replace advice given to you by your health care provider. Make sure you discuss any questions you have with your health care provider. Document Revised: 05/30/2018 Document Reviewed: 02/07/2018 Elsevier Patient  Education  [place].

## 2019-06-12 NOTE — Telephone Encounter (Signed)
Dr. Estanislado Pandy is aware patient was able to contact her PCP for medication.

## 2019-06-12 NOTE — Telephone Encounter (Signed)
Pt just found out that her son Andrea Santiago hisself, and is distraugt, can something be called in for her  I advised her to check the drugstore every hour   Also expressed our deepest sympathy

## 2019-06-12 NOTE — Addendum Note (Signed)
Addended by: Earnestine Mealing on: 06/12/2019 02:28 PM   Modules accepted: Orders

## 2019-06-13 ENCOUNTER — Other Ambulatory Visit: Payer: Self-pay | Admitting: Family Medicine

## 2019-06-13 DIAGNOSIS — Z634 Disappearance and death of family member: Secondary | ICD-10-CM

## 2019-06-13 MED ORDER — LORAZEPAM 0.5 MG PO TABS: 0.5000 mg | ORAL_TABLET | Freq: Every day | ORAL | 0 refills | Status: DC

## 2019-06-13 NOTE — Telephone Encounter (Signed)
Pt was seen in February please address

## 2019-06-13 NOTE — Telephone Encounter (Signed)
I have sent in a one time script for Ativan. This is not to be continued. Close follow up in June if able.

## 2019-06-25 ENCOUNTER — Encounter: Payer: Self-pay | Admitting: Cardiovascular Disease

## 2019-06-25 ENCOUNTER — Other Ambulatory Visit: Payer: Self-pay

## 2019-06-25 ENCOUNTER — Other Ambulatory Visit: Payer: Self-pay | Admitting: *Deleted

## 2019-06-25 ENCOUNTER — Ambulatory Visit (INDEPENDENT_AMBULATORY_CARE_PROVIDER_SITE_OTHER): Payer: Medicare HMO | Admitting: Cardiovascular Disease

## 2019-06-25 VITALS — BP 142/68 | HR 94 | Ht 64.0 in | Wt 161.2 lb

## 2019-06-25 DIAGNOSIS — I351 Nonrheumatic aortic (valve) insufficiency: Secondary | ICD-10-CM

## 2019-06-25 DIAGNOSIS — K5900 Constipation, unspecified: Secondary | ICD-10-CM

## 2019-06-25 DIAGNOSIS — I350 Nonrheumatic aortic (valve) stenosis: Secondary | ICD-10-CM

## 2019-06-25 DIAGNOSIS — I10 Essential (primary) hypertension: Secondary | ICD-10-CM | POA: Diagnosis not present

## 2019-06-25 MED ORDER — LUBIPROSTONE 24 MCG PO CAPS: ORAL_CAPSULE | ORAL | 1 refills | Status: DC

## 2019-06-25 MED ORDER — HYDROCHLOROTHIAZIDE 25 MG PO TABS: 25.0000 mg | ORAL_TABLET | Freq: Every day | ORAL | 0 refills | Status: DC
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
## 2019-06-25 NOTE — Patient Instructions (Addendum)

## 2019-06-29 ENCOUNTER — Other Ambulatory Visit: Payer: Self-pay | Admitting: Family Medicine

## 2019-06-30 ENCOUNTER — Other Ambulatory Visit: Payer: Self-pay | Admitting: *Deleted

## 2019-06-30 MED ORDER — DULOXETINE HCL 60 MG PO CPEP: 60.0000 mg | ORAL_CAPSULE | Freq: Two times a day (BID) | ORAL | 0 refills | Status: DC

## 2019-08-18 DIAGNOSIS — Z961 Presence of intraocular lens: Secondary | ICD-10-CM | POA: Diagnosis not present

## 2019-08-18 DIAGNOSIS — H524 Presbyopia: Secondary | ICD-10-CM | POA: Diagnosis not present

## 2019-08-18 DIAGNOSIS — H5203 Hypermetropia, bilateral: Secondary | ICD-10-CM | POA: Diagnosis not present

## 2019-09-09 ENCOUNTER — Ambulatory Visit (INDEPENDENT_AMBULATORY_CARE_PROVIDER_SITE_OTHER): Payer: Medicare HMO | Admitting: Family Medicine

## 2019-09-09 ENCOUNTER — Other Ambulatory Visit: Payer: Self-pay

## 2019-09-09 ENCOUNTER — Encounter: Payer: Self-pay | Admitting: Family Medicine

## 2019-09-09 VITALS — BP 146/72 | HR 82 | Resp 15 | Ht 64.0 in | Wt 159.0 lb

## 2019-09-09 DIAGNOSIS — F322 Major depressive disorder, single episode, severe without psychotic features: Secondary | ICD-10-CM | POA: Diagnosis not present

## 2019-09-09 DIAGNOSIS — M25512 Pain in left shoulder: Secondary | ICD-10-CM

## 2019-09-09 DIAGNOSIS — R7303 Prediabetes: Secondary | ICD-10-CM

## 2019-09-09 DIAGNOSIS — E559 Vitamin D deficiency, unspecified: Secondary | ICD-10-CM

## 2019-09-09 DIAGNOSIS — G8929 Other chronic pain: Secondary | ICD-10-CM | POA: Diagnosis not present

## 2019-09-09 DIAGNOSIS — Z1231 Encounter for screening mammogram for malignant neoplasm of breast: Secondary | ICD-10-CM | POA: Diagnosis not present

## 2019-09-09 DIAGNOSIS — E663 Overweight: Secondary | ICD-10-CM

## 2019-09-09 DIAGNOSIS — E7849 Other hyperlipidemia: Secondary | ICD-10-CM

## 2019-09-09 DIAGNOSIS — I10 Essential (primary) hypertension: Secondary | ICD-10-CM

## 2019-09-09 DIAGNOSIS — M25519 Pain in unspecified shoulder: Secondary | ICD-10-CM | POA: Insufficient documentation

## 2019-09-09 DIAGNOSIS — M25511 Pain in right shoulder: Secondary | ICD-10-CM

## 2019-09-09 DIAGNOSIS — M797 Fibromyalgia: Secondary | ICD-10-CM

## 2019-09-09 MED ORDER — CYCLOBENZAPRINE HCL 5 MG PO TABS: 5.0000 mg | ORAL_TABLET | Freq: Every day | ORAL | 2 refills | Status: DC

## 2019-09-09 MED ORDER — KETOROLAC TROMETHAMINE 60 MG/2ML IM SOLN
60.0000 mg | Freq: Once | INTRAMUSCULAR | Status: AC
  Administered 2019-09-09: 60 mg via INTRAMUSCULAR

## 2019-09-09 MED ORDER — AMLODIPINE BESYLATE 2.5 MG PO TABS: 2.5000 mg | ORAL_TABLET | Freq: Every day | ORAL | 3 refills | Status: DC

## 2019-09-09 NOTE — Assessment & Plan Note (Addendum)
Uncontrolled , refer to therapy , may need Psych also, situational lost  Her son to suicide recently

## 2019-09-09 NOTE — Patient Instructions (Addendum)
Annual physical exam in office with MD in 2 to 3 months call if you need me sooner.  Happy belated birthday!  Comdolence and prayers  Toradol 60 mg IM in office today.  For shoulder pain.  You are referred to therapist to Dr. Michail Sermon to help with depression no new additional medicine at this time.  If needed he will refer you to psychiatry as well for medication management.  For blood pressure new is amlodipine 2.5 mg daily as your blood pressure remains above normal.  Please increase vegetable and fruits and reduce salt intake in your diet also.  For muscle spasm and generalized pain associated with fibromyalgia Flexeril 1 tablet at bedtime is recommended as prescribed.  Labs today CBC lipid CMP and EGFR vitamin D.  It is important that you exercise regularly at least 30 minutes 5 times a week. If you develop chest pain, have severe difficulty breathing, or feel very tired, stop exercising immediately and seek medical attention     DASH Eating Plan DASH stands for "Dietary Approaches to Stop Hypertension." The DASH eating plan is a healthy eating plan that has been shown to reduce high blood pressure (hypertension). It may also reduce your risk for type 2 diabetes, heart disease, and stroke. The DASH eating plan may also help with weight loss. What are tips for following this plan?  General guidelines  Avoid eating more than 2,300 mg (milligrams) of salt (sodium) a day. If you have hypertension, you may need to reduce your sodium intake to 1,500 mg a day.  Limit alcohol intake to no more than 1 drink a day for nonpregnant women and 2 drinks a day for men. One drink equals 12 oz of beer, 5 oz of wine, or 1 oz of hard liquor.  Work with your health care provider to maintain a healthy body weight or to lose weight. Ask what an ideal weight is for you.  Get at least 30 minutes of exercise that causes your heart to beat faster (aerobic exercise) most days of the week. Activities may  include walking, swimming, or biking.  Work with your health care provider or diet and nutrition specialist (dietitian) to adjust your eating plan to your individual calorie needs. Reading food labels   Check food labels for the amount of sodium per serving. Choose foods with less than 5 percent of the Daily Value of sodium. Generally, foods with less than 300 mg of sodium per serving fit into this eating plan.  To find whole grains, look for the word "whole" as the first word in the ingredient list. Shopping  Buy products labeled as "low-sodium" or "no salt added."  Buy fresh foods. Avoid canned foods and premade or frozen meals. Cooking  Avoid adding salt when cooking. Use salt-free seasonings or herbs instead of table salt or sea salt. Check with your health care provider or pharmacist before using salt substitutes.  Do not fry foods. Cook foods using healthy methods such as baking, boiling, grilling, and broiling instead.  Cook with heart-healthy oils, such as olive, canola, soybean, or sunflower oil. Meal planning  Eat a balanced diet that includes: ? 5 or more servings of fruits and vegetables each day. At each meal, try to fill half of your plate with fruits and vegetables. ? Up to 6-8 servings of whole grains each day. ? Less than 6 oz of lean meat, poultry, or fish each day. A 3-oz serving of meat is about the same size as a deck  of cards. One egg equals 1 oz. ? 2 servings of low-fat dairy each day. ? A serving of nuts, seeds, or beans 5 times each week. ? Heart-healthy fats. Healthy fats called Omega-3 fatty acids are found in foods such as flaxseeds and coldwater fish, like sardines, salmon, and mackerel.  Limit how much you eat of the following: ? Canned or prepackaged foods. ? Food that is high in trans fat, such as fried foods. ? Food that is high in saturated fat, such as fatty meat. ? Sweets, desserts, sugary drinks, and other foods with added sugar. ? Full-fat  dairy products.  Do not salt foods before eating.  Try to eat at least 2 vegetarian meals each week.  Eat more home-cooked food and less restaurant, buffet, and fast food.  When eating at a restaurant, ask that your food be prepared with less salt or no salt, if possible. What foods are recommended? The items listed may not be a complete list. Talk with your dietitian about what dietary choices are best for you. Grains Whole-grain or whole-wheat bread. Whole-grain or whole-wheat pasta. Brown rice. Oatmeal. Quinoa. Bulgur. Whole-grain and low-sodium cereals. Pita bread. Low-fat, low-sodium crackers. Whole-wheat flour tortillas. Vegetables Fresh or frozen vegetables (raw, steamed, roasted, or grilled). Low-sodium or reduced-sodium tomato and vegetable juice. Low-sodium or reduced-sodium tomato sauce and tomato paste. Low-sodium or reduced-sodium canned vegetables. Fruits All fresh, dried, or frozen fruit. Canned fruit in natural juice (without added sugar). Meat and other protein foods Skinless chicken or turkey. Ground chicken or turkey. Pork with fat trimmed off. Fish and seafood. Egg whites. Dried beans, peas, or lentils. Unsalted nuts, nut butters, and seeds. Unsalted canned beans. Lean cuts of beef with fat trimmed off. Low-sodium, lean deli meat. Dairy Low-fat (1%) or fat-free (skim) milk. Fat-free, low-fat, or reduced-fat cheeses. Nonfat, low-sodium ricotta or cottage cheese. Low-fat or nonfat yogurt. Low-fat, low-sodium cheese. Fats and oils Soft margarine without trans fats. Vegetable oil. Low-fat, reduced-fat, or light mayonnaise and salad dressings (reduced-sodium). Canola, safflower, olive, soybean, and sunflower oils. Avocado. Seasoning and other foods Herbs. Spices. Seasoning mixes without salt. Unsalted popcorn and pretzels. Fat-free sweets. What foods are not recommended? The items listed may not be a complete list. Talk with your dietitian about what dietary choices are best  for you. Grains Baked goods made with fat, such as croissants, muffins, or some breads. Dry pasta or rice meal packs. Vegetables Creamed or fried vegetables. Vegetables in a cheese sauce. Regular canned vegetables (not low-sodium or reduced-sodium). Regular canned tomato sauce and paste (not low-sodium or reduced-sodium). Regular tomato and vegetable juice (not low-sodium or reduced-sodium). Pickles. Olives. Fruits Canned fruit in a light or heavy syrup. Fried fruit. Fruit in cream or butter sauce. Meat and other protein foods Fatty cuts of meat. Ribs. Fried meat. Bacon. Sausage. Bologna and other processed lunch meats. Salami. Fatback. Hotdogs. Bratwurst. Salted nuts and seeds. Canned beans with added salt. Canned or smoked fish. Whole eggs or egg yolks. Chicken or turkey with skin. Dairy Whole or 2% milk, cream, and half-and-half. Whole or full-fat cream cheese. Whole-fat or sweetened yogurt. Full-fat cheese. Nondairy creamers. Whipped toppings. Processed cheese and cheese spreads. Fats and oils Butter. Stick margarine. Lard. Shortening. Ghee. Bacon fat. Tropical oils, such as coconut, palm kernel, or palm oil. Seasoning and other foods Salted popcorn and pretzels. Onion salt, garlic salt, seasoned salt, table salt, and sea salt. Worcestershire sauce. Tartar sauce. Barbecue sauce. Teriyaki sauce. Soy sauce, including reduced-sodium. Steak sauce. Canned and   packaged gravies. Fish sauce. Oyster sauce. Cocktail sauce. Horseradish that you find on the shelf. Ketchup. Mustard. Meat flavorings and tenderizers. Bouillon cubes. Hot sauce and Tabasco sauce. Premade or packaged marinades. Premade or packaged taco seasonings. Relishes. Regular salad dressings. Where to find more information:  National Heart, Lung, and [place]: https://wilson-eaton.com/  American Heart Association: www.heart.org Summary  The DASH eating plan is a healthy eating plan that has been shown to reduce high blood pressure  (hypertension). It may also reduce your risk for type 2 diabetes, heart disease, and stroke.  With the DASH eating plan, you should limit salt (sodium) intake to 2,300 mg a day. If you have hypertension, you may need to reduce your sodium intake to 1,500 mg a day.  When on the DASH eating plan, aim to eat more fresh fruits and vegetables, whole grains, lean proteins, low-fat dairy, and heart-healthy fats.  Work with your health care provider or diet and nutrition specialist (dietitian) to adjust your eating plan to your individual calorie needs. This information is not intended to replace advice given to you by your health care provider. Make sure you discuss any questions you have with your health care provider. Document Revised: 01/19/2017 Document Reviewed: 01/31/2016 Elsevier Patient Education  2020 [place] for choosing [facility], we consider it a privelige to serve you.

## 2019-09-09 NOTE — Assessment & Plan Note (Signed)
Increased spasm amd stiffness, start bedtime flexeril

## 2019-09-09 NOTE — Assessment & Plan Note (Signed)
Increased and uncontrolled.  Toradol 60 mg IM in office today.

## 2019-09-09 NOTE — Assessment & Plan Note (Signed)
Uncontrolled , start amlodipine 2.5 mg daily DASH diet and commitment to daily physical activity for a minimum of 30 minutes discussed and encouraged, as a part of hypertension management. The importance of attaining a healthy weight is also discussed.  BP/Weight 09/09/2019 06/25/2019 06/12/2019 04/15/2019 11/12/2018 11/11/2018 6/94/5038  Systolic BP 882 800 349 179 150 569 794  Diastolic BP 72 68 69 60 63 52 64  Wt. (Lbs) 159 161.2 166.6 162 162.8 162 156  BMI 27.29 27.67 28.6 27.81 27.94 27.81 26.78

## 2019-09-11 DIAGNOSIS — I10 Essential (primary) hypertension: Secondary | ICD-10-CM | POA: Diagnosis not present

## 2019-09-11 DIAGNOSIS — E559 Vitamin D deficiency, unspecified: Secondary | ICD-10-CM | POA: Diagnosis not present

## 2019-09-11 DIAGNOSIS — E7849 Other hyperlipidemia: Secondary | ICD-10-CM | POA: Diagnosis not present

## 2019-09-12 LAB — VITAMIN D 25 HYDROXY (VIT D DEFICIENCY, FRACTURES): Vit D, 25-Hydroxy: 20 ng/mL — ABNORMAL LOW (ref 30.0–100.0)

## 2019-09-12 LAB — LIPID PANEL
Chol/HDL Ratio: 5.1 ratio — ABNORMAL HIGH (ref 0.0–4.4)
Cholesterol, Total: 225 mg/dL — ABNORMAL HIGH (ref 100–199)
HDL: 44 mg/dL (ref >39)
LDL Chol Calc (NIH): 139 mg/dL — ABNORMAL HIGH (ref 0–99)
Triglycerides: 232 mg/dL — ABNORMAL HIGH (ref 0–149)
VLDL Cholesterol Cal: 42 mg/dL — ABNORMAL HIGH (ref 5–40)

## 2019-09-12 LAB — CMP14+EGFR
ALT: 20 IU/L (ref 0–32)
AST: 25 IU/L (ref 0–40)
Albumin/Globulin Ratio: 1.5 (ref 1.2–2.2)
Albumin: 4.4 g/dL (ref 3.6–4.6)
Alkaline Phosphatase: 77 IU/L (ref 48–121)
BUN/Creatinine Ratio: 16 (ref 12–28)
BUN: 14 mg/dL (ref 8–27)
Bilirubin Total: 0.4 mg/dL (ref 0.0–1.2)
CO2: 25 mmol/L (ref 20–29)
Calcium: 9.8 mg/dL (ref 8.7–10.3)
Chloride: 102 mmol/L (ref 96–106)
Creatinine, Ser: 0.87 mg/dL (ref 0.57–1.00)
GFR calc Af Amer: 72 mL/min/{1.73_m2} (ref >59)
GFR calc non Af Amer: 63 mL/min/{1.73_m2} (ref >59)
Globulin, Total: 3 g/dL (ref 1.5–4.5)
Glucose: 99 mg/dL (ref 65–99)
Potassium: 4.1 mmol/L (ref 3.5–5.2)
Sodium: 142 mmol/L (ref 134–144)
Total Protein: 7.4 g/dL (ref 6.0–8.5)

## 2019-09-12 LAB — CBC
Hematocrit: 40.8 % (ref 34.0–46.6)
Hemoglobin: 14.1 g/dL (ref 11.1–15.9)
MCH: 31.1 pg (ref 26.6–33.0)
MCHC: 34.6 g/dL (ref 31.5–35.7)
MCV: 90 fL (ref 79–97)
Platelets: 268 10*3/uL (ref 150–450)
RBC: 4.53 x10E6/uL (ref 3.77–5.28)
RDW: 13.1 % (ref 11.7–15.4)
WBC: 7.1 10*3/uL (ref 3.4–10.8)

## 2019-09-13 ENCOUNTER — Encounter: Payer: Self-pay | Admitting: Family Medicine

## 2019-09-13 NOTE — Progress Notes (Signed)
Andrea Santiago     MRN: [id number]      DOB: 1940-11-01   HPI [name] is here for follow up and re-evaluation of chronic medical conditions, medication management and review of any available recent lab and radiology data.  Preventive health is updated, specifically  Cancer screening and Immunization.   C/o flare in fibromyalgia since stopping flexeril and requests it be resumed. C/o increased right shoulder pain and requests injection Lost her son to suicide in April and the only living son has not spoken to her in years, interested in therapy The PT denies any adverse reactions to current medications since the last visit.    ROS Denies recent fever or chills. Denies sinus pressure, nasal congestion, ear pain or sore throat. Denies chest congestion, productive cough or wheezing. Denies chest pains, palpitations and leg swelling Denies abdominal pain, nausea, vomiting,diarrhea or constipation.   Denies dysuria, frequency, hesitancy or incontinence. Denies headaches, seizures, numbness, or tingling. . Denies skin break down or rash.   PE  BP (!) 146/72   Pulse 82   Resp 15   Ht 5\' 4"  (1.626 m)   Wt 159 lb (72.1 kg)   SpO2 97%   BMI 27.29 kg/m   Patient alert and oriented and in no cardiopulmonary distress.  HEENT: No facial asymmetry, EOMI,     Neck decreased ROM.  Chest: Clear to auscultation bilaterally.  CVS: S1, S2 no murmurs, no S3.Regular rate.  ABD: Soft non tender.   Ext: No edema  MS: decreased  ROM spine,, hips and knees.Markedly reduced in  shoulders   Skin: Intact, no ulcerations or rash noted.  Psych: Good eye contact, normal affect. Memory intact not anxious mildly depressed appearing.  CNS: CN 2-12 intact, power,  normal throughout.no focal deficits noted.   Assessment & Plan  Hypertension Uncontrolled , start amlodipine 2.5 mg daily DASH diet and commitment to daily physical activity for a minimum of 30 minutes discussed and encouraged,  as a part of hypertension management. The importance of attaining a healthy weight is also discussed.  BP/Weight 09/09/2019 06/25/2019 06/12/2019 04/15/2019 11/12/2018 11/11/2018 3/32/9518  Systolic BP 841 660 630 160 109 323 557  Diastolic BP 72 68 69 60 63 52 64  Wt. (Lbs) 159 161.2 166.6 162 162.8 162 156  BMI 27.29 27.67 28.6 27.81 27.94 27.81 26.78       Depression, major, single episode, severe (HCC) Uncontrolled , refer to therapy , may need Psych also, situational lost  Her son to suicide recently   Fibromyalgia Increased spasm amd stiffness, start bedtime flexeril  Shoulder pain Increased and uncontrolled.  Toradol 60 mg IM in office today.  Prediabetes Patient educated about the importance of limiting  Carbohydrate intake , the need to commit to daily physical activity for a minimum of 30 minutes , and to commit weight loss. The fact that changes in all these areas will reduce or eliminate all together the development of diabetes is stressed.   Diabetic Labs Latest Ref Rng & Units 09/11/2019 04/08/2019 10/02/2018 04/29/2018 12/31/2017  HbA1c <5.7 % of total Hgb - 6.1(H) 6.2(H) 6.4(H) -  Chol 100 - 199 mg/dL 225(H) 196 229(H) 212(H) 210(H)  HDL >39 mg/dL 44 49(L) 40(L) 53 45(L)  Calc LDL 0 - 99 mg/dL 139(H) 116(H) 148(H) 132(H) 132(H)  Triglycerides 0 - 149 mg/dL 232(H) 192(H) 269(H) 154(H) 191(H)  Creatinine 0.57 - 1.00 mg/dL 0.87 0.77 0.79 0.82 0.66   BP/Weight 09/09/2019 06/25/2019 06/12/2019 04/15/2019 11/12/2018  11/11/2018 9/79/4801  Systolic BP 655 374 827 078 675 449 201  Diastolic BP 72 68 69 60 63 52 64  Wt. (Lbs) 159 161.2 166.6 162 162.8 162 156  BMI 27.29 27.67 28.6 27.81 27.94 27.81 26.78   No flowsheet data found.    Hyperlipemia Hyperlipidemia:Low fat diet discussed and encouraged.   Lipid Panel  Lab Results  Component Value Date   CHOL 225 (H) 09/11/2019   HDL 44 09/11/2019   LDLCALC 139 (H) 09/11/2019   LDLDIRECT 141 (H) 10/31/2007   TRIG 232 (H)  09/11/2019   CHOLHDL 5.1 (H) 09/11/2019  needs to reduce fat in diet, uncontrolled     Overweight  Patient re-educated about  the importance of commitment to a  minimum of 150 minutes of exercise per week as able.  The importance of healthy food choices with portion control discussed, as well as eating regularly and within a 12 hour window most days. The need to choose "clean , green" food 50 to 75% of the time is discussed, as well as to make water the primary drink and set a goal of 64 ounces water daily.    Weight /BMI 09/09/2019 06/25/2019 06/12/2019  WEIGHT 159 lb 161 lb 3.2 oz 166 lb 9.6 oz  HEIGHT 5\' 4"  5\' 4"  5\' 4"   BMI 27.29 kg/m2 27.67 kg/m2 28.6 kg/m2

## 2019-09-13 NOTE — Assessment & Plan Note (Signed)
Hyperlipidemia:Low fat diet discussed and encouraged.   Lipid Panel  Lab Results  Component Value Date   CHOL 225 (H) 09/11/2019   HDL 44 09/11/2019   LDLCALC 139 (H) 09/11/2019   LDLDIRECT 141 (H) 10/31/2007   TRIG 232 (H) 09/11/2019   CHOLHDL 5.1 (H) 09/11/2019  needs to reduce fat in diet, uncontrolled

## 2019-09-13 NOTE — Assessment & Plan Note (Signed)
  Patient re-educated about  the importance of commitment to a  minimum of 150 minutes of exercise per week as able.  The importance of healthy food choices with portion control discussed, as well as eating regularly and within a 12 hour window most days. The need to choose "clean , green" food 50 to 75% of the time is discussed, as well as to make water the primary drink and set a goal of 64 ounces water daily.    Weight /BMI 09/09/2019 06/25/2019 06/12/2019  WEIGHT 159 lb 161 lb 3.2 oz 166 lb 9.6 oz  HEIGHT 5\' 4"  5\' 4"  5\' 4"   BMI 27.29 kg/m2 27.67 kg/m2 28.6 kg/m2

## 2019-09-13 NOTE — Assessment & Plan Note (Signed)
Patient educated about the importance of limiting  Carbohydrate intake , the need to commit to daily physical activity for a minimum of 30 minutes , and to commit weight loss. The fact that changes in all these areas will reduce or eliminate all together the development of diabetes is stressed.   Diabetic Labs Latest Ref Rng & Units 09/11/2019 04/08/2019 10/02/2018 04/29/2018 12/31/2017  HbA1c <5.7 % of total Hgb - 6.1(H) 6.2(H) 6.4(H) -  Chol 100 - 199 mg/dL 225(H) 196 229(H) 212(H) 210(H)  HDL >39 mg/dL 44 49(L) 40(L) 53 45(L)  Calc LDL 0 - 99 mg/dL 139(H) 116(H) 148(H) 132(H) 132(H)  Triglycerides 0 - 149 mg/dL 232(H) 192(H) 269(H) 154(H) 191(H)  Creatinine 0.57 - 1.00 mg/dL 0.87 0.77 0.79 0.82 0.66   BP/Weight 09/09/2019 06/25/2019 06/12/2019 04/15/2019 11/12/2018 11/11/2018 3/73/6681  Systolic BP 594 707 615 183 437 357 897  Diastolic BP 72 68 69 60 63 52 64  Wt. (Lbs) 159 161.2 166.6 162 162.8 162 156  BMI 27.29 27.67 28.6 27.81 27.94 27.81 26.78   No flowsheet data found.

## 2019-10-02 ENCOUNTER — Telehealth (INDEPENDENT_AMBULATORY_CARE_PROVIDER_SITE_OTHER): Payer: Medicare HMO | Admitting: Family Medicine

## 2019-10-02 ENCOUNTER — Encounter: Payer: Self-pay | Admitting: Family Medicine

## 2019-10-02 ENCOUNTER — Other Ambulatory Visit: Payer: Self-pay

## 2019-10-02 VITALS — BP 146/72 | Ht 64.0 in | Wt 159.0 lb

## 2019-10-02 DIAGNOSIS — Z0000 Encounter for general adult medical examination without abnormal findings: Secondary | ICD-10-CM

## 2019-10-02 NOTE — Patient Instructions (Signed)
Andrea Santiago , Thank you for taking time to come for your Medicare Wellness Visit. I appreciate your ongoing commitment to your health goals. Please review the following plan we discussed and let me know if I can assist you in the future.   Screening recommendations/referrals: Recommended yearly ophthalmology/optometry visit for glaucoma screening and checkup Recommended yearly dental visit for hygiene and checkup  Vaccinations: up to date  Advanced directives: discussed  Conditions/risks identified: Falls, Blood pressure   Next appointment: 12/11/2019- in office, 1 year for AWV   Preventive Care 81 Years and Older, Female Preventive care refers to lifestyle choices and visits with your health care provider that can promote health and wellness. What does preventive care include?  A yearly physical exam. This is also called an annual well check.  Dental exams once or twice a year.  Routine eye exams. Ask your health care provider how often you should have your eyes checked.  Personal lifestyle choices, including:  Daily care of your teeth and gums.  Regular physical activity.  Eating a healthy diet.  Avoiding tobacco and drug use.  Limiting alcohol use.  Practicing safe sex.  Taking low-dose aspirin every day.  Taking vitamin and mineral supplements as recommended by your health care provider. What happens during an annual well check? The services and screenings done by your health care provider during your annual well check will depend on your age, overall health, lifestyle risk factors, and family history of disease. Counseling  Your health care provider may ask you questions about your:  Alcohol use.  Tobacco use.  Drug use.  Emotional well-being.  Home and relationship well-being.  Sexual activity.  Eating habits.  History of falls.  Memory and ability to understand (cognition).  Work and work Statistician.  Reproductive health. Screening  You may  have the following tests or measurements:  Height, weight, and BMI.  Blood pressure.  Lipid and cholesterol levels. These may be checked every 5 years, or more frequently if you are over 50 years old.  Skin check.  Lung cancer screening. You may have this screening every year starting at age 62 if you have a 30-pack-year history of smoking and currently smoke or have quit within the past 15 years.  Fecal occult blood test (FOBT) of the stool. You may have this test every year starting at age 20.  Flexible sigmoidoscopy or colonoscopy. You may have a sigmoidoscopy every 5 years or a colonoscopy every 10 years starting at age 13.  Hepatitis C blood test.  Hepatitis B blood test.  Sexually transmitted disease (STD) testing.  Diabetes screening. This is done by checking your blood sugar (glucose) after you have not eaten for a while (fasting). You may have this done every 1-3 years.  Bone density scan. This is done to screen for osteoporosis. You may have this done starting at age 97.  Mammogram. This may be done every 1-2 years. Talk to your health care provider about how often you should have regular mammograms. Talk with your health care provider about your test results, treatment options, and if necessary, the need for more tests. Vaccines  Your health care provider may recommend certain vaccines, such as:  Influenza vaccine. This is recommended every year.  Tetanus, diphtheria, and acellular pertussis (Tdap, Td) vaccine. You may need a Td booster every 10 years.  Zoster vaccine. You may need this after age 78.  Pneumococcal 13-valent conjugate (PCV13) vaccine. One dose is recommended after age 10.  Pneumococcal polysaccharide (  PPSV23) vaccine. One dose is recommended after age 44. Talk to your health care provider about which screenings and vaccines you need and how often you need them. This information is not intended to replace advice given to you by your health care  provider. Make sure you discuss any questions you have with your health care provider. Document Released: 03/05/2015 Document Revised: 10/27/2015 Document Reviewed: 12/08/2014 Elsevier Interactive Patient Education  2017 [place] Prevention in the Home Falls can cause injuries. They can happen to people of all ages. There are many things you can do to make your home safe and to help prevent falls. What can I do on the outside of my home?  Regularly fix the edges of walkways and driveways and fix any cracks.  Remove anything that might make you trip as you walk through a door, such as a raised step or threshold.  Trim any bushes or trees on the path to your home.  Use bright outdoor lighting.  Clear any walking paths of anything that might make someone trip, such as rocks or tools.  Regularly check to see if handrails are loose or broken. Make sure that both sides of any steps have handrails.  Any raised decks and porches should have guardrails on the edges.  Have any leaves, snow, or ice cleared regularly.  Use sand or salt on walking paths during winter.  Clean up any spills in your garage right away. This includes oil or grease spills. What can I do in the bathroom?  Use night lights.  Install grab bars by the toilet and in the tub and shower. Do not use towel bars as grab bars.  Use non-skid mats or decals in the tub or shower.  If you need to sit down in the shower, use a plastic, non-slip stool.  Keep the floor dry. Clean up any water that spills on the floor as soon as it happens.  Remove soap buildup in the tub or shower regularly.  Attach bath mats securely with double-sided non-slip rug tape.  Do not have throw rugs and other things on the floor that can make you trip. What can I do in the bedroom?  Use night lights.  Make sure that you have a light by your bed that is easy to reach.  Do not use any sheets or blankets that are too big for your  bed. They should not hang down onto the floor.  Have a firm chair that has side arms. You can use this for support while you get dressed.  Do not have throw rugs and other things on the floor that can make you trip. What can I do in the kitchen?  Clean up any spills right away.  Avoid walking on wet floors.  Keep items that you use a lot in easy-to-reach places.  If you need to reach something above you, use a strong step stool that has a grab bar.  Keep electrical cords out of the way.  Do not use floor polish or wax that makes floors slippery. If you must use wax, use non-skid floor wax.  Do not have throw rugs and other things on the floor that can make you trip. What can I do with my stairs?  Do not leave any items on the stairs.  Make sure that there are handrails on both sides of the stairs and use them. Fix handrails that are broken or loose. Make sure that handrails are as long as  the stairways.  Check any carpeting to make sure that it is firmly attached to the stairs. Fix any carpet that is loose or worn.  Avoid having throw rugs at the top or bottom of the stairs. If you do have throw rugs, attach them to the floor with carpet tape.  Make sure that you have a light switch at the top of the stairs and the bottom of the stairs. If you do not have them, ask someone to add them for you. What else can I do to help prevent falls?  Wear shoes that:  Do not have high heels.  Have rubber bottoms.  Are comfortable and fit you well.  Are closed at the toe. Do not wear sandals.  If you use a stepladder:  Make sure that it is fully opened. Do not climb a closed stepladder.  Make sure that both sides of the stepladder are locked into place.  Ask someone to hold it for you, if possible.  Clearly mark and make sure that you can see:  Any grab bars or handrails.  First and last steps.  Where the edge of each step is.  Use tools that help you move around (mobility  aids) if they are needed. These include:  Canes.  Walkers.  Scooters.  Crutches.  Turn on the lights when you go into a dark area. Replace any light bulbs as soon as they burn out.  Set up your furniture so you have a clear path. Avoid moving your furniture around.  If any of your floors are uneven, fix them.  If there are any pets around you, be aware of where they are.  Review your medicines with your doctor. Some medicines can make you feel dizzy. This can increase your chance of falling. Ask your doctor what other things that you can do to help prevent falls. This information is not intended to replace advice given to you by your health care provider. Make sure you discuss any questions you have with your health care provider. Document Released: 12/03/2008 Document Revised: 07/15/2015 Document Reviewed: 03/13/2014 Elsevier Interactive Patient Education  2017 [employer].
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
## 2019-10-13 ENCOUNTER — Other Ambulatory Visit: Payer: Self-pay | Admitting: Family Medicine

## 2019-10-16 ENCOUNTER — Other Ambulatory Visit: Payer: Self-pay | Admitting: Family Medicine

## 2019-11-12 ENCOUNTER — Ambulatory Visit (INDEPENDENT_AMBULATORY_CARE_PROVIDER_SITE_OTHER): Payer: Medicare HMO

## 2019-11-12 DIAGNOSIS — I351 Nonrheumatic aortic (valve) insufficiency: Secondary | ICD-10-CM

## 2019-11-12 LAB — ECHOCARDIOGRAM COMPLETE
AR max vel: 0.82 cm2
AV Area VTI: 0.85 cm2
AV Area mean vel: 0.78 cm2
AV Mean grad: 23 mmHg
AV Peak grad: 37.9 mmHg
Ao pk vel: 3.08 m/s
Area-P 1/2: 4.36 cm2
Calc EF: 63.1 %
MV M vel: 4.89 m/s
MV Peak grad: 95.8 mmHg
P 1/2 time: 454 msec
S' Lateral: 2.83 cm
Single Plane A2C EF: 66.8 %
Single Plane A4C EF: 61.4 %

## 2019-11-14 ENCOUNTER — Ambulatory Visit (HOSPITAL_COMMUNITY): Payer: Medicare HMO

## 2019-11-14 ENCOUNTER — Ambulatory Visit: Payer: Medicare HMO | Admitting: Cardiovascular Disease
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
## 2019-11-17 ENCOUNTER — Ambulatory Visit: Payer: Medicare HMO | Admitting: Family Medicine

## 2019-11-27 ENCOUNTER — Ambulatory Visit (HOSPITAL_COMMUNITY): Payer: Medicare HMO

## 2019-11-28 ENCOUNTER — Ambulatory Visit (HOSPITAL_COMMUNITY)
Admission: RE | Admit: 2019-11-28 | Discharge: 2019-11-28 | Disposition: A | Discharge status: Home / Self Care | Payer: Medicare HMO | Source: Ambulatory Visit | Attending: Family Medicine | Admitting: Family Medicine

## 2019-11-28 ENCOUNTER — Other Ambulatory Visit: Payer: Self-pay

## 2019-11-28 DIAGNOSIS — Z1231 Encounter for screening mammogram for malignant neoplasm of breast: Secondary | ICD-10-CM | POA: Diagnosis not present
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
## 2019-12-01 ENCOUNTER — Ambulatory Visit (INDEPENDENT_AMBULATORY_CARE_PROVIDER_SITE_OTHER): Payer: Medicare HMO | Admitting: Family Medicine

## 2019-12-01 ENCOUNTER — Encounter: Payer: Self-pay | Admitting: Family Medicine

## 2019-12-01 VITALS — BP 128/78 | HR 83 | Ht 64.0 in | Wt 162.2 lb

## 2019-12-01 DIAGNOSIS — I350 Nonrheumatic aortic (valve) stenosis: Secondary | ICD-10-CM

## 2019-12-01 DIAGNOSIS — I10 Essential (primary) hypertension: Secondary | ICD-10-CM | POA: Diagnosis not present

## 2019-12-01 DIAGNOSIS — E782 Mixed hyperlipidemia: Secondary | ICD-10-CM

## 2019-12-01 NOTE — Addendum Note (Signed)
Addended by: Laurine Blazer on: 12/01/2019 02:46 PM   Modules accepted: Orders

## 2019-12-01 NOTE — Patient Instructions (Signed)
Medication Instructions:  Continue all current medications.  Labwork: none  Testing/Procedures: Your physician has requested that you have an echocardiogram. Echocardiography is a painless test that uses sound waves to create images of your heart. It provides your doctor with information about the size and shape of your heart and how well your heart's chambers and valves are working. This procedure takes approximately one hour. There are no restrictions for this procedure - DUE JUST PRIOR TO NEXT OFFICE VISIT   Follow-Up: 6 months   Any Other Special Instructions Will Be Listed Below (If Applicable).  If you need a refill on your cardiac medications before your next appointment, please call your pharmacy.  

## 2019-12-06 ENCOUNTER — Other Ambulatory Visit: Payer: Self-pay | Admitting: Family Medicine

## 2019-12-11 ENCOUNTER — Encounter: Payer: Medicare HMO | Admitting: Family Medicine

## 2019-12-12 ENCOUNTER — Ambulatory Visit: Payer: Medicare HMO | Admitting: Physician Assistant

## 2019-12-12 DIAGNOSIS — Z8639 Personal history of other endocrine, nutritional and metabolic disease: Secondary | ICD-10-CM

## 2019-12-12 DIAGNOSIS — M170 Bilateral primary osteoarthritis of knee: Secondary | ICD-10-CM

## 2019-12-12 DIAGNOSIS — M7062 Trochanteric bursitis, left hip: Secondary | ICD-10-CM

## 2019-12-12 DIAGNOSIS — Z8679 Personal history of other diseases of the circulatory system: Secondary | ICD-10-CM

## 2019-12-12 DIAGNOSIS — M19041 Primary osteoarthritis, right hand: Secondary | ICD-10-CM

## 2019-12-12 DIAGNOSIS — E559 Vitamin D deficiency, unspecified: Secondary | ICD-10-CM

## 2019-12-12 DIAGNOSIS — M8589 Other specified disorders of bone density and structure, multiple sites: Secondary | ICD-10-CM

## 2019-12-12 DIAGNOSIS — Z8659 Personal history of other mental and behavioral disorders: Secondary | ICD-10-CM

## 2019-12-12 DIAGNOSIS — M797 Fibromyalgia: Secondary | ICD-10-CM

## 2019-12-12 DIAGNOSIS — M19071 Primary osteoarthritis, right ankle and foot: Secondary | ICD-10-CM

## 2019-12-12 DIAGNOSIS — M5136 Other intervertebral disc degeneration, lumbar region: Secondary | ICD-10-CM

## 2019-12-12 DIAGNOSIS — R5383 Other fatigue: Secondary | ICD-10-CM

## 2020-01-26 ENCOUNTER — Encounter: Payer: Medicare HMO | Admitting: Family Medicine

## 2020-01-28 ENCOUNTER — Encounter: Payer: Self-pay | Admitting: Nurse Practitioner

## 2020-01-28 ENCOUNTER — Telehealth (INDEPENDENT_AMBULATORY_CARE_PROVIDER_SITE_OTHER): Payer: Medicare HMO | Admitting: Nurse Practitioner

## 2020-01-28 DIAGNOSIS — R112 Nausea with vomiting, unspecified: Secondary | ICD-10-CM

## 2020-01-28 DIAGNOSIS — M797 Fibromyalgia: Secondary | ICD-10-CM | POA: Diagnosis not present

## 2020-01-28 DIAGNOSIS — R1110 Vomiting, unspecified: Secondary | ICD-10-CM | POA: Insufficient documentation

## 2020-01-28 MED ORDER — CYCLOBENZAPRINE HCL 5 MG PO TABS
5.0000 mg | ORAL_TABLET | Freq: Every day | ORAL | 2 refills | Status: DC
Start: 2020-01-28 — End: 2020-06-10

## 2020-01-28 MED ORDER — ONDANSETRON 8 MG PO TBDP: 8.0000 mg | ORAL_TABLET | Freq: Three times a day (TID) | ORAL | 0 refills | Status: DC | PRN

## 2020-01-28 NOTE — Assessment & Plan Note (Signed)
-  started last night at 8 pm -last episode was 5 Am this morning -she had coffee around 0830 and had some nausea, but no vomiting -Rx. zofran -f/u if vomiting persists
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
## 2020-01-28 NOTE — Assessment & Plan Note (Signed)
-  she was previously prescribed flexeril for fibromyalgia pain -her rx ran out, and it helps her to sleep at night -Rx. Flexeril qhs

## 2020-02-05 ENCOUNTER — Telehealth (INDEPENDENT_AMBULATORY_CARE_PROVIDER_SITE_OTHER): Payer: Medicare HMO | Admitting: Nurse Practitioner

## 2020-02-05 ENCOUNTER — Other Ambulatory Visit: Payer: Medicare HMO

## 2020-02-05 ENCOUNTER — Ambulatory Visit: Payer: Medicare HMO | Admitting: Physician Assistant

## 2020-02-05 ENCOUNTER — Other Ambulatory Visit: Payer: Self-pay

## 2020-02-05 DIAGNOSIS — R5383 Other fatigue: Secondary | ICD-10-CM

## 2020-02-05 DIAGNOSIS — E559 Vitamin D deficiency, unspecified: Secondary | ICD-10-CM

## 2020-02-05 DIAGNOSIS — Z8679 Personal history of other diseases of the circulatory system: Secondary | ICD-10-CM

## 2020-02-05 DIAGNOSIS — Z8659 Personal history of other mental and behavioral disorders: Secondary | ICD-10-CM

## 2020-02-05 DIAGNOSIS — M797 Fibromyalgia: Secondary | ICD-10-CM

## 2020-02-05 DIAGNOSIS — Z20822 Contact with and (suspected) exposure to covid-19: Secondary | ICD-10-CM | POA: Diagnosis not present

## 2020-02-05 DIAGNOSIS — M7061 Trochanteric bursitis, right hip: Secondary | ICD-10-CM

## 2020-02-05 DIAGNOSIS — M8589 Other specified disorders of bone density and structure, multiple sites: Secondary | ICD-10-CM

## 2020-02-05 DIAGNOSIS — M19071 Primary osteoarthritis, right ankle and foot: Secondary | ICD-10-CM

## 2020-02-05 DIAGNOSIS — M170 Bilateral primary osteoarthritis of knee: Secondary | ICD-10-CM

## 2020-02-05 DIAGNOSIS — Z8639 Personal history of other endocrine, nutritional and metabolic disease: Secondary | ICD-10-CM

## 2020-02-05 DIAGNOSIS — J019 Acute sinusitis, unspecified: Secondary | ICD-10-CM

## 2020-02-05 DIAGNOSIS — M19041 Primary osteoarthritis, right hand: Secondary | ICD-10-CM

## 2020-02-05 DIAGNOSIS — M5136 Other intervertebral disc degeneration, lumbar region: Secondary | ICD-10-CM

## 2020-02-05 MED ORDER — BENZONATATE 100 MG PO CAPS: 100.0000 mg | ORAL_CAPSULE | Freq: Two times a day (BID) | ORAL | 0 refills | Status: DC | PRN

## 2020-02-05 MED ORDER — AZITHROMYCIN 250 MG PO TABS: ORAL_TABLET | ORAL | 0 refills | Status: DC

## 2020-02-05 NOTE — Assessment & Plan Note (Signed)
-  started 2 days ago following vomiting -Rx. z-pack -Rx. Tessalon -will set up for COVID testing
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
## 2020-02-07 LAB — NOVEL CORONAVIRUS, NAA: SARS-CoV-2, NAA: NOT DETECTED

## 2020-02-07 LAB — SPECIMEN STATUS REPORT

## 2020-02-07 LAB — SARS-COV-2, NAA 2 DAY TAT
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
## 2020-02-10 ENCOUNTER — Telehealth: Payer: Self-pay

## 2020-02-10 ENCOUNTER — Other Ambulatory Visit: Payer: Self-pay | Admitting: Nurse Practitioner

## 2020-02-10 MED ORDER — AMOXICILLIN-POT CLAVULANATE 875-125 MG PO TABS: 1.0000 | ORAL_TABLET | Freq: Two times a day (BID) | ORAL | 0 refills | Status: DC

## 2020-02-10 NOTE — Telephone Encounter (Signed)
Pls re direct to Provider weho started treatment for the problem , I will see her next week for her appt then, best to have that Provider manage this

## 2020-02-10 NOTE — Telephone Encounter (Signed)
I sent in augmentin for her.

## 2020-02-10 NOTE — Telephone Encounter (Signed)
Patient had a phone visit Donneta Romberg NP last week. She left a vm stating she isn't any better and she has only one pill left of the antibiotic he prescribed. She thinks she has pneumonia. She is asking for something else to be called in. Please advise

## 2020-02-10 NOTE — Telephone Encounter (Signed)
Please see message below

## 2020-02-11 NOTE — Telephone Encounter (Signed)
Pt informed

## 2020-02-17 ENCOUNTER — Encounter: Payer: Medicare HMO | Admitting: Family Medicine

## 2020-02-23 ENCOUNTER — Ambulatory Visit: Payer: Medicare HMO | Admitting: Physician Assistant
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
## 2020-02-25 ENCOUNTER — Ambulatory Visit (INDEPENDENT_AMBULATORY_CARE_PROVIDER_SITE_OTHER): Payer: Medicare HMO | Admitting: Physician Assistant

## 2020-02-25 ENCOUNTER — Encounter: Payer: Self-pay | Admitting: Physician Assistant

## 2020-02-25 ENCOUNTER — Ambulatory Visit: Payer: Self-pay

## 2020-02-25 ENCOUNTER — Other Ambulatory Visit: Payer: Self-pay

## 2020-02-25 VITALS — BP 134/71 | HR 69 | Resp 16 | Ht 64.0 in | Wt 159.8 lb

## 2020-02-25 DIAGNOSIS — M170 Bilateral primary osteoarthritis of knee: Secondary | ICD-10-CM

## 2020-02-25 DIAGNOSIS — M62838 Other muscle spasm: Secondary | ICD-10-CM

## 2020-02-25 DIAGNOSIS — E559 Vitamin D deficiency, unspecified: Secondary | ICD-10-CM

## 2020-02-25 DIAGNOSIS — M19041 Primary osteoarthritis, right hand: Secondary | ICD-10-CM

## 2020-02-25 DIAGNOSIS — M7062 Trochanteric bursitis, left hip: Secondary | ICD-10-CM

## 2020-02-25 DIAGNOSIS — M5136 Other intervertebral disc degeneration, lumbar region: Secondary | ICD-10-CM

## 2020-02-25 DIAGNOSIS — M19042 Primary osteoarthritis, left hand: Secondary | ICD-10-CM

## 2020-02-25 DIAGNOSIS — M19072 Primary osteoarthritis, left ankle and foot: Secondary | ICD-10-CM

## 2020-02-25 DIAGNOSIS — M542 Cervicalgia: Secondary | ICD-10-CM

## 2020-02-25 DIAGNOSIS — R5383 Other fatigue: Secondary | ICD-10-CM | POA: Diagnosis not present

## 2020-02-25 DIAGNOSIS — M7061 Trochanteric bursitis, right hip: Secondary | ICD-10-CM

## 2020-02-25 DIAGNOSIS — M797 Fibromyalgia: Secondary | ICD-10-CM

## 2020-02-25 DIAGNOSIS — M8589 Other specified disorders of bone density and structure, multiple sites: Secondary | ICD-10-CM | POA: Diagnosis not present

## 2020-02-25 DIAGNOSIS — Z8679 Personal history of other diseases of the circulatory system: Secondary | ICD-10-CM

## 2020-02-25 DIAGNOSIS — M19071 Primary osteoarthritis, right ankle and foot: Secondary | ICD-10-CM

## 2020-02-25 DIAGNOSIS — Z8639 Personal history of other endocrine, nutritional and metabolic disease: Secondary | ICD-10-CM

## 2020-02-25 DIAGNOSIS — Z8659 Personal history of other mental and behavioral disorders: Secondary | ICD-10-CM

## 2020-02-25 NOTE — Patient Instructions (Signed)
Hand Exercises Hand exercises can be helpful for almost anyone. These exercises can strengthen the hands, improve flexibility and movement, and increase blood flow to the hands. These results can make work and daily tasks easier. Hand exercises can be especially helpful for people who have joint pain from arthritis or have nerve damage from overuse (carpal tunnel syndrome). These exercises can also help people who have injured a hand. Exercises Most of these hand exercises are gentle stretching and motion exercises. It is usually safe to do them often throughout the day. Warming up your hands before exercise may help to reduce stiffness. You can do this with gentle massage or by placing your hands in warm water for 10-15 minutes. It is normal to feel some stretching, pulling, tightness, or mild discomfort as you begin new exercises. This will gradually improve. Stop an exercise right away if you feel sudden, severe pain or your pain gets worse. Ask your health care provider which exercises are best for you. Knuckle bend or "claw" fist 1. Stand or sit with your arm, hand, and all five fingers pointed straight up. Make sure to keep your wrist straight during the exercise. 2. Gently bend your fingers down toward your palm until the tips of your fingers are touching the top of your palm. Keep your big knuckle straight and just bend the small knuckles in your fingers. 3. Hold this position for __________ seconds. 4. Straighten (extend) your fingers back to the starting position. Repeat this exercise 5-10 times with each hand. Full finger fist 1. Stand or sit with your arm, hand, and all five fingers pointed straight up. Make sure to keep your wrist straight during the exercise. 2. Gently bend your fingers into your palm until the tips of your fingers are touching the middle of your palm. 3. Hold this position for __________ seconds. 4. Extend your fingers back to the starting position, stretching every  joint fully. Repeat this exercise 5-10 times with each hand. Straight fist 1. Stand or sit with your arm, hand, and all five fingers pointed straight up. Make sure to keep your wrist straight during the exercise. 2. Gently bend your fingers at the big knuckle, where your fingers meet your hand, and the middle knuckle. Keep the knuckle at the tips of your fingers straight and try to touch the bottom of your palm. 3. Hold this position for __________ seconds. 4. Extend your fingers back to the starting position, stretching every joint fully. Repeat this exercise 5-10 times with each hand. Tabletop 1. Stand or sit with your arm, hand, and all five fingers pointed straight up. Make sure to keep your wrist straight during the exercise. 2. Gently bend your fingers at the big knuckle, where your fingers meet your hand, as far down as you can while keeping the small knuckles in your fingers straight. Think of forming a tabletop with your fingers. 3. Hold this position for __________ seconds. 4. Extend your fingers back to the starting position, stretching every joint fully. Repeat this exercise 5-10 times with each hand. Finger spread 1. Place your hand flat on a table with your palm facing down. Make sure your wrist stays straight as you do this exercise. 2. Spread your fingers and thumb apart from each other as far as you can until you feel a gentle stretch. Hold this position for __________ seconds. 3. Bring your fingers and thumb tight together again. Hold this position for __________ seconds. Repeat this exercise 5-10 times with each hand.   Making circles 1. Stand or sit with your arm, hand, and all five fingers pointed straight up. Make sure to keep your wrist straight during the exercise. 2. Make a circle by touching the tip of your thumb to the tip of your index finger. 3. Hold for __________ seconds. Then open your hand wide. 4. Repeat this motion with your thumb and each finger on your  hand. Repeat this exercise 5-10 times with each hand. Thumb motion 1. Sit with your forearm resting on a table and your wrist straight. Your thumb should be facing up toward the ceiling. Keep your fingers relaxed as you move your thumb. 2. Lift your thumb up as high as you can toward the ceiling. Hold for __________ seconds. 3. Bend your thumb across your palm as far as you can, reaching the tip of your thumb for the small finger (pinkie) side of your palm. Hold for __________ seconds. Repeat this exercise 5-10 times with each hand. Grip strengthening  1. Hold a stress ball or other soft ball in the middle of your hand. 2. Slowly increase the pressure, squeezing the ball as much as you can without causing pain. Think of bringing the tips of your fingers into the middle of your palm. All of your finger joints should bend when doing this exercise. 3. Hold your squeeze for __________ seconds, then relax. Repeat this exercise 5-10 times with each hand. Contact a health care provider if:  Your hand pain or discomfort gets much worse when you do an exercise.  Your hand pain or discomfort does not improve within 2 hours after you exercise. If you have any of these problems, stop doing these exercises right away. Do not do them again unless your health care provider says that you can. Get help right away if:  You develop sudden, severe hand pain or swelling. If this happens, stop doing these exercises right away. Do not do them again unless your health care provider says that you can. This information is not intended to replace advice given to you by your health care provider. Make sure you discuss any questions you have with your health care provider. Document Revised: 05/30/2018 Document Reviewed: 02/07/2018 Elsevier Patient Education  2020 [employer] Inc[employer]   Neck Exercises Ask your health care provider which exercises are safe for you. Do exercises exactly as told by your health care provider  and adjust them as directed. It is normal to feel mild stretching, pulling, tightness, or discomfort as you do these exercises. Stop right away if you feel sudden pain or your pain gets worse. Do not begin these exercises until told by your health care provider. Neck exercises can be important for many reasons. They can improve strength and maintain flexibility in your neck, which will help your upper back and prevent neck pain. Stretching exercises Rotation neck stretching  1. Sit in a chair or stand up. 2. Place your feet flat on the floor, shoulder width apart. 3. Slowly turn your head (rotate) to the right until a slight stretch is felt. Turn it all the way to the right so you can look over your right shoulder. Do not tilt or tip your head. 4. Hold this position for 10-30 seconds. 5. Slowly turn your head (rotate) to the left until a slight stretch is felt. Turn it all the way to the left so you can look over your left shoulder. Do not tilt or tip your head. 6. Hold this position for 10-30 seconds. Repeat __________   times. Complete this exercise __________ times a day. Neck retraction 1. Sit in a sturdy chair or stand up. 2. Look straight ahead. Do not bend your neck. 3. Use your fingers to push your chin backward (retraction). Do not bend your neck for this movement. Continue to face straight ahead. If you are doing the exercise properly, you will feel a slight sensation in your throat and a stretch at the back of your neck. 4. Hold the stretch for 1-2 seconds. Repeat __________ times. Complete this exercise __________ times a day. Strengthening exercises Neck press 1. Lie on your back on a firm bed or on the floor with a pillow under your head. 2. Use your neck muscles to push your head down on the pillow and straighten your spine. 3. Hold the position as well as you can. Keep your head facing up (in a neutral position) and your chin tucked. 4. Slowly count to 5 while holding this  position. Repeat __________ times. Complete this exercise __________ times a day. Isometrics These are exercises in which you strengthen the muscles in your neck while keeping your neck still (isometrics). 1. Sit in a supportive chair and place your hand on your forehead. 2. Keep your head and face facing straight ahead. Do not flex or extend your neck while doing isometrics. 3. Push forward with your head and neck while pushing back with your hand. Hold for 10 seconds. 4. Do the sequence again, this time putting your hand against the back of your head. Use your head and neck to push backward against the hand pressure. 5. Finally, do the same exercise on either side of your head, pushing sideways against the pressure of your hand. Repeat __________ times. Complete this exercise __________ times a day. Prone head lifts 1. Lie face-down (prone position), resting on your elbows so that your chest and upper back are raised. 2. Start with your head facing downward, near your chest. Position your chin either on or near your chest. 3. Slowly lift your head upward. Lift until you are looking straight ahead. Then continue lifting your head as far back as you can comfortably stretch. 4. Hold your head up for 5 seconds. Then slowly lower it to your starting position. Repeat __________ times. Complete this exercise __________ times a day. Supine head lifts 1. Lie on your back (supine position), bending your knees to point to the ceiling and keeping your feet flat on the floor. 2. Lift your head slowly off the floor, raising your chin toward your chest. 3. Hold for 5 seconds. Repeat __________ times. Complete this exercise __________ times a day. Scapular retraction 1. Stand with your arms at your sides. Look straight ahead. 2. Slowly pull both shoulders (scapulae) backward and downward (retraction) until you feel a stretch between your shoulder blades in your upper back. 3. Hold for 10-30  seconds. 4. Relax and repeat. Repeat __________ times. Complete this exercise __________ times a day. Contact a health care provider if:  Your neck pain or discomfort gets much worse when you do an exercise.  Your neck pain or discomfort does not improve within 2 hours after you exercise. If you have any of these problems, stop exercising right away. Do not do the exercises again unless your health care provider says that you can. Get help right away if:  You develop sudden, severe neck pain. If this happens, stop exercising right away. Do not do the exercises again unless your health care provider says that you can.   This information is not intended to replace advice given to you by your health care provider. Make sure you discuss any questions you have with your health care provider. Document Revised: 12/05/2017 Document Reviewed: 12/05/2017 Elsevier Patient Education  2020 Elsevier Inc.  

## 2020-03-23 ENCOUNTER — Ambulatory Visit (HOSPITAL_COMMUNITY): Payer: Medicare HMO | Admitting: Physical Therapy

## 2020-03-25 ENCOUNTER — Encounter: Payer: Medicare HMO | Admitting: Family Medicine

## 2020-04-08 ENCOUNTER — Telehealth: Payer: Self-pay

## 2020-04-08 NOTE — Telephone Encounter (Signed)
Patient called and would like to have labs ordered for her CPE appt 05/19/20.

## 2020-04-08 NOTE — Telephone Encounter (Signed)
Pls fasting lipid, cmp and EGFr, TSH and vit D, thanks

## 2020-04-08 NOTE — Telephone Encounter (Signed)
What labs do you want her to have for visit?

## 2020-04-09 ENCOUNTER — Other Ambulatory Visit: Payer: Self-pay

## 2020-04-09 DIAGNOSIS — M810 Age-related osteoporosis without current pathological fracture: Secondary | ICD-10-CM

## 2020-04-09 DIAGNOSIS — R7989 Other specified abnormal findings of blood chemistry: Secondary | ICD-10-CM

## 2020-04-09 DIAGNOSIS — E039 Hypothyroidism, unspecified: Secondary | ICD-10-CM

## 2020-04-09 DIAGNOSIS — E559 Vitamin D deficiency, unspecified: Secondary | ICD-10-CM

## 2020-04-09 DIAGNOSIS — I10 Essential (primary) hypertension: Secondary | ICD-10-CM

## 2020-04-09 DIAGNOSIS — E7849 Other hyperlipidemia: Secondary | ICD-10-CM

## 2020-04-09 DIAGNOSIS — I25119 Atherosclerotic heart disease of native coronary artery with unspecified angina pectoris: Secondary | ICD-10-CM

## 2020-04-09 DIAGNOSIS — R7303 Prediabetes: Secondary | ICD-10-CM

## 2020-04-09 DIAGNOSIS — E663 Overweight: Secondary | ICD-10-CM

## 2020-04-09 NOTE — Telephone Encounter (Signed)
Labs ordered.

## 2020-04-16 ENCOUNTER — Other Ambulatory Visit: Payer: Self-pay | Admitting: Family Medicine

## 2020-05-05 ENCOUNTER — Encounter: Payer: Self-pay | Admitting: Nurse Practitioner

## 2020-05-05 ENCOUNTER — Telehealth (INDEPENDENT_AMBULATORY_CARE_PROVIDER_SITE_OTHER): Payer: Medicare HMO | Admitting: Nurse Practitioner

## 2020-05-05 ENCOUNTER — Other Ambulatory Visit: Payer: Self-pay

## 2020-05-05 DIAGNOSIS — J301 Allergic rhinitis due to pollen: Secondary | ICD-10-CM

## 2020-05-05 MED ORDER — FLUTICASONE PROPIONATE 50 MCG/ACT NA SUSP: NASAL | 6 refills | Status: DC

## 2020-05-05 MED ORDER — AZELASTINE HCL 0.1 % NA SOLN: NASAL | 12 refills | Status: DC

## 2020-05-05 NOTE — Assessment & Plan Note (Signed)
-  Rx. Azelastine -Rx. flonase -if symptoms persist through the weekend or get worse despite medication, would consider abx
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
## 2020-05-06 ENCOUNTER — Telehealth: Payer: Medicare HMO | Admitting: Nurse Practitioner

## 2020-05-19 ENCOUNTER — Encounter: Payer: Medicare HMO | Admitting: Family Medicine

## 2020-06-09 DIAGNOSIS — M810 Age-related osteoporosis without current pathological fracture: Secondary | ICD-10-CM | POA: Diagnosis not present

## 2020-06-09 DIAGNOSIS — E559 Vitamin D deficiency, unspecified: Secondary | ICD-10-CM | POA: Diagnosis not present

## 2020-06-09 DIAGNOSIS — R7303 Prediabetes: Secondary | ICD-10-CM | POA: Diagnosis not present

## 2020-06-09 DIAGNOSIS — E663 Overweight: Secondary | ICD-10-CM | POA: Diagnosis not present

## 2020-06-09 DIAGNOSIS — R7989 Other specified abnormal findings of blood chemistry: Secondary | ICD-10-CM | POA: Diagnosis not present

## 2020-06-09 DIAGNOSIS — E7849 Other hyperlipidemia: Secondary | ICD-10-CM | POA: Diagnosis not present

## 2020-06-09 DIAGNOSIS — I25119 Atherosclerotic heart disease of native coronary artery with unspecified angina pectoris: Secondary | ICD-10-CM | POA: Diagnosis not present

## 2020-06-09 DIAGNOSIS — I10 Essential (primary) hypertension: Secondary | ICD-10-CM | POA: Diagnosis not present

## 2020-06-09 DIAGNOSIS — E039 Hypothyroidism, unspecified: Secondary | ICD-10-CM | POA: Diagnosis not present

## 2020-06-10 ENCOUNTER — Ambulatory Visit (INDEPENDENT_AMBULATORY_CARE_PROVIDER_SITE_OTHER): Payer: Medicare HMO | Admitting: Family Medicine

## 2020-06-10 ENCOUNTER — Other Ambulatory Visit: Payer: Self-pay | Admitting: Family Medicine

## 2020-06-10 ENCOUNTER — Other Ambulatory Visit: Payer: Self-pay

## 2020-06-10 ENCOUNTER — Encounter: Payer: Self-pay | Admitting: Family Medicine

## 2020-06-10 VITALS — BP 120/80 | HR 68 | Temp 98.0°F | Ht 64.0 in | Wt 167.0 lb

## 2020-06-10 DIAGNOSIS — F322 Major depressive disorder, single episode, severe without psychotic features: Secondary | ICD-10-CM | POA: Diagnosis not present

## 2020-06-10 DIAGNOSIS — K5900 Constipation, unspecified: Secondary | ICD-10-CM

## 2020-06-10 DIAGNOSIS — Z0000 Encounter for general adult medical examination without abnormal findings: Secondary | ICD-10-CM

## 2020-06-10 MED ORDER — ONDANSETRON 8 MG PO TBDP: 8.0000 mg | ORAL_TABLET | Freq: Three times a day (TID) | ORAL | 0 refills | Status: DC | PRN

## 2020-06-10 MED ORDER — AZELASTINE HCL 0.1 % NA SOLN: NASAL | 12 refills | Status: DC

## 2020-06-10 MED ORDER — HYDROCHLOROTHIAZIDE 25 MG PO TABS: 1.0000 | ORAL_TABLET | Freq: Every day | ORAL | 3 refills | Status: DC

## 2020-06-10 MED ORDER — EZETIMIBE 10 MG PO TABS: 10.0000 mg | ORAL_TABLET | Freq: Every day | ORAL | 3 refills | Status: DC

## 2020-06-10 MED ORDER — MECLIZINE HCL 25 MG PO TABS: 25.0000 mg | ORAL_TABLET | Freq: Three times a day (TID) | ORAL | 0 refills | Status: DC | PRN

## 2020-06-10 MED ORDER — NIACIN ER (ANTIHYPERLIPIDEMIC) 1000 MG PO TBCR
2000.0000 mg | EXTENDED_RELEASE_TABLET | Freq: Every day | ORAL | 0 refills | Status: DC
Start: 2020-06-10 — End: 2020-09-02

## 2020-06-10 MED ORDER — LUBIPROSTONE 24 MCG PO CAPS: ORAL_CAPSULE | ORAL | 1 refills | Status: DC

## 2020-06-10 MED ORDER — LEVOTHYROXINE SODIUM 88 MCG PO TABS: 88.0000 ug | ORAL_TABLET | Freq: Every day | ORAL | 3 refills | Status: DC

## 2020-06-10 MED ORDER — DICLOFENAC SODIUM 1 % EX GEL: CUTANEOUS | 2 refills | Status: DC

## 2020-06-10 MED ORDER — BUDESONIDE-FORMOTEROL FUMARATE 160-4.5 MCG/ACT IN AERO: 2.0000 | INHALATION_SPRAY | Freq: Two times a day (BID) | RESPIRATORY_TRACT | 12 refills | Status: DC

## 2020-06-10 MED ORDER — CYCLOBENZAPRINE HCL 5 MG PO TABS: 5.0000 mg | ORAL_TABLET | Freq: Every day | ORAL | 2 refills | Status: DC

## 2020-06-10 MED ORDER — AMLODIPINE BESYLATE 2.5 MG PO TABS: 2.5000 mg | ORAL_TABLET | Freq: Every day | ORAL | 1 refills | Status: DC

## 2020-06-10 MED ORDER — FLUTICASONE PROPIONATE 50 MCG/ACT NA SUSP: NASAL | 6 refills | Status: AC

## 2020-06-10 MED ORDER — DULOXETINE HCL 60 MG PO CPEP: 60.0000 mg | ORAL_CAPSULE | Freq: Two times a day (BID) | ORAL | 0 refills | Status: DC

## 2020-06-10 MED ORDER — POTASSIUM CHLORIDE CRYS ER 10 MEQ PO TBCR: EXTENDED_RELEASE_TABLET | ORAL | 1 refills | Status: DC

## 2020-06-10 NOTE — Assessment & Plan Note (Signed)

## 2020-06-10 NOTE — Patient Instructions (Signed)
F/U in 5.5  months with MD, call if you need me sooner  Medications to be refilled by nurse, please do this at checkout  We will call with lab results  Careful not to fall  Continue to keep active in your garden and keep the faith!  Thanks for choosing [facility], we consider it a privelige to serve you.

## 2020-06-10 NOTE — Progress Notes (Signed)
    Andrea Santiago     MRN: [id number]      DOB: 09-30-1940  HPI: Patient is in for annual physical exam. No other health concerns are expressed or addressed at the visit.  PE: BP 120/80   Pulse 68   Temp 98 F (36.7 C) (Temporal)   Ht 5\' 4"  (1.626 m)   Wt 167 lb (75.8 kg)   SpO2 97%   BMI 28.67 kg/m   Pleasant  female, alert and oriented x 3, in no cardio-pulmonary distress. Afebrile. HEENT No facial trauma or asymetry. Sinuses non tender.  Extra occullar muscles intact.. External ears normal, . Neck: supple, no adenopathy,JVD or thyromegaly.No bruits.  Chest: Clear to ascultation bilaterally.No crackles or wheezes. Non tender to palpation    Cardiovascular system; Heart sounds normal,  S1 and  S2 ,no S3.  Systolic murmur, no  thrill. Apical beat not displaced Peripheral pulses normal.  Abdomen: Soft, non tender, no organomegaly or masses. No bruits. Bowel sounds normal. No guarding, tenderness or rebound.      Musculoskeletal exam: Decreased ROM of spine, hips , shoulders and knees.  deformity ,swelling and crepitus noted. No muscle wasting or atrophy.   Neurologic: Cranial nerves 2 to 12 intact. Power, tone ,sensation as normal throughout.  disturbance in gait. No tremor.  Skin: Intact, no ulceration, erythema , scaling or rash noted. Pigmentation normal throughout  Psych; Normal mood and affect. Judgement and concentration normal   Assessment & Plan:  Annual physical exam Annual exam as documented. Counseling done  re healthy lifestyle involving commitment to 150 minutes exercise per week, heart healthy diet, and attaining healthy weight.The importance of adequate sleep also discussed. Regular seat belt use and home safety, is also discussed. Changes in health habits are decided on by the patient with goals and time frames  set for achieving them. Immunization and cancer screening needs are specifically addressed at this visit.

## 2020-06-11 LAB — LIPID PANEL
Chol/HDL Ratio: 5.9 ratio — ABNORMAL HIGH (ref 0.0–4.4)
Cholesterol, Total: 235 mg/dL — ABNORMAL HIGH (ref 100–199)
HDL: 40 mg/dL (ref >39)
LDL Chol Calc (NIH): 147 mg/dL — ABNORMAL HIGH (ref 0–99)
Triglycerides: 260 mg/dL — ABNORMAL HIGH (ref 0–149)
VLDL Cholesterol Cal: 48 mg/dL — ABNORMAL HIGH (ref 5–40)

## 2020-06-11 LAB — CMP14+EGFR
ALT: 21 IU/L (ref 0–32)
AST: 29 IU/L (ref 0–40)
Albumin/Globulin Ratio: 1.6 (ref 1.2–2.2)
Albumin: 4.7 g/dL — ABNORMAL HIGH (ref 3.6–4.6)
Alkaline Phosphatase: 80 IU/L (ref 44–121)
BUN/Creatinine Ratio: 27 (ref 12–28)
BUN: 21 mg/dL (ref 8–27)
Bilirubin Total: 0.3 mg/dL (ref 0.0–1.2)
CO2: 22 mmol/L (ref 20–29)
Calcium: 10.1 mg/dL (ref 8.7–10.3)
Chloride: 102 mmol/L (ref 96–106)
Creatinine, Ser: 0.78 mg/dL (ref 0.57–1.00)
Globulin, Total: 2.9 g/dL (ref 1.5–4.5)
Glucose: 93 mg/dL (ref 65–99)
Potassium: 4.1 mmol/L (ref 3.5–5.2)
Sodium: 140 mmol/L (ref 134–144)
Total Protein: 7.6 g/dL (ref 6.0–8.5)
eGFR: 76 mL/min/{1.73_m2} (ref >59)

## 2020-06-11 LAB — TSH: TSH: 2.39 u[IU]/mL (ref 0.450–4.500)

## 2020-06-11 LAB — VITAMIN D 25 HYDROXY (VIT D DEFICIENCY, FRACTURES): Vit D, 25-Hydroxy: 17.7 ng/mL — ABNORMAL LOW (ref 30.0–100.0)

## 2020-06-15 ENCOUNTER — Other Ambulatory Visit: Payer: Self-pay

## 2020-06-15 DIAGNOSIS — E7849 Other hyperlipidemia: Secondary | ICD-10-CM

## 2020-06-15 DIAGNOSIS — R7303 Prediabetes: Secondary | ICD-10-CM

## 2020-07-14 ENCOUNTER — Ambulatory Visit (INDEPENDENT_AMBULATORY_CARE_PROVIDER_SITE_OTHER): Payer: Medicare HMO

## 2020-07-14 ENCOUNTER — Other Ambulatory Visit: Payer: Self-pay

## 2020-07-14 DIAGNOSIS — I350 Nonrheumatic aortic (valve) stenosis: Secondary | ICD-10-CM

## 2020-07-14 LAB — ECHOCARDIOGRAM COMPLETE
AR max vel: 3.23 cm2
AV Area VTI: 0.95 cm2
AV Area mean vel: 0.96 cm2
AV Mean grad: 27.4 mmHg
AV Peak grad: 46.1 mmHg
AV Vena cont: 0.34 cm
Ao pk vel: 3.4 m/s
Area-P 1/2: 3.54 cm2
Calc EF: 66.3 %
MV M vel: 5.81 m/s
MV Peak grad: 135 mmHg
P 1/2 time: 324 msec
S' Lateral: 2.45 cm
Single Plane A2C EF: 68.2 %
Single Plane A4C EF: 63.2 %

## 2020-07-21 ENCOUNTER — Other Ambulatory Visit: Payer: Self-pay

## 2020-07-21 ENCOUNTER — Ambulatory Visit (INDEPENDENT_AMBULATORY_CARE_PROVIDER_SITE_OTHER): Payer: Medicare HMO | Admitting: Family Medicine

## 2020-07-21 ENCOUNTER — Encounter: Payer: Self-pay | Admitting: Family Medicine

## 2020-07-21 VITALS — BP 121/69 | HR 87 | Resp 16 | Ht 64.0 in | Wt 163.0 lb

## 2020-07-21 DIAGNOSIS — I10 Essential (primary) hypertension: Secondary | ICD-10-CM | POA: Diagnosis not present

## 2020-07-21 DIAGNOSIS — F322 Major depressive disorder, single episode, severe without psychotic features: Secondary | ICD-10-CM | POA: Diagnosis not present

## 2020-07-21 DIAGNOSIS — N644 Mastodynia: Secondary | ICD-10-CM | POA: Diagnosis not present

## 2020-07-21 DIAGNOSIS — E039 Hypothyroidism, unspecified: Secondary | ICD-10-CM

## 2020-07-21 NOTE — Patient Instructions (Addendum)
F/U as before, call  If you need me sooner  You are referred for mammogram and  Ultrasound of left breast we will call with appointment information  Be careful not to fall  It is important that you exercise regularly at least 30 minutes 5 times a week. If you develop chest pain, have severe difficulty breathing, or feel very tired, stop exercising immediately and seek medical attention   Thanks for choosing [facility], we consider it a privelige to serve you.

## 2020-07-25 ENCOUNTER — Encounter: Payer: Self-pay | Admitting: Family Medicine

## 2020-07-25 NOTE — Assessment & Plan Note (Signed)
Controlled, no change in medication  

## 2020-07-25 NOTE — Assessment & Plan Note (Signed)
New left mastalgia, inaging studies indicated, diagnostic mammogram and [place]

## 2020-07-25 NOTE — Progress Notes (Signed)
   Andrea Santiago     MRN: [id number]      DOB: 01/05/1941   HPI Andrea Santiago is here   With unprovoked left breast soreness and discomfort, no trauma, first episode. Denies nipple discharge. No redness of skin, no palpable breast lump  ROS Denies recent fever or chills. Denies sinus pressure, nasal congestion, ear pain or sore throat. Denies chest congestion, productive cough or wheezing. Denies chest pains, palpitations and leg swelling Denies abdominal pain, nausea, vomiting,diarrhea or constipation.   Denies dysuria, frequency, hesitancy or incontinence. Denies joint pain, swelling and limitation in mobility. Denies headaches, seizures, numbness, or tingling. Denies depression, anxiety or insomnia. Denies skin break down or rash.   PE  BP 121/69   Pulse 87   Resp 16   Ht 5\' 4"  (1.626 m)   Wt 163 lb (73.9 kg)   SpO2 95%   BMI 27.98 kg/m   Patient alert and oriented and in no cardiopulmonary distress.  HEENT: No facial asymmetry, EOMI,     Neck supple .  Chest: Clear to auscultation bilaterally. Breast: bilateral mastectomy, No erythema or warmth of left breast. Tender inner lower qudrant left breast. No mass, No nipple d/c , no axillary or supraclavicular adenopathy  CVS: S1, S2 no murmurs, no S3.Regular rate.  ABD: Soft non tender.   Ext: No edema     Assessment & Plan  Breast pain, left  New left mastalgia, inaging studies indicated, diagnostic mammogram and [place]  Essential hypertension Controlled, no change in medication DASH diet and commitment to daily physical activity for a minimum of 30 minutes discussed and encouraged, as a part of hypertension management. The importance of attaining a healthy weight is also discussed.  BP/Weight 07/21/2020 06/10/2020 02/25/2020 12/01/2019 10/02/2019 2/75/1700 02/26/4942  Systolic BP 967 591 638 466 599 357 017  Diastolic BP 69 80 71 78 72 72 68  Wt. (Lbs) 163 167 159.8 162.2 159 159 161.2  BMI 27.98 28.67 27.43 27.84 27.29  27.29 27.67       Depression, major, single episode, severe (HCC) Controlled, no change in medication   Hypothyroid Controlled, no change in medication

## 2020-07-25 NOTE — Assessment & Plan Note (Signed)
Controlled, no change in medication DASH diet and commitment to daily physical activity for a minimum of 30 minutes discussed and encouraged, as a part of hypertension management. The importance of attaining a healthy weight is also discussed.  BP/Weight 07/21/2020 06/10/2020 02/25/2020 12/01/2019 10/02/2019 7/34/0370 10/26/4381  Systolic BP 818 403 754 360 677 034 035  Diastolic BP 69 80 71 78 72 72 68  Wt. (Lbs) 163 167 159.8 162.2 159 159 161.2  BMI 27.98 28.67 27.43 27.84 27.29 27.29 27.67
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
## 2020-07-26 ENCOUNTER — Ambulatory Visit: Payer: Medicare HMO | Admitting: Family Medicine

## 2020-07-26 DIAGNOSIS — E782 Mixed hyperlipidemia: Secondary | ICD-10-CM

## 2020-07-26 DIAGNOSIS — I350 Nonrheumatic aortic (valve) stenosis: Secondary | ICD-10-CM

## 2020-07-26 DIAGNOSIS — I10 Essential (primary) hypertension: Secondary | ICD-10-CM

## 2020-07-27 ENCOUNTER — Telehealth: Payer: Self-pay | Admitting: Family Medicine

## 2020-07-27 DIAGNOSIS — I350 Nonrheumatic aortic (valve) stenosis: Secondary | ICD-10-CM

## 2020-07-27 NOTE — Telephone Encounter (Signed)
-----   Message from Verta Ellen., NP sent at 07/14/2020  5:34 PM EDT ----- Please call the patient and let her know the echocardiogram showed she has good pumping function of her heart.  The main pumping chamber is a little stiff and has problems relaxing when trying to feel.  Best management is to maintain blood pressures at or below 130/80 and manage all other risk factors.  She has a mildly leaking mitral valve.  Aortic valve stenosis is still classified as moderate to severe.  She was asymptomatic at last visit so we will need to do another echocardiogram in 6 months to recheck the aortic valve.  Please schedule a follow-up echocardiogram in 6 months to recheck aortic valve stenosis

## 2020-07-27 NOTE — Telephone Encounter (Signed)
Laurine Blazer, LPN  05/26/477 9:87 PM EDT Back to Top     Notified, copy to pcp.    Laurine Blazer, LPN  03/23/5870 7:61 PM EDT      Left message to return call with contact number (friend) - she will try to reach her.    Laurine Blazer, LPN  8/48/5927 6:39 PM EDT      Left message to return call x 2.    Laurine Blazer, LPN  4/32/0037 9:44 PM EDT      Left message to return call.

## 2020-07-27 NOTE — Telephone Encounter (Signed)
Left message to return call 

## 2020-07-27 NOTE — Telephone Encounter (Signed)
Patient returned Gayle's call but she did not know her name. She would like call back [phone].
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
## 2020-08-18 ENCOUNTER — Other Ambulatory Visit: Payer: Self-pay | Admitting: Family Medicine

## 2020-08-24 ENCOUNTER — Other Ambulatory Visit: Payer: Self-pay

## 2020-08-24 ENCOUNTER — Ambulatory Visit (HOSPITAL_COMMUNITY)
Admission: RE | Admit: 2020-08-24 | Discharge: 2020-08-24 | Disposition: A | Discharge status: Home / Self Care | Payer: Medicare HMO | Source: Ambulatory Visit | Attending: Family Medicine | Admitting: Family Medicine

## 2020-08-24 DIAGNOSIS — N644 Mastodynia: Secondary | ICD-10-CM

## 2020-08-25 ENCOUNTER — Encounter: Payer: Self-pay | Admitting: Rheumatology

## 2020-08-25 ENCOUNTER — Ambulatory Visit (INDEPENDENT_AMBULATORY_CARE_PROVIDER_SITE_OTHER): Payer: Medicare HMO | Admitting: Rheumatology

## 2020-08-25 VITALS — BP 150/71 | HR 81 | Resp 15 | Ht 64.0 in | Wt 169.8 lb

## 2020-08-25 DIAGNOSIS — M7061 Trochanteric bursitis, right hip: Secondary | ICD-10-CM | POA: Diagnosis not present

## 2020-08-25 DIAGNOSIS — M19041 Primary osteoarthritis, right hand: Secondary | ICD-10-CM | POA: Diagnosis not present

## 2020-08-25 DIAGNOSIS — M62838 Other muscle spasm: Secondary | ICD-10-CM

## 2020-08-25 DIAGNOSIS — E559 Vitamin D deficiency, unspecified: Secondary | ICD-10-CM

## 2020-08-25 DIAGNOSIS — J449 Chronic obstructive pulmonary disease, unspecified: Secondary | ICD-10-CM | POA: Diagnosis not present

## 2020-08-25 DIAGNOSIS — M170 Bilateral primary osteoarthritis of knee: Secondary | ICD-10-CM | POA: Diagnosis not present

## 2020-08-25 DIAGNOSIS — M5136 Other intervertebral disc degeneration, lumbar region: Secondary | ICD-10-CM | POA: Diagnosis not present

## 2020-08-25 DIAGNOSIS — M797 Fibromyalgia: Secondary | ICD-10-CM | POA: Diagnosis not present

## 2020-08-25 DIAGNOSIS — M7062 Trochanteric bursitis, left hip: Secondary | ICD-10-CM

## 2020-08-25 DIAGNOSIS — M19071 Primary osteoarthritis, right ankle and foot: Secondary | ICD-10-CM

## 2020-08-25 DIAGNOSIS — Z8639 Personal history of other endocrine, nutritional and metabolic disease: Secondary | ICD-10-CM

## 2020-08-25 DIAGNOSIS — R5383 Other fatigue: Secondary | ICD-10-CM | POA: Diagnosis not present

## 2020-08-25 DIAGNOSIS — M19042 Primary osteoarthritis, left hand: Secondary | ICD-10-CM

## 2020-08-25 DIAGNOSIS — Z8679 Personal history of other diseases of the circulatory system: Secondary | ICD-10-CM

## 2020-08-25 DIAGNOSIS — M19072 Primary osteoarthritis, left ankle and foot: Secondary | ICD-10-CM

## 2020-08-25 DIAGNOSIS — M8589 Other specified disorders of bone density and structure, multiple sites: Secondary | ICD-10-CM

## 2020-08-25 DIAGNOSIS — M542 Cervicalgia: Secondary | ICD-10-CM | POA: Diagnosis not present

## 2020-08-25 DIAGNOSIS — Z8659 Personal history of other mental and behavioral disorders: Secondary | ICD-10-CM

## 2020-08-25 NOTE — Patient Instructions (Signed)
Cervical Strain and Sprain Rehab Ask your health care provider which exercises are safe for you. Do exercises exactly as told by your health care provider and adjust them as directed. It is normal to feel mild stretching, pulling, tightness, or discomfort as you do these exercises. Stop right away if you feel sudden pain or your pain gets worse. Do not begin these exercises until told by your health care provider. Stretching and range-of-motion exercises Cervical side bending  Using good posture, sit on a stable chair or stand up. Without moving your shoulders, slowly tilt your left / right ear to your shoulder until you feel a stretch in the opposite side neck muscles. You should be looking straight ahead. Hold for __________ seconds. Repeat with the other side of your neck. Repeat __________ times. Complete this exercise __________ times a day. Cervical rotation  Using good posture, sit on a stable chair or stand up. Slowly turn your head to the side as if you are looking over your left / right shoulder. Keep your eyes level with the ground. Stop when you feel a stretch along the side and the back of your neck. Hold for __________ seconds. Repeat this by turning to your other side. Repeat __________ times. Complete this exercise __________ times a day. Thoracic extension and pectoral stretch Roll a towel or a small blanket so it is about 4 inches (10 cm) in diameter. Lie down on your back on a firm surface. Put the towel lengthwise, under your spine in the middle of your back. It should not be under your shoulder blades. The towel should line up with your spine from your middle back to your lower back. Put your hands behind your head and let your elbows fall out to your sides. Hold for __________ seconds. Repeat __________ times. Complete this exercise __________ times a day. Strengthening exercises Isometric upper cervical flexion Lie on your back with a thin pillow behind your head  and a small rolled-up towel under your neck. Gently tuck your chin toward your chest and nod your head down to look toward your feet. Do not lift your head off the pillow. Hold for __________ seconds. Release the tension slowly. Relax your neck muscles completely before you repeat this exercise. Repeat __________ times. Complete this exercise __________ times a day. Isometric cervical extension  Stand about 6 inches (15 cm) away from a wall, with your back facing the wall. Place a soft object, about 6-8 inches (15-20 cm) in diameter, between the back of your head and the wall. A soft object could be a small pillow, a ball, or a folded towel. Gently tilt your head back and press into the soft object. Keep your jaw and forehead relaxed. Hold for __________ seconds. Release the tension slowly. Relax your neck muscles completely before you repeat this exercise. Repeat __________ times. Complete this exercise __________ times a day. Posture and body mechanics Body mechanics refers to the movements and positions of your body while you do your daily activities. Posture is part of body mechanics. Good posture and healthy body mechanics can help to relieve stress in your body's tissues and joints. Good posture means that your spine is in its natural S-curve position (your spine is neutral), your shoulders are pulled back slightly, and your head is not tipped forward. The following are general guidelines for applying improved posture andbody mechanics to your everyday activities. Sitting  When sitting, keep your spine neutral and keep your feet flat on the floor. Use   a footrest, if necessary, and keep your thighs parallel to the floor. Avoid rounding your shoulders, and avoid tilting your head forward. When working at a desk or a computer, keep your desk at a height where your hands are slightly lower than your elbows. Slide your chair under your desk so you are close enough to maintain good posture. When  working at a computer, place your monitor at a height where you are looking straight ahead and you do not have to tilt your head forward or downward to look at the screen.  Standing  When standing, keep your spine neutral and keep your feet about hip-width apart. Keep a slight bend in your knees. Your ears, shoulders, and hips should line up. When you do a task in which you stand in one place for a long time, place one foot up on a stable object that is 2-4 inches (5-10 cm) high, such as a footstool. This helps keep your spine neutral.  Resting When lying down and resting, avoid positions that are most painful for you. Try to support your neck in a neutral position. You can use a contour pillow or asmall rolled-up towel. Your pillow should support your neck but not push on it. This information is not intended to replace advice given to you by your health care provider. Make sure you discuss any questions you have with your healthcare provider. Document Revised: 05/29/2018 Document Reviewed: 11/07/2017 [employer] Patient Education  [place] Exercises Hand exercises can be helpful for almost anyone. These exercises can strengthen the hands, improve flexibility and movement, and increase blood flow to the hands. These results can make work and daily tasks easier. Hand exercises can be especially helpful for people who have joint pain from arthritis or have nerve damage from overuse (carpal tunnel syndrome). These exercises can also help people who have injured a hand. Exercises Most of these hand exercises are gentle stretching and motion exercises. It is usually safe to do them often throughout the day. Warming up your hands before exercise may help to reduce stiffness. You can do this with gentle massage orby placing your hands in warm water for 10-15 minutes. It is normal to feel some stretching, pulling, tightness, or mild discomfort as you begin new exercises. This will gradually  improve. Stop an exercise right away if you feel sudden, severe pain or your pain gets worse. Ask your healthcare provider which exercises are best for you. Knuckle bend or "claw" fist Stand or sit with your arm, hand, and all five fingers pointed straight up. Make sure to keep your wrist straight during the exercise. Gently bend your fingers down toward your palm until the tips of your fingers are touching the top of your palm. Keep your big knuckle straight and just bend the small knuckles in your fingers. Hold this position for __________ seconds. Straighten (extend) your fingers back to the starting position. Repeat this exercise 5-10 times with each hand. Full finger fist Stand or sit with your arm, hand, and all five fingers pointed straight up. Make sure to keep your wrist straight during the exercise. Gently bend your fingers into your palm until the tips of your fingers are touching the middle of your palm. Hold this position for __________ seconds. Extend your fingers back to the starting position, stretching every joint fully. Repeat this exercise 5-10 times with each hand. Straight fist Stand or sit with your arm, hand, and all five fingers pointed straight up. Make sure  to keep your wrist straight during the exercise. Gently bend your fingers at the big knuckle, where your fingers meet your hand, and the middle knuckle. Keep the knuckle at the tips of your fingers straight and try to touch the bottom of your palm. Hold this position for __________ seconds. Extend your fingers back to the starting position, stretching every joint fully. Repeat this exercise 5-10 times with each hand. Tabletop Stand or sit with your arm, hand, and all five fingers pointed straight up. Make sure to keep your wrist straight during the exercise. Gently bend your fingers at the big knuckle, where your fingers meet your hand, as far down as you can while keeping the small knuckles in your fingers straight.  Think of forming a tabletop with your fingers. Hold this position for __________ seconds. Extend your fingers back to the starting position, stretching every joint fully. Repeat this exercise 5-10 times with each hand. Finger spread Place your hand flat on a table with your palm facing down. Make sure your wrist stays straight as you do this exercise. Spread your fingers and thumb apart from each other as far as you can until you feel a gentle stretch. Hold this position for __________ seconds. Bring your fingers and thumb tight together again. Hold this position for __________ seconds. Repeat this exercise 5-10 times with each hand. Making circles Stand or sit with your arm, hand, and all five fingers pointed straight up. Make sure to keep your wrist straight during the exercise. Make a circle by touching the tip of your thumb to the tip of your index finger. Hold for __________ seconds. Then open your hand wide. Repeat this motion with your thumb and each finger on your hand. Repeat this exercise 5-10 times with each hand. Thumb motion Sit with your forearm resting on a table and your wrist straight. Your thumb should be facing up toward the ceiling. Keep your fingers relaxed as you move your thumb. Lift your thumb up as high as you can toward the ceiling. Hold for __________ seconds. Bend your thumb across your palm as far as you can, reaching the tip of your thumb for the small finger (pinkie) side of your palm. Hold for __________ seconds. Repeat this exercise 5-10 times with each hand. Grip strengthening  Hold a stress ball or other soft ball in the middle of your hand. Slowly increase the pressure, squeezing the ball as much as you can without causing pain. Think of bringing the tips of your fingers into the middle of your palm. All of your finger joints should bend when doing this exercise. Hold your squeeze for __________ seconds, then relax. Repeat this exercise 5-10 times with  each hand. Contact a health care provider if: Your hand pain or discomfort gets much worse when you do an exercise. Your hand pain or discomfort does not improve within 2 hours after you exercise. If you have any of these problems, stop doing these exercises right away. Do not do them again unless your health care provider says that you can. Get help right away if: You develop sudden, severe hand pain or swelling. If this happens, stop doing these exercises right away. Do not do them again unless your health care provider says that you can. This information is not intended to replace advice given to you by your health care provider. Make sure you discuss any questions you have with your healthcare provider. Document Revised: 05/30/2018 Document Reviewed: 02/07/2018 [employer] Patient Education  2022  [employer].

## 2020-09-02 ENCOUNTER — Other Ambulatory Visit: Payer: Self-pay | Admitting: Family Medicine

## 2020-09-10 ENCOUNTER — Other Ambulatory Visit: Payer: Self-pay | Admitting: Family Medicine

## 2020-10-05 ENCOUNTER — Encounter: Payer: Medicare HMO | Admitting: Family Medicine

## 2020-10-05 ENCOUNTER — Other Ambulatory Visit: Payer: Self-pay

## 2020-10-05 ENCOUNTER — Ambulatory Visit (INDEPENDENT_AMBULATORY_CARE_PROVIDER_SITE_OTHER): Payer: Medicare HMO

## 2020-10-05 DIAGNOSIS — Z0000 Encounter for general adult medical examination without abnormal findings: Secondary | ICD-10-CM | POA: Diagnosis not present
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
## 2020-10-05 NOTE — Patient Instructions (Signed)
Health Maintenance, Female Adopting a healthy lifestyle and getting preventive care are important in promoting health and wellness. Ask your health care provider about: The right schedule for you to have regular tests and exams. Things you can do on your own to prevent diseases and keep yourself healthy. What should I know about diet, weight, and exercise? Eat a healthy diet  Eat a diet that includes plenty of vegetables, fruits, low-fat dairy products, and lean protein. Do not eat a lot of foods that are high in solid fats, added sugars, or sodium.  Maintain a healthy weight Body mass index (BMI) is used to identify weight problems. It estimates body fat based on height and weight. Your health care provider can help determineyour BMI and help you achieve or maintain a healthy weight. Get regular exercise Get regular exercise. This is one of the most important things you can do for your health. Most adults should: Exercise for at least 150 minutes each week. The exercise should increase your heart rate and make you sweat (moderate-intensity exercise). Do strengthening exercises at least twice a week. This is in addition to the moderate-intensity exercise. Spend less time sitting. Even light physical activity can be beneficial. Watch cholesterol and blood lipids Have your blood tested for lipids and cholesterol at 82 years of age, then havethis test every 5 years. Have your cholesterol levels checked more often if: Your lipid or cholesterol levels are high. You are older than 82 years of age. You are at high risk for heart disease. What should I know about cancer screening? Depending on your health history and family history, you may need to have cancer screening at various ages. This may include screening for: Breast cancer. Cervical cancer. Colorectal cancer. Skin cancer. Lung cancer. What should I know about heart disease, diabetes, and high blood pressure? Blood pressure and heart  disease High blood pressure causes heart disease and increases the risk of stroke. This is more likely to develop in people who have high blood pressure readings, are of African descent, or are overweight. Have your blood pressure checked: Every 3-5 years if you are 18-39 years of age. Every year if you are 40 years old or older. Diabetes Have regular diabetes screenings. This checks your fasting blood sugar level. Have the screening done: Once every three years after age 40 if you are at a normal weight and have a low risk for diabetes. More often and at a younger age if you are overweight or have a high risk for diabetes. What should I know about preventing infection? Hepatitis B If you have a higher risk for hepatitis B, you should be screened for this virus. Talk with your health care provider to find out if you are at risk forhepatitis B infection. Hepatitis C Testing is recommended for: Everyone born from 1945 through 1965. Anyone with known risk factors for hepatitis C. Sexually transmitted infections (STIs) Get screened for STIs, including gonorrhea and chlamydia, if: You are sexually active and are younger than 82 years of age. You are older than 82 years of age and your health care provider tells you that you are at risk for this type of infection. Your sexual activity has changed since you were last screened, and you are at increased risk for chlamydia or gonorrhea. Ask your health care provider if you are at risk. Ask your health care provider about whether you are at high risk for HIV. Your health care provider may recommend a prescription medicine to help   prevent HIV infection. If you choose to take medicine to prevent HIV, you should first get tested for HIV. You should then be tested every 3 months for as long as you are taking the medicine. Pregnancy If you are about to stop having your period (premenopausal) and you may become pregnant, seek counseling before you get  pregnant. Take 400 to 800 micrograms (mcg) of folic acid every day if you become pregnant. Ask for birth control (contraception) if you want to prevent pregnancy. Osteoporosis and menopause Osteoporosis is a disease in which the bones lose minerals and strength with aging. This can result in bone fractures. If you are 65 years old or older, or if you are at risk for osteoporosis and fractures, ask your health care provider if you should: Be screened for bone loss. Take a calcium or vitamin D supplement to lower your risk of fractures. Be given hormone replacement therapy (HRT) to treat symptoms of menopause. Follow these instructions at home: Lifestyle Do not use any products that contain nicotine or tobacco, such as cigarettes, e-cigarettes, and chewing tobacco. If you need help quitting, ask your health care provider. Do not use street drugs. Do not share needles. Ask your health care provider for help if you need support or information about quitting drugs. Alcohol use Do not drink alcohol if: Your health care provider tells you not to drink. You are pregnant, may be pregnant, or are planning to become pregnant. If you drink alcohol: Limit how much you use to 0-1 drink a day. Limit intake if you are breastfeeding. Be aware of how much alcohol is in your drink. In the [place] one drink equals one 12 oz bottle of beer (355 mL), one 5 oz glass of wine (148 mL), or one 1 oz glass of hard liquor (44 mL). General instructions Schedule regular health, dental, and eye exams. Stay current with your vaccines. Tell your health care provider if: You often feel depressed. You have ever been abused or do not feel safe at home. Summary Adopting a healthy lifestyle and getting preventive care are important in promoting health and wellness. Follow your health care provider's instructions about healthy diet, exercising, and getting tested or screened for diseases. Follow your health care provider's  instructions on monitoring your cholesterol and blood pressure. This information is not intended to replace advice given to you by your health care provider. Make sure you discuss any questions you have with your healthcare provider. Document Revised: 01/30/2018 Document Reviewed: 01/30/2018 Elsevier Patient Education  2022 [employer]  

## 2020-10-11 ENCOUNTER — Telehealth: Payer: Self-pay | Admitting: *Deleted

## 2020-10-11 NOTE — Chronic Care Management (AMB) (Signed)
  Chronic Care Management   Note  10/11/2020 Name: Andrea Santiago MRN: [id number] DOB: February 18, 1941  Andrea Santiago is a 80 y.o. year old female who is a primary care patient of Fayrene Helper, MD. I reached out to Tennis Must by phone today in response to a referral sent by Ms. Terese Door Shurley's PCP, Fayrene Helper, MD.      [name] was given information about Chronic Care Management services today including:  CCM service includes personalized support from designated clinical staff supervised by her physician, including individualized plan of care and coordination with other care providers 24/7 contact phone numbers for assistance for urgent and routine care needs. Service will only be billed when office clinical staff spend 20 minutes or more in a month to coordinate care. Only one practitioner may furnish and bill the service in a calendar month. The patient may stop CCM services at any time (effective at the end of the month) by phone call to the office staff. The patient will be responsible for cost sharing (co-pay) of up to 20% of the service fee (after annual deductible is met).  Patient agreed to services and verbal consent obtained.   Follow up plan: Telephone appointment with care management team member scheduled for:10/20/20  [facility]  Direct Dial: [phone]

## 2020-10-20 ENCOUNTER — Ambulatory Visit (INDEPENDENT_AMBULATORY_CARE_PROVIDER_SITE_OTHER): Payer: Medicare HMO | Admitting: Pharmacist

## 2020-10-20 DIAGNOSIS — I25119 Atherosclerotic heart disease of native coronary artery with unspecified angina pectoris: Secondary | ICD-10-CM

## 2020-10-20 DIAGNOSIS — R7303 Prediabetes: Secondary | ICD-10-CM

## 2020-10-20 DIAGNOSIS — I10 Essential (primary) hypertension: Secondary | ICD-10-CM | POA: Diagnosis not present

## 2020-10-20 DIAGNOSIS — M8589 Other specified disorders of bone density and structure, multiple sites: Secondary | ICD-10-CM

## 2020-10-20 DIAGNOSIS — E7849 Other hyperlipidemia: Secondary | ICD-10-CM | POA: Diagnosis not present

## 2020-10-20 MED ORDER — ROSUVASTATIN CALCIUM 10 MG PO TABS: 10.0000 mg | ORAL_TABLET | Freq: Every day | ORAL | 2 refills | Status: DC

## 2020-10-20 NOTE — Chronic Care Management (AMB) (Signed)
  Chronic Care Management Pharmacy Note  10/20/2020 Name:  Andrea Santiago MRN:  [id number] DOB:  04/10/1940  Recommendations made from today's visit: Discussed hyperlipidemia with PCP and will add rosuvastatin 10 mg by mouth once daily. Patient willing to try a statin again despite issues a long time ago. Will plan to titrate if necessary.  Recommend 1,000 mg of elemental calcium per day from diet or supplementation. Supplemental calcium not necessary if appropriate amount obtained through diet. Recommend 2,000 IU of vitamin D supplementation per day to achieve adequate vitamin D levels  Subjective: Andrea Santiago is an 80 y.o. year old female who is a primary patient of Simpson, Margaret E, MD.  The CCM team was consulted for assistance with disease management and care coordination needs.    Engaged with patient by telephone for initial visit in response to provider referral for pharmacy case management and/or care coordination services.   Consent to Services:  The patient was given the following information about Chronic Care Management services today, agreed to services, and gave verbal consent: 1. CCM service includes personalized support from designated clinical staff supervised by the primary care provider, including individualized plan of care and coordination with other care providers 2. 24/7 contact phone numbers for assistance for urgent and routine care needs. 3. Service will only be billed when office clinical staff spend 20 minutes or more in a month to coordinate care. 4. Only one practitioner may furnish and bill the service in a calendar month. 5.The patient may stop CCM services at any time (effective at the end of the month) by phone call to the office staff. 6. The patient will be responsible for cost sharing (co-pay) of up to 20% of the service fee (after annual deductible is met). Patient agreed to services and consent obtained.  Patient Care Team: Simpson, Margaret E, MD as  PCP - General Hawkins, Edward, MD as Consulting Physician (Pulmonary Disease) Deveshwar, Shaili, MD as Consulting Physician (Rheumatology) Fields, Sandi L, MD (Inactive) as Consulting Physician (Gastroenterology) Walston, Christopher J, RPH (Pharmacist)  Objective:  Lab Results  Component Value Date   CREATININE 0.78 06/09/2020   CREATININE 0.87 09/11/2019   CREATININE 0.77 04/08/2019    Lab Results  Component Value Date   HGBA1C 6.1 (H) 04/08/2019   Last diabetic Eye exam: No results found for: HMDIABEYEEXA  Last diabetic Foot exam: No results found for: HMDIABFOOTEX      Component Value Date/Time   CHOL 235 (H) 06/09/2020 1338   TRIG 260 (H) 06/09/2020 1338   HDL 40 06/09/2020 1338   CHOLHDL 5.9 (H) 06/09/2020 1338   CHOLHDL 4.0 04/08/2019 1059   VLDL 35 (H) 09/23/2016 1108   LDLCALC 147 (H) 06/09/2020 1338   LDLCALC 116 (H) 04/08/2019 1059   LDLDIRECT 141 (H) 10/31/2007 1021    Hepatic Function Latest Ref Rng & Units 06/09/2020 09/11/2019 04/08/2019  Total Protein 6.0 - 8.5 g/dL 7.6 7.4 7.3  Albumin 3.6 - 4.6 g/dL 4.7(H) 4.4 -  AST 0 - 40 IU/L 29 25 21  ALT 0 - 32 IU/L 21 20 17  Alk Phosphatase 44 - 121 IU/L 80 77 -  Total Bilirubin 0.0 - 1.2 mg/dL 0.3 0.4 0.4  Bilirubin, Direct 0.0 - 0.2 mg/dL - - -    Lab Results  Component Value Date/Time   TSH 2.390 06/09/2020 01:38 PM   TSH 0.85 05/09/2019 01:18 PM   FREET4 1.2 05/09/2019 01:18 PM   FREET4 1.3 04/29/2018 10:17 AM      CBC Latest Ref Rng & Units 09/11/2019 04/29/2018 05/05/2017  WBC 3.4 - 10.8 x10E3/uL 7.1 7.0 7.7  Hemoglobin 11.1 - 15.9 g/dL 14.1 13.9 13.2  Hematocrit 34.0 - 46.6 % 40.8 41.1 38.0  Platelets 150 - 450 x10E3/uL 268 345 254    Lab Results  Component Value Date/Time   VD25OH 17.7 (L) 06/09/2020 01:38 PM   VD25OH 20.0 (L) 09/11/2019 11:32 AM    Clinical ASCVD: No  The ASCVD Risk score (Goff DC Jr., et al., 2013) failed to calculate for the following reasons:   The 2013 ASCVD risk score is  only valid for ages 40 to 79    Social History   Tobacco Use  Smoking Status Former   Packs/day: 0.50   Years: 1.00   Pack years: 0.50   Types: Cigarettes   Start date: 09/30/1961   Quit date: 10/01/1962   Years since quitting: 58.0  Smokeless Tobacco Never  Tobacco Comments   smoked only 1 year in her whole life   BP Readings from Last 3 Encounters:  08/25/20 (!) 150/71  07/21/20 121/69  06/10/20 120/80   Pulse Readings from Last 3 Encounters:  08/25/20 81  07/21/20 87  06/10/20 68   Wt Readings from Last 3 Encounters:  08/25/20 169 lb 12.8 oz (77 kg)  07/21/20 163 lb (73.9 kg)  06/10/20 167 lb (75.8 kg)    Assessment: Review of patient past medical history, allergies, medications, health status, including review of consultants reports, laboratory and other test data, was performed as part of comprehensive evaluation and provision of chronic care management services.   SDOH:  (Social Determinants of Health) assessments and interventions performed:    CCM Care Plan  Allergies  Allergen Reactions   Statins Other (See Comments)    Leg Pain    Medications Reviewed Today     Reviewed by Walston, Christopher J, RPH (Pharmacist) on 10/20/20 at 1522  Med List Status: <None>   Medication Order Taking? Sig Documenting Provider Last Dose Status Informant  albuterol (VENTOLIN HFA) 108 (90 Base) MCG/ACT inhaler [id number] Yes Inhale 1 puff into the lungs every 6 (six) hours as needed for wheezing or shortness of breath. [provider] Taking Active   amLODipine (NORVASC) 2.5 MG tablet [id number] Yes Take 1 tablet (2.5 mg total) by mouth daily. Simpson, Margaret E, MD Taking Active   azelastine (ASTELIN) 0.1 % nasal spray [id number] No Use 2 sprays in each nostril every 12 hours for 1 week; After that, you may use 1 spray in each nostril twice a day as needed for allergies/congestion  Patient not taking: Reported on 10/20/2020   Simpson, Margaret E, MD Not Taking Active    budesonide-formoterol (SYMBICORT) 160-4.5 MCG/ACT inhaler [id number] Yes Inhale 2 puffs into the lungs 2 (two) times daily. Simpson, Margaret E, MD Taking Active   cyclobenzaprine (FLEXERIL) 5 MG tablet [id number] Yes TAKE 1 TABLET BY MOUTH EVERYDAY AT BEDTIME Simpson, Margaret E, MD Taking Active   diclofenac Sodium (VOLTAREN) 1 % GEL [id number] Yes APPLY 2-4 GRAMS TO AFFECTED JOINT 4 TIMES DAILY AS NEEDED. Simpson, Margaret E, MD Taking Active            Med Note (WALSTON, CHRISTOPHER J   Wed Oct 20, 2020  3:19 PM) Only using at bedtime  DULoxetine (CYMBALTA) 60 MG capsule [id number] Yes TAKE 1 CAPSULE BY MOUTH 2 TIMES DAILY.  Patient taking differently: Take 60 mg by mouth at bedtime.   Simpson, Margaret E, MD Taking Active     ezetimibe (ZETIA) 10 MG tablet [id number] Yes Take 1 tablet (10 mg total) by mouth daily. Fayrene Helper, MD Taking Active   fluticasone [facility]) 50 MCG/ACT nasal spray [id number] Yes Use 2 sprays in each nostril BID for a week. After 1 week, decrease to 1 spray in each nostril BID as needed for congestion/allergies. Fayrene Helper, MD Taking Active   Ginger, Zingiber officinalis, (GINGER PO) [id number] Yes Take by mouth. [provider] Taking Active   hydrochlorothiazide (HYDRODIURIL) 25 MG tablet [id number] Yes Take 1 tablet (25 mg total) by mouth daily. Fayrene Helper, MD Taking Active   levothyroxine (SYNTHROID) 88 MCG tablet [id number] Yes Take 1 tablet (88 mcg total) by mouth daily before breakfast. Fayrene Helper, MD Taking Active   lubiprostone (AMITIZA) 24 MCG capsule [id number] No TAKE 1 CAPSULE BY MOUTH 2 TIMES DAILY WITH A MEAL.  Patient not taking: No sig reported   Fayrene Helper, MD Not Taking Active   meclizine (ANTIVERT) 25 MG tablet [id number] No Take 1 tablet (25 mg total) by mouth 3 (three) times daily as needed for dizziness.  Patient not taking: Reported on 10/20/2020   Fayrene Helper, MD Not Taking Active   niacin  (NIASPAN) 1000 MG CR tablet [id number] No TAKE 2 TABLETS (2,000 MG TOTAL) BY MOUTH AT BEDTIME.  Patient not taking: Reported on 10/20/2020   Fayrene Helper, MD Unknown Active   ondansetron [facility] ODT) 8 MG disintegrating tablet [id number] No Take 1 tablet (8 mg total) by mouth every 8 (eight) hours as needed for nausea or vomiting.  Patient not taking: Reported on 10/20/2020   Fayrene Helper, MD Not Taking Active   potassium chloride (KLOR-CON M10) 10 MEQ tablet [id number] Yes TAKE 3 TABLETS (30 MEQ TOTAL) BY MOUTH DAILY. Fayrene Helper, MD Taking Active   Probiotic Product (PROBIOTIC PO) [id number] Yes Take by mouth daily. [provider] Taking Active   TURMERIC PO [id number] Yes Take by mouth daily. [provider] Taking Active             Patient Active Problem List   Diagnosis Date Noted   Breast pain, left 07/21/2020   Shoulder pain 09/09/2019   Chronic migraine without aura without status migrainosus, not intractable 04/20/2019   Heart murmur, systolic 21/19/4174   Constipation 11/12/2017   Chronic right SI joint pain 09/11/2017   Hip pain, chronic, right 09/11/2017   Posterior chest pain 09/11/2017   Depression, major, single episode, severe ([place]) 05/05/2017   Essential hypertension 05/05/2017   Osteopenia of multiple sites 05/29/2016   Vitamin D deficiency 05/25/2016   Primary osteoarthritis of both hands 05/11/2016   Primary osteoarthritis of both feet 05/11/2016   DJD (degenerative joint disease), cervical 05/11/2016   Spondylosis of lumbar region without myelopathy or radiculopathy 05/11/2016   Primary osteoarthritis of both knees 05/11/2016   Headache disorder 04/06/2016   Fibromyalgia 12/18/2015   Hypothyroidism 11/23/2015   Lumbar stenosis with neurogenic claudication 06/21/2015   At high risk for falls 03/28/2015   Multinodular goiter 03/30/2014   CAD (coronary atherosclerotic disease) 03/12/2013   Allergic rhinitis 06/12/2011    Hypothyroid 01/30/2011   Prediabetes 10/26/2009   Overweight 11/22/2008   Low back pain with left-sided sciatica 03/24/2008   Hyperlipemia 03/06/2006   Hypertension 03/06/2006   GERD 03/06/2006   Myalgia and myositis 03/06/2006   Osteoporosis 03/06/2006    Immunization History  Administered Date(s) Administered   Fluad Quad(high Dose 65+) 11/03/2019   H1N1  02/05/2008   Influenza Split 11/21/2013   Influenza Whole 11/19/2008, 10/26/2009, 11/01/2010   Influenza, High Dose Seasonal PF 01/14/2018   Influenza, Quadrivalent, Recombinant, Inj, Pf 10/30/2018   Influenza,inj,Quad PF,6+ Mos 11/20/2012, 11/02/2014, 12/13/2015, 10/16/2016   Influenza-Unspecified 03/31/2019, 11/03/2019   Moderna Sars-Covid-2 Vaccination 03/23/2020   PFIZER(Purple Top)SARS-COV-2 Vaccination 05/15/2019, 06/07/2019   Pneumococcal Conjugate-13 03/30/2014   Pneumococcal Polysaccharide-23 07/08/2009   Td 10/26/2009   Zoster Recombinat (Shingrix) 01/23/2018   Zoster, Live 01/30/2011    Conditions to be addressed/monitored: CAD, HTN, HLD, pre-diabetes, and osteopenia  Care Plan : Medication Management  Updates made by Walston, Christopher J, RPH since 10/20/2020 12:00 AM     Problem: HTN, CAD/HLD, Osteopenia, Pre-diabetes   Priority: High  Onset Date: 10/20/2020     Long-Range Goal: Disease Progression Prevention   Start Date: 10/20/2020  Expected End Date: 01/18/2021  This Visit's Progress: On track  Priority: High  Note:   Current Barriers:  Unable to achieve control of hyperlipidemia Suboptimal therapeutic regimen for hyperlipidemia and osteopenia  Pharmacist Clinical Goal(s):  Over the next 90  days, patient will Achieve control of hyperlipidemia as evidenced by improved LDL and improved triglycerides Adhere to plan to optimize therapeutic regimen for hyperlipidemia and osteopenia as evidenced by report of adherence to recommended medication management changes through collaboration with PharmD and  provider.   Interventions: 1:1 collaboration with Simpson, Margaret E, MD regarding development and update of comprehensive plan of care as evidenced by provider attestation and co-signature Inter-disciplinary care team collaboration (see longitudinal plan of care) Comprehensive medication review performed; medication list updated in electronic medical record  Pre-diabetes: Current medications:  none Intolerances: none Taking medications as directed: n/a Side effects thought to be attributed to current medication regimen: n/a Denies hypoglycemic/hyperglycemic symptoms Hypoglycemia prevention: not indicated at this time Current meal patterns: not discussed today Current exercise:  yard work On a statin: [] Yes  [x] No, intolerant    Current glucose readings: limits sweet; mostly drinks water with lemon but has sweet tea occasionally Continue to monitor blood glucose   Hypertension: Current medications: amlodipine 2.5 mg by mouth once daily and hydrochlorothiazide 25 mg by mouth once daily Intolerances: none Taking medications as directed: yes Side effects thought to be attributed to current medication regimen: no Denies dizziness, lightheadedness, blurred vision, and headache Home blood pressure readings: does not check Blood pressure under good control. Blood pressure is at goal of <130/80 mmHg per 2017 AHA/ACC guidelines. Continue amlodipine 2.5 mg by mouth once daily and hydrochlorothiazide 25 mg by mouth once daily Encourage dietary sodium restriction/DASH diet Recommend regular aerobic exercise Discussed need for medication compliance  Coronary artery disease/Hyperlipidemia: Current medications: ezetimibe 10 mg by mouth once daily Patient unsure if she is taking niacin Intolerances:  statins (myalgias) Taking medications as directed: yes Side effects thought to be attributed to current medication regimen: no Uncontrolled; LDL above goal of <100 due to high risk given at  least 2 major CV risk factors (advancing age, elevated LDL cholesterol, and hypertension) and 10-year risk 10-20% per 2020 AACE/ACE guidelines and TG above goal of <150 per 2020 AACE/ACE guidelines Continue ezetimibe 10 mg by mouth once daily Consider adding rosuvastatin 10 mg by mouth once daily for now to see if patient is able to tolerate. Patient willing to try a statin again despite issues a long time ago. We can further titrate if needed. Encourage dietary reduction of high fat containing foods such as butter, nuts, bacon, egg yolks, etc. Reviewed   risks of hyperlipidemia, principles of treatment and consequences of untreated hyperlipidemia Discussed need for medication compliance Re-check lipid panel in 4-12 weeks  Osteopenia: Current medications:  none Last DEXA (09/28/17): T score = between -1 and -2.4 Falls reported in last year: 0 Recommend 1,000 mg of elemental calcium per day from diet or supplementation. Supplemental calcium not necessary if appropriate amount obtained through diet. Recommend 800 - 2,000 IU of vitamin D supplementation per day to maintain adequate vitamin D levels Recommend walking, low impact aerobic exercise, or strength training  Patient Goals/Self-Care Activities Over the next 90  days, patient will:  Take medications as prescribed Start taking rosuvastatin 20 mg by mouth daily in addition to ezetimibe 10 mg by mouth daily. These are both to help with your cholesterol Start taking a calcium supplement. I recommend 1,000 mg of elemental calcium per day from diet or supplementation. Since your vitamin D level has continued to be low. I would recommend increasing your total daily intake of vitamin D to 2,000 units.   Follow Up Plan: Telephone follow up appointment with care management team member scheduled for: 11/17/20      Medication Assistance: None required.  Patient affirms current coverage meets needs.  Patient's preferred pharmacy is:  [id number] - [place], [place] - [address] [place] [address] Phone: [phone] Fax: [phone]  Follow Up:  Patient agrees to Care Plan and Follow-up.  Plan: Telephone follow up appointment with care management team member scheduled for:  11/17/20  Christopher Walston, PharmD Clinical Pharmacist [facility] [phone]      

## 2020-10-20 NOTE — Patient Instructions (Addendum)
Tennis Must,  It was great to talk to you today!  Please call me with any questions or concerns.   Visit Information   PATIENT GOALS:   Goals Addressed             This Visit's Progress    Medication Management       Patient Goals/Self-Care Activities Over the next 90  days, patient will:  Take medications as prescribed Start taking rosuvastatin 10 mg by mouth daily in addition to ezetimibe 10 mg by mouth daily. These are both to help with your cholesterol Start taking a calcium supplement. I recommend 1,000 mg of elemental calcium per day from diet or supplementation. Since your vitamin D level has continued to be low. I would recommend increasing your total daily intake of vitamin D to 2,000 units.         Consent to CCM Services: Ms. Kath was given information about Chronic Care Management services including:  CCM service includes personalized support from designated clinical staff supervised by her physician, including individualized plan of care and coordination with other care providers 24/7 contact phone numbers for assistance for urgent and routine care needs. Service will only be billed when office clinical staff spend 20 minutes or more in a month to coordinate care. Only one practitioner may furnish and bill the service in a calendar month. The patient may stop CCM services at any time (effective at the end of the month) by phone call to the office staff. The patient will be responsible for cost sharing (co-pay) of up to 20% of the service fee (after annual deductible is met).  Patient agreed to services and verbal consent obtained.   The patient verbalized understanding of instructions, educational materials, and care plan provided today and agreed to receive a mailed copy of patient instructions, educational materials, and care plan.   Telephone follow up appointment with care management team member scheduled for:11/17/20  Kennon Holter,  PharmD Clinical Pharmacist [facility] [phone]  CLINICAL CARE PLAN: Patient Care Plan: Medication Management     Problem Identified: HTN, CAD/HLD, Osteopenia, Pre-diabetes   Priority: High  Onset Date: 10/20/2020     Long-Range Goal: Disease Progression Prevention   Start Date: 10/20/2020  Expected End Date: 01/18/2021  This Visit's Progress: On track  Priority: High  Note:   Current Barriers:  Unable to achieve control of hyperlipidemia Suboptimal therapeutic regimen for hyperlipidemia and osteopenia  Pharmacist Clinical Goal(s):  Over the next 90  days, patient will Achieve control of hyperlipidemia as evidenced by improved LDL and improved triglycerides Adhere to plan to optimize therapeutic regimen for hyperlipidemia and osteopenia as evidenced by report of adherence to recommended medication management changes through collaboration with PharmD and provider.   Interventions: 1:1 collaboration with Fayrene Helper, MD regarding development and update of comprehensive plan of care as evidenced by provider attestation and co-signature Inter-disciplinary care team collaboration (see longitudinal plan of care) Comprehensive medication review performed; medication list updated in electronic medical record  Pre-diabetes: Current medications:  none Intolerances: none Taking medications as directed: n/a Side effects thought to be attributed to current medication regimen: n/a Denies hypoglycemic/hyperglycemic symptoms Hypoglycemia prevention: not indicated at this time Current meal patterns: not discussed today Current exercise:  yard work On a statin: [] Yes  [x] No, intolerant    Current glucose readings: limits sweet; mostly drinks water with lemon but has sweet tea occasionally Continue to monitor blood glucose   Hypertension: Current medications: amlodipine  2.5 mg by mouth once daily and hydrochlorothiazide 25 mg by mouth once daily Intolerances:  none Taking medications as directed: yes Side effects thought to be attributed to current medication regimen: no Denies dizziness, lightheadedness, blurred vision, and headache Home blood pressure readings: does not check Blood pressure under good control. Blood pressure is at goal of <130/80 mmHg per 2017 AHA/ACC guidelines. Continue amlodipine 2.5 mg by mouth once daily and hydrochlorothiazide 25 mg by mouth once daily Encourage dietary sodium restriction/DASH diet Recommend regular aerobic exercise Discussed need for medication compliance  Coronary artery disease/Hyperlipidemia: Current medications: ezetimibe 10 mg by mouth once daily Patient unsure if she is taking niacin Intolerances:  statins (myalgias) Taking medications as directed: yes Side effects thought to be attributed to current medication regimen: no Uncontrolled; LDL above goal of <100 due to high risk given at least 2 major CV risk factors (advancing age, elevated LDL cholesterol, and hypertension) and 10-year risk 10-20% per 2020 AACE/ACE guidelines and TG above goal of <150 per 2020 AACE/ACE guidelines Continue ezetimibe 10 mg by mouth once daily Consider adding rosuvastatin 10 mg by mouth once daily for now to see if patient is able to tolerate. Patient willing to try a statin again despite issues a long time ago. We can further titrate if needed. Encourage dietary reduction of high fat containing foods such as butter, nuts, bacon, egg yolks, etc. Reviewed risks of hyperlipidemia, principles of treatment and consequences of untreated hyperlipidemia Discussed need for medication compliance Re-check lipid panel in 4-12 weeks  Osteopenia: Current medications:  none Last DEXA (09/28/17): T score = between -1 and -2.4 Falls reported in last year: 0 Recommend 1,000 mg of elemental calcium per day from diet or supplementation. Supplemental calcium not necessary if appropriate amount obtained through diet. Recommend 800 -  2,000 IU of vitamin D supplementation per day to maintain adequate vitamin D levels Recommend walking, low impact aerobic exercise, or strength training  Patient Goals/Self-Care Activities Over the next 90  days, patient will:  Take medications as prescribed Start taking rosuvastatin 20 mg by mouth daily in addition to ezetimibe 10 mg by mouth daily. These are both to help with your cholesterol Start taking a calcium supplement. I recommend 1,000 mg of elemental calcium per day from diet or supplementation. Since your vitamin D level has continued to be low. I would recommend increasing your total daily intake of vitamin D to 2,000 units.   Follow Up Plan: Telephone follow up appointment with care management team member scheduled for: 11/17/20

## 2020-11-16 ENCOUNTER — Ambulatory Visit (INDEPENDENT_AMBULATORY_CARE_PROVIDER_SITE_OTHER): Payer: Medicare HMO | Admitting: Pharmacist

## 2020-11-16 DIAGNOSIS — I10 Essential (primary) hypertension: Secondary | ICD-10-CM

## 2020-11-16 DIAGNOSIS — M8589 Other specified disorders of bone density and structure, multiple sites: Secondary | ICD-10-CM

## 2020-11-16 DIAGNOSIS — I25119 Atherosclerotic heart disease of native coronary artery with unspecified angina pectoris: Secondary | ICD-10-CM

## 2020-11-16 DIAGNOSIS — R7303 Prediabetes: Secondary | ICD-10-CM

## 2020-11-16 DIAGNOSIS — E7849 Other hyperlipidemia: Secondary | ICD-10-CM

## 2020-11-16 NOTE — Patient Instructions (Signed)
Tennis Must,  It was great to talk to you today!  Please call me with any questions or concerns.   Visit Information  PATIENT GOALS:  Goals Addressed             This Visit's Progress    Medication Management       Patient Goals/Self-Care Activities Over the next 90 days, patient will:  Take medications as prescribed Start taking a calcium supplement. I recommend 1,000 mg of elemental calcium per day from diet or supplementation. Since your vitamin D level has continued to be low. I would recommend increasing your total daily intake of vitamin D to 2,000 units.         The patient verbalized understanding of instructions, educational materials, and care plan provided today and declined offer to receive copy of patient instructions, educational materials, and care plan.   Face to Face appointment with care management team member scheduled for: 11/18/20  Kennon Holter, PharmD Clinical Pharmacist [facility] [phone]

## 2020-11-16 NOTE — Chronic Care Management (AMB) (Signed)
Chronic Care Management Pharmacy Note  11/16/2020 Name:  Andrea Santiago MRN:  [id number] DOB:  10/25/1940  Summary:  Patient requests to follow-up on 11/18/20 when she comes in to see her PCP. Patient reports that her only remaining son passed away on Nov 23, 2020 and she is grieving his loss and unable to complete today's visit. Offered emotional support but patient states she is handling herself at the moment and is not in need or referral to chronic care management social worker. Will follow-up to ensure patient is tolerating statin therapy and has initiated calcium + vitamin D supplementation  Subjective: Andrea Santiago is an 80 y.o. year old female who is a primary patient of Fayrene Helper, MD.  The CCM team was consulted for assistance with disease management and care coordination needs.    Engaged with patient by telephone for follow up visit in response to provider referral for pharmacy case management and/or care coordination services.   Consent to Services:  The patient was given information about Chronic Care Management services, agreed to services, and gave verbal consent prior to initiation of services.  Please see initial visit note for detailed documentation.   Patient Care Team: Fayrene Helper, MD as PCP - Wardell Honour, MD as Consulting Physician (Pulmonary Disease) Bo Merino, MD as Consulting Physician (Rheumatology) Danie Binder, MD (Inactive) as Consulting Physician (Gastroenterology) Beryle Lathe, [facility] (Pharmacist)  Objective:  Lab Results  Component Value Date   CREATININE 0.78 06/09/2020   CREATININE 0.87 09/11/2019   CREATININE 0.77 04/08/2019    Lab Results  Component Value Date   HGBA1C 6.1 (H) 04/08/2019   Last diabetic Eye exam: No results found for: HMDIABEYEEXA  Last diabetic Foot exam: No results found for: HMDIABFOOTEX      Component Value Date/Time   CHOL 235 (H) 06/09/2020 1338   TRIG 260 (H)  06/09/2020 1338   HDL 40 06/09/2020 1338   CHOLHDL 5.9 (H) 06/09/2020 1338   CHOLHDL 4.0 04/08/2019 1059   VLDL 35 (H) 09/23/2016 1108   LDLCALC 147 (H) 06/09/2020 1338   LDLCALC 116 (H) 04/08/2019 1059   LDLDIRECT 141 (H) 10/31/2007 1021    Hepatic Function Latest Ref Rng & Units 06/09/2020 09/11/2019 04/08/2019  Total Protein 6.0 - 8.5 g/dL 7.6 7.4 7.3  Albumin 3.6 - 4.6 g/dL 4.7(H) 4.4 -  AST 0 - 40 IU/L _0 ALT 0 - 32 IU/L _1 Alk Phosphatase 44 - 121 IU/L 80 77 -  Total Bilirubin 0.0 - 1.2 mg/dL 0.3 0.4 0.4  Bilirubin, Direct 0.0 - 0.2 mg/dL - - -    Lab Results  Component Value Date/Time   TSH 2.390 06/09/2020 01:38 PM   TSH 0.85 05/09/2019 01:18 PM   FREET4 1.2 05/09/2019 01:18 PM   FREET4 1.3 04/29/2018 10:17 AM    CBC Latest Ref Rng & Units 09/11/2019 04/29/2018 05/05/2017  WBC 3.4 - 10.8 x10E3/uL 7.1 7.0 7.7  Hemoglobin 11.1 - 15.9 g/dL 14.1 13.9 13.2  Hematocrit 34.0 - 46.6 % 40.8 41.1 38.0  Platelets 150 - 450 x10E3/uL 268 345 254    Lab Results  Component Value Date/Time   VD25OH 17.7 (L) 06/09/2020 01:38 PM   VD25OH 20.0 (L) 09/11/2019 11:32 AM    Clinical ASCVD: No  The ASCVD Risk score (Arnett DK, et al., 2019) failed to calculate for the following reasons:   The 2019 ASCVD risk score is only valid for ages 47  to 42    Social History   Tobacco Use  Smoking Status Former   Packs/day: 0.50   Years: 1.00   Pack years: 0.50   Types: Cigarettes   Start date: 09/30/1961   Quit date: 10/01/1962   Years since quitting: 58.1  Smokeless Tobacco Never  Tobacco Comments   smoked only 1 year in her whole life   BP Readings from Last 3 Encounters:  08/25/20 (!) 150/71  07/21/20 121/69  06/10/20 120/80   Pulse Readings from Last 3 Encounters:  08/25/20 81  07/21/20 87  06/10/20 68   Wt Readings from Last 3 Encounters:  08/25/20 169 lb 12.8 oz (77 kg)  07/21/20 163 lb (73.9 kg)  06/10/20 167 lb (75.8 kg)    Assessment: Review of patient  past medical history, allergies, medications, health status, including review of consultants reports, laboratory and other test data, was performed as part of comprehensive evaluation and provision of chronic care management services.   SDOH:  (Social Determinants of Health) assessments and interventions performed:    CCM Care Plan  Allergies  Allergen Reactions   Statins Other (See Comments)    Leg Pain    Medications Reviewed Today     Reviewed by Beryle Lathe, [facility] (Pharmacist) on 11/16/20 at 1540  Med List Status: <None>   Medication Order Taking? Sig Documenting Provider Last Dose Status Informant  albuterol (VENTOLIN HFA) 108 (90 Base) MCG/ACT inhaler [id number] No Inhale 1 puff into the lungs every 6 (six) hours as needed for wheezing or shortness of breath. [provider] Taking Active   amLODipine (NORVASC) 2.5 MG tablet [id number] No Take 1 tablet (2.5 mg total) by mouth daily. Fayrene Helper, MD Taking Active   azelastine (ASTELIN) 0.1 % nasal spray [id number] No Use 2 sprays in each nostril every 12 hours for 1 week; After that, you may use 1 spray in each nostril twice a day as needed for allergies/congestion  Patient not taking: Reported on 10/20/2020   Fayrene Helper, MD Not Taking Active   budesonide-formoterol [facility]) 160-4.5 MCG/ACT inhaler [id number] No Inhale 2 puffs into the lungs 2 (two) times daily. Fayrene Helper, MD Taking Active   cyclobenzaprine (FLEXERIL) 5 MG tablet [id number] No TAKE 1 TABLET BY MOUTH EVERYDAY AT BEDTIME Fayrene Helper, MD Taking Active   diclofenac Sodium (VOLTAREN) 1 % GEL [id number] No APPLY 2-4 GRAMS TO AFFECTED JOINT 4 TIMES DAILY AS NEEDED. Fayrene Helper, MD Taking Active            Med Note Waldo Laine, Garen Grams Oct 20, 2020  3:19 PM) Only using at bedtime  DULoxetine (CYMBALTA) 60 MG capsule [id number] No TAKE 1 CAPSULE BY MOUTH 2 TIMES DAILY.  Patient taking differently: Take 60 mg by  mouth at bedtime.   Fayrene Helper, MD Taking Active   ezetimibe (ZETIA) 10 MG tablet [id number] No Take 1 tablet (10 mg total) by mouth daily. Fayrene Helper, MD Taking Active   fluticasone [employer]) 50 MCG/ACT nasal spray [id number] No Use 2 sprays in each nostril BID for a week. After 1 week, decrease to 1 spray in each nostril BID as needed for congestion/allergies. Fayrene Helper, MD Taking Active   Ginger, Zingiber officinalis, (GINGER PO) [id number] No Take by mouth. [provider] Taking Active   hydrochlorothiazide (HYDRODIURIL) 25 MG tablet [id number] No Take 1 tablet (25 mg total) by mouth daily. Fayrene Helper, MD Taking Active  levothyroxine (SYNTHROID) 88 MCG tablet [id number] No Take 1 tablet (88 mcg total) by mouth daily before breakfast. Fayrene Helper, MD Taking Active   lubiprostone (AMITIZA) 24 MCG capsule [id number] No TAKE 1 CAPSULE BY MOUTH 2 TIMES DAILY WITH A MEAL.  Patient not taking: No sig reported   Fayrene Helper, MD Not Taking Active   meclizine (ANTIVERT) 25 MG tablet [id number] No Take 1 tablet (25 mg total) by mouth 3 (three) times daily as needed for dizziness.  Patient not taking: Reported on 10/20/2020   Fayrene Helper, MD Not Taking Active   niacin (NIASPAN) 1000 MG CR tablet [id number] No TAKE 2 TABLETS (2,000 MG TOTAL) BY MOUTH AT BEDTIME.  Patient not taking: Reported on 10/20/2020   Fayrene Helper, MD Unknown Active   ondansetron [facility] ODT) 8 MG disintegrating tablet [id number] No Take 1 tablet (8 mg total) by mouth every 8 (eight) hours as needed for nausea or vomiting.  Patient not taking: Reported on 10/20/2020   Fayrene Helper, MD Not Taking Active   potassium chloride (KLOR-CON M10) 10 MEQ tablet [id number] No TAKE 3 TABLETS (30 MEQ TOTAL) BY MOUTH DAILY. Fayrene Helper, MD Taking Active   Probiotic Product (PROBIOTIC PO) [id number] No Take by mouth daily. [provider] Taking Active    rosuvastatin (CRESTOR) 10 MG tablet [id number]  Take 1 tablet (10 mg total) by mouth daily. Fayrene Helper, MD  Active   TURMERIC PO [id number] No Take by mouth daily. [provider] Taking Active   Vitamin D, Cholecalciferol, 10 MCG (400 UNIT) TABS [id number] No Take 600 Units by mouth daily. [provider] Taking Active Self            Patient Active Problem List   Diagnosis Date Noted   Breast pain, left 07/21/2020   Shoulder pain 09/09/2019   Chronic migraine without aura without status migrainosus, not intractable 04/20/2019   Heart murmur, systolic 60/11/9321   Constipation 11/12/2017   Chronic right SI joint pain 09/11/2017   Hip pain, chronic, right 09/11/2017   Posterior chest pain 09/11/2017   Depression, major, single episode, severe ([place]) 05/05/2017   Essential hypertension 05/05/2017   Osteopenia of multiple sites 05/29/2016   Vitamin D deficiency 05/25/2016   Primary osteoarthritis of both hands 05/11/2016   Primary osteoarthritis of both feet 05/11/2016   DJD (degenerative joint disease), cervical 05/11/2016   Spondylosis of lumbar region without myelopathy or radiculopathy 05/11/2016   Primary osteoarthritis of both knees 05/11/2016   Headache disorder 04/06/2016   Fibromyalgia 12/18/2015   Hypothyroidism 11/23/2015   Lumbar stenosis with neurogenic claudication 06/21/2015   At high risk for falls 03/28/2015   Multinodular goiter 03/30/2014   CAD (coronary atherosclerotic disease) 03/12/2013   Allergic rhinitis 06/12/2011   Hypothyroid 01/30/2011   Prediabetes 10/26/2009   Overweight 11/22/2008   Low back pain with left-sided sciatica 03/24/2008   Hyperlipemia 03/06/2006   Hypertension 03/06/2006   GERD 03/06/2006   Myalgia and myositis 03/06/2006   Osteoporosis 03/06/2006    Immunization History  Administered Date(s) Administered   Fluad Quad(high Dose 65+) 11/03/2019   H1N1 02/05/2008   Influenza Split 11/21/2013    Influenza Whole 11/19/2008, 10/26/2009, 11/01/2010   Influenza, High Dose Seasonal PF 01/14/2018   Influenza, Quadrivalent, Recombinant, Inj, Pf 10/30/2018   Influenza,inj,Quad PF,6+ Mos 11/20/2012, 11/02/2014, 12/13/2015, 10/16/2016   Influenza-Unspecified 03/31/2019, 11/03/2019   Moderna Sars-Covid-2 Vaccination 03/23/2020   PFIZER(Purple Top)SARS-COV-2 Vaccination 05/15/2019, 06/07/2019   Pneumococcal  Conjugate-13 03/30/2014   Pneumococcal Polysaccharide-23 07/08/2009   Td 10/26/2009   Zoster Recombinat (Shingrix) 01/23/2018   Zoster, Live 01/30/2011    Conditions to be addressed/monitored: CAD, HTN, HLD, osteopenia, and pre-diabetes   Care Plan : Medication Management  Updates made by Beryle Lathe, [place] since 11/16/2020 12:00 AM     Problem: HTN, CAD/HLD, Osteopenia, Pre-diabetes   Priority: High  Onset Date: 10/20/2020     Long-Range Goal: Disease Progression Prevention   Start Date: 10/20/2020  Expected End Date: 01/18/2021  Recent Progress: On track  Priority: High  Note:   Current Barriers:  Unable to achieve control of hyperlipidemia Suboptimal therapeutic regimen for hyperlipidemia and osteopenia  Pharmacist Clinical Goal(s):  Over the next 90 days, patient will Achieve control of hyperlipidemia as evidenced by improved LDL and improved triglycerides Adhere to plan to optimize therapeutic regimen for hyperlipidemia and osteopenia as evidenced by report of adherence to recommended medication management changes through collaboration with PharmD and provider.   Interventions: 1:1 collaboration with Fayrene Helper, MD regarding development and update of comprehensive plan of care as evidenced by provider attestation and co-signature Inter-disciplinary care team collaboration (see longitudinal plan of care) Comprehensive medication review performed; medication list updated in electronic medical record  Pre-diabetes: Current medications:  none Denies  hypoglycemic/hyperglycemic symptoms Hypoglycemia prevention: not indicated at this time Current meal patterns: not discussed today Current exercise:  yard work On a statin: _0  Yes  _1  No Current glucose readings: limits sweet; mostly drinks water with lemon but has sweet tea occasionally Continue to monitor A1c and fasting blood glucose   Hypertension: Current medications: amlodipine 2.5 mg by mouth once daily and hydrochlorothiazide 25 mg by mouth once daily Intolerances: none Taking medications as directed: yes Side effects thought to be attributed to current medication regimen: no Denies dizziness, lightheadedness, blurred vision, and headache Home blood pressure readings: does not check Blood pressure under good control. Blood pressure is at goal of <130/80 mmHg per 2017 AHA/ACC guidelines. Continue amlodipine 2.5 mg by mouth once daily and hydrochlorothiazide 25 mg by mouth once daily Encourage dietary sodium restriction/DASH diet Recommend regular aerobic exercise Discussed need for medication compliance  Coronary artery disease/Hyperlipidemia: Current medications: rosuvastatin 10 mg by mouth once daily and ezetimibe 10 mg by mouth once daily Unclear if patient is taking rosuvastatin. Not showing in dispense report.  Intolerances:  statins (myalgias) Taking medications as directed:  unknown Side effects thought to be attributed to current medication regimen:  to be determined Uncontrolled; LDL above goal of <100 due to high risk given at least 2 major CV risk factors (advancing age, elevated LDL cholesterol, and hypertension) and 10-year risk 10-20% per 2020 AACE/ACE guidelines and TG above goal of <150 per 2020 AACE/ACE guidelines Continue ezetimibe 10 mg by mouth once daily and rosuvastatin 10 mg by mouth once daily  Follow-up to make sure patient is tolerating statin given prior intolerance (myalgias) Encourage dietary reduction of high fat containing foods such as butter,  nuts, bacon, egg yolks, etc. Reviewed risks of hyperlipidemia, principles of treatment and consequences of untreated hyperlipidemia Discussed need for medication compliance Re-check lipid panel in 4-12 weeks  Osteopenia: Current medications:  none Last DEXA (09/28/17): T score = between -1 and -2.4 Falls reported in last year: 0 Recommend 1,000 mg of elemental calcium per day from diet or supplementation. Supplemental calcium not necessary if appropriate amount obtained through diet. Recommend 800 - 2,000 IU of vitamin D supplementation per day to maintain  adequate vitamin D levels Recommend walking, low impact aerobic exercise, or strength training  Patient Goals/Self-Care Activities Over the next 90 days, patient will:  Take medications as prescribed Start taking a calcium supplement. I recommend 1,000 mg of elemental calcium per day from diet or supplementation. Since your vitamin D level has continued to be low. I would recommend increasing your total daily intake of vitamin D to 2,000 units.   Follow Up Plan: Face to Face appointment with care management team member scheduled for: 11/18/20      Medication Assistance: None required.  Patient affirms current coverage meets needs.  Patient's preferred pharmacy is:  [id number] [place] [id number]: [phone]: [phone] Follow Up:  Patient agrees to Care Plan and Follow-up.  Plan: Face to Face appointment with care management team member scheduled for: 11/18/20  CKennon Holter PharmD Clinical Pharmacist [facility] [phone]

## 2020-11-18 ENCOUNTER — Ambulatory Visit: Payer: Medicare HMO | Admitting: Family Medicine

## 2020-11-18 ENCOUNTER — Telehealth: Payer: Self-pay | Admitting: Family Medicine

## 2020-11-18 ENCOUNTER — Ambulatory Visit: Payer: Medicare HMO

## 2020-11-18 NOTE — Telephone Encounter (Signed)
Pt Cancelled her appt, her son commited suicide. I offered condolences, and advised if she needed [place] to reach out    Card sent

## 2020-11-19 DIAGNOSIS — E7849 Other hyperlipidemia: Secondary | ICD-10-CM | POA: Diagnosis not present

## 2020-11-19 DIAGNOSIS — I10 Essential (primary) hypertension: Secondary | ICD-10-CM | POA: Diagnosis not present

## 2020-11-19 DIAGNOSIS — I25119 Atherosclerotic heart disease of native coronary artery with unspecified angina pectoris: Secondary | ICD-10-CM | POA: Diagnosis not present

## 2020-11-19 NOTE — Telephone Encounter (Signed)
We can get her rescheduled for maybe the end of Oct. Can do so in a few weeks

## 2020-11-21 ENCOUNTER — Other Ambulatory Visit: Payer: Self-pay | Admitting: Family Medicine

## 2020-11-22 NOTE — Telephone Encounter (Signed)
Appointment scheduled Nov 1.

## 2020-12-05 ENCOUNTER — Other Ambulatory Visit: Payer: Self-pay | Admitting: Family Medicine

## 2020-12-10 ENCOUNTER — Other Ambulatory Visit: Payer: Self-pay | Admitting: Family Medicine

## 2020-12-21 ENCOUNTER — Other Ambulatory Visit: Payer: Self-pay

## 2020-12-21 ENCOUNTER — Ambulatory Visit (INDEPENDENT_AMBULATORY_CARE_PROVIDER_SITE_OTHER): Payer: Medicare HMO | Admitting: Family Medicine

## 2020-12-21 ENCOUNTER — Encounter (INDEPENDENT_AMBULATORY_CARE_PROVIDER_SITE_OTHER): Payer: Self-pay

## 2020-12-21 ENCOUNTER — Encounter: Payer: Self-pay | Admitting: Family Medicine

## 2020-12-21 ENCOUNTER — Ambulatory Visit: Payer: Medicare HMO

## 2020-12-21 VITALS — BP 135/72 | HR 81 | Resp 16 | Ht 64.0 in | Wt 169.8 lb

## 2020-12-21 DIAGNOSIS — E7849 Other hyperlipidemia: Secondary | ICD-10-CM

## 2020-12-21 DIAGNOSIS — I10 Essential (primary) hypertension: Secondary | ICD-10-CM

## 2020-12-21 DIAGNOSIS — F322 Major depressive disorder, single episode, severe without psychotic features: Secondary | ICD-10-CM | POA: Diagnosis not present

## 2020-12-21 DIAGNOSIS — E039 Hypothyroidism, unspecified: Secondary | ICD-10-CM | POA: Diagnosis not present

## 2020-12-21 DIAGNOSIS — K219 Gastro-esophageal reflux disease without esophagitis: Secondary | ICD-10-CM

## 2020-12-21 DIAGNOSIS — F324 Major depressive disorder, single episode, in partial remission: Secondary | ICD-10-CM | POA: Diagnosis not present

## 2020-12-21 DIAGNOSIS — E663 Overweight: Secondary | ICD-10-CM | POA: Diagnosis not present

## 2020-12-21 DIAGNOSIS — I350 Nonrheumatic aortic (valve) stenosis: Secondary | ICD-10-CM | POA: Diagnosis not present

## 2020-12-21 DIAGNOSIS — J449 Chronic obstructive pulmonary disease, unspecified: Secondary | ICD-10-CM | POA: Diagnosis not present

## 2020-12-21 MED ORDER — FLUOXETINE HCL 20 MG PO TABS
20.0000 mg | ORAL_TABLET | Freq: Every day | ORAL | 2 refills | Status: DC
Start: 2020-12-21 — End: 2021-01-14

## 2020-12-21 NOTE — Patient Instructions (Addendum)
F/u in 3 months, re evaluate depression, call if you  need me sooner  Fasting lipid, CBC, tSH and cmp and eGFR this week, tomorrow preferably  You are referred to Therapist, and  new additional medication is sent , fluoxetine  It is important that you exercise regularly at least 30 minutes 5 times a week. If you develop chest pain, have severe difficulty breathing, or feel very tired, stop exercising immediately and seek medical attention  Think about what you will eat, plan ahead. Choose " clean, green, fresh or frozen" over canned, processed or packaged foods which are more sugary, salty and fatty. 70 to 75% of food eaten should be vegetables and fruit. Three meals at set times with snacks allowed between meals, but they must be fruit or vegetables. Aim to eat over a 12 hour period , example 7 am to 7 pm, and STOP after  your last meal of the day. Drink water,generally about 64 ounces per day, no other drink is as healthy. Fruit juice is best enjoyed in a healthy way, by EATING the fruit. Thanks for choosing [facility], we consider it a privelige to serve you.

## 2020-12-21 NOTE — Progress Notes (Signed)
Andrea Santiago     MRN: [id number]      DOB: 1940/04/15   HPI Andrea Santiago is here for follow up and re-evaluation of chronic medical conditions, medication management and review of any available recent lab and radiology data.  Preventive health is updated, specifically  Cancer screening and Immunization.   Lost her 2nd and only other child in September , the first loss was 1 year befor in 2021, she is coping as best able with her faith in Andrea Santiago and with the support of friend The PT denies any adverse reactions to current medications since the last visit.  C/o fatigue  in the past 6 months, depression screen is positive, she is not suicidal or homicidal, she is interested in therapy ROS Denies recent fever or chills. Denies sinus pressure, nasal congestion, ear pain or sore throat. Denies chest congestion, productive cough or wheezing. Denies chest pains, palpitations and leg swelling Denies abdominal pain, nausea, vomiting,diarrhea or constipation.   Denies dysuria, frequency, hesitancy or incontinence. Chronic  joint pain, swelling and limitation in mobility. Denies headaches, seizures, numbness, or tingling. Denies skin break down or rash.   PE  BP 135/72   Pulse 81   Resp 16   Ht 5\' 4"  (1.626 m)   Wt 169 lb 12.8 oz (77 kg)   SpO2 96%   BMI 29.15 kg/m   Patient alert and oriented and in no cardiopulmonary distress.  HEENT: No facial asymmetry, EOMI,     Neck supple .  Chest: Clear to auscultation bilaterally.  CVS: S1, S2 systolic  murmurs, no S3.Regular rate.  ABD: Soft non tender.   Ext: No edema  MS: Adequate though reduced  ROM spine, shoulders, hips and knees.  Skin: Intact, no ulcerations or rash noted.  Psych: Good eye contact, normal affect. Memory intact not anxious or depressed appearing.  CNS: CN 2-12 intact, power,  normal throughout.no focal deficits noted.   Assessment & Plan  Hypertension Controlled, no change in medication DASH diet and  commitment to daily physical activity for a minimum of 30 minutes discussed and encouraged, as a part of hypertension management. The importance of attaining a healthy weight is also discussed.  BP/Weight 12/21/2020 08/25/2020 07/21/2020 06/10/2020 02/25/2020 12/01/2019 4/62/7035  Systolic BP 009 381 829 937 169 678 938  Diastolic BP 72 71 69 80 71 78 72  Wt. (Lbs) 169.8 169.8 163 167 159.8 162.2 159  BMI 29.15 29.15 27.98 28.67 27.43 27.84 27.29       Overweight  Patient re-educated about  the importance of commitment to a  minimum of 150 minutes of exercise per week as able.  The importance of healthy food choices with portion control discussed, as well as eating regularly and within a 12 hour window most days. The need to choose "clean , green" food 50 to 75% of the time is discussed, as well as to make water the primary drink and set a goal of 64 ounces water daily.    Weight /BMI 12/21/2020 08/25/2020 07/21/2020  WEIGHT 169 lb 12.8 oz 169 lb 12.8 oz 163 lb  HEIGHT 5\' 4"  5\' 4"  5\' 4"   BMI 29.15 kg/m2 29.15 kg/m2 27.98 kg/m2      Hyperlipemia Hyperlipidemia:Low fat diet discussed and encouraged.   Lipid Panel  Lab Results  Component Value Date   CHOL 235 (H) 06/09/2020   HDL 40 06/09/2020   LDLCALC 147 (H) 06/09/2020   LDLDIRECT 141 (H) 10/31/2007   TRIG 260 (H)  06/09/2020   CHOLHDL 5.9 (H) 06/09/2020  uncontrolled    Updated lab needed.   GERD Controlled, no change in medication   Hypothyroid Updated lab needed c/o fatigue.   Depression, major, single episode, severe ([facility]) Score of 12 in 12/2020, start fluoxetine and refer to Therapy, re eval in 8 to 10 weeks  Aortic stenosis Moderate to severe, currently on 6 month schedule for echo , followed closely by Cardiology

## 2020-12-22 ENCOUNTER — Encounter: Payer: Self-pay | Admitting: Family Medicine

## 2020-12-22 DIAGNOSIS — I350 Nonrheumatic aortic (valve) stenosis: Secondary | ICD-10-CM | POA: Insufficient documentation

## 2020-12-22 NOTE — Assessment & Plan Note (Signed)
Updated lab needed c/o fatigue.

## 2020-12-22 NOTE — Assessment & Plan Note (Signed)
Score of 12 in 12/2020, start fluoxetine and refer to Therapy, re eval in 8 to 10 weeks

## 2020-12-22 NOTE — Assessment & Plan Note (Signed)
  Patient re-educated about  the importance of commitment to a  minimum of 150 minutes of exercise per week as able.  The importance of healthy food choices with portion control discussed, as well as eating regularly and within a 12 hour window most days. The need to choose "clean , green" food 50 to 75% of the time is discussed, as well as to make water the primary drink and set a goal of 64 ounces water daily.    Weight /BMI 12/21/2020 08/25/2020 07/21/2020  WEIGHT 169 lb 12.8 oz 169 lb 12.8 oz 163 lb  HEIGHT 5\' 4"  5\' 4"  5\' 4"   BMI 29.15 kg/m2 29.15 kg/m2 27.98 kg/m2

## 2020-12-22 NOTE — Assessment & Plan Note (Signed)
Moderate to severe, currently on 6 month schedule for echo , followed closely by Cardiology

## 2020-12-22 NOTE — Assessment & Plan Note (Signed)
Hyperlipidemia:Low fat diet discussed and encouraged.   Lipid Panel  Lab Results  Component Value Date   CHOL 235 (H) 06/09/2020   HDL 40 06/09/2020   LDLCALC 147 (H) 06/09/2020   LDLDIRECT 141 (H) 10/31/2007   TRIG 260 (H) 06/09/2020   CHOLHDL 5.9 (H) 06/09/2020  uncontrolled    Updated lab needed.

## 2020-12-22 NOTE — Assessment & Plan Note (Signed)
Controlled, no change in medication  

## 2020-12-22 NOTE — Assessment & Plan Note (Signed)
Controlled, no change in medication DASH diet and commitment to daily physical activity for a minimum of 30 minutes discussed and encouraged, as a part of hypertension management. The importance of attaining a healthy weight is also discussed.  BP/Weight 12/21/2020 08/25/2020 07/21/2020 06/10/2020 02/25/2020 12/01/2019 6/38/4536  Systolic BP 468 032 122 482 500 370 488  Diastolic BP 72 71 69 80 71 78 72  Wt. (Lbs) 169.8 169.8 163 167 159.8 162.2 159  BMI 29.15 29.15 27.98 28.67 27.43 27.84 27.29

## 2020-12-23 ENCOUNTER — Other Ambulatory Visit: Payer: Self-pay

## 2020-12-23 ENCOUNTER — Ambulatory Visit (INDEPENDENT_AMBULATORY_CARE_PROVIDER_SITE_OTHER): Payer: Medicare HMO | Admitting: Licensed Clinical Social Worker

## 2020-12-23 DIAGNOSIS — F324 Major depressive disorder, single episode, in partial remission: Secondary | ICD-10-CM

## 2020-12-28 DIAGNOSIS — E7849 Other hyperlipidemia: Secondary | ICD-10-CM | POA: Diagnosis not present

## 2020-12-28 DIAGNOSIS — I10 Essential (primary) hypertension: Secondary | ICD-10-CM | POA: Diagnosis not present

## 2020-12-28 DIAGNOSIS — E039 Hypothyroidism, unspecified: Secondary | ICD-10-CM | POA: Diagnosis not present

## 2020-12-29 LAB — CMP14+EGFR
ALT: 24 IU/L (ref 0–32)
AST: 32 IU/L (ref 0–40)
Albumin/Globulin Ratio: 1.6 (ref 1.2–2.2)
Albumin: 4.4 g/dL (ref 3.6–4.6)
Alkaline Phosphatase: 69 IU/L (ref 44–121)
BUN/Creatinine Ratio: 14 (ref 12–28)
BUN: 13 mg/dL (ref 8–27)
Bilirubin Total: 0.4 mg/dL (ref 0.0–1.2)
CO2: 23 mmol/L (ref 20–29)
Calcium: 9.7 mg/dL (ref 8.7–10.3)
Chloride: 104 mmol/L (ref 96–106)
Creatinine, Ser: 0.9 mg/dL (ref 0.57–1.00)
Globulin, Total: 2.7 g/dL (ref 1.5–4.5)
Glucose: 120 mg/dL — ABNORMAL HIGH (ref 70–99)
Potassium: 4.2 mmol/L (ref 3.5–5.2)
Sodium: 141 mmol/L (ref 134–144)
Total Protein: 7.1 g/dL (ref 6.0–8.5)
eGFR: 64 mL/min/{1.73_m2} (ref >59)

## 2020-12-29 LAB — CBC
Hematocrit: 39.8 % (ref 34.0–46.6)
Hemoglobin: 13.8 g/dL (ref 11.1–15.9)
MCH: 29.9 pg (ref 26.6–33.0)
MCHC: 34.7 g/dL (ref 31.5–35.7)
MCV: 86 fL (ref 79–97)
Platelets: 283 10*3/uL (ref 150–450)
RBC: 4.61 x10E6/uL (ref 3.77–5.28)
RDW: 13 % (ref 11.7–15.4)
WBC: 8.3 10*3/uL (ref 3.4–10.8)

## 2020-12-29 LAB — LIPID PANEL
Chol/HDL Ratio: 2.8 ratio (ref 0.0–4.4)
Cholesterol, Total: 111 mg/dL (ref 100–199)
HDL: 40 mg/dL (ref >39)
LDL Chol Calc (NIH): 48 mg/dL (ref 0–99)
Triglycerides: 128 mg/dL (ref 0–149)
VLDL Cholesterol Cal: 23 mg/dL (ref 5–40)

## 2020-12-29 LAB — TSH: TSH: 0.746 u[IU]/mL (ref 0.450–4.500)

## 2020-12-29 NOTE — Progress Notes (Signed)
Left vm

## 2021-01-04 ENCOUNTER — Ambulatory Visit (INDEPENDENT_AMBULATORY_CARE_PROVIDER_SITE_OTHER): Payer: Medicare HMO | Admitting: Licensed Clinical Social Worker

## 2021-01-04 ENCOUNTER — Ambulatory Visit (INDEPENDENT_AMBULATORY_CARE_PROVIDER_SITE_OTHER): Payer: Medicare HMO | Admitting: Pharmacist

## 2021-01-04 ENCOUNTER — Other Ambulatory Visit: Payer: Self-pay

## 2021-01-04 DIAGNOSIS — I10 Essential (primary) hypertension: Secondary | ICD-10-CM

## 2021-01-04 DIAGNOSIS — M8589 Other specified disorders of bone density and structure, multiple sites: Secondary | ICD-10-CM

## 2021-01-04 DIAGNOSIS — E7849 Other hyperlipidemia: Secondary | ICD-10-CM

## 2021-01-04 DIAGNOSIS — I25119 Atherosclerotic heart disease of native coronary artery with unspecified angina pectoris: Secondary | ICD-10-CM

## 2021-01-04 DIAGNOSIS — F324 Major depressive disorder, single episode, in partial remission: Secondary | ICD-10-CM

## 2021-01-04 DIAGNOSIS — R7303 Prediabetes: Secondary | ICD-10-CM

## 2021-01-04 NOTE — Patient Instructions (Signed)
Tennis Must,  It was great to talk to you today!  Please call me with any questions or concerns.   Visit Information  Patient Goals/Self-Care Activities Over the next 90 days, patient will:  Take medications as prescribed  The patient verbalized understanding of instructions, educational materials, and care plan provided today and declined offer to receive copy of patient instructions, educational materials, and care plan.   Telephone follow up appointment with care management team member scheduled for: 04/05/21  Kennon Holter, PharmD Clinical Pharmacist [facility] [phone]

## 2021-01-04 NOTE — Progress Notes (Signed)
Left message encouraging contact 

## 2021-01-04 NOTE — Chronic Care Management (AMB) (Signed)
Chronic Care Management Pharmacy Note  01/04/2021 Name:  Andrea Santiago MRN:  [id number] DOB:  09/11/1940  Summary:  Coronary artery disease/Hyperlipidemia: Patient is tolerating rosuvastatin 10 mg daily. Lipid panel much improved.   Osteopenia: Patient reports diet high in calcium intake from dairy sources (milk, cheese, and yogurt). Recommend 1,000 mg of elemental calcium per day from diet or supplementation. Supplemental calcium not necessary if appropriate amount obtained through diet. Recommend 800 - 2,000 IU of vitamin D supplementation per day to maintain adequate vitamin D levels Recommend walking, low impact aerobic exercise, or strength training  Subjective: Andrea Santiago is an 80 y.o. year old female who is a primary patient of Fayrene Helper, MD.  The CCM team was consulted for assistance with disease management and care coordination needs.    Engaged with patient by telephone for follow up visit in response to provider referral for pharmacy case management and/or care coordination services.   Consent to Services:  The patient was given information about Chronic Care Management services, agreed to services, and gave verbal consent prior to initiation of services.  Please see initial visit note for detailed documentation.   Patient Care Team: Fayrene Helper, MD as PCP - Wardell Honour, MD as Consulting Physician (Pulmonary Disease) Bo Merino, MD as Consulting Physician (Rheumatology) Danie Binder, MD (Inactive) as Consulting Physician (Gastroenterology) Beryle Lathe, [facility] (Pharmacist)  Objective:  Lab Results  Component Value Date   CREATININE 0.90 12/28/2020   CREATININE 0.78 06/09/2020   CREATININE 0.87 09/11/2019    Lab Results  Component Value Date   HGBA1C 6.1 (H) 04/08/2019   Last diabetic Eye exam: No results found for: HMDIABEYEEXA  Last diabetic Foot exam: No results found for: HMDIABFOOTEX      Component  Value Date/Time   CHOL 111 12/28/2020 1007   TRIG 128 12/28/2020 1007   HDL 40 12/28/2020 1007   CHOLHDL 2.8 12/28/2020 1007   CHOLHDL 4.0 04/08/2019 1059   VLDL 35 (H) 09/23/2016 1108   LDLCALC 48 12/28/2020 1007   LDLCALC 116 (H) 04/08/2019 1059   LDLDIRECT 141 (H) 10/31/2007 1021    Hepatic Function Latest Ref Rng & Units 12/28/2020 06/09/2020 09/11/2019  Total Protein 6.0 - 8.5 g/dL 7.1 7.6 7.4  Albumin 3.6 - 4.6 g/dL 4.4 4.7(H) 4.4  AST 0 - 40 IU/L 32 29 25  ALT 0 - 32 IU/L _0 Alk Phosphatase 44 - 121 IU/L 69 80 77  Total Bilirubin 0.0 - 1.2 mg/dL 0.4 0.3 0.4  Bilirubin, Direct 0.0 - 0.2 mg/dL - - -    Lab Results  Component Value Date/Time   TSH 0.746 12/28/2020 10:07 AM   TSH 2.390 06/09/2020 01:38 PM   FREET4 1.2 05/09/2019 01:18 PM   FREET4 1.3 04/29/2018 10:17 AM    CBC Latest Ref Rng & Units 12/28/2020 09/11/2019 04/29/2018  WBC 3.4 - 10.8 x10E3/uL 8.3 7.1 7.0  Hemoglobin 11.1 - 15.9 g/dL 13.8 14.1 13.9  Hematocrit 34.0 - 46.6 % 39.8 40.8 41.1  Platelets 150 - 450 x10E3/uL 283 268 345    Lab Results  Component Value Date/Time   VD25OH 17.7 (L) 06/09/2020 01:38 PM   VD25OH 20.0 (L) 09/11/2019 11:32 AM    Clinical ASCVD: No  The ASCVD Risk score (Arnett DK, et al., 2019) failed to calculate for the following reasons:   The 2019 ASCVD risk score is only valid for ages 72 to 25  Social History   Tobacco Use  Smoking Status Former   Packs/day: 0.50   Years: 1.00   Pack years: 0.50   Types: Cigarettes   Start date: 09/30/1961   Quit date: 10/01/1962   Years since quitting: 58.3  Smokeless Tobacco Never  Tobacco Comments   smoked only 1 year in her whole life   BP Readings from Last 3 Encounters:  12/21/20 135/72  08/25/20 (!) 150/71  07/21/20 121/69   Pulse Readings from Last 3 Encounters:  12/21/20 81  08/25/20 81  07/21/20 87   Wt Readings from Last 3 Encounters:  12/21/20 169 lb 12.8 oz (77 kg)  08/25/20 169 lb 12.8 oz (77 kg)   07/21/20 163 lb (73.9 kg)    Assessment: Review of patient past medical history, allergies, medications, health status, including review of consultants reports, laboratory and other test data, was performed as part of comprehensive evaluation and provision of chronic care management services.   SDOH:  (Social Determinants of Health) assessments and interventions performed:    CCM Care Plan  Allergies  Allergen Reactions   Statins Other (See Comments)    Leg Pain; tolerating rosuvastatin    Medications Reviewed Today     Reviewed by Beryle Lathe, [facility] (Pharmacist) on 01/04/21 at [place] List Status: <None>   Medication Order Taking? Sig Documenting Provider Last Dose Status Informant  albuterol (VENTOLIN HFA) 108 (90 Base) MCG/ACT inhaler [id number] Yes Inhale 1 puff into the lungs every 6 (six) hours as needed for wheezing or shortness of breath. [provider] Taking Active   amLODipine (NORVASC) 2.5 MG tablet [id number] Yes Take 1 tablet (2.5 mg total) by mouth daily. Fayrene Helper, MD Taking Active   budesonide-formoterol [facility]) 160-4.5 MCG/ACT inhaler [id number] Yes Inhale 2 puffs into the lungs 2 (two) times daily. Fayrene Helper, MD Taking Active   cyclobenzaprine (FLEXERIL) 5 MG tablet [id number] Yes TAKE 1 TABLET BY MOUTH EVERYDAY AT BEDTIME Fayrene Helper, MD Taking Active   diclofenac Sodium (VOLTAREN) 1 % GEL [id number] Yes APPLY 2-4 GRAMS TO AFFECTED JOINT 4 TIMES DAILY AS NEEDED. Fayrene Helper, MD Taking Active            Med Note Vernell Barrier Oct 20, 2020  3:19 PM) Only using at bedtime  DULoxetine (CYMBALTA) 60 MG capsule [id number] Yes TAKE 1 CAPSULE BY MOUTH TWICE A DAY Fayrene Helper, MD Taking Active   ezetimibe (ZETIA) 10 MG tablet [id number] Yes Take 1 tablet (10 mg total) by mouth daily. Fayrene Helper, MD Taking Active   FLUoxetine (PROZAC) 20 MG tablet [id number] Yes Take 1 tablet (20 mg total)  by mouth daily. Fayrene Helper, MD Taking Active   fluticasone [facility]) 50 MCG/ACT nasal spray [id number] Yes Use 2 sprays in each nostril BID for a week. After 1 week, decrease to 1 spray in each nostril BID as needed for congestion/allergies. Fayrene Helper, MD Taking Active   Ginger, Zingiber officinalis, (GINGER PO) [id number] Yes Take by mouth. [provider] Taking Active   hydrochlorothiazide (HYDRODIURIL) 25 MG tablet [id number] Yes Take 1 tablet (25 mg total) by mouth daily. Fayrene Helper, MD Taking Active   levothyroxine (SYNTHROID) 88 MCG tablet [id number] Yes Take 1 tablet (88 mcg total) by mouth daily before breakfast. Fayrene Helper, MD Taking Active   potassium chloride (KLOR-CON M10) 10 MEQ tablet [id number] Yes TAKE 3 TABLETS BY MOUTH DAILY. Fayrene Helper, MD  Taking Active   Probiotic Product (PROBIOTIC PO) [id number] Yes Take by mouth daily. [provider] Taking Active   rosuvastatin (CRESTOR) 10 MG tablet [id number] Yes Take 1 tablet (10 mg total) by mouth daily. Fayrene Helper, MD Taking Active   TURMERIC PO [id number] Yes Take by mouth daily. [provider] Taking Active   Vitamin D, Cholecalciferol, 10 MCG (400 UNIT) TABS [id number] Yes Take 2,000 Units by mouth daily. [provider] Taking Active Self            Patient Active Problem List   Diagnosis Date Noted   Aortic stenosis 12/22/2020   Breast pain, left 07/21/2020   Shoulder pain 09/09/2019   Chronic migraine without aura without status migrainosus, not intractable 04/20/2019   Heart murmur, systolic 83/25/4982   Constipation 11/12/2017   Chronic right SI joint pain 09/11/2017   Hip pain, chronic, right 09/11/2017   Posterior chest pain 09/11/2017   Depression, major, single episode, severe ([place]) 05/05/2017   Essential hypertension 05/05/2017   Osteopenia of multiple sites 05/29/2016   Vitamin D deficiency 05/25/2016   Primary osteoarthritis  of both hands 05/11/2016   Primary osteoarthritis of both feet 05/11/2016   DJD (degenerative joint disease), cervical 05/11/2016   Spondylosis of lumbar region without myelopathy or radiculopathy 05/11/2016   Primary osteoarthritis of both knees 05/11/2016   Headache disorder 04/06/2016   Fibromyalgia 12/18/2015   Hypothyroidism 11/23/2015   Lumbar stenosis with neurogenic claudication 06/21/2015   At high risk for falls 03/28/2015   Multinodular goiter 03/30/2014   CAD (coronary atherosclerotic disease) 03/12/2013   Allergic rhinitis 06/12/2011   Hypothyroid 01/30/2011   Prediabetes 10/26/2009   Overweight 11/22/2008   Low back pain with left-sided sciatica 03/24/2008   Hyperlipemia 03/06/2006   Hypertension 03/06/2006   GERD 03/06/2006   Myalgia and myositis 03/06/2006   Osteoporosis 03/06/2006    Immunization History  Administered Date(s) Administered   Fluad Quad(high Dose 65+) 11/03/2019   H1N1 02/05/2008   Influenza Split 11/21/2013   Influenza Whole 11/19/2008, 10/26/2009, 11/01/2010   Influenza, High Dose Seasonal PF 01/14/2018   Influenza, Quadrivalent, Recombinant, Inj, Pf 10/30/2018   Influenza,inj,Quad PF,6+ Mos 11/20/2012, 11/02/2014, 12/13/2015, 10/16/2016   Influenza-Unspecified 03/31/2019, 11/03/2019   Moderna Sars-Covid-2 Vaccination 03/23/2020   PFIZER(Purple Top)SARS-COV-2 Vaccination 05/15/2019, 06/07/2019   Pneumococcal Conjugate-13 03/30/2014   Pneumococcal Polysaccharide-23 07/08/2009   Td 10/26/2009   Zoster Recombinat (Shingrix) 01/23/2018   Zoster, Live 01/30/2011    Conditions to be addressed/monitored: CAD, HTN, HLD, Osteopenia, and pre-diabetes  Care Plan : Medication Management  Updates made by Beryle Lathe, [place] since 01/04/2021 12:00 AM     Problem: HTN, CAD/HLD, Osteopenia, Pre-diabetes   Priority: High  Onset Date: 10/20/2020     Long-Range Goal: Disease Progression Prevention   Start Date: 10/20/2020  Expected End  Date: 01/18/2021  Recent Progress: On track  Priority: High  Note:   Current Barriers:  Unable to independently monitor therapeutic efficacy  Pharmacist Clinical Goal(s):  Over the next 90 days, patient will Achieve adherence to monitoring guidelines and medication adherence to achieve therapeutic efficacy through collaboration with PharmD and provider.   Interventions: 1:1 collaboration with Fayrene Helper, MD regarding development and update of comprehensive plan of care as evidenced by provider attestation and co-signature Inter-disciplinary care team collaboration (see longitudinal plan of care) Comprehensive medication review performed; medication list updated in electronic medical record  Pre-diabetes: Current medications:  none Denies hypoglycemic/hyperglycemic symptoms Hypoglycemia prevention: not indicated at  this time Current meal patterns: not discussed today Current exercise:  yard work On a statin: [x] Yes  [] No Current glucose readings: limits sweet; mostly drinks water with lemon but has sweet tea occasionally Continue to monitor A1c and fasting blood glucose   Hypertension: Current medications: amlodipine 2.5 mg by mouth once daily and hydrochlorothiazide 25 mg by mouth once daily Intolerances: none Taking medications as directed: yes Side effects thought to be attributed to current medication regimen: no Denies dizziness, lightheadedness, blurred vision, and headache Home blood pressure readings: does not check Blood pressure under good control. Blood pressure is at goal of <130/80 mmHg per 2017 AHA/ACC guidelines. Continue amlodipine 2.5 mg by mouth once daily and hydrochlorothiazide 25 mg by mouth once daily Encourage dietary sodium restriction/DASH diet Recommend regular aerobic exercise Discussed need for medication compliance  Coronary artery disease/Hyperlipidemia: Current medications: rosuvastatin 10 mg by mouth once daily and ezetimibe 10 mg by  mouth once daily Intolerances:  statins (myalgias) but is tolerating rosuvastatin 10 mg Taking medications as directed:  yes Side effects thought to be attributed to current medication regimen:  no Controlled; LDL at goal of <100 due to moderate risk given <2 major CV risk factors (hypertension) and 10-year risk <10% per 2020 AACE/ACE guidelines. Triglycerides at goal of <150 per 2020 AACE/ACE guidelines. Continue ezetimibe 10 mg by mouth once daily and rosuvastatin 10 mg by mouth once daily  Encourage dietary reduction of high fat containing foods such as butter, nuts, bacon, egg yolks, etc. Reviewed risks of hyperlipidemia, principles of treatment and consequences of untreated hyperlipidemia Discussed need for medication compliance  Osteopenia: Current medications:  vitamin D3 2,000 units by mouth daily Patient reports diet high in calcium intake from dairy sources (milk, cheese, and yogurt) Last DEXA (09/28/17): T score = between -1 and -2.4 Falls reported in last year: 0 Recommend 1,000 mg of elemental calcium per day from diet or supplementation. Supplemental calcium not necessary if appropriate amount obtained through diet. Recommend 800 - 2,000 IU of vitamin D supplementation per day to maintain adequate vitamin D levels Recommend walking, low impact aerobic exercise, or strength training  Patient Goals/Self-Care Activities Over the next 90 days, patient will:  Take medications as prescribed  Follow Up Plan: Telephone follow up appointment with care management team member scheduled for: 04/05/21      Medication Assistance: None required.  Patient affirms current coverage meets needs.  Patient's preferred pharmacy is:  [id number] [place] [id number]: [phone]: [phone] Follow Up:  Patient agrees to Care Plan and Follow-up.  Plan: Telephone follow up appointment with  care management team member scheduled for:  04/05/21  CKennon Holter PharmD Clinical Pharmacist RDutchess [facility] Center[facility] [phone]

## 2021-01-13 ENCOUNTER — Other Ambulatory Visit: Payer: Self-pay | Admitting: Family Medicine

## 2021-01-19 DIAGNOSIS — I25119 Atherosclerotic heart disease of native coronary artery with unspecified angina pectoris: Secondary | ICD-10-CM

## 2021-01-19 DIAGNOSIS — I10 Essential (primary) hypertension: Secondary | ICD-10-CM | POA: Diagnosis not present

## 2021-01-19 DIAGNOSIS — E7849 Other hyperlipidemia: Secondary | ICD-10-CM | POA: Diagnosis not present

## 2021-01-20 ENCOUNTER — Other Ambulatory Visit: Payer: Self-pay | Admitting: Family Medicine

## 2021-01-20 DIAGNOSIS — E7849 Other hyperlipidemia: Secondary | ICD-10-CM

## 2021-01-25 ENCOUNTER — Other Ambulatory Visit: Payer: Self-pay

## 2021-01-25 ENCOUNTER — Encounter (INDEPENDENT_AMBULATORY_CARE_PROVIDER_SITE_OTHER): Payer: Self-pay

## 2021-01-25 ENCOUNTER — Ambulatory Visit (INDEPENDENT_AMBULATORY_CARE_PROVIDER_SITE_OTHER): Payer: Medicare HMO | Admitting: Licensed Clinical Social Worker

## 2021-01-25 DIAGNOSIS — Z91199 Patient's noncompliance with other medical treatment and regimen due to unspecified reason: Secondary | ICD-10-CM

## 2021-01-25 NOTE — BH Specialist Note (Signed)
No show; left message encouraging contact

## 2021-01-26 ENCOUNTER — Other Ambulatory Visit: Payer: Medicare HMO

## 2021-01-31 ENCOUNTER — Ambulatory Visit (INDEPENDENT_AMBULATORY_CARE_PROVIDER_SITE_OTHER): Payer: Medicare HMO | Admitting: Cardiology

## 2021-01-31 ENCOUNTER — Encounter: Payer: Self-pay | Admitting: Cardiology

## 2021-01-31 VITALS — BP 160/68 | HR 72 | Ht 64.0 in | Wt 169.0 lb

## 2021-01-31 DIAGNOSIS — I350 Nonrheumatic aortic (valve) stenosis: Secondary | ICD-10-CM | POA: Diagnosis not present

## 2021-01-31 DIAGNOSIS — I10 Essential (primary) hypertension: Secondary | ICD-10-CM

## 2021-01-31 DIAGNOSIS — E782 Mixed hyperlipidemia: Secondary | ICD-10-CM | POA: Diagnosis not present

## 2021-01-31 NOTE — Patient Instructions (Addendum)
Medication Instructions:  Continue all current medications.  Labwork: none  Testing/Procedures: none  Follow-Up: Your physician wants you to follow up in: 6 months.  You will receive a reminder letter in the mail one-two months in advance.  If you don't receive a letter, please call our office to schedule the follow up appointment   Any Other Special Instructions Will Be Listed Below (If Applicable). Needs nurse vitals check on 03/08/2021 - has echo this day.    If you need a refill on your cardiac medications before your next appointment, please call your pharmacy.
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
## 2021-02-05 ENCOUNTER — Other Ambulatory Visit: Payer: Self-pay | Admitting: Family Medicine
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
## 2021-02-10 ENCOUNTER — Other Ambulatory Visit: Payer: Self-pay

## 2021-02-10 ENCOUNTER — Ambulatory Visit (INDEPENDENT_AMBULATORY_CARE_PROVIDER_SITE_OTHER): Payer: Medicare HMO | Admitting: Licensed Clinical Social Worker

## 2021-02-10 DIAGNOSIS — F324 Major depressive disorder, single episode, in partial remission: Secondary | ICD-10-CM

## 2021-02-23 ENCOUNTER — Ambulatory Visit: Payer: Medicare HMO | Admitting: Rheumatology

## 2021-02-25 NOTE — Progress Notes (Signed)
Patient requested a call back

## 2021-02-28 ENCOUNTER — Other Ambulatory Visit: Payer: Self-pay | Admitting: Family Medicine

## 2021-02-28 ENCOUNTER — Telehealth: Payer: Self-pay | Admitting: Family Medicine

## 2021-02-28 MED ORDER — BUTALBITAL-APAP-CAFFEINE 50-325-40 MG PO TABS: 1.0000 | ORAL_TABLET | Freq: Four times a day (QID) | ORAL | 0 refills | Status: DC | PRN

## 2021-02-28 NOTE — Telephone Encounter (Signed)
Patient aware med sent- wants to wait until her appt Friday to see you

## 2021-02-28 NOTE — Telephone Encounter (Signed)
Pt called nurse line to cxl appt, I called the pt to advise there was no appt, however I placed her on the sch with DR S on Friday at 29.  And I would send a note back that she needed migraine medication

## 2021-02-28 NOTE — Telephone Encounter (Signed)
States her migraine started lastnight and whole head is throbbing to lights and noise. Rates pain 10. Wants headache med sent to [facility]

## 2021-03-01 ENCOUNTER — Other Ambulatory Visit: Payer: Self-pay

## 2021-03-01 ENCOUNTER — Ambulatory Visit (INDEPENDENT_AMBULATORY_CARE_PROVIDER_SITE_OTHER): Payer: Medicare HMO | Admitting: Licensed Clinical Social Worker

## 2021-03-01 DIAGNOSIS — F324 Major depressive disorder, single episode, in partial remission: Secondary | ICD-10-CM

## 2021-03-01 NOTE — BH Specialist Note (Signed)
[facility] Follow Up Assessment  MRN: [id number] NAME: Andrea Santiago Date: 03/01/21  Start time: 61 End time: 920 Total time:  10 min  Type of Contact: Follow up Call  Current concerns/stressors: denies  Screens/Assessment Tools:  Unable to get a reading  Functional Assessment:  Sleep: fair Appetite: fair Coping ability: WNL Patient taking medications as prescribed:  yes  Current medications:  Outpatient Encounter Medications as of 03/01/2021  Medication Sig   albuterol (VENTOLIN HFA) 108 (90 Base) MCG/ACT inhaler Inhale 1 puff into the lungs every 6 (six) hours as needed for wheezing or shortness of breath.   amLODipine (NORVASC) 2.5 MG tablet Take 1 tablet (2.5 mg total) by mouth daily.   budesonide-formoterol (SYMBICORT) 160-4.5 MCG/ACT inhaler Inhale 2 puffs into the lungs 2 (two) times daily.   butalbital-acetaminophen-caffeine (FIORICET) 50-325-40 MG tablet Take 1 tablet by mouth every 6 (six) hours as needed for headache.   cyclobenzaprine (FLEXERIL) 5 MG tablet TAKE 1 TABLET BY MOUTH EVERYDAY AT BEDTIME   diclofenac Sodium (VOLTAREN) 1 % GEL APPLY 2-4 GRAMS TO AFFECTED JOINT 4 TIMES DAILY AS NEEDED.   DULoxetine (CYMBALTA) 60 MG capsule TAKE 1 CAPSULE BY MOUTH TWICE A DAY   ezetimibe (ZETIA) 10 MG tablet Take 1 tablet (10 mg total) by mouth daily.   FLUoxetine (PROZAC) 20 MG capsule TAKE 1 CAPSULE BY MOUTH EVERY DAY   fluticasone (FLONASE) 50 MCG/ACT nasal spray Use 2 sprays in each nostril BID for a week. After 1 week, decrease to 1 spray in each nostril BID as needed for congestion/allergies.   Ginger, Zingiber officinalis, (GINGER PO) Take by mouth.   hydrochlorothiazide (HYDRODIURIL) 25 MG tablet Take 1 tablet (25 mg total) by mouth daily.   levothyroxine (SYNTHROID) 88 MCG tablet Take 1 tablet (88 mcg total) by mouth daily before breakfast.   potassium chloride (KLOR-CON M10) 10 MEQ tablet TAKE 3 TABLETS BY MOUTH DAILY.   Probiotic Product (PROBIOTIC  PO) Take by mouth daily.   rosuvastatin (CRESTOR) 10 MG tablet TAKE 1 TABLET BY MOUTH EVERY DAY   TURMERIC PO Take by mouth daily.   Vitamin D, Cholecalciferol, 10 MCG (400 UNIT) TABS Take 2,000 Units by mouth daily.   No facility-administered encounter medications on file as of 03/01/2021.    Self-harm and/or Suicidal Behaviors Risk Assessment Self-harm risk factors: no Patient endorses recent self injurious thoughts and/or behaviors: No   Suicide ideations: No plan to harm self or others   Danger to Others Risk Assessment Danger to others risk factors: no Patient endorses recent thoughts of harming others: No    Substance Use Assessment Patient recently consumed alcohol: No  Patient recently used drugs: No  Patient is concerned about dependence or abuse of substances: No    Goals, Interventions and Follow-up Plan Goals: Increase healthy adjustment to current life circumstances Interventions: Motivational Interviewing   Summary of Clinical Assessment  Andrea Santiago was briefly available for her session today. She denies any current concerns/stressors.  She reports that she encountered stress around the holidays and does not often has depression or anxiety. At first, she requested to end the call but was able to state that she would like a call back in 4 weeks and if symptoms continue to be absent then she will be discharged.   Follow-up Plan:  one month follow up Lubertha South, LCSW
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
## 2021-03-02 ENCOUNTER — Telehealth (INDEPENDENT_AMBULATORY_CARE_PROVIDER_SITE_OTHER): Payer: Medicare HMO | Admitting: Licensed Clinical Social Worker

## 2021-03-02 DIAGNOSIS — F324 Major depressive disorder, single episode, in partial remission: Secondary | ICD-10-CM

## 2021-03-02 NOTE — BH Specialist Note (Signed)
Virtual Behavioral Health Treatment Plan Team Note  MRN: [id number] NAME: Andrea Santiago  DATE: 03/02/21  Start time:   328p End time:  332p Total time:  5 min  Total number of Virtual [place] Treatment Team Plan encounters: 1/4  Treatment Team Attendees: Royal Piedra, LCSW & Dr. Modesta Messing  Diagnoses: No diagnosis found.  Goals, Interventions and Follow-up Plan Goals: Increase healthy adjustment to current life circumstances Interventions: Motivational Interviewing Medication Management Recommendations: n/a; pending further evaluation Follow-up Plan: one month follow up  History of the present illness Presenting Problem/Current Symptoms: sadness  Psychiatric History  Depression: Yes Anxiety: No Mania: No Psychosis: No PTSD symptoms: No  Past Psychiatric History/Hospitalization(s): Hospitalization for psychiatric illness: No Prior Suicide Attempts: No Prior Self-injurious behavior: No  Psychosocial stressors Flowsheet Row Virtual [place] [facility] Follow Up from 06/21/2017 in [place], Family conflict, Grief/losses  Familial Stressors Abandonment  [By her family - adult children]  Sleep Difficulty falling asleep, Other (Comment)  [Fibromyalgia and thyroid problems ]  Appetite No problems  Coping ability Overwhelmed  [Does talk to PCP & write in a book]  Patient taking medications as prescribed No prescribed medications  [Stopped Fluoxetine]       Self-harm Behaviors Risk Assessment Flowsheet Row Virtual [place] Phone Follow Up from 06/21/2017 in [facility]  Self-harm risk factors Social withdrawal/isolation, Chronic pain  Have you recently had any thoughts about harming yourself? No       Screenings PHQ-9 Assessments:  Depression screen [facility] 2/9 12/21/2020 10/05/2020 07/21/2020  Decreased Interest 3 0 0  Down, Depressed, Hopeless 3 0 1  PHQ - 2 Score 6 0 1  Altered sleeping 0 - -  Tired, decreased energy 3 - -  Change in  appetite 1 - -  Feeling bad or failure about yourself  1 - -  Trouble concentrating 0 - -  Moving slowly or fidgety/restless 1 - -  Suicidal thoughts 0 - -  PHQ-9 Score 12 - -  Difficult doing work/chores Somewhat difficult - -  Some recent data might be hidden   GAD-7 Assessments: No flowsheet data found.  Past Medical History Past Medical History:  Diagnosis Date   ALLERGIC RHINITIS    Annual physical exam 03/26/2015   Arthritis    Bronchitis, acute    Complication of anesthesia    Constipation    NOS   COPD (chronic obstructive pulmonary disease) (HCC)    bronchitis- chronic, followed by Dr. Susann Givens    Depression    Fibromyalgia    GERD (gastroesophageal reflux disease)    no longer using omprazole, ginger is her remedy for indigestion    HOH (hard of hearing)    Hyperlipemia    Hypertension    Hypothyroidism    Left shoulder pain 05/06/2009   Qualifier: Diagnosis of  By: Claybon Jabs PA, Dawn     Meniere's disease    Osteoporosis    Other fatigue 02/05/2008   Qualifier: Diagnosis of  By: Cori Razor LPN, Brandi     PONV (postoperative nausea and vomiting)    Varicose veins     Vital signs: There were no vitals filed for this visit.  Allergies:  Allergies as of 03/02/2021 - Review Complete 01/31/2021  Allergen Reaction Noted   Statins Other (See Comments) 02/08/2007    Medication History Current medications:  Outpatient Encounter Medications as of 03/02/2021  Medication Sig   albuterol (VENTOLIN HFA) 108 (90 Base) MCG/ACT inhaler Inhale 1 puff into  the lungs every 6 (six) hours as needed for wheezing or shortness of breath.   amLODipine (NORVASC) 2.5 MG tablet Take 1 tablet (2.5 mg total) by mouth daily.   budesonide-formoterol (SYMBICORT) 160-4.5 MCG/ACT inhaler Inhale 2 puffs into the lungs 2 (two) times daily.   butalbital-acetaminophen-caffeine (FIORICET) 50-325-40 MG tablet Take 1 tablet by mouth every 6 (six) hours as needed for headache.   cyclobenzaprine  (FLEXERIL) 5 MG tablet TAKE 1 TABLET BY MOUTH EVERYDAY AT BEDTIME   diclofenac Sodium (VOLTAREN) 1 % GEL APPLY 2-4 GRAMS TO AFFECTED JOINT 4 TIMES DAILY AS NEEDED.   DULoxetine (CYMBALTA) 60 MG capsule TAKE 1 CAPSULE BY MOUTH TWICE A DAY   ezetimibe (ZETIA) 10 MG tablet Take 1 tablet (10 mg total) by mouth daily.   FLUoxetine (PROZAC) 20 MG capsule TAKE 1 CAPSULE BY MOUTH EVERY DAY   fluticasone (FLONASE) 50 MCG/ACT nasal spray Use 2 sprays in each nostril BID for a week. After 1 week, decrease to 1 spray in each nostril BID as needed for congestion/allergies.   Ginger, Zingiber officinalis, (GINGER PO) Take by mouth.   hydrochlorothiazide (HYDRODIURIL) 25 MG tablet Take 1 tablet (25 mg total) by mouth daily.   levothyroxine (SYNTHROID) 88 MCG tablet Take 1 tablet (88 mcg total) by mouth daily before breakfast.   potassium chloride (KLOR-CON M10) 10 MEQ tablet TAKE 3 TABLETS BY MOUTH DAILY.   Probiotic Product (PROBIOTIC PO) Take by mouth daily.   rosuvastatin (CRESTOR) 10 MG tablet TAKE 1 TABLET BY MOUTH EVERY DAY   TURMERIC PO Take by mouth daily.   Vitamin D, Cholecalciferol, 10 MCG (400 UNIT) TABS Take 2,000 Units by mouth daily.   No facility-administered encounter medications on file as of 03/02/2021.     Scribe for Treatment Team: Lubertha South, LCSW

## 2021-03-02 NOTE — Progress Notes (Signed)
[facility] Psychiatric Consultant Case Review   Andrea Santiago is a 81 y.o. year old female with a history of AS, hypertension, HL, fibromyalgia.  She denies depressive symptoms and anxiety on initial evaluation.   Assessment/Provisional Diagnosis # r/o MDD Evaluation was limited due to patient request to end the interview with [facility] specialist.  Although she denies any significant mood symptoms, she is on 2 antidepressant according to the chart review.   Will do further evaluation  Recommendation - Unable to do full evaluation due to patient request.She is on duloxetine 60 mg twice a day, fluoxetine 20 mg daily. Noted that it is usually recommended to consolidate to 1 antidepressant.  We will continue to evaluate.   Thank you for your consult. We will continue to follow the patient. Please contact [name]  for any questions or concerns.   The above treatment considerations and suggestions are based on consultation with the [facility] specialist and/or PCP and a review of information available in the shared registry and [name]. I have not personally examined the patient. All recommendations should be implemented with consideration of the patient's relevant prior history and current clinical status. Please feel free to call me with any questions about the care of this patient.

## 2021-03-03 ENCOUNTER — Other Ambulatory Visit: Payer: Self-pay | Admitting: Family Medicine

## 2021-03-04 ENCOUNTER — Ambulatory Visit: Payer: Medicare HMO | Admitting: Family Medicine

## 2021-03-08 ENCOUNTER — Ambulatory Visit: Payer: Medicare HMO

## 2021-03-08 ENCOUNTER — Other Ambulatory Visit: Payer: Medicare HMO

## 2021-03-15 ENCOUNTER — Ambulatory Visit: Payer: Medicare HMO | Admitting: Rheumatology

## 2021-03-15 DIAGNOSIS — M8589 Other specified disorders of bone density and structure, multiple sites: Secondary | ICD-10-CM

## 2021-03-15 DIAGNOSIS — Z8679 Personal history of other diseases of the circulatory system: Secondary | ICD-10-CM

## 2021-03-15 DIAGNOSIS — M5136 Other intervertebral disc degeneration, lumbar region: Secondary | ICD-10-CM

## 2021-03-15 DIAGNOSIS — M19041 Primary osteoarthritis, right hand: Secondary | ICD-10-CM

## 2021-03-15 DIAGNOSIS — Z8659 Personal history of other mental and behavioral disorders: Secondary | ICD-10-CM

## 2021-03-15 DIAGNOSIS — Z8639 Personal history of other endocrine, nutritional and metabolic disease: Secondary | ICD-10-CM

## 2021-03-15 DIAGNOSIS — M797 Fibromyalgia: Secondary | ICD-10-CM

## 2021-03-15 DIAGNOSIS — M19071 Primary osteoarthritis, right ankle and foot: Secondary | ICD-10-CM

## 2021-03-15 DIAGNOSIS — M62838 Other muscle spasm: Secondary | ICD-10-CM

## 2021-03-15 DIAGNOSIS — M170 Bilateral primary osteoarthritis of knee: Secondary | ICD-10-CM

## 2021-03-15 DIAGNOSIS — M7061 Trochanteric bursitis, right hip: Secondary | ICD-10-CM

## 2021-03-15 DIAGNOSIS — E559 Vitamin D deficiency, unspecified: Secondary | ICD-10-CM

## 2021-03-15 DIAGNOSIS — M542 Cervicalgia: Secondary | ICD-10-CM

## 2021-03-15 DIAGNOSIS — R5383 Other fatigue: Secondary | ICD-10-CM

## 2021-03-17 ENCOUNTER — Ambulatory Visit: Payer: Medicare HMO

## 2021-03-24 ENCOUNTER — Ambulatory Visit: Payer: Medicare HMO | Admitting: Family Medicine

## 2021-03-24 DIAGNOSIS — H5203 Hypermetropia, bilateral: Secondary | ICD-10-CM | POA: Diagnosis not present

## 2021-03-24 DIAGNOSIS — H524 Presbyopia: Secondary | ICD-10-CM | POA: Diagnosis not present

## 2021-03-24 DIAGNOSIS — Z961 Presence of intraocular lens: Secondary | ICD-10-CM | POA: Diagnosis not present

## 2021-03-29 ENCOUNTER — Ambulatory Visit (INDEPENDENT_AMBULATORY_CARE_PROVIDER_SITE_OTHER): Payer: Medicare HMO | Admitting: Family Medicine

## 2021-03-29 ENCOUNTER — Other Ambulatory Visit: Payer: Self-pay

## 2021-03-29 ENCOUNTER — Encounter: Payer: Self-pay | Admitting: Family Medicine

## 2021-03-29 VITALS — BP 152/75 | HR 73 | Resp 16 | Ht 64.0 in | Wt 169.1 lb

## 2021-03-29 DIAGNOSIS — R2681 Unsteadiness on feet: Secondary | ICD-10-CM

## 2021-03-29 DIAGNOSIS — E7849 Other hyperlipidemia: Secondary | ICD-10-CM

## 2021-03-29 DIAGNOSIS — E039 Hypothyroidism, unspecified: Secondary | ICD-10-CM | POA: Diagnosis not present

## 2021-03-29 DIAGNOSIS — I10 Essential (primary) hypertension: Secondary | ICD-10-CM

## 2021-03-29 DIAGNOSIS — E663 Overweight: Secondary | ICD-10-CM | POA: Diagnosis not present

## 2021-03-29 DIAGNOSIS — M797 Fibromyalgia: Secondary | ICD-10-CM | POA: Diagnosis not present

## 2021-03-29 DIAGNOSIS — R7303 Prediabetes: Secondary | ICD-10-CM | POA: Diagnosis not present

## 2021-03-29 DIAGNOSIS — J449 Chronic obstructive pulmonary disease, unspecified: Secondary | ICD-10-CM | POA: Diagnosis not present

## 2021-03-29 DIAGNOSIS — R29898 Other symptoms and signs involving the musculoskeletal system: Secondary | ICD-10-CM | POA: Diagnosis not present

## 2021-03-29 LAB — POCT GLYCOSYLATED HEMOGLOBIN (HGB A1C): HbA1c, POC (prediabetic range): 6.2 % (ref 5.7–6.4)

## 2021-03-29 MED ORDER — AMLODIPINE BESYLATE 5 MG PO TABS: 5.0000 mg | ORAL_TABLET | Freq: Every day | ORAL | 3 refills | Status: DC

## 2021-03-29 NOTE — Assessment & Plan Note (Signed)
3 month histoiry, progressively worsening with lower extremity weakness, refer Neurology

## 2021-03-29 NOTE — Progress Notes (Signed)
Andrea Santiago     MRN: [id number]      DOB: Oct 28, 1940   HPI Andrea Santiago is here for follow up and re-evaluation of chronic medical conditions, medication management and review of any available recent lab and radiology data.  Preventive health is updated, specifically  Cancer screening and Immunization.   3 month h/o increasing bilateral LE weakness and unsteady gait espas she first initiates movement, denies new numbness or incontinence , scared as she has no idea why this is happening  ROS Denies recent fever or chills. Denies sinus pressure, nasal congestion, ear pain or sore throat. Denies chest congestion, productive cough or wheezing. Denies chest pains, palpitations and leg swelling Denies abdominal pain, nausea, vomiting,diarrhea or constipation.   Denies dysuria, frequency, hesitancy c/o mild  incontinence. Denies headaches, seizures, numbness, or tingling. Denies  uncontrolled depression, anxiety or insomnia. Denies skin break down or rash.   PE  BP (!) 152/75    Pulse 73    Resp 16    Ht 5\' 4"  (1.626 m)    Wt 169 lb 1.9 oz (76.7 kg)    SpO2 96%    BMI 29.03 kg/m   Patient alert and oriented and in no cardiopulmonary distress.  HEENT: No facial asymmetry, EOMI,     Neck decreased ROM .  Chest: Clear to auscultation bilaterally.  CVS: S1, S2 nsystolic murmur, no S3.Regular rate.  ABD: Soft non tender.   Ext: No edema  MS: decreased  ROM spine, shoulders, hips and knees.  Skin: Intact, no ulcerations or rash noted.  Psych: Good eye contact, normal affect. Memory intact not anxious or depressed appearing.  CNS: CN 2-12 intact, power,  grade 4 power in lE, normal sensation in all 4 extremities  Assessment & Plan  Prediabetes Patient educated about the importance of limiting  Carbohydrate intake , the need to commit to daily physical activity for a minimum of 30 minutes , and to commit weight loss. The fact that changes in all these areas will reduce or  eliminate all together the development of diabetes is stressed.  Deteriuroiated, needs to reduce carb intake  Diabetic Labs Latest Ref Rng & Units 12/28/2020 06/09/2020 09/11/2019 04/08/2019 10/02/2018  HbA1c <5.7 % of total Hgb - - - 6.1(H) 6.2(H)  Chol 100 - 199 mg/dL 111 235(H) 225(H) 196 229(H)  HDL >39 mg/dL 40 40 44 49(L) 40(L)  Calc LDL 0 - 99 mg/dL 48 147(H) 139(H) 116(H) 148(H)  Triglycerides 0 - 149 mg/dL 128 260(H) 232(H) 192(H) 269(H)  Creatinine 0.57 - 1.00 mg/dL 0.90 0.78 0.87 0.77 0.79   BP/Weight 03/29/2021 01/31/2021 12/21/2020 08/25/2020 07/21/2020 03/24/5425 0/07/2374  Systolic BP 283 151 761 607 371 062 694  Diastolic BP 75 68 72 71 69 80 71  Wt. (Lbs) 169.12 169 169.8 169.8 163 167 159.8  BMI 29.03 29.01 29.15 29.15 27.98 28.67 27.43   No flowsheet data found.  Updated lab needed  Hypertension Uncontrolled , inc MLODIPINE TO 5 MG WITH CLOSE F/U DASH diet and commitment to daily physical activity for a minimum of 30 minutes discussed and encouraged, as a part of hypertension management. The importance of attaining a healthy weight is also discussed.  BP/Weight 03/29/2021 01/31/2021 12/21/2020 08/25/2020 07/21/2020 8/54/6270 04/25/91  Systolic BP 818 299 371 696 789 381 017  Diastolic BP 75 68 72 71 69 80 71  Wt. (Lbs) 169.12 169 169.8 169.8 163 167 159.8  BMI 29.03 29.01 29.15 29.15 27.98 28.67 27.43  Bilateral leg weakness 3 month h/o lower ext weakness and unsteady gait, neurology to eval, denies  incontinence or numbness  Unsteady gait 3 month histoiry, progressively worsening with lower extremity weakness, refer Neurology  Overweight  Patient re-educated about  the importance of commitment to a  minimum of 150 minutes of exercise per week as able.  The importance of healthy food choices with portion control discussed, as well as eating regularly and within a 12 hour window most days. The need to choose "clean , green" food 50 to 75% of the time is discussed, as  well as to make water the primary drink and set a goal of 64 ounces water daily.    Weight /BMI 03/29/2021 01/31/2021 12/21/2020  WEIGHT 169 lb 1.9 oz 169 lb 169 lb 12.8 oz  HEIGHT 5\' 4"  5\' 4"  5\' 4"   BMI 29.03 kg/m2 29.01 kg/m2 29.15 kg/m2   unchnaged   Hypothyroidism Updated lab needed at/ before next visit. Controlled when last checked  Fibromyalgia Followed by rheumatology, stable   Hyperlipemia Hyperlipidemia:Low fat diet discussed and encouraged.   Lipid Panel  Lab Results  Component Value Date   CHOL 111 12/28/2020   HDL 40 12/28/2020   LDLCALC 48 12/28/2020   LDLDIRECT 141 (H) 10/31/2007   TRIG 128 12/28/2020   CHOLHDL 2.8 12/28/2020   Updated lab needed at/ before next visit.

## 2021-03-29 NOTE — Patient Instructions (Signed)
Annual exam 4/23 or after, re eval BP at visit, call if you need me sooner  New higher dose of amlodipine is 5 mg daily, stop the 2.5 mg tablet, please, blood pressure is too high  You are referred to Neurology about leg weakness  Careful not to fall  GlycoHB in office today to assess blood sugar  Thanks for choosing [facility], we consider it a privelige to serve you.

## 2021-03-29 NOTE — Assessment & Plan Note (Signed)
Updated lab needed at/ before next visit. Controlled when last checked 

## 2021-03-29 NOTE — Assessment & Plan Note (Signed)
Hyperlipidemia:Low fat diet discussed and encouraged.   Lipid Panel  Lab Results  Component Value Date   CHOL 111 12/28/2020   HDL 40 12/28/2020   LDLCALC 48 12/28/2020   LDLDIRECT 141 (H) 10/31/2007   TRIG 128 12/28/2020   CHOLHDL 2.8 12/28/2020   Updated lab needed at/ before next visit.

## 2021-03-29 NOTE — Assessment & Plan Note (Signed)
Uncontrolled , inc MLODIPINE TO 5 MG WITH CLOSE F/U DASH diet and commitment to daily physical activity for a minimum of 30 minutes discussed and encouraged, as a part of hypertension management. The importance of attaining a healthy weight is also discussed.  BP/Weight 03/29/2021 01/31/2021 12/21/2020 08/25/2020 07/21/2020 1/85/5015 09/26/8255  Systolic BP 493 552 174 715 953 967 289  Diastolic BP 75 68 72 71 69 80 71  Wt. (Lbs) 169.12 169 169.8 169.8 163 167 159.8  BMI 29.03 29.01 29.15 29.15 27.98 28.67 27.43

## 2021-03-29 NOTE — Assessment & Plan Note (Signed)
Followed by rheumatology, stable

## 2021-03-29 NOTE — Assessment & Plan Note (Signed)
°  Patient re-educated about  the importance of commitment to a  minimum of 150 minutes of exercise per week as able.  The importance of healthy food choices with portion control discussed, as well as eating regularly and within a 12 hour window most days. The need to choose "clean , green" food 50 to 75% of the time is discussed, as well as to make water the primary drink and set a goal of 64 ounces water daily.    Weight /BMI 03/29/2021 01/31/2021 12/21/2020  WEIGHT 169 lb 1.9 oz 169 lb 169 lb 12.8 oz  HEIGHT 5\' 4"  5\' 4"  5\' 4"   BMI 29.03 kg/m2 29.01 kg/m2 29.15 kg/m2   unchnaged

## 2021-03-29 NOTE — Assessment & Plan Note (Signed)
3 month h/o lower ext weakness and unsteady gait, neurology to eval, denies  incontinence or numbness

## 2021-03-29 NOTE — Assessment & Plan Note (Addendum)
Patient educated about the importance of limiting  Carbohydrate intake , the need to commit to daily physical activity for a minimum of 30 minutes , and to commit weight loss. The fact that changes in all these areas will reduce or eliminate all together the development of diabetes is stressed.  Deteriuroiated, needs to reduce carb intake  Diabetic Labs Latest Ref Rng & Units 12/28/2020 06/09/2020 09/11/2019 04/08/2019 10/02/2018  HbA1c <5.7 % of total Hgb - - - 6.1(H) 6.2(H)  Chol 100 - 199 mg/dL 111 235(H) 225(H) 196 229(H)  HDL >39 mg/dL 40 40 44 49(L) 40(L)  Calc LDL 0 - 99 mg/dL 48 147(H) 139(H) 116(H) 148(H)  Triglycerides 0 - 149 mg/dL 128 260(H) 232(H) 192(H) 269(H)  Creatinine 0.57 - 1.00 mg/dL 0.90 0.78 0.87 0.77 0.79   BP/Weight 03/29/2021 01/31/2021 12/21/2020 08/25/2020 07/21/2020 9/74/1638 05/25/3644  Systolic BP 803 212 248 250 037 048 889  Diastolic BP 75 68 72 71 69 80 71  Wt. (Lbs) 169.12 169 169.8 169.8 163 167 159.8  BMI 29.03 29.01 29.15 29.15 27.98 28.67 27.43   No flowsheet data found.  Updated lab needed

## 2021-03-30 ENCOUNTER — Ambulatory Visit (INDEPENDENT_AMBULATORY_CARE_PROVIDER_SITE_OTHER): Payer: Medicare HMO | Admitting: Licensed Clinical Social Worker

## 2021-03-30 DIAGNOSIS — Z91199 Patient's noncompliance with other medical treatment and regimen due to unspecified reason: Secondary | ICD-10-CM

## 2021-03-30 NOTE — Progress Notes (Signed)
Left message encouraging call back

## 2021-03-30 NOTE — BH Specialist Note (Signed)
Left message encouraging contact 

## 2021-04-05 ENCOUNTER — Ambulatory Visit (INDEPENDENT_AMBULATORY_CARE_PROVIDER_SITE_OTHER): Payer: Medicare HMO

## 2021-04-05 ENCOUNTER — Ambulatory Visit (INDEPENDENT_AMBULATORY_CARE_PROVIDER_SITE_OTHER): Payer: Medicare HMO | Admitting: *Deleted

## 2021-04-05 ENCOUNTER — Telehealth: Payer: Self-pay | Admitting: Pharmacist

## 2021-04-05 ENCOUNTER — Telehealth: Payer: Medicare HMO

## 2021-04-05 VITALS — BP 142/68 | HR 68 | Ht 64.0 in | Wt 168.6 lb

## 2021-04-05 DIAGNOSIS — I350 Nonrheumatic aortic (valve) stenosis: Secondary | ICD-10-CM | POA: Diagnosis not present

## 2021-04-05 DIAGNOSIS — I10 Essential (primary) hypertension: Secondary | ICD-10-CM | POA: Diagnosis not present

## 2021-04-05 LAB — ECHOCARDIOGRAM COMPLETE
AR max vel: 0.81 cm2
AV Area VTI: 0.89 cm2
AV Area mean vel: 0.82 cm2
AV Mean grad: 35 mmHg
AV Peak grad: 51.8 mmHg
AV Vena cont: 0.33 cm
Ao pk vel: 3.6 m/s
Area-P 1/2: 2.83 cm2
Calc EF: 71.2 %
P 1/2 time: 398 msec
S' Lateral: 2.78 cm
Single Plane A2C EF: 75.6 %
Single Plane A4C EF: 67.6 %

## 2021-04-05 NOTE — Telephone Encounter (Signed)
°  Care Management   Follow Up Note  04/05/2021 Name: JADIA CAPERS MRN: [id number] DOB: January 26, 1941  Referred by: Fayrene Helper, MD Reason for referral : Chronic Care Management (Unsuccessful Telephone Outreach)  An unsuccessful telephone outreach was attempted today. The patient was referred to the case management team for assistance with care management and care coordination.   Follow Up Plan: The care management team will reach out to the patient again over the next 7 days.   Kennon Holter, PharmD, [place], CPP Clinical Pharmacist Practitioner [facility] [phone]

## 2021-04-05 NOTE — Progress Notes (Signed)
Patient came in the office today as a nurse visit to have her BP checked. Medications reviewed. Vitals done. Patient denies any symptoms at this time. Routed to provider for review.
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
## 2021-04-15 ENCOUNTER — Telehealth: Payer: Self-pay

## 2021-04-15 NOTE — Telephone Encounter (Signed)
Please advise 

## 2021-04-15 NOTE — Telephone Encounter (Signed)
Patient called having 2 days of swelling in feet swollen very bad, can't put shoes on. Is it okay for patient take 2 fluid pills or can a stronger medicine be called in.  Pharmacy:  [facility]

## 2021-04-18 ENCOUNTER — Other Ambulatory Visit: Payer: Self-pay | Admitting: Family Medicine

## 2021-04-18 MED ORDER — FUROSEMIDE 20 MG PO TABS: ORAL_TABLET | ORAL | 0 refills | Status: DC

## 2021-04-18 MED ORDER — POTASSIUM CHLORIDE CRYS ER 20 MEQ PO TBCR: EXTENDED_RELEASE_TABLET | ORAL | 0 refills | Status: DC

## 2021-04-18 NOTE — Progress Notes (Signed)
Klor con

## 2021-04-20 NOTE — Telephone Encounter (Signed)
Patient aware.

## 2021-04-21 ENCOUNTER — Telehealth: Payer: Self-pay | Admitting: Cardiology

## 2021-04-21 NOTE — Telephone Encounter (Signed)
Patient was returning a call to discuss Echo results  ?

## 2021-04-21 NOTE — Telephone Encounter (Signed)
Andrea Lenis, MD  ?04/18/2021  1:43 PM EST   ?  ?Aortic valve remains modearte to severe, continue to monitor at this time. Needs to report to [place] any new symptoms of SOB or chest pain ?  ?Zandra Abts MD  ? ?

## 2021-04-21 NOTE — Telephone Encounter (Signed)
Patient notified and verbalized understanding of results. Pt to call our office with any new symptoms of SOB/CP ?

## 2021-04-27 ENCOUNTER — Telehealth (INDEPENDENT_AMBULATORY_CARE_PROVIDER_SITE_OTHER): Payer: Medicare HMO | Admitting: Licensed Clinical Social Worker

## 2021-04-27 DIAGNOSIS — F324 Major depressive disorder, single episode, in partial remission: Secondary | ICD-10-CM

## 2021-04-27 NOTE — Telephone Encounter (Signed)
Left message encouraging contact 

## 2021-04-28 ENCOUNTER — Ambulatory Visit: Payer: Medicare HMO | Admitting: Rheumatology

## 2021-04-28 DIAGNOSIS — Z8639 Personal history of other endocrine, nutritional and metabolic disease: Secondary | ICD-10-CM

## 2021-04-28 DIAGNOSIS — Z8659 Personal history of other mental and behavioral disorders: Secondary | ICD-10-CM

## 2021-04-28 DIAGNOSIS — M7062 Trochanteric bursitis, left hip: Secondary | ICD-10-CM

## 2021-04-28 DIAGNOSIS — R5383 Other fatigue: Secondary | ICD-10-CM

## 2021-04-28 DIAGNOSIS — Z8679 Personal history of other diseases of the circulatory system: Secondary | ICD-10-CM

## 2021-04-28 DIAGNOSIS — M170 Bilateral primary osteoarthritis of knee: Secondary | ICD-10-CM

## 2021-04-28 DIAGNOSIS — M797 Fibromyalgia: Secondary | ICD-10-CM

## 2021-04-28 DIAGNOSIS — M62838 Other muscle spasm: Secondary | ICD-10-CM

## 2021-04-28 DIAGNOSIS — M19041 Primary osteoarthritis, right hand: Secondary | ICD-10-CM

## 2021-04-28 DIAGNOSIS — M5136 Other intervertebral disc degeneration, lumbar region: Secondary | ICD-10-CM

## 2021-04-28 DIAGNOSIS — M19071 Primary osteoarthritis, right ankle and foot: Secondary | ICD-10-CM

## 2021-04-28 DIAGNOSIS — M542 Cervicalgia: Secondary | ICD-10-CM

## 2021-04-28 DIAGNOSIS — E559 Vitamin D deficiency, unspecified: Secondary | ICD-10-CM

## 2021-04-28 DIAGNOSIS — M8589 Other specified disorders of bone density and structure, multiple sites: Secondary | ICD-10-CM

## 2021-05-06 NOTE — Telephone Encounter (Signed)
Rescheduled with RNCM  ? ?Laverda Sorenson  ?Care Guide, Embedded Care Coordination ?[facility]  ?Direct Dial: [phone] ? ?

## 2021-05-19 ENCOUNTER — Encounter: Payer: Self-pay | Admitting: Rheumatology

## 2021-05-19 ENCOUNTER — Ambulatory Visit (INDEPENDENT_AMBULATORY_CARE_PROVIDER_SITE_OTHER): Payer: Medicare HMO | Admitting: Rheumatology

## 2021-05-19 VITALS — BP 150/72 | HR 76 | Resp 16 | Ht 64.0 in | Wt 168.0 lb

## 2021-05-19 DIAGNOSIS — M62838 Other muscle spasm: Secondary | ICD-10-CM

## 2021-05-19 DIAGNOSIS — Z8639 Personal history of other endocrine, nutritional and metabolic disease: Secondary | ICD-10-CM

## 2021-05-19 DIAGNOSIS — Z8679 Personal history of other diseases of the circulatory system: Secondary | ICD-10-CM

## 2021-05-19 DIAGNOSIS — M170 Bilateral primary osteoarthritis of knee: Secondary | ICD-10-CM

## 2021-05-19 DIAGNOSIS — M5136 Other intervertebral disc degeneration, lumbar region: Secondary | ICD-10-CM | POA: Diagnosis not present

## 2021-05-19 DIAGNOSIS — M5030 Other cervical disc degeneration, unspecified cervical region: Secondary | ICD-10-CM | POA: Diagnosis not present

## 2021-05-19 DIAGNOSIS — M19042 Primary osteoarthritis, left hand: Secondary | ICD-10-CM

## 2021-05-19 DIAGNOSIS — R5383 Other fatigue: Secondary | ICD-10-CM | POA: Diagnosis not present

## 2021-05-19 DIAGNOSIS — M19071 Primary osteoarthritis, right ankle and foot: Secondary | ICD-10-CM | POA: Diagnosis not present

## 2021-05-19 DIAGNOSIS — E559 Vitamin D deficiency, unspecified: Secondary | ICD-10-CM

## 2021-05-19 DIAGNOSIS — M8589 Other specified disorders of bone density and structure, multiple sites: Secondary | ICD-10-CM

## 2021-05-19 DIAGNOSIS — M797 Fibromyalgia: Secondary | ICD-10-CM

## 2021-05-19 DIAGNOSIS — M19072 Primary osteoarthritis, left ankle and foot: Secondary | ICD-10-CM

## 2021-05-19 DIAGNOSIS — Z8659 Personal history of other mental and behavioral disorders: Secondary | ICD-10-CM

## 2021-05-19 DIAGNOSIS — M19041 Primary osteoarthritis, right hand: Secondary | ICD-10-CM | POA: Diagnosis not present
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
## 2021-05-19 NOTE — Patient Instructions (Addendum)
Hand Exercises ?Hand exercises can be helpful for almost anyone. These exercises can strengthen the hands, improve flexibility and movement, and increase blood flow to the hands. These results can make work and daily tasks easier. ?Hand exercises can be especially helpful for people who have joint pain from arthritis or have nerve damage from overuse (carpal tunnel syndrome). ?These exercises can also help people who have injured a hand. ?Exercises ?Most of these hand exercises are gentle stretching and motion exercises. It is usually safe to do them often throughout the day. Warming up your hands before exercise may help to reduce stiffness. You can do this with gentle massage or by placing your hands in warm water for 10-15 minutes. ?It is normal to feel some stretching, pulling, tightness, or mild discomfort as you begin new exercises. This will gradually improve. Stop an exercise right away if you feel sudden, severe pain or your pain gets worse. Ask your health care provider which exercises are best for you. ?Knuckle bend or "claw" fist ? ?Stand or sit with your arm, hand, and all five fingers pointed straight up. Make sure to keep your wrist straight during the exercise. ?Gently bend your fingers down toward your palm until the tips of your fingers are touching the top of your palm. Keep your big knuckle straight and just bend the small knuckles in your fingers. ?Hold this position for __________ seconds. ?Straighten (extend) your fingers back to the starting position. ?Repeat this exercise 5-10 times with each hand. ?Full finger fist ? ?Stand or sit with your arm, hand, and all five fingers pointed straight up. Make sure to keep your wrist straight during the exercise. ?Gently bend your fingers into your palm until the tips of your fingers are touching the middle of your palm. ?Hold this position for __________ seconds. ?Extend your fingers back to the starting position, stretching every joint fully. ?Repeat  this exercise 5-10 times with each hand. ?Straight fist ?Stand or sit with your arm, hand, and all five fingers pointed straight up. Make sure to keep your wrist straight during the exercise. ?Gently bend your fingers at the big knuckle, where your fingers meet your hand, and the middle knuckle. Keep the knuckle at the tips of your fingers straight and try to touch the bottom of your palm. ?Hold this position for __________ seconds. ?Extend your fingers back to the starting position, stretching every joint fully. ?Repeat this exercise 5-10 times with each hand. ?Tabletop ? ?Stand or sit with your arm, hand, and all five fingers pointed straight up. Make sure to keep your wrist straight during the exercise. ?Gently bend your fingers at the big knuckle, where your fingers meet your hand, as far down as you can while keeping the small knuckles in your fingers straight. Think of forming a tabletop with your fingers. ?Hold this position for __________ seconds. ?Extend your fingers back to the starting position, stretching every joint fully. ?Repeat this exercise 5-10 times with each hand. ?Finger spread ? ?Place your hand flat on a table with your palm facing down. Make sure your wrist stays straight as you do this exercise. ?Spread your fingers and thumb apart from each other as far as you can until you feel a gentle stretch. Hold this position for __________ seconds. ?Bring your fingers and thumb tight together again. Hold this position for __________ seconds. ?Repeat this exercise 5-10 times with each hand. ?Making circles ? ?Stand or sit with your arm, hand, and all five fingers pointed   straight up. Make sure to keep your wrist straight during the exercise. ?Make a circle by touching the tip of your thumb to the tip of your index finger. ?Hold for __________ seconds. Then open your hand wide. ?Repeat this motion with your thumb and each finger on your hand. ?Repeat this exercise 5-10 times with each hand. ?Thumb  motion ? ?Sit with your forearm resting on a table and your wrist straight. Your thumb should be facing up toward the ceiling. Keep your fingers relaxed as you move your thumb. ?Lift your thumb up as high as you can toward the ceiling. Hold for __________ seconds. ?Bend your thumb across your palm as far as you can, reaching the tip of your thumb for the small finger (pinkie) side of your palm. Hold for __________ seconds. ?Repeat this exercise 5-10 times with each hand. ?Grip strengthening ? ?Hold a stress ball or other soft ball in the middle of your hand. ?Slowly increase the pressure, squeezing the ball as much as you can without causing pain. Think of bringing the tips of your fingers into the middle of your palm. All of your finger joints should bend when doing this exercise. ?Hold your squeeze for __________ seconds, then relax. ?Repeat this exercise 5-10 times with each hand. ?Contact a health care provider if: ?Your hand pain or discomfort gets much worse when you do an exercise. ?Your hand pain or discomfort does not improve within 2 hours after you exercise. ?If you have any of these problems, stop doing these exercises right away. Do not do them again unless your health care provider says that you can. ?Get help right away if: ?You develop sudden, severe hand pain or swelling. If this happens, stop doing these exercises right away. Do not do them again unless your health care provider says that you can. ?This information is not intended to replace advice given to you by your health care provider. Make sure you discuss any questions you have with your health care provider. ?Document Revised: 05/27/2020 Document Reviewed: 05/27/2020 ?Elsevier Patient Education ? 2022 [employer] ?Knee Exercises ?Ask your health care provider which exercises are safe for you. Do exercises exactly as told by your health care provider and adjust them as directed. It is normal to feel mild stretching, pulling, tightness, or  discomfort as you do these exercises. Stop right away if you feel sudden pain or your pain gets worse. Do not begin these exercises until told by your health care provider. ?Stretching and range-of-motion exercises ?These exercises warm up your muscles and joints and improve the movement and flexibility of your knee. These exercises also help to relieve pain and swelling. ?Knee extension, prone ? ?Lie on your abdomen (prone position) on a bed. ?Place your left / right knee just beyond the edge of the surface so your knee is not on the bed. You can put a towel under your left / right thigh just above your kneecap for comfort. ?Relax your leg muscles and allow gravity to straighten your knee (extension). You should feel a stretch behind your left / right knee. ?Hold this position for __________ seconds. ?Scoot up so your knee is supported between repetitions. ?Repeat __________ times. Complete this exercise __________ times a day. ?Knee flexion, active ? ?Lie on your back with both legs straight. If this causes back discomfort, bend your left / right knee so your foot is flat on the floor. ?Slowly slide your left / right heel back toward your buttocks. Stop when   you feel a gentle stretch in the front of your knee or thigh (flexion). ?Hold this position for __________ seconds. ?Slowly slide your left / right heel back to the starting position. ?Repeat __________ times. Complete this exercise __________ times a day. ?Quadriceps stretch, prone ? ?Lie on your abdomen on a firm surface, such as a bed or padded floor. ?Bend your left / right knee and hold your ankle. If you cannot reach your ankle or pant leg, loop a belt around your foot and grab the belt instead. ?Gently pull your heel toward your buttocks. Your knee should not slide out to the side. You should feel a stretch in the front of your thigh and knee (quadriceps). ?Hold this position for __________ seconds. ?Repeat __________ times. Complete this exercise  __________ times a day. ?Hamstring, supine ? ?Lie on your back (supine position). ?Loop a belt or towel over the ball of your left / right foot. The ball of your foot is on the walking surface, right under you

## 2021-05-20 ENCOUNTER — Other Ambulatory Visit: Payer: Self-pay | Admitting: Family Medicine

## 2021-05-20 DIAGNOSIS — E7849 Other hyperlipidemia: Secondary | ICD-10-CM

## 2021-05-26 ENCOUNTER — Telehealth: Payer: Medicare HMO

## 2021-05-26 ENCOUNTER — Telehealth: Payer: Self-pay | Admitting: *Deleted

## 2021-05-26 NOTE — Telephone Encounter (Signed)
?  Care Management  ? ?Follow Up Note ? ? ?05/26/2021 ?Name: Andrea Santiago MRN: [id number] DOB: 07/20/1940 ? ? ?Referred by: Fayrene Helper, MD ?Reason for referral : Chronic Care Management (HTN, HLD) ? ? ?An unsuccessful telephone outreach was attempted today. The patient was referred to the case management team for assistance with care management and care coordination.  ? ?Follow Up Plan: Telephone follow up appointment with care management team member scheduled for: upon care guide rescheduling. ? ?Jacqlyn Larsen RNC, BSN ?RN Case Manager ?[facility] ?[phone] ? ? ?

## 2021-05-31 ENCOUNTER — Encounter: Payer: Self-pay | Admitting: Neurology

## 2021-05-31 ENCOUNTER — Ambulatory Visit (INDEPENDENT_AMBULATORY_CARE_PROVIDER_SITE_OTHER): Payer: Medicare HMO | Admitting: Neurology

## 2021-05-31 VITALS — BP 159/81 | HR 73 | Ht 64.0 in | Wt 169.9 lb

## 2021-05-31 DIAGNOSIS — M79672 Pain in left foot: Secondary | ICD-10-CM | POA: Diagnosis not present

## 2021-05-31 DIAGNOSIS — R202 Paresthesia of skin: Secondary | ICD-10-CM | POA: Diagnosis not present

## 2021-05-31 DIAGNOSIS — R7303 Prediabetes: Secondary | ICD-10-CM

## 2021-05-31 DIAGNOSIS — M79671 Pain in right foot: Secondary | ICD-10-CM

## 2021-05-31 DIAGNOSIS — R200 Anesthesia of skin: Secondary | ICD-10-CM | POA: Diagnosis not present

## 2021-05-31 DIAGNOSIS — Z79899 Other long term (current) drug therapy: Secondary | ICD-10-CM | POA: Diagnosis not present
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
## 2021-05-31 NOTE — Patient Instructions (Signed)
You may have a condition called peripheral neuropathy, i. e. nerve damage, but findings are mild. Unfortunately, as I mentioned, there is no specific treatment for most neuropathies. The most common cause for neuropathy is diabetes in this [place], in which case, tight glucose control is key.  Some studies suggest that obesity and pre-diabetes can also cause nerve damage even in the absence of a formal diagnosis of diabetes.   ? ?Other causes include thyroid disease, and some vitamin deficiencies. Certain medications such as chemotherapy agents and other chemicals or toxins including alcohol can cause neuropathy. There are some genetic conditions or hereditary neuropathies. Typically patients will report a family history of neuropathy in those conditions. There are cases associated with cancers and autoimmune conditions. Most neuropathies are progressive unless a root cause can be found and treated, which is rare, as I explained. For most neuropathies there is no actual cure or reversing of symptoms. Painful neuropathy can be difficult to treat symptomatically, but there are some medications available to ease the symptoms.  Thankfully, you have no severe pain symptoms at this time and can be monitored for symptoms.   ? ?I do believe that your symptoms got "unmasked" when you stopped the Cymbalta.  ?Please talk to Dr. Moshe Cipro about getting restarted on the Cymbalta.  ? ?We will avoid invasive testing for now, as explained.  ?Electrophysiologic testing with nerve conduction velocity studies and EMG (muscle testing) do not always pick up neuropathies that affect the smallest fibers. Other common tests include different type of blood work, and rarely, spinal fluid testing, and sometimes we resort to asking for a nerve and muscle biopsy. ?For now, as discussed, we will proceed with blood work today and call you with the test results. ? ?We may consider doing an EMG and nerve conduction velocity test, which is an  electrical nerve and muscle test, in the future.  ? ? ? ?

## 2021-06-02 ENCOUNTER — Telehealth: Payer: Self-pay | Admitting: *Deleted

## 2021-06-02 LAB — HGB A1C W/O EAG: Hgb A1c MFr Bld: 6.3 % — ABNORMAL HIGH (ref 4.8–5.6)

## 2021-06-02 LAB — MULTIPLE MYELOMA PANEL, SERUM
Albumin SerPl Elph-Mcnc: 3.7 g/dL (ref 2.9–4.4)
Albumin/Glob SerPl: 1.1 (ref 0.7–1.7)
Alpha 1: 0.3 g/dL (ref 0.0–0.4)
Alpha2 Glob SerPl Elph-Mcnc: 1 g/dL (ref 0.4–1.0)
B-Globulin SerPl Elph-Mcnc: 1.2 g/dL (ref 0.7–1.3)
Gamma Glob SerPl Elph-Mcnc: 1.2 g/dL (ref 0.4–1.8)
Globulin, Total: 3.7 g/dL (ref 2.2–3.9)
IgA/Immunoglobulin A, Serum: 317 mg/dL (ref 64–422)
IgG (Immunoglobin G), Serum: 1009 mg/dL (ref 586–1602)
IgM (Immunoglobulin M), Srm: 106 mg/dL (ref 26–217)
Total Protein: 7.4 g/dL (ref 6.0–8.5)

## 2021-06-02 LAB — VITAMIN B6: Vitamin B6: 82.5 ug/L — ABNORMAL HIGH (ref 3.4–65.2)

## 2021-06-02 LAB — B12 AND FOLATE PANEL
Folate: >20 ng/mL (ref >3.0)
Vitamin B-12: 420 pg/mL (ref 232–1245)

## 2021-06-02 LAB — VITAMIN D 25 HYDROXY (VIT D DEFICIENCY, FRACTURES): Vit D, 25-Hydroxy: 36.4 ng/mL (ref 30.0–100.0)

## 2021-06-02 LAB — CK: Total CK: 151 U/L (ref 26–161)

## 2021-06-02 NOTE — Telephone Encounter (Signed)
-----   Message from Star Age, MD sent at 06/02/2021  4:38 PM EDT ----- ?As a follow-up to the blood test results, please call her back regarding her vitamin B6 level, which was elevated above the recommended range.  If she is taking a vitamin B6 supplement, she can stop taking it.  Too much of vitamin B6 can also cause side effects such as tingling and nerve damage in some instances.  We do not recommend supplementing it indefinitely.  ?

## 2021-06-02 NOTE — Telephone Encounter (Signed)
Spoke to patient gave her lab results gave DrAthars recommendation and make sure follows up with her PCP  Pt thanked me for calling  ?

## 2021-06-02 NOTE — Telephone Encounter (Signed)
Spoke with patient gave lab results and informed patient to make sure she follow up with her PCP Pt thanked me for calling  ?

## 2021-06-02 NOTE — Telephone Encounter (Signed)
-----   Message from Star Age, MD sent at 06/02/2021 12:36 PM EDT ----- ?Please call patient and advise her that her blood work showed an A1c which is a diabetes marker in the prediabetes range.  Other labs were normal, 1 test is pending which is the vitamin B6, we will update if it is abnormal.  As discussed, she can follow-up with her primary care at this time. ?

## 2021-06-03 DIAGNOSIS — S0502XA Injury of conjunctiva and corneal abrasion without foreign body, left eye, initial encounter: Secondary | ICD-10-CM | POA: Diagnosis not present

## 2021-06-03 DIAGNOSIS — X58XXXA Exposure to other specified factors, initial encounter: Secondary | ICD-10-CM | POA: Diagnosis not present

## 2021-06-06 DIAGNOSIS — S0502XA Injury of conjunctiva and corneal abrasion without foreign body, left eye, initial encounter: Secondary | ICD-10-CM | POA: Diagnosis not present

## 2021-06-07 ENCOUNTER — Other Ambulatory Visit: Payer: Self-pay | Admitting: Family Medicine

## 2021-06-07 DIAGNOSIS — S0502XD Injury of conjunctiva and corneal abrasion without foreign body, left eye, subsequent encounter: Secondary | ICD-10-CM | POA: Diagnosis not present

## 2021-06-16 ENCOUNTER — Ambulatory Visit (INDEPENDENT_AMBULATORY_CARE_PROVIDER_SITE_OTHER): Payer: Medicare HMO | Admitting: Family Medicine

## 2021-06-16 ENCOUNTER — Encounter: Payer: Medicare HMO | Admitting: Family Medicine

## 2021-06-16 DIAGNOSIS — G4489 Other headache syndrome: Secondary | ICD-10-CM | POA: Diagnosis not present

## 2021-06-16 MED ORDER — ONDANSETRON HCL 4 MG PO TABS: 4.0000 mg | ORAL_TABLET | Freq: Three times a day (TID) | ORAL | 0 refills | Status: DC | PRN

## 2021-06-16 MED ORDER — BUTALBITAL-APAP-CAFFEINE 50-325-40 MG PO TABS: ORAL_TABLET | ORAL | 0 refills | Status: DC

## 2021-06-16 NOTE — Progress Notes (Signed)
Virtual Visit via Telephone Note ? ?I connected with JANINA TRAFTON on 06/16/21 at  1:00 PM EDT by telephone and verified that I am speaking with the correct person using two identifiers. ? ?Location: ?Patient: home ?Provider: office ?  ?I discussed the limitations, risks, security and privacy concerns of performing an evaluation and management service by telephone and the availability of in person appointments. I also discussed with the patient that there may be a patient responsible charge related to this service. The patient expressed understanding and agreed to proceed. ? ? ?History of Present Illness: ?Generalized headache rated at a 10 woke upo with it, pounding and with nausea, no vomit, no new weakness or numbness, has had similar headache, no trigger identified ?  ?Observations/Objective: ?There were no vitals taken for this visit. ?Good communication with no confusion and intact memory. ?Alert and oriented x 3 ?No signs of respiratory distress during speech ? ? ?Assessment and Plan: ? ? ?Follow Up Instructions: ? ?  ?I discussed the assessment and treatment plan with the patient. The patient was provided an opportunity to ask questions and all were answered. The patient agreed with the plan and demonstrated an understanding of the instructions. ?  ?The patient was advised to call back or seek an in-person evaluation if the symptoms worsen or if the condition fails to improve as anticipated. ? ?I provided 11 minutes of non-face-to-face time during this encounter. ? ? ?Tula Nakayama, MD ? ?

## 2021-06-16 NOTE — Patient Instructions (Signed)
F/U as before, call if you need me sooner ? ?Medications are sent in for your headache and nausea, I hope that you will get relief soon ? ?Thanks for choosing [facility], we consider it a privelige to serve you. ? ?

## 2021-06-17 ENCOUNTER — Telehealth: Payer: Self-pay | Admitting: *Deleted

## 2021-06-17 ENCOUNTER — Encounter: Payer: Self-pay | Admitting: Family Medicine

## 2021-06-17 NOTE — Assessment & Plan Note (Signed)
Acute headache with nausea, likely migraine, limited fioricet and zofran prescribed. Needs to go to the ED for worsening symptoms ?

## 2021-06-17 NOTE — Chronic Care Management (AMB) (Signed)
?  Care Management  ? ?Note ? ?06/17/2021 ?Name: Andrea Santiago MRN: [id number] DOB: 1940-03-13 ? ?Andrea Santiago is a 81 y.o. year old female who is a primary care patient of Fayrene Helper, MD and is actively engaged with the care management team. I reached out to Tennis Must by phone today to assist with re-scheduling a follow up visit with the RN Case Manager ? ?Follow up plan: ?Unsuccessful telephone outreach attempt made. A HIPAA compliant phone message was left for the patient providing contact information and requesting a return call.  ?The care management team will reach out to the patient again over the next 7 days.  ?If patient returns call to provider office, please advise to call [place] at [phone]. ? ?Laverda Sorenson  ?Care Guide, Embedded Care Coordination ?[place]  Care Management  ?Direct Dial: [phone] ? ?

## 2021-06-18 ENCOUNTER — Other Ambulatory Visit: Payer: Self-pay | Admitting: Family Medicine

## 2021-06-20 ENCOUNTER — Other Ambulatory Visit: Payer: Self-pay | Admitting: Family Medicine

## 2021-06-20 DIAGNOSIS — E7849 Other hyperlipidemia: Secondary | ICD-10-CM

## 2021-06-20 DIAGNOSIS — S0502XD Injury of conjunctiva and corneal abrasion without foreign body, left eye, subsequent encounter: Secondary | ICD-10-CM | POA: Diagnosis not present

## 2021-06-23 DIAGNOSIS — S0502XD Injury of conjunctiva and corneal abrasion without foreign body, left eye, subsequent encounter: Secondary | ICD-10-CM | POA: Diagnosis not present

## 2021-06-24 NOTE — Chronic Care Management (AMB) (Signed)
?  Chronic Care Management ?Note ? ?06/24/2021 ?Name: Andrea Santiago MRN: [id number] DOB: 06-10-1940 ? ?Andrea Santiago is a 81 y.o. year old female who is a primary care patient of Fayrene Helper, MD and is actively engaged with the care management team. I reached out to Tennis Must by phone today to assist with re-scheduling a follow up visit with the RN Case Manager ? ?Follow up plan: ?Unsuccessful telephone outreach attempt made. The care management team will reach out to the patient again over the next 7 days. If patient returns call to provider office, please advise to call [place] at [phone]. ? ?Laverda Sorenson  ?Care Guide, Embedded Care Coordination ?[place]  Care Management  ?Direct Dial: [phone] ? ?

## 2021-07-01 ENCOUNTER — Encounter: Payer: Medicare HMO | Admitting: Family Medicine

## 2021-07-04 DIAGNOSIS — S0502XD Injury of conjunctiva and corneal abrasion without foreign body, left eye, subsequent encounter: Secondary | ICD-10-CM | POA: Diagnosis not present

## 2021-07-04 NOTE — Chronic Care Management (AMB) (Signed)
?  Chronic Care Management ?Note ? ?07/04/2021 ?Name: [name] MRN: [id number] DOB: 1940/04/20 ? ?Andrea Santiago is a 81 y.o. year old female who is a primary care patient of Fayrene Helper, MD and is actively engaged with the care management team. I reached out to Tennis Must by phone today to assist with re-scheduling a follow up visit with the RN Case Manager ? ?Follow up plan: ?A third unsuccessful telephone outreach attempt made. A HIPAA compliant phone message was left for the patient providing contact information and requesting a return call. Unable to make contact on outreach attempts x 3. PCP Dr. Moshe Cipro notified via routed documentation in medical record. We have been unable to make contact with the patient for follow up. The care management team is available to follow up with the patient after provider conversation with the patient regarding recommendation for care management engagement and subsequent re-referral to the care management team.  ? ?Laverda Sorenson  ?Care Guide, Embedded Care Coordination ?[place]  Care Management  ?Direct Dial: [phone] ? ?

## 2021-07-13 ENCOUNTER — Ambulatory Visit (INDEPENDENT_AMBULATORY_CARE_PROVIDER_SITE_OTHER): Payer: Medicare HMO | Admitting: Family Medicine

## 2021-07-13 ENCOUNTER — Encounter: Payer: Self-pay | Admitting: Family Medicine

## 2021-07-13 VITALS — BP 123/73 | HR 63 | Resp 16 | Ht 64.0 in | Wt 169.0 lb

## 2021-07-13 DIAGNOSIS — R7303 Prediabetes: Secondary | ICD-10-CM

## 2021-07-13 DIAGNOSIS — E559 Vitamin D deficiency, unspecified: Secondary | ICD-10-CM | POA: Diagnosis not present

## 2021-07-13 DIAGNOSIS — I10 Essential (primary) hypertension: Secondary | ICD-10-CM

## 2021-07-13 DIAGNOSIS — Z0001 Encounter for general adult medical examination with abnormal findings: Secondary | ICD-10-CM

## 2021-07-13 DIAGNOSIS — E7849 Other hyperlipidemia: Secondary | ICD-10-CM

## 2021-07-13 MED ORDER — BUTALBITAL-APAP-CAFFEINE 50-325-40 MG PO TABS: ORAL_TABLET | ORAL | 2 refills | Status: DC

## 2021-07-13 NOTE — Patient Instructions (Addendum)
F/U in 5 months, call if you need me sooner  Fasting lipid cmp and and EGFR, cBC, TSH, hBA1c and vit d 1 week before follow up  Please get shingrix #2 and TdAP at your pharmacy  Thanks for choosing [facility], we consider it a privelige to serve you.

## 2021-07-17 ENCOUNTER — Encounter: Payer: Self-pay | Admitting: Family Medicine

## 2021-07-17 DIAGNOSIS — Z0001 Encounter for general adult medical examination with abnormal findings: Secondary | ICD-10-CM | POA: Insufficient documentation

## 2021-07-17 NOTE — Progress Notes (Signed)
    Andrea Santiago     MRN: [id number]      DOB: 02/01/1941  HPI: Patient is in for annual physical exam. No other health concerns are expressed or addressed at the visit. Recent labs,  are reviewed. Immunization is reviewed , and  updated if needed.   PE: BP 123/73   Pulse 63   Resp 16   Ht '5\' 4"'$  (1.626 m)   Wt 169 lb (76.7 kg)   SpO2 96%   BMI 29.01 kg/m   Pleasant  female, alert and oriented x 3, in no cardio-pulmonary distress. Afebrile. HEENT No facial trauma or asymetry. Sinuses non tender.  Extra occullar muscles intact.. External ears normal, . Neck: decreased ROM, no adenopathy,JVD or thyromegaly.No bruits.  Chest: Clear to ascultation bilaterally.No crackles or wheezes. Non tender to palpation    Cardiovascular system; Heart sounds normal,  S1 and  S2 ,no S3.  Systolic murmur, or thrill. Apical beat not displaced Peripheral pulses normal.  Abdomen: Soft, non tender.     Musculoskeletal exam: Decreased  ROM of spine, hips , shoulders and knees. No deformity ,swelling or crepitus noted. No muscle wasting or atrophy.   Neurologic: Cranial nerves 2 to 12 intact. Power, tone ,sensation  normal throughout.  disturbance in gait. No tremor.  Skin: Intact, no ulceration, erythema , scaling or rash noted. Pigmentation normal throughout  Psych; Normal mood and affect. Judgement and concentration normal   Assessment & Plan:  Encounter for Medicare annual examination with abnormal findings Annual exam as documented. Counseling done  re healthy lifestyle involving commitment to 150 minutes exercise per week, heart healthy diet, and attaining healthy weight.The importance of adequate sleep also discussed. Regular seat belt use and home safety, is also discussed. Changes in health habits are decided on by the patient with goals and time frames  set for achieving them. Immunization and cancer screening needs are specifically addressed at this visit.

## 2021-07-17 NOTE — Assessment & Plan Note (Signed)

## 2021-07-26 ENCOUNTER — Telehealth: Payer: Self-pay | Admitting: *Deleted

## 2021-07-26 NOTE — Telephone Encounter (Signed)
Received from [facility] (needing other medical diagnosis) for coverage of vit D, HA1C.  Added R73.03 from ofv note.  And longterm medication therapy Z79.899.

## 2021-07-28 ENCOUNTER — Encounter: Payer: Self-pay | Admitting: Family Medicine

## 2021-07-28 ENCOUNTER — Ambulatory Visit (INDEPENDENT_AMBULATORY_CARE_PROVIDER_SITE_OTHER): Payer: Medicare HMO | Admitting: Family Medicine

## 2021-07-28 VITALS — BP 130/72 | HR 73 | Ht 64.0 in | Wt 165.1 lb

## 2021-07-28 DIAGNOSIS — R059 Cough, unspecified: Secondary | ICD-10-CM | POA: Insufficient documentation

## 2021-07-28 DIAGNOSIS — J0110 Acute frontal sinusitis, unspecified: Secondary | ICD-10-CM

## 2021-07-28 DIAGNOSIS — R051 Acute cough: Secondary | ICD-10-CM | POA: Diagnosis not present

## 2021-07-28 DIAGNOSIS — J019 Acute sinusitis, unspecified: Secondary | ICD-10-CM | POA: Insufficient documentation

## 2021-07-28 MED ORDER — BENZONATATE 100 MG PO CAPS: 100.0000 mg | ORAL_CAPSULE | Freq: Two times a day (BID) | ORAL | 0 refills | Status: DC | PRN

## 2021-07-28 MED ORDER — AZITHROMYCIN 250 MG PO TABS: ORAL_TABLET | ORAL | 0 refills | Status: DC

## 2021-07-28 NOTE — Patient Instructions (Signed)
F/U as before, call if you need me sooner  Antibiotic and decongestants are prescribed  You are treated for acute sinusitis and cough  Thanks for choosing [facility], we consider it a privelige to serve you.

## 2021-08-01 ENCOUNTER — Encounter: Payer: Self-pay | Admitting: Cardiology

## 2021-08-01 ENCOUNTER — Telehealth: Payer: Self-pay | Admitting: Family Medicine

## 2021-08-01 ENCOUNTER — Other Ambulatory Visit: Payer: Self-pay | Admitting: Family Medicine

## 2021-08-01 ENCOUNTER — Ambulatory Visit (INDEPENDENT_AMBULATORY_CARE_PROVIDER_SITE_OTHER): Payer: Medicare HMO | Admitting: Cardiology

## 2021-08-01 VITALS — BP 122/66 | HR 76 | Ht 64.0 in | Wt 165.0 lb

## 2021-08-01 DIAGNOSIS — I10 Essential (primary) hypertension: Secondary | ICD-10-CM | POA: Diagnosis not present

## 2021-08-01 DIAGNOSIS — I350 Nonrheumatic aortic (valve) stenosis: Secondary | ICD-10-CM

## 2021-08-01 DIAGNOSIS — E782 Mixed hyperlipidemia: Secondary | ICD-10-CM

## 2021-08-01 MED ORDER — PREDNISONE 5 MG PO TABS: 5.0000 mg | ORAL_TABLET | Freq: Two times a day (BID) | ORAL | 0 refills | Status: AC

## 2021-08-01 NOTE — Patient Instructions (Signed)
Medication Instructions:  Continue all current medications.  Labwork: none  Testing/Procedures: Your physician has requested that you have an echocardiogram. Echocardiography is a painless test that uses sound waves to create images of your heart. It provides your doctor with information about the size and shape of your heart and how well your heart's chambers and valves are working. This procedure takes approximately one hour. There are no restrictions for this procedure - DUE THIS AUGUST  Follow-Up: 6 months   Any Other Special Instructions Will Be Listed Below (If Applicable).   If you need a refill on your cardiac medications before your next appointment, please call your pharmacy.

## 2021-08-01 NOTE — Telephone Encounter (Signed)
LMTRC

## 2021-08-01 NOTE — Assessment & Plan Note (Signed)
Tessalon perles prescribed 

## 2021-08-01 NOTE — Telephone Encounter (Signed)
Patient called in regard to rash .  Patient has hash on both arms , back foot. itchy  Last visit on 6/8.  Patient thinks it may be coming from antibiotic. Wants to see if can get something sent in to help rash also  Wants a call back in regard

## 2021-08-01 NOTE — Progress Notes (Signed)
   Andrea Santiago     MRN: [id number]      DOB: Oct 30, 1940   HPI Andrea Santiago is here with a 4 day h/o sinus drainage and cough, sputum is generally clear and scant, drainage is thickening and is yellow/ green at times C/o facial pressure over forehead . Denies wheeze or significant shortness of breath  ROS Denies recent fever or chills.  Denies chest pains, palpitations and leg swelling Denies abdominal pain, nausea, vomiting,diarrhea or constipation.   Denies dysuria, frequency, hesitancy or incontinence. Chronic  joint pain, swelling and limitation in mobility. Denies headaches, seizures, numbness, or tingling. Denies uncontrolled depression, anxiety or insomnia. Denies skin break down or rash.   PE  BP 130/72   Pulse 73   Ht '5\' 4"'$  (1.626 m)   Wt 165 lb 1.3 oz (74.9 kg)   SpO2 96%   BMI 28.34 kg/m   Patient alert and oriented and in no cardiopulmonary distress.  HEENT: No facial asymmetry, EOMI,     Neck supple .frontal sinus tenderness  Chest: Clear to auscultation bilaterally.  CVS: S1, S2 no murmurs, no S3.Regular rate.  ABD: Soft non tender.   Ext: No edema  MS: decreased ROM spine, shoulders, hips and knees.  Skin: Intact, no ulcerations or rash noted.  Psych: Good eye contact, normal affect. Memory intact not anxious or depressed appearing.  CNS: CN 2-12 intact, power,  normal throughout.no focal deficits noted.   Assessment & Plan  Cough Tessalon perles prescribed  Acute sinusitis Z pack prescribed

## 2021-08-01 NOTE — Assessment & Plan Note (Signed)
Z pack prescribed 
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
## 2021-08-02 ENCOUNTER — Telehealth: Payer: Self-pay | Admitting: Family Medicine

## 2021-08-02 NOTE — Telephone Encounter (Signed)
Patient aware see other tele msg   

## 2021-08-02 NOTE — Telephone Encounter (Signed)
LMTRC

## 2021-08-02 NOTE — Telephone Encounter (Signed)
Pt returning call

## 2021-08-02 NOTE — Telephone Encounter (Signed)
lmtrc

## 2021-08-02 NOTE — Telephone Encounter (Signed)
Patient aware.

## 2021-08-16 ENCOUNTER — Ambulatory Visit (INDEPENDENT_AMBULATORY_CARE_PROVIDER_SITE_OTHER): Payer: Medicare HMO | Admitting: Family Medicine

## 2021-08-16 ENCOUNTER — Telehealth: Payer: Self-pay

## 2021-08-16 DIAGNOSIS — J0110 Acute frontal sinusitis, unspecified: Secondary | ICD-10-CM | POA: Diagnosis not present

## 2021-08-16 DIAGNOSIS — J209 Acute bronchitis, unspecified: Secondary | ICD-10-CM

## 2021-08-16 MED ORDER — DM-GUAIFENESIN ER 30-600 MG PO TB12: 1.0000 | ORAL_TABLET | Freq: Two times a day (BID) | ORAL | 0 refills | Status: DC

## 2021-08-16 MED ORDER — PROMETHAZINE-DM 6.25-15 MG/5ML PO SYRP: ORAL_SOLUTION | ORAL | 0 refills | Status: DC

## 2021-08-16 MED ORDER — DOXYCYCLINE HYCLATE 100 MG PO TABS: 100.0000 mg | ORAL_TABLET | Freq: Two times a day (BID) | ORAL | 0 refills | Status: DC

## 2021-08-16 NOTE — Progress Notes (Signed)
Virtual Visit via Video Note  I connected with Andrea Santiago on 08/16/21 at 11:40 AM EDT by a video enabled telemedicine application and verified that I am speaking with the correct person using two identifiers.  Location: Patient: home Provider: office   I discussed the limitations of evaluation and management by telemedicine and the availability of in person appointments. The patient expressed understanding and agreed to proceed.  History of Present Illness: 2 day h/o yellow nasal drainage, and cough productive of yellow sputum, no fever , chills, feels awful   Observations/Objective: There were no vitals taken for this visit. Good communication with no confusion and intact memory. Alert and oriented x 3 Cough and head congestion noted during interview   Assessment and Plan: Acute sinusitis Doxycycline and mucinex DM prescribed, saline nasal flushes twice daily   Acute bronchitis Doxycycline, phenergan DM and mucinex precribed, encouraged to push fluids   Follow Up Instructions:    I discussed the assessment and treatment plan with the patient. The patient was provided an opportunity to ask questions and all were answered. The patient agreed with the plan and demonstrated an understanding of the instructions.   The patient was advised to call back or seek an in-person evaluation if the symptoms worsen or if the condition fails to improve as anticipated.  I provided 12 minutes of non-face-to-face time during this encounter.   Tula Nakayama, MD

## 2021-08-16 NOTE — Telephone Encounter (Signed)
Patient called to let Dr Moshe Cipro know her covid test was negative

## 2021-08-24 ENCOUNTER — Other Ambulatory Visit: Payer: Self-pay | Admitting: Family Medicine

## 2021-08-29 ENCOUNTER — Telehealth: Payer: Self-pay | Admitting: Family Medicine

## 2021-08-29 ENCOUNTER — Encounter: Payer: Self-pay | Admitting: Family Medicine

## 2021-08-29 ENCOUNTER — Other Ambulatory Visit: Payer: Self-pay

## 2021-08-29 MED ORDER — MECLIZINE HCL 25 MG PO TABS
ORAL_TABLET | ORAL | 0 refills | Status: DC
Start: 2021-08-29 — End: 2022-05-07

## 2021-08-29 NOTE — Assessment & Plan Note (Signed)
Doxycycline, phenergan DM and mucinex precribed, encouraged to push fluids

## 2021-08-29 NOTE — Telephone Encounter (Signed)
Patient called and asked for Methlozine for inner ear problem.  I do not see that on her med list.  She said she has had this in the past.   This is the closest thing I see.   Meclizine HCl (Tab) ANTIVERT 25 MG TAKE 1 TABLET BY MOUTH 3 TIMES DAILY AS NEEDED FOR DIZZINESS.   It says for dizziness.   I asked her was it the same thing and she said she does not know.   Please verify prior to calling med.  I could not confirm correct med.  She wants it called into [facility].

## 2021-08-29 NOTE — Telephone Encounter (Signed)
Spoke with patient verified correct medication,[facility] [place] per patient request.

## 2021-08-29 NOTE — Assessment & Plan Note (Signed)
Doxycycline and mucinex DM prescribed, saline nasal flushes twice daily

## 2021-09-20 ENCOUNTER — Ambulatory Visit (INDEPENDENT_AMBULATORY_CARE_PROVIDER_SITE_OTHER): Payer: Medicare HMO

## 2021-09-20 DIAGNOSIS — I350 Nonrheumatic aortic (valve) stenosis: Secondary | ICD-10-CM

## 2021-09-20 LAB — ECHOCARDIOGRAM COMPLETE
AR max vel: 0.79 cm2
AV Area VTI: 0.85 cm2
AV Area mean vel: 0.79 cm2
AV Mean grad: 32 mmHg
AV Peak grad: 47.8 mmHg
Ao pk vel: 3.46 m/s
Area-P 1/2: 3.63 cm2
Calc EF: 65.6 %
MV M vel: 5.1 m/s
MV Peak grad: 103.8 mmHg
P 1/2 time: 598 msec
S' Lateral: 2.48 cm
Single Plane A2C EF: 67.1 %
Single Plane A4C EF: 65.6 %

## 2021-10-03 ENCOUNTER — Telehealth: Payer: Self-pay | Admitting: Cardiology

## 2021-10-03 NOTE — Telephone Encounter (Signed)
Patient return RN's call.

## 2021-10-04 NOTE — Telephone Encounter (Signed)
Left message to return call 

## 2021-10-04 NOTE — Telephone Encounter (Signed)
Laurine Blazer, LPN  1/88/6773  7:36 PM EDT Back to Top    Notified, copy to pcp.    Laurine Blazer, LPN  6/81/5947  0:76 PM EDT     Left message to return call.   Laurine Blazer, LPN  1/51/8343  7:35 PM EDT     Left message to return call.   Arnoldo Lenis, MD  09/26/2021  2:48 PM EDT     Echo shows heart pumping function is normal. Aortic valve is moderately to severely stiffened similar to prior study. Just something to continue to monitor at this time, if were to dvelop any new SOB or chest pains needs to let us know, otherwise maintain routine f/u     Zandra Abts MD

## 2021-11-17 ENCOUNTER — Ambulatory Visit (INDEPENDENT_AMBULATORY_CARE_PROVIDER_SITE_OTHER): Payer: Medicare HMO | Admitting: Nurse Practitioner

## 2021-11-17 ENCOUNTER — Encounter: Payer: Self-pay | Admitting: Nurse Practitioner

## 2021-11-17 DIAGNOSIS — Z0000 Encounter for general adult medical examination without abnormal findings: Secondary | ICD-10-CM | POA: Diagnosis not present
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
## 2021-11-17 NOTE — Patient Instructions (Signed)
  Andrea Santiago , Thank you for taking time to come for your Medicare Wellness Visit. I appreciate your ongoing commitment to your health goals. Please review the following plan we discussed and let me know if I can assist you in the future.   These are the goals we discussed:  Goals      Increase water intake     Recommend increasing water intake to 4 glasses (32 ounces a day) and slowly increase to 8 glasses (64 ounces) a day.     Medication Management     Patient Goals/Self-Care Activities Over the next 90 days, patient will:  Take medications as prescribed        This is a list of the screening recommended for you and due dates:  Health Maintenance  Topic Date Due   Zoster (Shingles) Vaccine (2 of 2) 03/20/2018   Tetanus Vaccine  10/27/2019   COVID-19 Vaccine (5 - Pfizer series) 04/26/2021   Flu Shot  09/20/2021   Colon Cancer Screening  01/09/2023   Pneumonia Vaccine  Completed   DEXA scan (bone density measurement)  Completed   HPV Vaccine  Aged Out

## 2021-11-18 NOTE — Addendum Note (Signed)
Addended by: Eual Fines on: 11/18/2021 01:31 PM   Modules accepted: Orders, Level of Service

## 2021-12-13 ENCOUNTER — Ambulatory Visit: Payer: Medicare HMO | Admitting: Family Medicine

## 2021-12-13 ENCOUNTER — Encounter: Payer: Self-pay | Admitting: Family Medicine

## 2021-12-20 ENCOUNTER — Ambulatory Visit (INDEPENDENT_AMBULATORY_CARE_PROVIDER_SITE_OTHER): Payer: Medicare HMO | Admitting: Family Medicine

## 2021-12-20 ENCOUNTER — Other Ambulatory Visit: Payer: Self-pay | Admitting: Family Medicine

## 2021-12-20 ENCOUNTER — Other Ambulatory Visit: Payer: Self-pay

## 2021-12-20 DIAGNOSIS — J0110 Acute frontal sinusitis, unspecified: Secondary | ICD-10-CM

## 2021-12-20 DIAGNOSIS — J209 Acute bronchitis, unspecified: Secondary | ICD-10-CM | POA: Diagnosis not present

## 2021-12-20 DIAGNOSIS — E7849 Other hyperlipidemia: Secondary | ICD-10-CM

## 2021-12-20 MED ORDER — DOXYCYCLINE HYCLATE 100 MG PO TABS: 100.0000 mg | ORAL_TABLET | Freq: Two times a day (BID) | ORAL | 0 refills | Status: DC

## 2021-12-20 MED ORDER — BENZONATATE 100 MG PO CAPS: 100.0000 mg | ORAL_CAPSULE | Freq: Two times a day (BID) | ORAL | 0 refills | Status: DC | PRN

## 2021-12-20 NOTE — Patient Instructions (Signed)
F/U in office in 3 to 5 weeks, call if you need me sooner  You are treated for acute sinusitis and bronchitis, tessalon perles and doxycycline are prescribed  Please get covid vaccine in the next 2 to 3 weeks  Thanks for choosing [facility], we consider it a privelige to serve you.

## 2021-12-20 NOTE — Progress Notes (Unsigned)
Virtual Visit via Telephone Note  I connected with Andrea Santiago on 12/20/21 at  1:40 PM EDT by telephone and verified that I am speaking with the correct person using two identifiers.  Location: Patient: home Provider: office   I discussed the limitations, risks, security and privacy concerns of performing an evaluation and management service by telephone and the availability of in person appointments. I also discussed with the patient that there may be a patient responsible charge related to this service. The patient expressed understanding and agreed to proceed.   History of Present Illness:   3 day h/o increased cough and chest congestion, no fever, or chills, yellow sputum, has sinus drainage and pressure  5 days ago, has had chills, no documented fever Observations/Objective: There were no vitals taken for this visit. Good communication with no confusion and intact memory. Alert , nasal and head congestion noted with intermitten rattling cough   Assessment and Plan: Acute bronchitis Doxycyline and tessalon perles pescribed  Acute sinusitis Doxycyline prescribed   Follow Up Instructions:    I discussed the assessment and treatment plan with the patient. The patient was provided an opportunity to ask questions and all were answered. The patient agreed with the plan and demonstrated an understanding of the instructions.   The patient was advised to call back or seek an in-person evaluation if the symptoms worsen or if the condition fails to improve as anticipated.  I provided 8 minutes of non-face-to-face time during this encounter.   Tula Nakayama, MD

## 2021-12-20 NOTE — Progress Notes (Deleted)
.  virt 

## 2021-12-21 ENCOUNTER — Encounter: Payer: Self-pay | Admitting: Family Medicine

## 2021-12-21 NOTE — Assessment & Plan Note (Signed)
Doxycyline prescribed 

## 2021-12-21 NOTE — Assessment & Plan Note (Signed)
Doxycyline and tessalon perles pescribed

## 2022-01-04 DIAGNOSIS — E7849 Other hyperlipidemia: Secondary | ICD-10-CM | POA: Diagnosis not present

## 2022-01-04 DIAGNOSIS — I10 Essential (primary) hypertension: Secondary | ICD-10-CM | POA: Diagnosis not present

## 2022-01-04 DIAGNOSIS — R7303 Prediabetes: Secondary | ICD-10-CM | POA: Diagnosis not present

## 2022-01-04 DIAGNOSIS — E559 Vitamin D deficiency, unspecified: Secondary | ICD-10-CM | POA: Diagnosis not present

## 2022-01-05 LAB — CMP14+EGFR
ALT: 20 IU/L (ref 0–32)
AST: 25 IU/L (ref 0–40)
Albumin/Globulin Ratio: 1.3 (ref 1.2–2.2)
Albumin: 4.3 g/dL (ref 3.7–4.7)
Alkaline Phosphatase: 65 IU/L (ref 44–121)
BUN/Creatinine Ratio: 17 (ref 12–28)
BUN: 15 mg/dL (ref 8–27)
Bilirubin Total: 0.4 mg/dL (ref 0.0–1.2)
CO2: 23 mmol/L (ref 20–29)
Calcium: 9.9 mg/dL (ref 8.7–10.3)
Chloride: 101 mmol/L (ref 96–106)
Creatinine, Ser: 0.87 mg/dL (ref 0.57–1.00)
Globulin, Total: 3.3 g/dL (ref 1.5–4.5)
Glucose: 101 mg/dL — ABNORMAL HIGH (ref 70–99)
Potassium: 4 mmol/L (ref 3.5–5.2)
Sodium: 141 mmol/L (ref 134–144)
Total Protein: 7.6 g/dL (ref 6.0–8.5)
eGFR: 66 mL/min/{1.73_m2} (ref >59)

## 2022-01-05 LAB — CBC
Hematocrit: 42.3 % (ref 34.0–46.6)
Hemoglobin: 14.1 g/dL (ref 11.1–15.9)
MCH: 30 pg (ref 26.6–33.0)
MCHC: 33.3 g/dL (ref 31.5–35.7)
MCV: 90 fL (ref 79–97)
Platelets: 297 10*3/uL (ref 150–450)
RBC: 4.7 x10E6/uL (ref 3.77–5.28)
RDW: 12.8 % (ref 11.7–15.4)
WBC: 8.1 10*3/uL (ref 3.4–10.8)

## 2022-01-05 LAB — HEMOGLOBIN A1C
Est. average glucose Bld gHb Est-mCnc: 137 mg/dL
Hgb A1c MFr Bld: 6.4 % — ABNORMAL HIGH (ref 4.8–5.6)

## 2022-01-05 LAB — LIPID PANEL
Chol/HDL Ratio: 5.3 ratio — ABNORMAL HIGH (ref 0.0–4.4)
Cholesterol, Total: 207 mg/dL — ABNORMAL HIGH (ref 100–199)
HDL: 39 mg/dL — ABNORMAL LOW (ref >39)
LDL Chol Calc (NIH): 117 mg/dL — ABNORMAL HIGH (ref 0–99)
Triglycerides: 294 mg/dL — ABNORMAL HIGH (ref 0–149)
VLDL Cholesterol Cal: 51 mg/dL — ABNORMAL HIGH (ref 5–40)

## 2022-01-05 LAB — TSH: TSH: 0.756 u[IU]/mL (ref 0.450–4.500)

## 2022-01-05 LAB — VITAMIN D 25 HYDROXY (VIT D DEFICIENCY, FRACTURES): Vit D, 25-Hydroxy: 32.9 ng/mL (ref 30.0–100.0)

## 2022-02-02 ENCOUNTER — Ambulatory Visit (INDEPENDENT_AMBULATORY_CARE_PROVIDER_SITE_OTHER): Payer: Medicare HMO | Admitting: Family Medicine

## 2022-02-02 ENCOUNTER — Encounter: Payer: Self-pay | Admitting: Family Medicine

## 2022-02-02 VITALS — BP 144/78 | HR 77 | Ht 64.0 in | Wt 168.0 lb

## 2022-02-02 DIAGNOSIS — E782 Mixed hyperlipidemia: Secondary | ICD-10-CM

## 2022-02-02 DIAGNOSIS — J301 Allergic rhinitis due to pollen: Secondary | ICD-10-CM

## 2022-02-02 DIAGNOSIS — Z1231 Encounter for screening mammogram for malignant neoplasm of breast: Secondary | ICD-10-CM | POA: Diagnosis not present

## 2022-02-02 DIAGNOSIS — R7303 Prediabetes: Secondary | ICD-10-CM

## 2022-02-02 DIAGNOSIS — E039 Hypothyroidism, unspecified: Secondary | ICD-10-CM

## 2022-02-02 DIAGNOSIS — I350 Nonrheumatic aortic (valve) stenosis: Secondary | ICD-10-CM

## 2022-02-02 DIAGNOSIS — I25119 Atherosclerotic heart disease of native coronary artery with unspecified angina pectoris: Secondary | ICD-10-CM

## 2022-02-02 DIAGNOSIS — I10 Essential (primary) hypertension: Secondary | ICD-10-CM | POA: Diagnosis not present

## 2022-02-02 DIAGNOSIS — E7849 Other hyperlipidemia: Secondary | ICD-10-CM

## 2022-02-02 MED ORDER — FENOFIBRATE 48 MG PO TABS: 48.0000 mg | ORAL_TABLET | Freq: Every day | ORAL | 5 refills | Status: DC

## 2022-02-02 NOTE — Patient Instructions (Signed)
F/U in 13 weeks, call if you need me sooner  Reduce cheese, new is fenofibrate and take ezetemide daily, TG and cholesterol are high  Pls cut  back on rice, past , bread, blood sugar has increased and you are nearer to being diabetic  You are referred to Cardiology, [facility])  Please schedule mammogram at checkout     Fasting lipid, cmp amd EGFR and hBA1C 3 days before  Thanks for choosing [facility], we consider it a privelige to serve you.

## 2022-02-05 ENCOUNTER — Encounter: Payer: Self-pay | Admitting: Family Medicine

## 2022-02-05 NOTE — Assessment & Plan Note (Signed)
Increased fatigue x 3 months, needs to keep appt with Cardiology next week

## 2022-02-05 NOTE — Assessment & Plan Note (Signed)
Patient educated about the importance of limiting  Carbohydrate intake , the need to commit to daily physical activity for a minimum of 30 minutes , and to commit weight loss. The fact that changes in all these areas will reduce or eliminate all together the development of diabetes is stressed.      Latest Ref Rng & Units 01/04/2022   11:47 AM 05/31/2021    3:31 PM 03/29/2021    2:52 PM 12/28/2020   10:07 AM 06/09/2020    1:38 PM  Diabetic Labs  HbA1c 4.8 - 5.6 % 6.4  6.3  6.2     Chol 100 - 199 mg/dL 207    111  235   HDL >39 mg/dL 39    40  40   Calc LDL 0 - 99 mg/dL 117    48  147   Triglycerides 0 - 149 mg/dL 294    128  260   Creatinine 0.57 - 1.00 mg/dL 0.87    0.90  0.78       02/02/2022   11:28 AM 02/02/2022   11:26 AM 08/01/2021    1:36 PM 07/28/2021    9:31 AM 07/13/2021    3:38 PM 05/31/2021    2:43 PM 05/19/2021    2:17 PM  BP/Weight  Systolic BP 277 824 235 361 443 154 008  Diastolic BP 78 75 66 72 73 81 72  Wt. (Lbs)  168.04 165 165.08 169 169.9 168  BMI  28.84 kg/m2 28.32 kg/m2 28.34 kg/m2 29.01 kg/m2 29.16 kg/m2 28.84 kg/m2       No data to display         Deteriorated Updated lab needed at/ before next visit.

## 2022-02-05 NOTE — Progress Notes (Signed)
Andrea Santiago     MRN: [id number]      DOB: 20-Mar-1940   HPI Andrea Santiago is here for follow up and re-evaluation of chronic medical conditions, medication management and review of any available recent lab and radiology data.  Preventive health is updated, specifically  Cancer screening and Immunization.   . The PT denies any adverse reactions to current medications since the last visit.  C/o chronic fatigue with poor exercise tolerance , needs Cardiology follow up. No specific c/o exertional chest pain ROS Denies recent fever or chills. Denies sinus pressure, nasal congestion, ear pain or sore throat. Denies chest congestion, productive cough or wheezing. Denies chest pains, palpitations and leg swelling, denies PND or rothopnea Denies abdominal pain, nausea, vomiting,diarrhea or constipation.   Denies dysuria, frequency, hesitancy or incontinence. Chronic  joint pain, swelling and limitation in mobility. Denies headaches, seizures, numbness, or tingling. Denies depression, anxiety or insomnia. Denies skin break down or rash.   PE  BP (!) 144/78 (BP Location: Left Arm, Patient Position: Sitting, Cuff Size: Normal)   Pulse 77   Ht '5\' 4"'$  (1.626 m)   Wt 168 lb 0.6 oz (76.2 kg)   SpO2 96%   BMI 28.84 kg/m   Patient alert and oriented and in no cardiopulmonary distress.  HEENT: No facial asymmetry, EOMI,     Neck supple .  Chest: Clear to auscultation bilaterally.  CVS: S1, S2 systolic  murmurs, no S3.Regular rate.  ABD: Soft non tender.   Ext: No edema  MS: Adequate ROM spine, shoulders, hips and knees.  Skin: Intact, no ulcerations or rash noted.  Psych: Good eye contact, normal affect. Memory intact not anxious or depressed appearing.  CNS: CN 2-12 intact, power,  normal throughout.no focal deficits noted.   Assessment & Plan  Hypertension Un Controlled, no change in medication, cardiology f/u  past due will have re evaluated at that visit DASH diet and  commitment to daily physical activity for a minimum of 30 minutes discussed and encouraged, as a part of hypertension management. The importance of attaining a healthy weight is also discussed.     02/02/2022   11:28 AM 02/02/2022   11:26 AM 08/01/2021    1:36 PM 07/28/2021    9:31 AM 07/13/2021    3:38 PM 05/31/2021    2:43 PM 05/19/2021    2:17 PM  BP/Weight  Systolic BP 417 408 144 818 563 149 702  Diastolic BP 78 75 66 72 73 81 72  Wt. (Lbs)  168.04 165 165.08 169 169.9 168  BMI  28.84 kg/m2 28.32 kg/m2 28.34 kg/m2 29.01 kg/m2 29.16 kg/m2 28.84 kg/m2       CAD (coronary atherosclerotic disease) Reports increased fatigiue in apst  to 3 months, cardiology to re evaluate  Allergic rhinitis Stable and controlled currently on as needed medication  Hypothyroidism Controlled, no change in medication Managed by Endo  Aortic stenosis Increased fatigue x 3 months, needs to keep appt with Cardiology next week  Hyperlipemia Hyperlipidemia:Low fat diet discussed and encouraged.   Lipid Panel  Lab Results  Component Value Date   CHOL 207 (H) 01/04/2022   HDL 39 (L) 01/04/2022   LDLCALC 117 (H) 01/04/2022   LDLDIRECT 141 (H) 10/31/2007   TRIG 294 (H) 01/04/2022   CHOLHDL 5.3 (H) 01/04/2022     Uncontrolled add fenofibrate and reduce fat intake  Prediabetes Patient educated about the importance of limiting  Carbohydrate intake , the need to commit to daily  physical activity for a minimum of 30 minutes , and to commit weight loss. The fact that changes in all these areas will reduce or eliminate all together the development of diabetes is stressed.      Latest Ref Rng & Units 01/04/2022   11:47 AM 05/31/2021    3:31 PM 03/29/2021    2:52 PM 12/28/2020   10:07 AM 06/09/2020    1:38 PM  Diabetic Labs  HbA1c 4.8 - 5.6 % 6.4  6.3  6.2     Chol 100 - 199 mg/dL 207    111  235   HDL >39 mg/dL 39    40  40   Calc LDL 0 - 99 mg/dL 117    48  147   Triglycerides 0 - 149 mg/dL 294     128  260   Creatinine 0.57 - 1.00 mg/dL 0.87    0.90  0.78       02/02/2022   11:28 AM 02/02/2022   11:26 AM 08/01/2021    1:36 PM 07/28/2021    9:31 AM 07/13/2021    3:38 PM 05/31/2021    2:43 PM 05/19/2021    2:17 PM  BP/Weight  Systolic BP 478 295 621 308 657 846 962  Diastolic BP 78 75 66 72 73 81 72  Wt. (Lbs)  168.04 165 165.08 169 169.9 168  BMI  28.84 kg/m2 28.32 kg/m2 28.34 kg/m2 29.01 kg/m2 29.16 kg/m2 28.84 kg/m2       No data to display         Deteriorated Updated lab needed at/ before next visit.

## 2022-02-05 NOTE — Assessment & Plan Note (Signed)
Stable and controlled currently on as needed medication

## 2022-02-05 NOTE — Assessment & Plan Note (Signed)
Hyperlipidemia:Low fat diet discussed and encouraged.   Lipid Panel  Lab Results  Component Value Date   CHOL 207 (H) 01/04/2022   HDL 39 (L) 01/04/2022   LDLCALC 117 (H) 01/04/2022   LDLDIRECT 141 (H) 10/31/2007   TRIG 294 (H) 01/04/2022   CHOLHDL 5.3 (H) 01/04/2022     Uncontrolled add fenofibrate and reduce fat intake

## 2022-02-05 NOTE — Assessment & Plan Note (Signed)
Reports increased fatigiue in apst  to 3 months, cardiology to re evaluate

## 2022-02-05 NOTE — Assessment & Plan Note (Signed)
Un Controlled, no change in medication, cardiology f/u  past due will have re evaluated at that visit DASH diet and commitment to daily physical activity for a minimum of 30 minutes discussed and encouraged, as a part of hypertension management. The importance of attaining a healthy weight is also discussed.     02/02/2022   11:28 AM 02/02/2022   11:26 AM 08/01/2021    1:36 PM 07/28/2021    9:31 AM 07/13/2021    3:38 PM 05/31/2021    2:43 PM 05/19/2021    2:17 PM  BP/Weight  Systolic BP 262 035 597 416 384 536 468  Diastolic BP 78 75 66 72 73 81 72  Wt. (Lbs)  168.04 165 165.08 169 169.9 168  BMI  28.84 kg/m2 28.32 kg/m2 28.34 kg/m2 29.01 kg/m2 29.16 kg/m2 28.84 kg/m2

## 2022-02-05 NOTE — Assessment & Plan Note (Signed)
Controlled, no change in medication  Managed by Endo 

## 2022-02-09 ENCOUNTER — Encounter: Payer: Self-pay | Admitting: Cardiology

## 2022-02-09 ENCOUNTER — Ambulatory Visit: Payer: Medicare HMO | Attending: Cardiology | Admitting: Cardiology

## 2022-02-09 VITALS — BP 136/72 | HR 80 | Ht 64.0 in | Wt 164.0 lb

## 2022-02-09 DIAGNOSIS — I10 Essential (primary) hypertension: Secondary | ICD-10-CM

## 2022-02-09 DIAGNOSIS — E782 Mixed hyperlipidemia: Secondary | ICD-10-CM

## 2022-02-09 DIAGNOSIS — I350 Nonrheumatic aortic (valve) stenosis: Secondary | ICD-10-CM | POA: Diagnosis not present

## 2022-02-09 NOTE — Patient Instructions (Addendum)
Medication Instructions:  Continue all current medications.   Labwork: none  Testing/Procedures: none  Follow-Up: 6 months   Any Other Special Instructions Will Be Listed Below (If Applicable).   If you need a refill on your cardiac medications before your next appointment, please call your pharmacy.  
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
## 2022-02-10 NOTE — Addendum Note (Signed)
Addended by: Laurine Blazer on: 02/10/2022 08:42 AM   Modules accepted: Orders

## 2022-02-16 ENCOUNTER — Ambulatory Visit (HOSPITAL_COMMUNITY): Payer: Medicare HMO

## 2022-02-25 ENCOUNTER — Other Ambulatory Visit: Payer: Self-pay | Admitting: Family Medicine

## 2022-02-27 ENCOUNTER — Ambulatory Visit (HOSPITAL_COMMUNITY)
Admission: RE | Admit: 2022-02-27 | Discharge: 2022-02-27 | Disposition: A | Discharge status: Home / Self Care | Payer: Medicare HMO | Source: Ambulatory Visit | Attending: Family Medicine | Admitting: Family Medicine

## 2022-02-27 DIAGNOSIS — Z1231 Encounter for screening mammogram for malignant neoplasm of breast: Secondary | ICD-10-CM | POA: Insufficient documentation

## 2022-03-01 ENCOUNTER — Ambulatory Visit (INDEPENDENT_AMBULATORY_CARE_PROVIDER_SITE_OTHER): Payer: Medicare HMO | Admitting: Family Medicine

## 2022-03-01 ENCOUNTER — Encounter: Payer: Self-pay | Admitting: Family Medicine

## 2022-03-01 VITALS — BP 138/69 | HR 72 | Ht 64.0 in | Wt 165.0 lb

## 2022-03-01 DIAGNOSIS — R21 Rash and other nonspecific skin eruption: Secondary | ICD-10-CM | POA: Diagnosis not present

## 2022-03-01 MED ORDER — EMOLLIENT BASE EX CREA: TOPICAL_CREAM | CUTANEOUS | 0 refills | Status: AC | PRN

## 2022-03-01 MED ORDER — TRIAMCINOLONE ACETONIDE 0.1 % EX CREA: 1.0000 | TOPICAL_CREAM | Freq: Two times a day (BID) | CUTANEOUS | 0 refills | Status: DC

## 2022-03-01 NOTE — Assessment & Plan Note (Addendum)
Four small vesicular pink round pustules located to be on the right shoulder. No open wound or cuts noted  Patient describes the rash is itching and burning sensation. Prescribed Kenalog 0.1% ointment and emollient cream.

## 2022-03-01 NOTE — Patient Instructions (Signed)
Please take medications as ordered. If symptoms get worse please contact your health care provider.

## 2022-03-01 NOTE — Progress Notes (Signed)
Established Patient Office Visit  Subjective   Patient ID: Andrea Santiago, female    DOB: [birth year]  Age: 82 y.o. MRN: [id number]  Chief Complaint  Patient presents with   Rash    Patient complains of an itchy, painful rash on upper R shoulder starting 7 days ago.     Andrea Santiago 82 year old female past medical history reviewed.Presents to the clinic complaining of rash involving the right shoulder. Rash started 7 days ago. Appearance of rash noted to be four small vesicular pink round pustules. Rash has not changed over time. Initial distribution right shoulder. Discomfort associated with rash includes itching, burning sensation. Patient has not had previous evaluation of rash. Patient has not had previous treatment. Patient has not had contacts with similar rash. Patient has not had new exposures to soaps, lotions, laundry detergents, foods, medications, plants, insects or animals. Patient denies cough, facial edema, fever. Treatment tried alcohol on skin and provided no relief.    Patient Active Problem List   Diagnosis Date Noted   Rash of back 03/01/2022   Cough 07/28/2021   Encounter for Medicare annual examination with abnormal findings 07/17/2021   Unsteady gait 03/29/2021   Bilateral leg weakness 03/29/2021   Aortic stenosis 12/22/2020   Breast pain, left 07/21/2020   Shoulder pain 09/09/2019   Chronic migraine without aura without status migrainosus, not intractable 04/20/2019   Heart murmur, systolic 17/61/6073   Chronic right SI joint pain 09/11/2017   Hip pain, chronic, right 09/11/2017   Posterior chest pain 09/11/2017   Depression, major, single episode, severe ([place]) 05/05/2017   Essential hypertension 05/05/2017   Osteopenia of multiple sites 05/29/2016   Vitamin D deficiency 05/25/2016   Primary osteoarthritis of both hands 05/11/2016   Primary osteoarthritis of both feet 05/11/2016   DJD (degenerative joint disease), cervical 05/11/2016   Spondylosis of  lumbar region without myelopathy or radiculopathy 05/11/2016   Primary osteoarthritis of both knees 05/11/2016   Headache 04/06/2016   Fibromyalgia 12/18/2015   Hypothyroidism 11/23/2015   Lumbar stenosis with neurogenic claudication 06/21/2015   At high risk for falls 03/28/2015   Multinodular goiter 03/30/2014   CAD (coronary atherosclerotic disease) 03/12/2013   Allergic rhinitis 06/12/2011   Hypothyroid 01/30/2011   Prediabetes 10/26/2009   Overweight 11/22/2008   Low back pain with left-sided sciatica 03/24/2008   Mixed hyperlipidemia 03/06/2006   Hypertension 03/06/2006   GERD 03/06/2006   Myalgia and myositis 03/06/2006   Osteoporosis 03/06/2006      Review of Systems  Constitutional:  Negative for chills and fever.  Respiratory:  Negative for shortness of breath.   Cardiovascular:  Negative for chest pain.  Skin:  Positive for itching and rash.      Objective:     BP 138/69   Pulse 72   Ht '5\' 4"'$  (1.626 m)   Wt 165 lb (74.8 kg)   SpO2 95%   BMI 28.32 kg/m  BP Readings from Last 3 Encounters:  03/01/22 138/69  02/09/22 136/72  02/02/22 (!) 144/78      Physical Exam Skin:    Findings: Rash present. Rash is crusting, scaling and vesicular. Rash is not pustular or urticarial.      No results found for any visits on 03/01/22.  Last CBC Lab Results  Component Value Date   WBC 8.1 01/04/2022   HGB 14.1 01/04/2022   HCT 42.3 01/04/2022   MCV 90 01/04/2022   MCH 30.0 01/04/2022   RDW 12.8  01/04/2022   PLT 297 12/14/8525   Last metabolic panel Lab Results  Component Value Date   GLUCOSE 101 (H) 01/04/2022   NA 141 01/04/2022   K 4.0 01/04/2022   CL 101 01/04/2022   CO2 23 01/04/2022   BUN 15 01/04/2022   CREATININE 0.87 01/04/2022   EGFR 66 01/04/2022   CALCIUM 9.9 01/04/2022   PROT 7.6 01/04/2022   ALBUMIN 4.3 01/04/2022   LABGLOB 3.3 01/04/2022   AGRATIO 1.3 01/04/2022   BILITOT 0.4 01/04/2022   ALKPHOS 65 01/04/2022   AST 25  01/04/2022   ALT 20 01/04/2022   ANIONGAP 7 06/15/2015   Last lipids Lab Results  Component Value Date   CHOL 207 (H) 01/04/2022   HDL 39 (L) 01/04/2022   LDLCALC 117 (H) 01/04/2022   LDLDIRECT 141 (H) 10/31/2007   TRIG 294 (H) 01/04/2022   CHOLHDL 5.3 (H) 01/04/2022   Last hemoglobin A1c Lab Results  Component Value Date   HGBA1C 6.4 (H) 01/04/2022   Last thyroid functions Lab Results  Component Value Date   TSH 0.756 01/04/2022   Last vitamin D Lab Results  Component Value Date   VD25OH 32.9 01/04/2022   Last vitamin B12 and Folate Lab Results  Component Value Date   VITAMINB12 420 05/31/2021   FOLATE >20.0 05/31/2021      The ASCVD Risk score (Arnett DK, et al., 2019) failed to calculate for the following reasons:   The 2019 ASCVD risk score is only valid for ages 32 to 57    Assessment & Plan:   Problem List Items Addressed This Visit       Musculoskeletal and Integument   Rash of back - Primary    Four small vesicular pink round pustules located to be on the right shoulder. No open wound or cuts noted  Patient describes the rash is itching and burning sensation. Prescribed Kenalog 0.1% ointment and emollient cream.      Relevant Medications   triamcinolone cream (KENALOG) 0.1 %   emollient (BIAFINE) cream    No follow-ups on file.    Renard Hamper Ria Comment, FNP

## 2022-03-15 ENCOUNTER — Ambulatory Visit (INDEPENDENT_AMBULATORY_CARE_PROVIDER_SITE_OTHER): Payer: Medicare HMO | Admitting: Family Medicine

## 2022-03-15 ENCOUNTER — Encounter: Payer: Self-pay | Admitting: Family Medicine

## 2022-03-15 VITALS — BP 124/72 | HR 82 | Ht 64.0 in | Wt 167.0 lb

## 2022-03-15 DIAGNOSIS — R058 Other specified cough: Secondary | ICD-10-CM

## 2022-03-15 DIAGNOSIS — J209 Acute bronchitis, unspecified: Secondary | ICD-10-CM | POA: Diagnosis not present

## 2022-03-15 DIAGNOSIS — J329 Chronic sinusitis, unspecified: Secondary | ICD-10-CM

## 2022-03-15 MED ORDER — PROMETHAZINE-DM 6.25-15 MG/5ML PO SYRP: 5.0000 mL | ORAL_SOLUTION | Freq: Four times a day (QID) | ORAL | 0 refills | Status: DC | PRN

## 2022-03-15 MED ORDER — DOXYCYCLINE HYCLATE 100 MG PO TABS: 100.0000 mg | ORAL_TABLET | Freq: Two times a day (BID) | ORAL | 0 refills | Status: AC

## 2022-03-15 NOTE — Assessment & Plan Note (Signed)
Patient is diabetic and would not be a good candidate for oral steroids Doxycycline ordered for anaerobic coverage Promethazine DM ordered for cough Take medication as prescribed. Increase fluids and allow for plenty of rest. Recommend Tylenol as needed for pain, fever, or general discomfort. Warm salt water gargles 3-4 times daily to help with throat pain or discomfort. Recommend using a humidifier at bedtime during sleep to help with cough and nasal congestion. Follow-up if your symptoms do not improve
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
## 2022-03-15 NOTE — Patient Instructions (Addendum)
I appreciate the opportunity to provide care to you today!    Follow up:  Dr. Moshe Cipro  Please pick up your medication at the pharmacy and start therapy  Take medication as prescribed. Increase fluids and allow for plenty of rest. Recommend Tylenol as needed for pain, fever, or general discomfort. Warm salt water gargles 3-4 times daily to help with throat pain or discomfort. Recommend using a humidifier at bedtime during sleep to help with cough and nasal congestion. Follow-up if your symptoms do not improve     Please continue to a heart-healthy diet and increase your physical activities. Try to exercise for 63mns at least five times a week.      It was a pleasure to see you and I look forward to continuing to work together on your health and well-being. Please do not hesitate to call the office if you need care or have questions about your care.   Have a wonderful day and week. With Gratitude, GAlvira MondayMSN, FNP-BC

## 2022-03-31 DIAGNOSIS — R7303 Prediabetes: Secondary | ICD-10-CM | POA: Diagnosis not present

## 2022-03-31 DIAGNOSIS — I10 Essential (primary) hypertension: Secondary | ICD-10-CM | POA: Diagnosis not present

## 2022-03-31 DIAGNOSIS — E7849 Other hyperlipidemia: Secondary | ICD-10-CM | POA: Diagnosis not present

## 2022-04-01 LAB — CMP14+EGFR
ALT: 16 IU/L (ref 0–32)
AST: 23 IU/L (ref 0–40)
Albumin/Globulin Ratio: 1.7 (ref 1.2–2.2)
Albumin: 4.7 g/dL (ref 3.7–4.7)
Alkaline Phosphatase: 56 IU/L (ref 44–121)
BUN/Creatinine Ratio: 19 (ref 12–28)
BUN: 16 mg/dL (ref 8–27)
Bilirubin Total: 0.4 mg/dL (ref 0.0–1.2)
CO2: 21 mmol/L (ref 20–29)
Calcium: 10 mg/dL (ref 8.7–10.3)
Chloride: 104 mmol/L (ref 96–106)
Creatinine, Ser: 0.84 mg/dL (ref 0.57–1.00)
Globulin, Total: 2.8 g/dL (ref 1.5–4.5)
Glucose: 118 mg/dL — ABNORMAL HIGH (ref 70–99)
Potassium: 4.5 mmol/L (ref 3.5–5.2)
Sodium: 141 mmol/L (ref 134–144)
Total Protein: 7.5 g/dL (ref 6.0–8.5)
eGFR: 69 mL/min/{1.73_m2} (ref >59)

## 2022-04-01 LAB — LIPID PANEL
Chol/HDL Ratio: 6.1 ratio — ABNORMAL HIGH (ref 0.0–4.4)
Cholesterol, Total: 244 mg/dL — ABNORMAL HIGH (ref 100–199)
HDL: 40 mg/dL (ref >39)
LDL Chol Calc (NIH): 165 mg/dL — ABNORMAL HIGH (ref 0–99)
Triglycerides: 211 mg/dL — ABNORMAL HIGH (ref 0–149)
VLDL Cholesterol Cal: 39 mg/dL (ref 5–40)

## 2022-04-01 LAB — HEMOGLOBIN A1C
Est. average glucose Bld gHb Est-mCnc: 137 mg/dL
Hgb A1c MFr Bld: 6.4 % — ABNORMAL HIGH (ref 4.8–5.6)

## 2022-04-05 MED ORDER — ROSUVASTATIN CALCIUM 10 MG PO TABS: 10.0000 mg | ORAL_TABLET | Freq: Every day | ORAL | 5 refills | Status: DC

## 2022-04-05 NOTE — Addendum Note (Signed)
Addended by: Tula Nakayama E on: 04/05/2022 02:09 PM   Modules accepted: Orders

## 2022-04-19 ENCOUNTER — Other Ambulatory Visit: Payer: Self-pay | Admitting: Family Medicine

## 2022-04-19 DIAGNOSIS — R21 Rash and other nonspecific skin eruption: Secondary | ICD-10-CM

## 2022-04-20 ENCOUNTER — Other Ambulatory Visit: Payer: Self-pay | Admitting: Family Medicine

## 2022-04-20 DIAGNOSIS — M25551 Pain in right hip: Secondary | ICD-10-CM | POA: Diagnosis not present

## 2022-04-20 DIAGNOSIS — M1611 Unilateral primary osteoarthritis, right hip: Secondary | ICD-10-CM | POA: Diagnosis not present

## 2022-04-20 DIAGNOSIS — Z5941 Food insecurity: Secondary | ICD-10-CM | POA: Diagnosis not present

## 2022-04-20 DIAGNOSIS — R21 Rash and other nonspecific skin eruption: Secondary | ICD-10-CM

## 2022-04-20 DIAGNOSIS — M79671 Pain in right foot: Secondary | ICD-10-CM | POA: Diagnosis not present

## 2022-04-20 DIAGNOSIS — Z5986 Financial insecurity: Secondary | ICD-10-CM | POA: Diagnosis not present

## 2022-04-20 DIAGNOSIS — Z87891 Personal history of nicotine dependence: Secondary | ICD-10-CM | POA: Diagnosis not present

## 2022-04-20 MED ORDER — TRIAMCINOLONE ACETONIDE 0.1 % EX CREA: 1.0000 | TOPICAL_CREAM | Freq: Two times a day (BID) | CUTANEOUS | 0 refills | Status: DC

## 2022-04-20 NOTE — Telephone Encounter (Signed)
I refilled

## 2022-05-04 ENCOUNTER — Ambulatory Visit (INDEPENDENT_AMBULATORY_CARE_PROVIDER_SITE_OTHER): Payer: Medicare HMO | Admitting: Family Medicine

## 2022-05-04 ENCOUNTER — Encounter: Payer: Self-pay | Admitting: Family Medicine

## 2022-05-04 VITALS — BP 136/68 | HR 66 | Ht 64.0 in | Wt 164.1 lb

## 2022-05-04 DIAGNOSIS — E039 Hypothyroidism, unspecified: Secondary | ICD-10-CM

## 2022-05-04 DIAGNOSIS — I10 Essential (primary) hypertension: Secondary | ICD-10-CM

## 2022-05-04 DIAGNOSIS — F5104 Psychophysiologic insomnia: Secondary | ICD-10-CM

## 2022-05-04 DIAGNOSIS — J301 Allergic rhinitis due to pollen: Secondary | ICD-10-CM

## 2022-05-04 DIAGNOSIS — E782 Mixed hyperlipidemia: Secondary | ICD-10-CM

## 2022-05-04 DIAGNOSIS — R7303 Prediabetes: Secondary | ICD-10-CM

## 2022-05-04 DIAGNOSIS — G43709 Chronic migraine without aura, not intractable, without status migrainosus: Secondary | ICD-10-CM

## 2022-05-04 MED ORDER — EZETIMIBE 10 MG PO TABS: 10.0000 mg | ORAL_TABLET | Freq: Every day | ORAL | 3 refills | Status: DC

## 2022-05-04 MED ORDER — ROSUVASTATIN CALCIUM 10 MG PO TABS: 10.0000 mg | ORAL_TABLET | Freq: Every day | ORAL | 5 refills | Status: DC

## 2022-05-04 MED ORDER — FENOFIBRATE 145 MG PO TABS: 145.0000 mg | ORAL_TABLET | Freq: Every day | ORAL | 5 refills | Status: DC

## 2022-05-04 MED ORDER — MELATONIN 1 MG PO CAPS: ORAL_CAPSULE | ORAL | 5 refills | Status: DC

## 2022-05-04 NOTE — Patient Instructions (Signed)
F/u in mid June, call if you need me sooner  NEW HIGHER DOSE of fenofibrate , for cholesterol and please continue zetia, and make sure that you have rosuvastatin as prescribed  Please sched fasting lipid, cmp and EGFr and TSH 5 to 7 days before next visit  Do relaxing activity not the news at bedtime and try melatonin as recommended  Careful not to fall  Thanks for choosing [facility], we consider it a privelige to serve you.

## 2022-05-07 ENCOUNTER — Encounter: Payer: Self-pay | Admitting: Family Medicine

## 2022-05-07 DIAGNOSIS — G4700 Insomnia, unspecified: Secondary | ICD-10-CM | POA: Insufficient documentation

## 2022-05-07 NOTE — Assessment & Plan Note (Signed)
Controlled, no change in medication DASH diet and commitment to daily physical activity for a minimum of 30 minutes discussed and encouraged, as a part of hypertension management. The importance of attaining a healthy weight is also discussed.     05/04/2022   10:37 AM 03/15/2022    1:06 PM 03/01/2022   11:19 AM 02/09/2022   11:46 AM 02/02/2022   11:28 AM 02/02/2022   11:26 AM 08/01/2021    1:36 PM  BP/Weight  Systolic BP [id number]  Diastolic BP 68 72 69 72 78 75 66  Wt. (Lbs) 164.08 167.04 165 164  168.04 165  BMI 28.16 kg/m2 28.67 kg/m2 28.32 kg/m2 28.15 kg/m2  28.84 kg/m2 28.32 kg/m2

## 2022-05-07 NOTE — Assessment & Plan Note (Signed)
Sleep hygiene reviewed and written information offered also. Prescription sent for  medication needed. Trial of OTC melatonin

## 2022-05-07 NOTE — Assessment & Plan Note (Signed)
Updated lab needed at/ before next visit. Controlled on med when last checked, was followed by Endo states none since Covid

## 2022-05-07 NOTE — Assessment & Plan Note (Signed)
Patient educated about the importance of limiting  Carbohydrate intake , the need to commit to daily physical activity for a minimum of 30 minutes , and to commit weight loss. The fact that changes in all these areas will reduce or eliminate all together the development of diabetes is stressed.      Latest Ref Rng & Units 03/31/2022    1:13 PM 01/04/2022   11:47 AM 05/31/2021    3:31 PM 03/29/2021    2:52 PM 12/28/2020   10:07 AM  Diabetic Labs  HbA1c 4.8 - 5.6 % 6.4  6.4  6.3  6.2    Chol 100 - 199 mg/dL 244  207    111   HDL >39 mg/dL 40  39    40   Calc LDL 0 - 99 mg/dL 165  117    48   Triglycerides 0 - 149 mg/dL 211  294    128   Creatinine 0.57 - 1.00 mg/dL 0.84  0.87    0.90       05/04/2022   10:37 AM 03/15/2022    1:06 PM 03/01/2022   11:19 AM 02/09/2022   11:46 AM 02/02/2022   11:28 AM 02/02/2022   11:26 AM 08/01/2021    1:36 PM  BP/Weight  Systolic BP [id number]  Diastolic BP 68 72 69 72 78 75 66  Wt. (Lbs) 164.08 167.04 165 164  168.04 165  BMI 28.16 kg/m2 28.67 kg/m2 28.32 kg/m2 28.15 kg/m2  28.84 kg/m2 28.32 kg/m2       No data to display          Updated lab needed at/ before next visit.

## 2022-05-07 NOTE — Progress Notes (Signed)
Andrea Santiago     MRN: [id number]      DOB: Dec 20, 1940   HPI Ms. Croner is here for follow up and re-evaluation of chronic medical conditions, medication management and review of any available recent lab and radiology data.  Preventive health is updated, specifically  Cancer screening and Immunization.   Questions or concerns regarding consultations or procedures which the PT has had in the interim are  addressed. The PT denies any adverse reactions to current medications since the last visit.  ROS Denies recent fever or chills. Denies sinus pressure, nasal congestion, ear pain or sore throat. Denies chest congestion, productive cough or wheezing. Denies chest pains, palpitations and leg swelling Denies abdominal pain, nausea, vomiting,diarrhea or constipation.   Denichronic Denies joint pain, swelling and limitation in mobility. Denies headaches, seizures, numbness, or tingling. Denies uncontrolled depression, or anxiety does c/o  insomnia. Denies skin break down or rash.   PE  BP 136/68 (BP Location: Right Arm, Patient Position: Sitting, Cuff Size: Large)   Pulse 66   Ht 5\' 4"  (1.626 m)   Wt 164 lb 1.3 oz (74.4 kg)   SpO2 98%   BMI 28.16 kg/m   Patient alert and oriented and in no cardiopulmonary distress.  HEENT: No facial asymmetry, EOMI,     Neck supple .  Chest: Clear to auscultation bilaterally.  CVS: S1, S2 systolic  murmurs, no S3.Regular rate.  ABD: Soft non tender.   Ext: No edema  MS: Decreased ROM spine, shoulders, hips and knees.  Skin: Intact, no ulcerations or rash noted.  Psych: Good eye contact, normal affect. Memory intact not anxious or depressed appearing.  CNS: CN 2-12 intact, power,  normal throughout.no focal deficits noted.   Assessment & Plan  Essential hypertension Controlled, no change in medication DASH diet and commitment to daily physical activity for a minimum of 30 minutes discussed and encouraged, as a part of hypertension  management. The importance of attaining a healthy weight is also discussed.     05/04/2022   10:37 AM 03/15/2022    1:06 PM 03/01/2022   11:19 AM 02/09/2022   11:46 AM 02/02/2022   11:28 AM 02/02/2022   11:26 AM 08/01/2021    1:36 PM  BP/Weight  Systolic BP [id number] [id number] [id number]  Diastolic BP 68 72 69 72 78 75 66  Wt. (Lbs) 164.08 167.04 165 164  168.04 165  BMI 28.16 kg/m2 28.67 kg/m2 28.32 kg/m2 28.15 kg/m2  28.84 kg/m2 28.32 kg/m2       Chronic migraine without aura without status migrainosus, not intractable Controlled on current meds , continue same  Allergic rhinitis Controlled, no change in medication   Hypothyroid Updated lab needed at/ before next visit. Controlled on med when last checked, was followed by Endo states none since Covid  Prediabetes Patient educated about the importance of limiting  Carbohydrate intake , the need to commit to daily physical activity for a minimum of 30 minutes , and to commit weight loss. The fact that changes in all these areas will reduce or eliminate all together the development of diabetes is stressed.      Latest Ref Rng & Units 03/31/2022    1:13 PM 01/04/2022   11:47 AM 05/31/2021    3:31 PM 03/29/2021    2:52 PM 12/28/2020   10:07 AM  Diabetic Labs  HbA1c 4.8 - 5.6 % 6.4  6.4  6.3  6.2    Chol 100 - 199  mg/dL 244  207    111   HDL >39 mg/dL 40  39    40   Calc LDL 0 - 99 mg/dL 165  117    48   Triglycerides 0 - 149 mg/dL 211  294    128   Creatinine 0.57 - 1.00 mg/dL 0.84  0.87    0.90       05/04/2022   10:37 AM 03/15/2022    1:06 PM 03/01/2022   11:19 AM 02/09/2022   11:46 AM 02/02/2022   11:28 AM 02/02/2022   11:26 AM 08/01/2021    1:36 PM  BP/Weight  Systolic BP [id number] 123456 [id number]  Diastolic BP 68 72 69 72 78 75 66  Wt. (Lbs) 164.08 167.04 165 164  168.04 165  BMI 28.16 kg/m2 28.67 kg/m2 28.32 kg/m2 28.15 kg/m2  28.84 kg/m2 28.32 kg/m2       No data to display          Updated lab needed  at/ before next visit.   Mixed hyperlipidemia Hyperlipidemia:Low fat diet discussed and encouraged.   Lipid Panel  Lab Results  Component Value Date   CHOL 244 (H) 03/31/2022   HDL 40 03/31/2022   LDLCALC 165 (H) 03/31/2022   LDLDIRECT 141 (H) 10/31/2007   TRIG 211 (H) 03/31/2022   CHOLHDL 6.1 (H) 03/31/2022     Not at goal, now on triple therapy Updated lab needed at/ before next visit.    Insomnia Sleep hygiene reviewed and written information offered also. Prescription sent for  medication needed. Trial of OTC melatonin

## 2022-05-07 NOTE — Assessment & Plan Note (Signed)
Hyperlipidemia:Low fat diet discussed and encouraged.   Lipid Panel  Lab Results  Component Value Date   CHOL 244 (H) 03/31/2022   HDL 40 03/31/2022   LDLCALC 165 (H) 03/31/2022   LDLDIRECT 141 (H) 10/31/2007   TRIG 211 (H) 03/31/2022   CHOLHDL 6.1 (H) 03/31/2022     Not at goal, now on triple therapy Updated lab needed at/ before next visit.

## 2022-05-07 NOTE — Assessment & Plan Note (Signed)
Controlled on current meds , continue same 

## 2022-05-07 NOTE — Assessment & Plan Note (Signed)
Controlled, no change in medication  

## 2022-05-08 ENCOUNTER — Telehealth: Payer: Self-pay

## 2022-05-08 NOTE — Telephone Encounter (Signed)
Patient contacted the office stating she is having terrible pain all over her body. Patient states the pain is keeping her up at night and that she cannot sleep. Patient states she would like something to help with the pain. Patient states she would like the prescription to be sent to the [facility] on [address] in [place]. Patient's call back number is [phone]. Please advise. Thanks.

## 2022-05-08 NOTE — Telephone Encounter (Signed)
Patient has not been seen in the office since March 2023.  We do not prescribe narcotics.  She has an upcoming appointment scheduled with Dr. Estanislado Pandy on 05/23/22 so her pain can be further evaluated at that time.  Please clarify if she would like to be referred  to pain management

## 2022-05-09 NOTE — Telephone Encounter (Signed)
Attempted to contact the patient and left a message to call the office back.

## 2022-05-09 NOTE — Telephone Encounter (Signed)
Patient contacted the office to return the call. Patient advised she has not been seen in the office since March 2023. We do not prescribe narcotics. She has an upcoming appointment scheduled with Dr. Estanislado Pandy on 05/23/22 so her pain can be further evaluated at that time. Please clarify if she would like to be referred to pain management. Patient states she does not wnat to be referred to pain management. Patient states she will wait to discuss further options at her [place] appointment.

## 2022-05-12 ENCOUNTER — Ambulatory Visit (INDEPENDENT_AMBULATORY_CARE_PROVIDER_SITE_OTHER): Payer: Medicare HMO | Admitting: Family Medicine

## 2022-05-12 ENCOUNTER — Encounter: Payer: Self-pay | Admitting: Family Medicine

## 2022-05-12 VITALS — BP 132/84 | HR 64 | Ht 64.0 in | Wt 164.0 lb

## 2022-05-12 DIAGNOSIS — M25551 Pain in right hip: Secondary | ICD-10-CM | POA: Diagnosis not present

## 2022-05-12 DIAGNOSIS — G8929 Other chronic pain: Secondary | ICD-10-CM | POA: Diagnosis not present

## 2022-05-12 MED ORDER — KETOROLAC TROMETHAMINE 60 MG/2ML IM SOLN
60.0000 mg | Freq: Once | INTRAMUSCULAR | Status: AC
  Administered 2022-05-12: 60 mg via INTRAMUSCULAR

## 2022-05-12 NOTE — Assessment & Plan Note (Signed)
Patient was seen in the ED on 04/20/2022 for right hip pain X-ray was reassuring, with signs of early symmetric degenerative changes in the hip No fractures or dislocations noted The patient is limping when walking due to the severity of her hip pain She rates her pain 15 out of 10 She declines a referral to orthopedic surgery and physical therapy Will be treated today with Toradol 60 injection in the clinic Encouraged the patient to take Tylenol as needed for hip pain and heat applications to the affected site Encouraged to continue using Biofreeze for symptomatic management
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
## 2022-05-12 NOTE — Patient Instructions (Addendum)
I appreciate the opportunity to provide care to you today!    Follow up: Dr. Moshe Cipro   I recommend taking over-the-counter extra-strength Tylenol as needed for right hip pain I also recommend avoiding activities that aggravate your hip pain Please follow-up with your rheumatologist as scheduled    Please continue to a heart-healthy diet and increase your physical activities. Try to exercise for 11mins at least five days a week.      It was a pleasure to see you and I look forward to continuing to work together on your health and well-being. Please do not hesitate to call the office if you need care or have questions about your care.   Have a wonderful day and week. With Gratitude, Alvira Monday MSN, FNP-BC
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
## 2022-05-23 ENCOUNTER — Encounter: Payer: Self-pay | Admitting: Rheumatology

## 2022-05-23 ENCOUNTER — Ambulatory Visit: Payer: Medicare HMO | Attending: Rheumatology | Admitting: Rheumatology

## 2022-05-23 VITALS — BP 137/65 | HR 63 | Resp 14 | Ht 64.0 in | Wt 164.0 lb

## 2022-05-23 DIAGNOSIS — M7061 Trochanteric bursitis, right hip: Secondary | ICD-10-CM

## 2022-05-23 DIAGNOSIS — M5030 Other cervical disc degeneration, unspecified cervical region: Secondary | ICD-10-CM

## 2022-05-23 DIAGNOSIS — R5383 Other fatigue: Secondary | ICD-10-CM | POA: Diagnosis not present

## 2022-05-23 DIAGNOSIS — M19041 Primary osteoarthritis, right hand: Secondary | ICD-10-CM

## 2022-05-23 DIAGNOSIS — M170 Bilateral primary osteoarthritis of knee: Secondary | ICD-10-CM

## 2022-05-23 DIAGNOSIS — M8589 Other specified disorders of bone density and structure, multiple sites: Secondary | ICD-10-CM

## 2022-05-23 DIAGNOSIS — M19042 Primary osteoarthritis, left hand: Secondary | ICD-10-CM

## 2022-05-23 DIAGNOSIS — E559 Vitamin D deficiency, unspecified: Secondary | ICD-10-CM

## 2022-05-23 DIAGNOSIS — M797 Fibromyalgia: Secondary | ICD-10-CM

## 2022-05-23 DIAGNOSIS — M62838 Other muscle spasm: Secondary | ICD-10-CM | POA: Diagnosis not present

## 2022-05-23 DIAGNOSIS — M19072 Primary osteoarthritis, left ankle and foot: Secondary | ICD-10-CM

## 2022-05-23 DIAGNOSIS — M5136 Other intervertebral disc degeneration, lumbar region: Secondary | ICD-10-CM

## 2022-05-23 DIAGNOSIS — Z8669 Personal history of other diseases of the nervous system and sense organs: Secondary | ICD-10-CM

## 2022-05-23 DIAGNOSIS — M19071 Primary osteoarthritis, right ankle and foot: Secondary | ICD-10-CM

## 2022-05-23 DIAGNOSIS — Z8639 Personal history of other endocrine, nutritional and metabolic disease: Secondary | ICD-10-CM

## 2022-05-23 DIAGNOSIS — M7062 Trochanteric bursitis, left hip: Secondary | ICD-10-CM

## 2022-05-23 DIAGNOSIS — Z8679 Personal history of other diseases of the circulatory system: Secondary | ICD-10-CM

## 2022-05-23 NOTE — Patient Instructions (Signed)

## 2022-06-16 ENCOUNTER — Encounter: Payer: Self-pay | Admitting: Physical Medicine & Rehabilitation

## 2022-07-06 ENCOUNTER — Encounter: Payer: Medicare HMO | Attending: Physical Medicine & Rehabilitation | Admitting: Physical Medicine & Rehabilitation

## 2022-07-06 ENCOUNTER — Encounter: Payer: Self-pay | Admitting: Physical Medicine & Rehabilitation

## 2022-07-06 VITALS — BP 148/79 | HR 69 | Ht 64.0 in | Wt 165.0 lb

## 2022-07-06 DIAGNOSIS — M19041 Primary osteoarthritis, right hand: Secondary | ICD-10-CM | POA: Diagnosis not present

## 2022-07-06 DIAGNOSIS — F32A Depression, unspecified: Secondary | ICD-10-CM | POA: Insufficient documentation

## 2022-07-06 DIAGNOSIS — M47816 Spondylosis without myelopathy or radiculopathy, lumbar region: Secondary | ICD-10-CM | POA: Insufficient documentation

## 2022-07-06 DIAGNOSIS — M797 Fibromyalgia: Secondary | ICD-10-CM

## 2022-07-06 DIAGNOSIS — M19042 Primary osteoarthritis, left hand: Secondary | ICD-10-CM | POA: Diagnosis not present

## 2022-07-06 MED ORDER — DULOXETINE HCL 30 MG PO CPEP: 30.0000 mg | ORAL_CAPSULE | Freq: Every day | ORAL | 2 refills | Status: DC
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
## 2022-07-16 ENCOUNTER — Other Ambulatory Visit: Payer: Self-pay | Admitting: Family Medicine

## 2022-07-29 ENCOUNTER — Other Ambulatory Visit: Payer: Self-pay | Admitting: Family Medicine

## 2022-08-03 DIAGNOSIS — E782 Mixed hyperlipidemia: Secondary | ICD-10-CM | POA: Diagnosis not present

## 2022-08-03 DIAGNOSIS — I10 Essential (primary) hypertension: Secondary | ICD-10-CM | POA: Diagnosis not present

## 2022-08-04 LAB — CMP14+EGFR
ALT: 18 IU/L (ref 0–32)
AST: 27 IU/L (ref 0–40)
Albumin/Globulin Ratio: 1.7
Albumin: 4.3 g/dL (ref 3.7–4.7)
Alkaline Phosphatase: 53 IU/L (ref 44–121)
BUN/Creatinine Ratio: 21 (ref 12–28)
BUN: 20 mg/dL (ref 8–27)
Bilirubin Total: 0.3 mg/dL (ref 0.0–1.2)
CO2: 21 mmol/L (ref 20–29)
Calcium: 9.8 mg/dL (ref 8.7–10.3)
Chloride: 104 mmol/L (ref 96–106)
Creatinine, Ser: 0.94 mg/dL (ref 0.57–1.00)
Globulin, Total: 2.6 g/dL (ref 1.5–4.5)
Glucose: 95 mg/dL (ref 70–99)
Potassium: 4.2 mmol/L (ref 3.5–5.2)
Sodium: 140 mmol/L (ref 134–144)
Total Protein: 6.9 g/dL (ref 6.0–8.5)
eGFR: 60 mL/min/{1.73_m2} (ref >59)

## 2022-08-04 LAB — LIPID PANEL
Chol/HDL Ratio: 3.8 ratio (ref 0.0–4.4)
Cholesterol, Total: 165 mg/dL (ref 100–199)
HDL: 43 mg/dL (ref >39)
LDL Chol Calc (NIH): 87 mg/dL (ref 0–99)
Triglycerides: 207 mg/dL — ABNORMAL HIGH (ref 0–149)
VLDL Cholesterol Cal: 35 mg/dL (ref 5–40)

## 2022-08-04 LAB — TSH: TSH: 2.58 u[IU]/mL (ref 0.450–4.500)

## 2022-08-07 ENCOUNTER — Other Ambulatory Visit: Payer: Self-pay | Admitting: Family Medicine

## 2022-08-07 DIAGNOSIS — R21 Rash and other nonspecific skin eruption: Secondary | ICD-10-CM

## 2022-08-09 ENCOUNTER — Encounter: Payer: Self-pay | Admitting: Family Medicine

## 2022-08-09 ENCOUNTER — Ambulatory Visit (INDEPENDENT_AMBULATORY_CARE_PROVIDER_SITE_OTHER): Payer: Medicare HMO | Admitting: Family Medicine

## 2022-08-09 VITALS — BP 120/74 | HR 69 | Ht 64.0 in | Wt 165.0 lb

## 2022-08-09 DIAGNOSIS — L089 Local infection of the skin and subcutaneous tissue, unspecified: Secondary | ICD-10-CM

## 2022-08-09 DIAGNOSIS — F324 Major depressive disorder, single episode, in partial remission: Secondary | ICD-10-CM

## 2022-08-09 DIAGNOSIS — I10 Essential (primary) hypertension: Secondary | ICD-10-CM

## 2022-08-09 DIAGNOSIS — R7303 Prediabetes: Secondary | ICD-10-CM | POA: Diagnosis not present

## 2022-08-09 DIAGNOSIS — M25551 Pain in right hip: Secondary | ICD-10-CM

## 2022-08-09 DIAGNOSIS — E039 Hypothyroidism, unspecified: Secondary | ICD-10-CM

## 2022-08-09 DIAGNOSIS — S50812A Abrasion of left forearm, initial encounter: Secondary | ICD-10-CM

## 2022-08-09 DIAGNOSIS — R2681 Unsteadiness on feet: Secondary | ICD-10-CM

## 2022-08-09 DIAGNOSIS — G8929 Other chronic pain: Secondary | ICD-10-CM

## 2022-08-09 DIAGNOSIS — E782 Mixed hyperlipidemia: Secondary | ICD-10-CM | POA: Diagnosis not present

## 2022-08-09 DIAGNOSIS — M89319 Hypertrophy of bone, unspecified shoulder: Secondary | ICD-10-CM

## 2022-08-09 MED ORDER — METHYLPREDNISOLONE ACETATE 80 MG/ML IJ SUSP
80.0000 mg | Freq: Once | INTRAMUSCULAR | Status: AC
Start: 2022-08-09 — End: 2022-08-09
  Administered 2022-08-09: 80 mg via INTRAMUSCULAR

## 2022-08-09 MED ORDER — PREDNISONE 5 MG PO TABS: 5.0000 mg | ORAL_TABLET | Freq: Two times a day (BID) | ORAL | 0 refills | Status: AC

## 2022-08-09 MED ORDER — CEPHALEXIN 500 MG PO CAPS
500.0000 mg | ORAL_CAPSULE | Freq: Two times a day (BID) | ORAL | 0 refills | Status: DC
Start: 2022-08-09 — End: 2022-08-17

## 2022-08-09 MED ORDER — POTASSIUM CHLORIDE CRYS ER 10 MEQ PO TBCR: EXTENDED_RELEASE_TABLET | ORAL | 1 refills | Status: DC

## 2022-08-09 NOTE — Patient Instructions (Addendum)
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    X rays of collar bone today  Nurse pls send rx for BP cuff to her pharmacy  Thanks for choosing [facility], we consider it a privelige to serve you.

## 2022-08-14 ENCOUNTER — Encounter: Payer: Self-pay | Admitting: Family Medicine

## 2022-08-14 DIAGNOSIS — L089 Local infection of the skin and subcutaneous tissue, unspecified: Secondary | ICD-10-CM | POA: Insufficient documentation

## 2022-08-14 DIAGNOSIS — I10 Essential (primary) hypertension: Secondary | ICD-10-CM | POA: Insufficient documentation

## 2022-08-14 DIAGNOSIS — M89319 Hypertrophy of bone, unspecified shoulder: Secondary | ICD-10-CM | POA: Insufficient documentation

## 2022-08-14 DIAGNOSIS — F324 Major depressive disorder, single episode, in partial remission: Secondary | ICD-10-CM | POA: Insufficient documentation

## 2022-08-14 NOTE — Assessment & Plan Note (Signed)
Current flare, depo medrol 80 mg IM followe by short course of prednisone

## 2022-08-14 NOTE — Progress Notes (Addendum)
Andrea Santiago     MRN: [id number]      DOB: 12-11-1940  Chief Complaint  Patient presents with   Follow-up    R leg pain     HPI Andrea Santiago is here for follow up and re-evaluation of chronic medical conditions, medication management and review of any available recent lab and radiology data.  Preventive health is updated, specifically  Cancer screening and Immunization.   Questions or concerns regarding consultations or procedures which the PT has had in the interim are  addressed. 1 week h/o increased right hip and lower extremity , no aggravating trauma, no red flags Accidentally scraped left forearm while mowing her yard  recently, on 08/04/2022, area is still healing, it is bruised and slightly red has used topical antibiotic, healing slowly. C/o bony abnormality over claviclethimks there is size discrepancy and is concerned,no known trauma ROS Denies recent fever or chills. Denies sinus pressure, nasal congestion, ear pain or sore throat. Denies chest congestion, productive cough or wheezing. Denies chest pains, palpitations and leg swelling Denies abdominal pain, nausea, vomiting,diarrhea or constipation.   Denies dysuria, frequency, hesitancy  . Denies headaches, seizures, numbness, or tingling. Denies depression, anxiety or insomnia.  PE  BP 120/74 (BP Location: Left Arm, Patient Position: Sitting, Cuff Size: Normal)   Pulse 69   Ht 5\' 4"  (1.626 m)   Wt 165 lb 0.6 oz (74.9 kg)   SpO2 96%   BMI 28.33 kg/m   Patient alert and oriented and in no cardiopulmonary distress.  HEENT: No facial asymmetry, EOMI,     Neck decreased ROM.right clavicular head appears slightly larger than left   Chest: Clear to auscultation bilaterally.  CVS: S1, S2 systolic murmur, no S3.Regular rate.  ABD: Soft non tender.   Ext: No edema  MS: Decreased  ROM spine, shoulders, hips and knees.  Skin: Iabrasion on left forearm, no purulent drainage ,  mild erythema Psych: Good eye  contact, normal affect. Memory intact not anxious or depressed appearing.  CNS: CN 2-12 intact, power,  normal throughout.no focal deficits noted.   Assessment & Plan  Hip pain, chronic, right Current flare, depo medrol 80 mg IM followe by short course of prednisone  Hypothyroidism Updated lab needed at/ before next visit.   Mixed hyperlipidemia Hyperlipidemia:Low fat diet discussed and encouraged.   Lipid Panel  Lab Results  Component Value Date   CHOL 165 08/03/2022   HDL 43 08/03/2022   LDLCALC 87 08/03/2022   LDLDIRECT 141 (H) 10/31/2007   TRIG 207 (H) 08/03/2022   CHOLHDL 3.8 08/03/2022     Need to reduce fried  foods, cheese, butter , nuts, due to high TG Updated lab needed at/ before next visit.   Clavicular enlargement Likely benign, will obtain x rays  Abrasion of forearm, infected, left, initial encounter Recent  accident while mowing lawn, on 08/04/2022, erythema and skin breakdown, 5 day course of keflex prescribed, superficial injury,  length approx 3.5 cm, call if fails to heal compleely or worsens  Depression, major, single episode, in partial remission (HCC) Continue current medication  Unsteady gait Home safety reviewed and advised to give up mowing her lawn  Hypothyroid Updated lab needed at/ before next visit.   Prediabetes Patient educated about the importance of limiting  Carbohydrate intake , the need to commit to daily physical activity for a minimum of 30 minutes , and to commit weight loss. The fact that changes in all these areas will reduce or eliminate  all together the development of diabetes is stressed.      Latest Ref Rng & Units 08/03/2022   11:13 AM 03/31/2022    1:13 PM 01/04/2022   11:47 AM 05/31/2021    3:31 PM 03/29/2021    2:52 PM  Diabetic Labs  HbA1c 4.8 - 5.6 %  6.4  6.4  6.3  6.2   Chol 100 - 199 mg/dL 161  096  045     HDL >40 mg/dL 43  40  39     Calc LDL 0 - 99 mg/dL 87  981  191     Triglycerides 0 - 149 mg/dL  478  295  621     Creatinine 0.57 - 1.00 mg/dL 3.08  6.57  8.46         08/09/2022   11:17 AM 08/09/2022   11:13 AM 07/06/2022   12:56 PM 05/23/2022   12:49 PM 05/12/2022   11:33 AM 05/12/2022   11:14 AM 05/04/2022   10:37 AM  BP/Weight  Systolic BP 120 151 148 137 132 149 136  Diastolic BP 74 71 79 65 84 77 68  Wt. (Lbs)  165.04 165 164  164 164.08  BMI  28.33 kg/m2 28.32 kg/m2 28.15 kg/m2  28.15 kg/m2 28.16 kg/m2       No data to display          Updated lab needed at/ before next visit.   Primary hypertension Controlled, no change in medication DASH diet and commitment to daily physical activity for a minimum of 30 minutes discussed and encouraged, as a part of hypertension management. The importance of attaining a healthy weight is also discussed.     08/09/2022   11:17 AM 08/09/2022   11:13 AM 07/06/2022   12:56 PM 05/23/2022   12:49 PM 05/12/2022   11:33 AM 05/12/2022   11:14 AM 05/04/2022   10:37 AM  BP/Weight  Systolic BP 120 151 148 137 132 149 136  Diastolic BP 74 71 79 65 84 77 68  Wt. (Lbs)  165.04 165 164  164 164.08  BMI  28.33 kg/m2 28.32 kg/m2 28.15 kg/m2  28.15 kg/m2 28.16 kg/m2

## 2022-08-14 NOTE — Assessment & Plan Note (Signed)
Updated lab needed at/ before next visit.   

## 2022-08-14 NOTE — Assessment & Plan Note (Signed)
Patient educated about the importance of limiting  Carbohydrate intake , the need to commit to daily physical activity for a minimum of 30 minutes , and to commit weight loss. The fact that changes in all these areas will reduce or eliminate all together the development of diabetes is stressed.      Latest Ref Rng & Units 08/03/2022   11:13 AM 03/31/2022    1:13 PM 01/04/2022   11:47 AM 05/31/2021    3:31 PM 03/29/2021    2:52 PM  Diabetic Labs  HbA1c 4.8 - 5.6 %  6.4  6.4  6.3  6.2   Chol 100 - 199 mg/dL 409  811  914     HDL >78 mg/dL 43  40  39     Calc LDL 0 - 99 mg/dL 87  295  621     Triglycerides 0 - 149 mg/dL 308  657  846     Creatinine 0.57 - 1.00 mg/dL 9.62  9.52  8.41         08/09/2022   11:17 AM 08/09/2022   11:13 AM 07/06/2022   12:56 PM 05/23/2022   12:49 PM 05/12/2022   11:33 AM 05/12/2022   11:14 AM 05/04/2022   10:37 AM  BP/Weight  Systolic BP 120 151 148 137 132 149 136  Diastolic BP 74 71 79 65 84 77 68  Wt. (Lbs)  165.04 165 164  164 164.08  BMI  28.33 kg/m2 28.32 kg/m2 28.15 kg/m2  28.15 kg/m2 28.16 kg/m2       No data to display          Updated lab needed at/ before next visit.

## 2022-08-14 NOTE — Assessment & Plan Note (Signed)
Likely benign, will obtain x rays

## 2022-08-14 NOTE — Assessment & Plan Note (Signed)
Controlled, no change in medication DASH diet and commitment to daily physical activity for a minimum of 30 minutes discussed and encouraged, as a part of hypertension management. The importance of attaining a healthy weight is also discussed.     08/09/2022   11:17 AM 08/09/2022   11:13 AM 07/06/2022   12:56 PM 05/23/2022   12:49 PM 05/12/2022   11:33 AM 05/12/2022   11:14 AM 05/04/2022   10:37 AM  BP/Weight  Systolic BP 120 151 148 137 132 149 136  Diastolic BP 74 71 79 65 84 77 68  Wt. (Lbs)  165.04 165 164  164 164.08  BMI  28.33 kg/m2 28.32 kg/m2 28.15 kg/m2  28.15 kg/m2 28.16 kg/m2

## 2022-08-14 NOTE — Assessment & Plan Note (Addendum)
Recent  accident while mowing lawn, on 08/04/2022, erythema and skin breakdown, 5 day course of keflex prescribed, superficial injury,  length approx 3.5 cm, call if fails to heal compleely or worsens

## 2022-08-14 NOTE — Assessment & Plan Note (Signed)
Continue current medication.

## 2022-08-14 NOTE — Assessment & Plan Note (Signed)
Home safety reviewed and advised to give up mowing her lawn

## 2022-08-14 NOTE — Assessment & Plan Note (Signed)
Hyperlipidemia:Low fat diet discussed and encouraged.   Lipid Panel  Lab Results  Component Value Date   CHOL 165 08/03/2022   HDL 43 08/03/2022   LDLCALC 87 08/03/2022   LDLDIRECT 141 (H) 10/31/2007   TRIG 207 (H) 08/03/2022   CHOLHDL 3.8 08/03/2022     Need to reduce fried  foods, cheese, butter , nuts, due to high TG Updated lab needed at/ before next visit.

## 2022-08-17 ENCOUNTER — Encounter: Payer: Self-pay | Admitting: Physical Medicine & Rehabilitation

## 2022-08-17 ENCOUNTER — Encounter: Payer: Medicare HMO | Attending: Physical Medicine & Rehabilitation | Admitting: Physical Medicine & Rehabilitation

## 2022-08-17 VITALS — BP 133/75 | HR 79 | Ht 64.0 in | Wt 166.0 lb

## 2022-08-17 DIAGNOSIS — M797 Fibromyalgia: Secondary | ICD-10-CM | POA: Diagnosis not present

## 2022-08-17 DIAGNOSIS — F32A Depression, unspecified: Secondary | ICD-10-CM | POA: Diagnosis not present

## 2022-08-17 DIAGNOSIS — M47816 Spondylosis without myelopathy or radiculopathy, lumbar region: Secondary | ICD-10-CM | POA: Diagnosis not present

## 2022-08-17 MED ORDER — DULOXETINE HCL 30 MG PO CPEP: 30.0000 mg | ORAL_CAPSULE | Freq: Every day | ORAL | 2 refills | Status: DC
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
## 2022-08-30 ENCOUNTER — Other Ambulatory Visit: Payer: Self-pay | Admitting: Family Medicine

## 2022-08-30 DIAGNOSIS — R21 Rash and other nonspecific skin eruption: Secondary | ICD-10-CM

## 2022-09-01 DIAGNOSIS — M47816 Spondylosis without myelopathy or radiculopathy, lumbar region: Secondary | ICD-10-CM | POA: Diagnosis not present

## 2022-09-01 DIAGNOSIS — M797 Fibromyalgia: Secondary | ICD-10-CM | POA: Diagnosis not present

## 2022-09-15 ENCOUNTER — Encounter: Payer: Medicare HMO | Attending: Physical Medicine & Rehabilitation | Admitting: Physical Medicine & Rehabilitation

## 2022-09-15 ENCOUNTER — Encounter: Payer: Self-pay | Admitting: Physical Medicine & Rehabilitation

## 2022-09-15 VITALS — BP 147/79 | HR 72 | Ht 64.0 in | Wt 161.8 lb

## 2022-09-15 DIAGNOSIS — F32A Depression, unspecified: Secondary | ICD-10-CM | POA: Insufficient documentation

## 2022-09-15 DIAGNOSIS — M19041 Primary osteoarthritis, right hand: Secondary | ICD-10-CM | POA: Diagnosis not present

## 2022-09-15 DIAGNOSIS — M797 Fibromyalgia: Secondary | ICD-10-CM | POA: Insufficient documentation

## 2022-09-15 DIAGNOSIS — M47816 Spondylosis without myelopathy or radiculopathy, lumbar region: Secondary | ICD-10-CM | POA: Diagnosis not present

## 2022-09-15 DIAGNOSIS — M19042 Primary osteoarthritis, left hand: Secondary | ICD-10-CM | POA: Diagnosis not present

## 2022-09-15 MED ORDER — PREGABALIN 50 MG PO CAPS: 50.0000 mg | ORAL_CAPSULE | Freq: Two times a day (BID) | ORAL | 3 refills | Status: DC
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
## 2022-09-29 ENCOUNTER — Other Ambulatory Visit: Payer: Self-pay | Admitting: Family Medicine

## 2022-09-29 DIAGNOSIS — R21 Rash and other nonspecific skin eruption: Secondary | ICD-10-CM

## 2022-10-02 DIAGNOSIS — M797 Fibromyalgia: Secondary | ICD-10-CM | POA: Diagnosis not present

## 2022-10-02 DIAGNOSIS — M47816 Spondylosis without myelopathy or radiculopathy, lumbar region: Secondary | ICD-10-CM | POA: Diagnosis not present

## 2022-10-09 ENCOUNTER — Telehealth: Payer: Self-pay | Admitting: Family Medicine

## 2022-10-09 ENCOUNTER — Telehealth: Payer: Self-pay | Admitting: *Deleted

## 2022-10-09 DIAGNOSIS — M958 Other specified acquired deformities of musculoskeletal system: Secondary | ICD-10-CM

## 2022-10-09 DIAGNOSIS — Q740 Other congenital malformations of upper limb(s), including shoulder girdle: Secondary | ICD-10-CM

## 2022-10-09 NOTE — Telephone Encounter (Signed)
FYI patient has discontinued Lyrica. She says it wasn't helping and she has gained 7lbs.

## 2022-10-09 NOTE — Telephone Encounter (Signed)
Patient called asking she has not yet went to get her xray done of the collar bone, does patient still need to go get this done, needs an order. Patient call back # [phone]

## 2022-10-09 NOTE — Telephone Encounter (Signed)
Patient called asking if Dr Lodema Hong could send in a prescription blood pressure machine cuff and a walking cane to help with her balance   Pharmacy: [facility]

## 2022-10-10 ENCOUNTER — Other Ambulatory Visit: Payer: Self-pay

## 2022-10-10 DIAGNOSIS — G8929 Other chronic pain: Secondary | ICD-10-CM

## 2022-10-10 DIAGNOSIS — I10 Essential (primary) hypertension: Secondary | ICD-10-CM

## 2022-10-10 MED ORDER — BLOOD PRESSURE KIT: PACK | 0 refills | Status: AC

## 2022-10-11 ENCOUNTER — Other Ambulatory Visit: Payer: Self-pay | Admitting: Family Medicine

## 2022-10-11 DIAGNOSIS — R21 Rash and other nonspecific skin eruption: Secondary | ICD-10-CM

## 2022-10-11 NOTE — Telephone Encounter (Signed)
Lvm letting patient know. 

## 2022-10-11 NOTE — Telephone Encounter (Signed)
Patient reports the lump is on the right side of her clavicle but would like the whole thing xray if possible

## 2022-10-11 NOTE — Telephone Encounter (Signed)
Both sent 

## 2022-10-12 DIAGNOSIS — M797 Fibromyalgia: Secondary | ICD-10-CM | POA: Diagnosis not present

## 2022-10-12 DIAGNOSIS — M47816 Spondylosis without myelopathy or radiculopathy, lumbar region: Secondary | ICD-10-CM | POA: Diagnosis not present

## 2022-10-19 ENCOUNTER — Telehealth: Payer: Self-pay | Admitting: Family Medicine

## 2022-10-19 NOTE — Telephone Encounter (Signed)
Pt called in regard to fall . Patient fell running from snap wants to see if provider can send in order for xray on L hip and R wrist .  Wants a call back in regard

## 2022-10-22 ENCOUNTER — Other Ambulatory Visit: Payer: Self-pay | Admitting: Physical Medicine & Rehabilitation

## 2022-10-24 ENCOUNTER — Ambulatory Visit (HOSPITAL_COMMUNITY)
Admission: RE | Admit: 2022-10-24 | Discharge: 2022-10-24 | Disposition: A | Discharge status: Home / Self Care | Payer: Medicare HMO | Source: Ambulatory Visit | Attending: Family Medicine | Admitting: Family Medicine

## 2022-10-24 DIAGNOSIS — Q740 Other congenital malformations of upper limb(s), including shoulder girdle: Secondary | ICD-10-CM | POA: Diagnosis not present

## 2022-10-24 DIAGNOSIS — M958 Other specified acquired deformities of musculoskeletal system: Secondary | ICD-10-CM | POA: Insufficient documentation

## 2022-10-24 DIAGNOSIS — M19011 Primary osteoarthritis, right shoulder: Secondary | ICD-10-CM | POA: Diagnosis not present

## 2022-10-24 DIAGNOSIS — M19012 Primary osteoarthritis, left shoulder: Secondary | ICD-10-CM | POA: Diagnosis not present

## 2022-10-24 NOTE — Telephone Encounter (Signed)
LVM letting patient know.

## 2022-10-25 NOTE — Telephone Encounter (Signed)
Pt LVM 5:01 9/3 saying she received a call from [place] and when she answered it hung up

## 2022-10-25 NOTE — Telephone Encounter (Signed)
lmtrc

## 2022-10-30 ENCOUNTER — Telehealth: Payer: Self-pay | Admitting: Family Medicine

## 2022-10-30 NOTE — Telephone Encounter (Signed)
Pt called returning call for xray results.

## 2022-10-31 NOTE — Telephone Encounter (Signed)
Lmtrc to go over results.

## 2022-11-01 ENCOUNTER — Emergency Department (HOSPITAL_COMMUNITY)
Admission: EM | Admit: 2022-11-01 | Discharge: 2022-11-01 | Disposition: A | Discharge status: Home / Self Care | Payer: Medicare HMO | Attending: Emergency Medicine | Admitting: Emergency Medicine

## 2022-11-01 ENCOUNTER — Emergency Department (HOSPITAL_COMMUNITY): Payer: Medicare HMO

## 2022-11-01 ENCOUNTER — Encounter (HOSPITAL_COMMUNITY): Payer: Self-pay | Admitting: Emergency Medicine

## 2022-11-01 DIAGNOSIS — I10 Essential (primary) hypertension: Secondary | ICD-10-CM | POA: Diagnosis not present

## 2022-11-01 DIAGNOSIS — M19012 Primary osteoarthritis, left shoulder: Secondary | ICD-10-CM | POA: Diagnosis not present

## 2022-11-01 DIAGNOSIS — Z79899 Other long term (current) drug therapy: Secondary | ICD-10-CM | POA: Diagnosis not present

## 2022-11-01 DIAGNOSIS — W010XXA Fall on same level from slipping, tripping and stumbling without subsequent striking against object, initial encounter: Secondary | ICD-10-CM | POA: Diagnosis not present

## 2022-11-01 DIAGNOSIS — S59201A Unspecified physeal fracture of lower end of radius, right arm, initial encounter for closed fracture: Secondary | ICD-10-CM | POA: Diagnosis not present

## 2022-11-01 DIAGNOSIS — Z87891 Personal history of nicotine dependence: Secondary | ICD-10-CM | POA: Insufficient documentation

## 2022-11-01 DIAGNOSIS — Z7951 Long term (current) use of inhaled steroids: Secondary | ICD-10-CM | POA: Diagnosis not present

## 2022-11-01 DIAGNOSIS — S52501A Unspecified fracture of the lower end of right radius, initial encounter for closed fracture: Secondary | ICD-10-CM

## 2022-11-01 DIAGNOSIS — M1811 Unilateral primary osteoarthritis of first carpometacarpal joint, right hand: Secondary | ICD-10-CM | POA: Diagnosis not present

## 2022-11-01 DIAGNOSIS — Z79890 Hormone replacement therapy (postmenopausal): Secondary | ICD-10-CM | POA: Diagnosis not present

## 2022-11-01 DIAGNOSIS — S52324A Nondisplaced transverse fracture of shaft of right radius, initial encounter for closed fracture: Secondary | ICD-10-CM | POA: Diagnosis not present

## 2022-11-01 DIAGNOSIS — I700 Atherosclerosis of aorta: Secondary | ICD-10-CM | POA: Diagnosis not present

## 2022-11-01 DIAGNOSIS — S4992XA Unspecified injury of left shoulder and upper arm, initial encounter: Secondary | ICD-10-CM | POA: Insufficient documentation

## 2022-11-01 DIAGNOSIS — E039 Hypothyroidism, unspecified: Secondary | ICD-10-CM | POA: Insufficient documentation

## 2022-11-01 DIAGNOSIS — J449 Chronic obstructive pulmonary disease, unspecified: Secondary | ICD-10-CM | POA: Diagnosis not present

## 2022-11-01 DIAGNOSIS — S52591A Other fractures of lower end of right radius, initial encounter for closed fracture: Secondary | ICD-10-CM | POA: Diagnosis not present

## 2022-11-01 DIAGNOSIS — M25512 Pain in left shoulder: Secondary | ICD-10-CM | POA: Diagnosis not present

## 2022-11-01 DIAGNOSIS — M25531 Pain in right wrist: Secondary | ICD-10-CM | POA: Diagnosis present

## 2022-11-01 NOTE — ED Triage Notes (Signed)
Pt fell 1.5 week ago running from a snake and tripped landed on left side and right wrist. Pt states left shoulder and side pain as well as right wrist. Pt ambulated with steady gait into triage. Unable to get appoint with pcp until next week.
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
## 2022-11-01 NOTE — Discharge Instructions (Addendum)
Please follow-up with Dr. Sharlette Dense in regards to your wrist fracture.  Recommend non weight bearing right upper extremity until cleared by orthopedics   You can take over-the-counter acetaminophen or ibuprofen as needed for discomfort  It was a pleasure caring for you today in the emergency department.  Please return to the emergency department for any worsening or worrisome symptoms.

## 2022-11-02 ENCOUNTER — Telehealth: Payer: Self-pay | Admitting: Family Medicine

## 2022-11-02 DIAGNOSIS — M47816 Spondylosis without myelopathy or radiculopathy, lumbar region: Secondary | ICD-10-CM | POA: Diagnosis not present

## 2022-11-02 DIAGNOSIS — M797 Fibromyalgia: Secondary | ICD-10-CM | POA: Diagnosis not present

## 2022-11-02 NOTE — Telephone Encounter (Signed)
Pls call pt and let her know that I am aware and have reviewed her recent ED visit and see that she did inded fracture her wrist, ( she had called just asking for an x ray to be done) Pls arrange asap appt with hand ortho perEd rec re non displaced transverse fracture of right distal radius Thanks

## 2022-11-02 NOTE — Telephone Encounter (Signed)
Patient called back confirm ortho appt.

## 2022-11-02 NOTE — Telephone Encounter (Signed)
Appt scheduled for Tues Sept 17 arrive at 2:15 to [facility] ([facility] office). Voicemail left for Trannie to call me back to confirm the date and time

## 2022-11-06 ENCOUNTER — Other Ambulatory Visit: Payer: Self-pay | Admitting: Physical Medicine & Rehabilitation

## 2022-11-06 ENCOUNTER — Other Ambulatory Visit: Payer: Self-pay | Admitting: Family Medicine

## 2022-11-06 DIAGNOSIS — R21 Rash and other nonspecific skin eruption: Secondary | ICD-10-CM

## 2022-11-07 ENCOUNTER — Ambulatory Visit (INDEPENDENT_AMBULATORY_CARE_PROVIDER_SITE_OTHER): Payer: Medicare HMO | Admitting: Orthopedic Surgery

## 2022-11-07 ENCOUNTER — Encounter: Payer: Self-pay | Admitting: Orthopedic Surgery

## 2022-11-07 VITALS — BP 146/76 | HR 86 | Ht 64.0 in | Wt 164.0 lb

## 2022-11-07 DIAGNOSIS — S52551A Other extraarticular fracture of lower end of right radius, initial encounter for closed fracture: Secondary | ICD-10-CM

## 2022-11-07 MED ORDER — TRAMADOL HCL 50 MG PO TABS
50.0000 mg | ORAL_TABLET | Freq: Two times a day (BID) | ORAL | 0 refills | Status: DC | PRN
Start: 2022-11-07 — End: 2022-12-14

## 2022-11-07 NOTE — Patient Instructions (Signed)
Wear the brace at all times for the next 3 weeks  Ok to remove for hygiene
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
## 2022-11-09 ENCOUNTER — Ambulatory Visit: Payer: Medicare HMO | Admitting: Family Medicine

## 2022-11-12 DIAGNOSIS — M47816 Spondylosis without myelopathy or radiculopathy, lumbar region: Secondary | ICD-10-CM | POA: Diagnosis not present

## 2022-11-12 DIAGNOSIS — M797 Fibromyalgia: Secondary | ICD-10-CM | POA: Diagnosis not present

## 2022-11-21 ENCOUNTER — Other Ambulatory Visit: Payer: Self-pay | Admitting: Family Medicine

## 2022-11-28 ENCOUNTER — Other Ambulatory Visit (INDEPENDENT_AMBULATORY_CARE_PROVIDER_SITE_OTHER): Payer: Medicare HMO

## 2022-11-28 ENCOUNTER — Ambulatory Visit (INDEPENDENT_AMBULATORY_CARE_PROVIDER_SITE_OTHER): Payer: Medicare HMO | Admitting: Orthopedic Surgery

## 2022-11-28 ENCOUNTER — Encounter: Payer: Self-pay | Admitting: Orthopedic Surgery

## 2022-11-28 DIAGNOSIS — S52551A Other extraarticular fracture of lower end of right radius, initial encounter for closed fracture: Secondary | ICD-10-CM

## 2022-11-28 DIAGNOSIS — S52551D Other extraarticular fracture of lower end of right radius, subsequent encounter for closed fracture with routine healing: Secondary | ICD-10-CM

## 2022-11-28 NOTE — Progress Notes (Signed)
Return Patient Visit  Assessment: Andrea Santiago is a 82 y.o. female with the following: 1. Other closed extra-articular fracture of distal end of right radius, subsequent encounter  Plan: Shruthi F Kuriakose fell and sustained a right distal radius fracture, approximately 6 weeks ago.  Radiographs are stable.  Her pain is improving.  She tolerates gentle range of motion.  At this point, her pain is likely related to stiffness.  Encouraged her to get out of the brace, start working on range of motion.  Home exercises were provided.  I would like to see her back in approximately 1 month for repeat evaluation.  Follow-up: Return in about 4 weeks (around 12/26/2022).  Subjective:  Chief Complaint  Patient presents with   Fracture    R wrist DOI 10/10/22    History of Present Illness: Andrea Santiago is a 82 y.o. female who returns for evaluation of right wrist pain.  She is right-hand dominant.  She fell and injured her right wrist approximately 6 weeks ago.  She has been in a brace.  Limited motion and use of the right hand.  She still notes some pain and stiffness.  No numbness or tingling.  Review of Systems: No fevers or chills No numbness or tingling No chest pain No shortness of breath No bowel or bladder dysfunction No GI distress No headaches   Objective: There were no vitals taken for this visit.  Physical Exam:  General: Elderly female., Alert and oriented., and No acute distress. Gait: Normal gait.  Right wrist with mild bruising.  Mild swelling.  She tolerates near full extension.  She has some stiffness on flexion.  She is able to make a full fist.  Fingers warm well-perfused.  Sensation intact throughout the right hand.   IMAGING: I personally ordered and reviewed the following images  X-rays of the right wrist were obtained in clinic today.  These are compared to prior x-rays.  Distal radius fracture is appreciated.  There is no obvious intra-articular extension.   There has been some callus formation.  No interval displacement.  Impression: Healed right distal radius fracture, without intra-articular involvement   New Medications:  No orders of the defined types were placed in this encounter.     Oliver Barre, MD  11/28/2022 1:51 PM

## 2022-11-28 NOTE — Patient Instructions (Signed)
Wrist Fracture Rehab Ask your health care provider which exercises are safe for you. Do exercises exactly as told by your health care provider and adjust them as directed. It is normal to feel mild stretching, pulling, tightness, or discomfort as you do these exercises. Stop right away if you feel sudden pain or your pain gets worse. Do not begin these exercises until told by your health care provider. Stretching and range-of-motion exercises These exercises warm up your muscles and joints and improve the movement and flexibility of your wrist and hand. These exercises also help to relieve pain,numbness, and tingling. Finger flexion and extension Sit or stand with your elbow at your side. Open and stretch your left / right fingers as wide as you can (extension). Hold this position for 10 seconds. Close your left / right fingers into a gentle fist (flexion). Hold this position for 10 seconds. Slowly return to the starting position. Repeat 10 times. Complete this exercise 1-2 times a day. Wrist flexion Bend your left / right elbow to a 90-degree angle (right angle) with your palm facing the floor. Bend your wrist forward so your fingers point toward the floor (flexion). Hold this position for 10 seconds. Slowly return to the starting position. Repeat 10 times. Complete this exercise 1-2 times a day. Wrist extension Bend your left / right elbow to a 90-degree angle (right angle) with your palm facing the floor. Bend your wrist backward so your fingers point toward the ceiling (extension). Hold this position for 10 seconds. Slowly return to the starting position. Repeat 10 times. Complete this exercise 1-2 times a day. Ulnar deviation Bend your left / right elbow to a 90-degree angle (right angle), and rest your forearm on a table with your palm facing down. Keeping your hand flat on the table, bend your left / right wrist toward your small finger (pinkie). This is ulnar deviation. Hold this  position for 10 seconds. Slowly return to the starting position. Repeat 10 times. Complete this exercise 1-2 times a day. Radial deviation Bend your left / right elbow to a 90-degree angle (right angle), and rest your forearm on a table with your palm facing down. Keeping your hand flat on the table, bend your left / right wrist toward your thumb. This is radial deviation. Hold this position for 10 seconds. Slowly return to the starting position. Repeat 10 times. Complete this exercise 1-2 times a day. Forearm rotation, supination Stand or sit with your left / right elbow bent to a 90-degree angle (right angle) at your side. Position your forearm so that the thumb is facing the ceiling (neutral position). Turn (rotate) your palm up toward the ceiling (supination), stopping when you feel a gentle stretch. Hold this position for 10 seconds. Slowly return to the starting position. Repeat 10 times. Complete this exercise 1-2 times a day. Forearm rotation, pronation Stand or sit with your left / right elbow bent to a 90-degree angle (right angle) at your side. Position your forearm so that the thumb is facing the ceiling (neutral position). Turn (rotate) your palm down toward the floor (pronation), stopping when you feel a gentle stretch. Hold this position for 10 seconds. Slowly return to the starting position. Repeat 10 times. Complete this exercise 1-2 times a day. Wrist flexion stretch  Extend your left / right arm in front of you and turn your palm down toward the floor. If told by your health care provider, bend your left / right arm to a 90-degree angle (  right angle) at your side. Using your uninjured hand, gently press over the back of your left / right hand to bend your wrist and fingers toward the floor (flexion). Go as far as you can to feel a stretch without causing pain. Hold this position for 10 seconds. Slowly return to the starting position. Repeat 10 times. Complete this  exercise 1-2 times a day. Wrist extension stretch  Extend your left / right arm in front of you and turn your palm up toward the ceiling. If told by your health care provider, bend your left / right arm to a 90-degree angle (right angle) at your side. Using your uninjured hand, gently press over the palm of your left / right hand to bend your wrist and fingers toward the floor (extension). Go as far as you can to feel a stretch without causing pain. Hold this position for 10 seconds. Slowly return to the starting position. Repeat 10 times. Complete this exercise 1-2 times a day. Forearm rotation stretch, supination Stand or sit with your arms at your sides. Bend your left / right elbow to a 90-degree angle (right angle). Using your uninjured hand, turn your left / right palm up toward the ceiling (assisted supination) until you feel a gentle stretch in the inside of your forearm. Hold this position for 10 seconds. Slowly return to the starting position. Repeat 10 times. Complete this exercise 1-2 times a day. Forearm rotation stretch, pronation Stand or sit with your arms at your sides. Bend your left / right elbow to a 90-degree angle (right angle). Using your uninjured hand, turn your left / right palm down toward the floor (assisted pronation) until you feel a gentle stretch in the top of your forearm. Hold this position for 10 seconds. Slowly return to the starting position. Repeat 10 times. Complete this exercise 1-2 times a day. Strengthening exercises These exercises build strength and endurance in your wrist and hand. Enduranceis the ability to use your muscles for a long time, even after they get tired. Wrist flexion Sit with your left / right forearm supported on a table. Your elbow should be at waist height. Rest your hand over the edge of the table, palm up. Gently grasp a 5 lb / kg weight (can of soup). Or, hold an exercise band or tube in both hands, keeping your hands at  the same level and hip distance apart. There should be slight tension in the exercise band or tube. Without moving your forearm or elbow, slowly bend your wrist up toward the ceiling (wrist flexion). Hold this position for 10 seconds. Slowly return to the starting position. Repeat 10 times. Complete this exercise 1-2 times a day. Wrist extension Sit with your left / right forearm supported on a table. Your elbow should be at waist height. Rest your hand over the edge of the table, palm down. Gently grasp a 5 lb / kg weight. Or, hold an exercise band or tube in both hands, keeping your hands at the same level and hip distance apart. There should be slight tension in the exercise band or tube. Without moving your forearm or elbow, slowly curl your hand up toward the ceiling (extension). Hold this position for 10 seconds. Slowly return to the starting position. Repeat 10 times. Complete this exercise 1-2 times a day. Forearm rotation, supination  Sit with your left / right forearm supported on a table. Your elbow should be at waist height. Rest your hand over the edge of the  table, palm down. Gently grasp a lightweight hammer near the head. As this exercise gets easier for you, try holding the hammer farther down the handle. Without moving your elbow, slowly turn (rotate) your palm up toward the ceiling (supination). Hold this position for 10 seconds. Slowly return to the starting position. Repeat 10 times. Complete this exercise 1-2 times a day. Forearm rotation, pronation  Sit with your left / right forearm supported on a table. Your elbow should be at waist height. Rest your hand over the edge of the table, palm up. Gently grasp a lightweight hammer near the head. As this exercise gets easier for you, try holding the hammer farther down the handle. Without moving your elbow, slowly turn (rotate) your palm down toward the floor (pronation). Hold this position for 10 seconds. Slowly return  to the starting position. Repeat 10 times. Complete this exercise 1-2 times a day. Grip strengthening  Hold one of these items in your left / right hand: a dense sponge, a stress ball, or a large, rolled sock. Slowly squeeze the object as hard as you can without increasing any pain. Hold your squeeze for 10 seconds. Slowly release your grip. Repeat 10 times. Complete this exercise 1-2 times a day. This information is not intended to replace advice given to you by your health care provider. Make sure you discuss any questions you have with your healthcare provider. Document Revised: 06/19/2019 Document Reviewed: 06/19/2019 Else[employer] Patient Education  [employer].

## 2022-12-02 DIAGNOSIS — M797 Fibromyalgia: Secondary | ICD-10-CM | POA: Diagnosis not present

## 2022-12-02 DIAGNOSIS — M47816 Spondylosis without myelopathy or radiculopathy, lumbar region: Secondary | ICD-10-CM | POA: Diagnosis not present

## 2022-12-08 DIAGNOSIS — I10 Essential (primary) hypertension: Secondary | ICD-10-CM | POA: Diagnosis not present

## 2022-12-08 DIAGNOSIS — E782 Mixed hyperlipidemia: Secondary | ICD-10-CM | POA: Diagnosis not present

## 2022-12-08 DIAGNOSIS — E039 Hypothyroidism, unspecified: Secondary | ICD-10-CM | POA: Diagnosis not present

## 2022-12-08 DIAGNOSIS — R7303 Prediabetes: Secondary | ICD-10-CM | POA: Diagnosis not present

## 2022-12-09 LAB — CBC
Hematocrit: 42.2 % (ref 34.0–46.6)
Hemoglobin: 13.7 g/dL (ref 11.1–15.9)
MCH: 30.2 pg (ref 26.6–33.0)
MCHC: 32.5 g/dL (ref 31.5–35.7)
MCV: 93 fL (ref 79–97)
Platelets: 280 10*3/uL (ref 150–450)
RBC: 4.54 x10E6/uL (ref 3.77–5.28)
RDW: 12.8 % (ref 11.7–15.4)
WBC: 9.5 10*3/uL (ref 3.4–10.8)

## 2022-12-09 LAB — LIPID PANEL
Chol/HDL Ratio: 4.6 {ratio} — ABNORMAL HIGH (ref 0.0–4.4)
Cholesterol, Total: 164 mg/dL (ref 100–199)
HDL: 36 mg/dL — ABNORMAL LOW (ref >39)
LDL Chol Calc (NIH): 89 mg/dL (ref 0–99)
Triglycerides: 228 mg/dL — ABNORMAL HIGH (ref 0–149)
VLDL Cholesterol Cal: 39 mg/dL (ref 5–40)

## 2022-12-09 LAB — CMP14+EGFR
ALT: 23 [IU]/L (ref 0–32)
AST: 24 [IU]/L (ref 0–40)
Albumin: 4.5 g/dL (ref 3.7–4.7)
Alkaline Phosphatase: 70 [IU]/L (ref 44–121)
BUN/Creatinine Ratio: 24 (ref 12–28)
BUN: 22 mg/dL (ref 8–27)
Bilirubin Total: 0.3 mg/dL (ref 0.0–1.2)
CO2: 21 mmol/L (ref 20–29)
Calcium: 9.7 mg/dL (ref 8.7–10.3)
Chloride: 103 mmol/L (ref 96–106)
Creatinine, Ser: 0.91 mg/dL (ref 0.57–1.00)
Globulin, Total: 2.7 g/dL (ref 1.5–4.5)
Glucose: 114 mg/dL — ABNORMAL HIGH (ref 70–99)
Potassium: 4.2 mmol/L (ref 3.5–5.2)
Sodium: 139 mmol/L (ref 134–144)
Total Protein: 7.2 g/dL (ref 6.0–8.5)
eGFR: 62 mL/min/{1.73_m2} (ref >59)

## 2022-12-09 LAB — TSH: TSH: 4.31 u[IU]/mL (ref 0.450–4.500)

## 2022-12-09 LAB — HEMOGLOBIN A1C
Est. average glucose Bld gHb Est-mCnc: 128 mg/dL
Hgb A1c MFr Bld: 6.1 % — ABNORMAL HIGH (ref 4.8–5.6)

## 2022-12-12 ENCOUNTER — Other Ambulatory Visit (INDEPENDENT_AMBULATORY_CARE_PROVIDER_SITE_OTHER): Payer: Medicare HMO

## 2022-12-12 ENCOUNTER — Encounter: Payer: Self-pay | Admitting: Orthopedic Surgery

## 2022-12-12 ENCOUNTER — Ambulatory Visit (INDEPENDENT_AMBULATORY_CARE_PROVIDER_SITE_OTHER): Payer: Medicare HMO | Admitting: Orthopedic Surgery

## 2022-12-12 DIAGNOSIS — S52551D Other extraarticular fracture of lower end of right radius, subsequent encounter for closed fracture with routine healing: Secondary | ICD-10-CM

## 2022-12-12 DIAGNOSIS — M47816 Spondylosis without myelopathy or radiculopathy, lumbar region: Secondary | ICD-10-CM | POA: Diagnosis not present

## 2022-12-12 DIAGNOSIS — M797 Fibromyalgia: Secondary | ICD-10-CM | POA: Diagnosis not present

## 2022-12-12 NOTE — Patient Instructions (Signed)
Wrist Fracture Rehab Ask your health care provider which exercises are safe for you. Do exercises exactly as told by your health care provider and adjust them as directed. It is normal to feel mild stretching, pulling, tightness, or discomfort as you do these exercises. Stop right away if you feel sudden pain or your pain gets worse. Do not begin these exercises until told by your health care provider. Stretching and range-of-motion exercises These exercises warm up your muscles and joints and improve the movement and flexibility of your wrist and hand. These exercises also help to relieve pain,numbness, and tingling. Finger flexion and extension Sit or stand with your elbow at your side. Open and stretch your left / right fingers as wide as you can (extension). Hold this position for 10 seconds. Close your left / right fingers into a gentle fist (flexion). Hold this position for 10 seconds. Slowly return to the starting position. Repeat 10 times. Complete this exercise 1-2 times a day. Wrist flexion Bend your left / right elbow to a 90-degree angle (right angle) with your palm facing the floor. Bend your wrist forward so your fingers point toward the floor (flexion). Hold this position for 10 seconds. Slowly return to the starting position. Repeat 10 times. Complete this exercise 1-2 times a day. Wrist extension Bend your left / right elbow to a 90-degree angle (right angle) with your palm facing the floor. Bend your wrist backward so your fingers point toward the ceiling (extension). Hold this position for 10 seconds. Slowly return to the starting position. Repeat 10 times. Complete this exercise 1-2 times a day. Ulnar deviation Bend your left / right elbow to a 90-degree angle (right angle), and rest your forearm on a table with your palm facing down. Keeping your hand flat on the table, bend your left / right wrist toward your small finger (pinkie). This is ulnar deviation. Hold this  position for 10 seconds. Slowly return to the starting position. Repeat 10 times. Complete this exercise 1-2 times a day. Radial deviation Bend your left / right elbow to a 90-degree angle (right angle), and rest your forearm on a table with your palm facing down. Keeping your hand flat on the table, bend your left / right wrist toward your thumb. This is radial deviation. Hold this position for 10 seconds. Slowly return to the starting position. Repeat 10 times. Complete this exercise 1-2 times a day. Forearm rotation, supination Stand or sit with your left / right elbow bent to a 90-degree angle (right angle) at your side. Position your forearm so that the thumb is facing the ceiling (neutral position). Turn (rotate) your palm up toward the ceiling (supination), stopping when you feel a gentle stretch. Hold this position for 10 seconds. Slowly return to the starting position. Repeat 10 times. Complete this exercise 1-2 times a day. Forearm rotation, pronation Stand or sit with your left / right elbow bent to a 90-degree angle (right angle) at your side. Position your forearm so that the thumb is facing the ceiling (neutral position). Turn (rotate) your palm down toward the floor (pronation), stopping when you feel a gentle stretch. Hold this position for 10 seconds. Slowly return to the starting position. Repeat 10 times. Complete this exercise 1-2 times a day. Wrist flexion stretch  Extend your left / right arm in front of you and turn your palm down toward the floor. If told by your health care provider, bend your left / right arm to a 90-degree angle (  right angle) at your side. Using your uninjured hand, gently press over the back of your left / right hand to bend your wrist and fingers toward the floor (flexion). Go as far as you can to feel a stretch without causing pain. Hold this position for 10 seconds. Slowly return to the starting position. Repeat 10 times. Complete this  exercise 1-2 times a day. Wrist extension stretch  Extend your left / right arm in front of you and turn your palm up toward the ceiling. If told by your health care provider, bend your left / right arm to a 90-degree angle (right angle) at your side. Using your uninjured hand, gently press over the palm of your left / right hand to bend your wrist and fingers toward the floor (extension). Go as far as you can to feel a stretch without causing pain. Hold this position for 10 seconds. Slowly return to the starting position. Repeat 10 times. Complete this exercise 1-2 times a day. Forearm rotation stretch, supination Stand or sit with your arms at your sides. Bend your left / right elbow to a 90-degree angle (right angle). Using your uninjured hand, turn your left / right palm up toward the ceiling (assisted supination) until you feel a gentle stretch in the inside of your forearm. Hold this position for 10 seconds. Slowly return to the starting position. Repeat 10 times. Complete this exercise 1-2 times a day. Forearm rotation stretch, pronation Stand or sit with your arms at your sides. Bend your left / right elbow to a 90-degree angle (right angle). Using your uninjured hand, turn your left / right palm down toward the floor (assisted pronation) until you feel a gentle stretch in the top of your forearm. Hold this position for 10 seconds. Slowly return to the starting position. Repeat 10 times. Complete this exercise 1-2 times a day. Strengthening exercises These exercises build strength and endurance in your wrist and hand. Enduranceis the ability to use your muscles for a long time, even after they get tired. Wrist flexion Sit with your left / right forearm supported on a table. Your elbow should be at waist height. Rest your hand over the edge of the table, palm up. Gently grasp a 5 lb / kg weight (can of soup). Or, hold an exercise band or tube in both hands, keeping your hands at  the same level and hip distance apart. There should be slight tension in the exercise band or tube. Without moving your forearm or elbow, slowly bend your wrist up toward the ceiling (wrist flexion). Hold this position for 10 seconds. Slowly return to the starting position. Repeat 10 times. Complete this exercise 1-2 times a day. Wrist extension Sit with your left / right forearm supported on a table. Your elbow should be at waist height. Rest your hand over the edge of the table, palm down. Gently grasp a 5 lb / kg weight. Or, hold an exercise band or tube in both hands, keeping your hands at the same level and hip distance apart. There should be slight tension in the exercise band or tube. Without moving your forearm or elbow, slowly curl your hand up toward the ceiling (extension). Hold this position for 10 seconds. Slowly return to the starting position. Repeat 10 times. Complete this exercise 1-2 times a day. Forearm rotation, supination  Sit with your left / right forearm supported on a table. Your elbow should be at waist height. Rest your hand over the edge of the  table, palm down. Gently grasp a lightweight hammer near the head. As this exercise gets easier for you, try holding the hammer farther down the handle. Without moving your elbow, slowly turn (rotate) your palm up toward the ceiling (supination). Hold this position for 10 seconds. Slowly return to the starting position. Repeat 10 times. Complete this exercise 1-2 times a day. Forearm rotation, pronation  Sit with your left / right forearm supported on a table. Your elbow should be at waist height. Rest your hand over the edge of the table, palm up. Gently grasp a lightweight hammer near the head. As this exercise gets easier for you, try holding the hammer farther down the handle. Without moving your elbow, slowly turn (rotate) your palm down toward the floor (pronation). Hold this position for 10 seconds. Slowly return  to the starting position. Repeat 10 times. Complete this exercise 1-2 times a day. Grip strengthening  Hold one of these items in your left / right hand: a dense sponge, a stress ball, or a large, rolled sock. Slowly squeeze the object as hard as you can without increasing any pain. Hold your squeeze for 10 seconds. Slowly release your grip. Repeat 10 times. Complete this exercise 1-2 times a day. This information is not intended to replace advice given to you by your health care provider. Make sure you discuss any questions you have with your healthcare provider. Document Revised: 06/19/2019 Document Reviewed: 06/19/2019 Elsevier Patient Education  2022 [employer]     Hand Exercises  Hand exercises can be helpful for almost anyone. These exercises can strengthen the hands, improve flexibility and movement, and increase blood flow to the hands. These results can make work and daily tasks easier. Hand exercises can be especially helpful for people who have joint pain from arthritis or have nerve damage from overuse (carpal tunnel syndrome). These exercises can also help people who have injured a hand.  Exercises Most of these hand exercises are gentle stretching and motion exercises. It is usually safe to do them often throughout the day. Warming up your hands before exercise may help to reduce stiffness. You can do this with gentle massage or by placing your hands in warm water for 10-15 minutes. It is normal to feel some stretching, pulling, tightness, or mild discomfort as you begin new exercises. This will gradually improve. Stop an exercise right away if you feel sudden, severe pain or your pain gets worse. Ask your health care provider which exercises are best for you. Knuckle bend or "claw" fist Stand or sit with your arm, hand, and all five fingers pointed straight up. Make sure to keep your wrist straight during the exercise. Gently bend your fingers down toward your palm until  the tips of your fingers are touching the top of your palm. Keep your big knuckle straight and just bend the small knuckles in your fingers. Hold this position for 10 seconds. Straighten (extend) your fingers back to the starting position. Repeat this exercise 5-10 times with each hand. Full finger fist Stand or sit with your arm, hand, and all five fingers pointed straight up. Make sure to keep your wrist straight during the exercise. Gently bend your fingers into your palm until the tips of your fingers are touching the middle of your palm. Hold this position for 10 seconds. Extend your fingers back to the starting position, stretching every joint fully. Repeat this exercise 5-10 times with each hand. Straight fist Stand or sit with your arm, hand,  and all five fingers pointed straight up. Make sure to keep your wrist straight during the exercise. Gently bend your fingers at the big knuckle, where your fingers meet your hand, and the middle knuckle. Keep the knuckle at the tips of your fingers straight and try to touch the bottom of your palm. Hold this position for 10 seconds. Extend your fingers back to the starting position, stretching every joint fully. Repeat this exercise 5-10 times with each hand. Tabletop Stand or sit with your arm, hand, and all five fingers pointed straight up. Make sure to keep your wrist straight during the exercise. Gently bend your fingers at the big knuckle, where your fingers meet your hand, as far down as you can while keeping the small knuckles in your fingers straight. Think of forming a tabletop with your fingers. Hold this position for 10 seconds. Extend your fingers back to the starting position, stretching every joint fully. Repeat this exercise 5-10 times with each hand. Finger spread Place your hand flat on a table with your palm facing down. Make sure your wrist stays straight as you do this exercise. Spread your fingers and thumb apart from each  other as far as you can until you feel a gentle stretch. Hold this position for 10 seconds. Bring your fingers and thumb tight together again. Hold this position for 10 seconds. Repeat this exercise 5-10 times with each hand. Making circles Stand or sit with your arm, hand, and all five fingers pointed straight up. Make sure to keep your wrist straight during the exercise. Make a circle by touching the tip of your thumb to the tip of your index finger. Hold for 10 seconds. Then open your hand wide. Repeat this motion with your thumb and each finger on your hand. Repeat this exercise 5-10 times with each hand. Thumb motion Sit with your forearm resting on a table and your wrist straight. Your thumb should be facing up toward the ceiling. Keep your fingers relaxed as you move your thumb. Lift your thumb up as high as you can toward the ceiling. Hold for 10 seconds. Bend your thumb across your palm as far as you can, reaching the tip of your thumb for the small finger (pinkie) side of your palm. Hold for 10 seconds. Repeat this exercise 5-10 times with each hand. Grip strengthening Hold a stress ball or other soft ball in the middle of your hand. Slowly increase the pressure, squeezing the ball as much as you can without causing pain. Think of bringing the tips of your fingers into the middle of your palm. All of your finger joints should bend when doing this exercise. Hold your squeeze for 10 seconds, then relax. Repeat this exercise 5-10 times with each hand.   Contact a health care provider if: Your hand pain or discomfort gets much worse when you do an exercise. Your hand pain or discomfort does not improve within 2 hours after you exercise. If you have any of these problems, stop doing these exercises right away. Do not do them again unless your health care provider says that you can.    Get help right away if: You develop sudden, severe hand pain or swelling. If this happens, stop  doing these exercises right away. Do not do them again unless your health care provider says that you can. This information is not intended to replace advice given to you by your health care provider. Make sure you discuss any questions you have with your  health care provider.

## 2022-12-12 NOTE — Progress Notes (Signed)
Return Patient Visit  Assessment: Andrea Santiago is a 82 y.o. female with the following: 1. Other closed extra-articular fracture of distal end of right radius, subsequent encounter  Plan: Andrea Santiago fell and sustained a right distal radius fracture, over 2 months ago.  Radiographs remain unchanged.  Her pain continues to improve, but she does continue to have some stiffness.  I have urged her to remain out of the brace as much as possible.  I provided her with wrist and hand exercises for her to initiate.  Anticipate that with continued use, she will improve her strength and overall function.  If she continues to have issues, she will return to clinic.  Otherwise, follow-up as needed.   Follow-up: Return if symptoms worsen or fail to improve.  Subjective:  Chief Complaint  Patient presents with   Fracture    R wrist DOI 10/10/22    History of Present Illness: Andrea Santiago is a 82 y.o. female who returns for evaluation of right wrist pain.  She is right-hand dominant.  She fell and injured her right wrist approximately 10 weeks ago.  She continues to use a brace.  Her motion is improving, but she does have some discomfort.  Fingers are warm and well-perfused.  She feels better.   Review of Systems: No fevers or chills No numbness or tingling No chest pain No shortness of breath No bowel or bladder dysfunction No GI distress No headaches   Objective: There were no vitals taken for this visit.  Physical Exam:  General: Elderly female., Alert and oriented., and No acute distress. Gait: Normal gait.  Right wrist without swelling.  No bruising.  She tolerates flexion extension up to 60 degrees in both directions.  She is able to make a full fist.  No point tenderness.  Fingers warm well-perfused.  Sensation intact throughout the right hand.   IMAGING: I personally ordered and reviewed the following images  X-rays of the right wrist obtained in clinic today.  These are  compared to available x-rays.  There is no change in alignment of the distal radius fracture.  There has been interval consolidation.  No interval displacement.  No obvious callus formation.  No other injuries have been noted.  No bony lesions.  Impression: Stable right distal radius fracture without further displacement.  New Medications:  No orders of the defined types were placed in this encounter.     Oliver Barre, MD  12/12/2022 11:48 AM

## 2022-12-14 ENCOUNTER — Encounter: Payer: Self-pay | Admitting: Family Medicine

## 2022-12-14 ENCOUNTER — Ambulatory Visit (INDEPENDENT_AMBULATORY_CARE_PROVIDER_SITE_OTHER): Payer: Medicare HMO | Admitting: Family Medicine

## 2022-12-14 VITALS — BP 128/72 | HR 84 | Ht 64.0 in | Wt 168.0 lb

## 2022-12-14 DIAGNOSIS — R7303 Prediabetes: Secondary | ICD-10-CM

## 2022-12-14 DIAGNOSIS — Z0001 Encounter for general adult medical examination with abnormal findings: Secondary | ICD-10-CM | POA: Diagnosis not present

## 2022-12-14 DIAGNOSIS — F324 Major depressive disorder, single episode, in partial remission: Secondary | ICD-10-CM | POA: Diagnosis not present

## 2022-12-14 DIAGNOSIS — G8929 Other chronic pain: Secondary | ICD-10-CM

## 2022-12-14 DIAGNOSIS — E782 Mixed hyperlipidemia: Secondary | ICD-10-CM

## 2022-12-14 DIAGNOSIS — E65 Localized adiposity: Secondary | ICD-10-CM

## 2022-12-14 DIAGNOSIS — I10 Essential (primary) hypertension: Secondary | ICD-10-CM | POA: Diagnosis not present

## 2022-12-14 DIAGNOSIS — Z780 Asymptomatic menopausal state: Secondary | ICD-10-CM | POA: Diagnosis not present

## 2022-12-14 DIAGNOSIS — E663 Overweight: Secondary | ICD-10-CM

## 2022-12-14 DIAGNOSIS — M8589 Other specified disorders of bone density and structure, multiple sites: Secondary | ICD-10-CM

## 2022-12-14 DIAGNOSIS — M797 Fibromyalgia: Secondary | ICD-10-CM

## 2022-12-14 MED ORDER — KETOROLAC TROMETHAMINE 60 MG/2ML IM SOLN
30.0000 mg | Freq: Once | INTRAMUSCULAR | Status: AC
Start: 2022-12-14 — End: 2022-12-14
  Administered 2022-12-14: 30 mg via INTRAMUSCULAR

## 2022-12-14 MED ORDER — UNABLE TO FIND: 0 refills | Status: DC

## 2022-12-14 NOTE — Patient Instructions (Addendum)
F/U in 3 months, call if you need me sooner  Toradol 30 mg IM in office today for pain  Please schedule dexa at checkout ( nurse pls order and let her know this is being done)  You are being referred to Nutritionist. And  To therapist and please check the [facility] FOR EXERCISE CLASSES (NURSE PLEASE check on direct referral and let pt know)  Please cut back on fried and fatty foods, pls make sure that you are taking all 3 medications for your cholesterol  Need TdAP nurse pls print rx for her to take to the pharmacy  Thanks for choosing [facility], we consider it a privelige to serve you.

## 2022-12-14 NOTE — Progress Notes (Signed)
Andrea Santiago     MRN: [id number]      DOB: 05-24-1940  Chief Complaint  Patient presents with   Annual Exam    CPE  requesting something for pain     HPI: Patient is in for annual physical exam. C/o increased and uncontrolled generalized pain from fibromyalgia x 1 week C/o depression, often feels as though  she has nothing to live for, her 2 sons committed suicide, interested in therapy and in joining a group Fracture of right forearm is healing by intent Immunization is reviewed , and  updated if needed. Patient and/or legal guardian verbally consented to [facility] services about presenting concerns and psychiatric consultation as appropriate.  The services will be billed as appropriate for the patient    PE: BP 128/72 (BP Location: Right Arm, Patient Position: Sitting, Cuff Size: Large)   Pulse 84   Ht 5\' 4"  (1.626 m)   Wt 168 lb (76.2 kg)   SpO2 97%   BMI 28.84 kg/m   Pleasant  female, alert and oriented x 3, in no cardio-pulmonary distress. Afebrile. HEENT No facial trauma or asymetry. Sinuses non tender.  Extra occullar muscles intact.. External ears normal, . Neck: decreased rOM, no adenopathy,JVD or thyromegaly.No bruits.  Chest: Clear to ascultation bilaterally.No crackles or wheezes. Non tender to palpation    Cardiovascular system; Heart sounds normal,  S1 and  S2 ,no S3.  No murmur, or thrill. Apical beat not displaced Peripheral pulses normal.  Abdomen: Soft, non tender, no organomegaly or masses.Obese No bruits. Bowel sounds normal. No guarding, tenderness or rebound.    Musculoskeletal exam: Decreased ROM of spine, hips , shoulders and knees. No deformity ,swelling or crepitus noted. No muscle wasting or atrophy.   Neurologic: Cranial nerves 2 to 12 intact. Power, tone ,sensation  normal throughout. disturbance in gait. No tremor.  Skin: Intact, no ulceration, erythema , scaling or rash  noted. Pigmentation normal throughout  Psych; Mildly depressed mood. Judgement and concentration normal   Assessment & Plan:  Encounter for Medicare annual examination with abnormal findings Annual exam as documented.  Immunization needs  are specifically addressed at this visit.   Depression, major, single episode, in partial remission (HCC) Refer to therapy and also iontersted in a group  Fibromyalgia Current flare, toradol 30 mg iM administered, also recommend and will refer to exercise class at the [facility]  Osteopenia of multiple sites Needs updated dexa  Mixed hyperlipidemia Hyperlipidemia:Low fat diet discussed and encouraged.   Lipid Panel  Lab Results  Component Value Date   CHOL 164 12/08/2022   HDL 36 (L) 12/08/2022   LDLCALC 89 12/08/2022   LDLDIRECT 141 (H) 10/31/2007   TRIG 228 (H) 12/08/2022   CHOLHDL 4.6 (H) 12/08/2022     Not at goal despite polypharmacy, refer  nutrition  Prediabetes Patient educated about the importance of limiting  Carbohydrate intake , the need to commit to daily physical activity for a minimum of 30 minutes , and to commit weight loss. The fact that changes in all these areas will reduce or eliminate all together the development of diabetes is stressed.  Refer nutrition     Latest Ref Rng & Units 12/08/2022   10:36 AM 08/03/2022   11:13 AM 03/31/2022    1:13 PM 01/04/2022   11:47 AM 05/31/2021    3:31 PM  Diabetic Labs  HbA1c 4.8 - 5.6 % 6.1   6.4  6.4  6.3   Chol  100 - 199 mg/dL 161  096  045  409    HDL >39 mg/dL 36  43  40  39    Calc LDL 0 - 99 mg/dL 89  87  811  914    Triglycerides 0 - 149 mg/dL 782  956  213  086    Creatinine 0.57 - 1.00 mg/dL 5.78  4.69  6.29  5.28        12/14/2022    1:34 PM 11/07/2022    2:17 PM 11/01/2022    2:26 PM 11/01/2022    2:25 PM 09/15/2022    2:12 PM 08/17/2022   12:54 PM 08/09/2022   11:17 AM  BP/Weight  Systolic BP 128 146  150 147 133 120  Diastolic BP 72 76  60 79 75 74  Wt.  (Lbs) 168 164 160.94  161.8 166   BMI 28.84 kg/m2 28.15 kg/m2 27.62 kg/m2  27.77 kg/m2 28.49 kg/m2        No data to display            Overweight  Patient re-educated about  the importance of commitment to a  minimum of 150 minutes of exercise per week as able.  The importance of healthy food choices with portion control discussed, as well as eating regularly and within a 12 hour window most days. The need to choose "clean , green" food 50 to 75% of the time is discussed, as well as to make water the primary drink and set a goal of 64 ounces water daily.       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

## 2022-12-15 ENCOUNTER — Encounter: Payer: Medicare HMO | Admitting: Physical Medicine & Rehabilitation

## 2022-12-15 DIAGNOSIS — E65 Localized adiposity: Secondary | ICD-10-CM | POA: Insufficient documentation

## 2022-12-15 NOTE — Assessment & Plan Note (Signed)
Increased CV risk, refer nutrtion

## 2022-12-15 NOTE — Assessment & Plan Note (Signed)
  Patient re-educated about  the importance of commitment to a  minimum of 150 minutes of exercise per week as able.  The importance of healthy food choices with portion control discussed, as well as eating regularly and within a 12 hour window most days. The need to choose "clean , green" food 50 to 75% of the time is discussed, as well as to make water the primary drink and set a goal of 64 ounces water daily.       12/14/2022    1:34 PM 11/07/2022    2:17 PM 11/01/2022    2:26 PM  Weight /BMI  Weight 168 lb 164 lb 160 lb 15 oz  Height 5\' 4"  (1.626 m) 5\' 4"  (1.626 m)   BMI 28.84 kg/m2 28.15 kg/m2 27.62 kg/m2    Deteriorated refer nutrition

## 2022-12-15 NOTE — Assessment & Plan Note (Signed)
Refer to therapy and also iontersted in a group

## 2022-12-15 NOTE — Assessment & Plan Note (Signed)
Current flare, toradol 30 mg iM administered, also recommend and will refer to exercise class at the [facility]

## 2022-12-15 NOTE — Assessment & Plan Note (Signed)
Needs updated dexa

## 2022-12-15 NOTE — Assessment & Plan Note (Signed)
Patient educated about the importance of limiting  Carbohydrate intake , the need to commit to daily physical activity for a minimum of 30 minutes , and to commit weight loss. The fact that changes in all these areas will reduce or eliminate all together the development of diabetes is stressed.  Refer nutrition     Latest Ref Rng & Units 12/08/2022   10:36 AM 08/03/2022   11:13 AM 03/31/2022    1:13 PM 01/04/2022   11:47 AM 05/31/2021    3:31 PM  Diabetic Labs  HbA1c 4.8 - 5.6 % 6.1   6.4  6.4  6.3   Chol 100 - 199 mg/dL 696  295  284  132    HDL >39 mg/dL 36  43  40  39    Calc LDL 0 - 99 mg/dL 89  87  440  102    Triglycerides 0 - 149 mg/dL 725  366  440  347    Creatinine 0.57 - 1.00 mg/dL 4.25  9.56  3.87  5.64        12/14/2022    1:34 PM 11/07/2022    2:17 PM 11/01/2022    2:26 PM 11/01/2022    2:25 PM 09/15/2022    2:12 PM 08/17/2022   12:54 PM 08/09/2022   11:17 AM  BP/Weight  Systolic BP 128 146  150 147 133 120  Diastolic BP 72 76  60 79 75 74  Wt. (Lbs) 168 164 160.94  161.8 166   BMI 28.84 kg/m2 28.15 kg/m2 27.62 kg/m2  27.77 kg/m2 28.49 kg/m2        No data to display

## 2022-12-15 NOTE — Assessment & Plan Note (Signed)
Annual exam as documented.  Immunization needs are specifically addressed at this visit.  

## 2022-12-15 NOTE — Assessment & Plan Note (Signed)
Hyperlipidemia:Low fat diet discussed and encouraged.   Lipid Panel  Lab Results  Component Value Date   CHOL 164 12/08/2022   HDL 36 (L) 12/08/2022   LDLCALC 89 12/08/2022   LDLDIRECT 141 (H) 10/31/2007   TRIG 228 (H) 12/08/2022   CHOLHDL 4.6 (H) 12/08/2022     Not at goal despite polypharmacy, refer  nutrition

## 2022-12-21 ENCOUNTER — Encounter: Payer: Medicare HMO | Attending: Physical Medicine & Rehabilitation | Admitting: Physical Medicine & Rehabilitation

## 2022-12-21 ENCOUNTER — Encounter: Payer: Self-pay | Admitting: Physical Medicine & Rehabilitation

## 2022-12-21 VITALS — BP 124/81 | HR 82 | Ht 64.0 in | Wt 166.0 lb

## 2022-12-21 DIAGNOSIS — M47816 Spondylosis without myelopathy or radiculopathy, lumbar region: Secondary | ICD-10-CM | POA: Insufficient documentation

## 2022-12-21 DIAGNOSIS — M19041 Primary osteoarthritis, right hand: Secondary | ICD-10-CM | POA: Insufficient documentation

## 2022-12-21 DIAGNOSIS — F32A Depression, unspecified: Secondary | ICD-10-CM | POA: Diagnosis not present

## 2022-12-21 DIAGNOSIS — M19042 Primary osteoarthritis, left hand: Secondary | ICD-10-CM | POA: Insufficient documentation

## 2022-12-21 DIAGNOSIS — M797 Fibromyalgia: Secondary | ICD-10-CM | POA: Diagnosis not present

## 2022-12-21 MED ORDER — DULOXETINE HCL 30 MG PO CPEP: 30.0000 mg | ORAL_CAPSULE | Freq: Every evening | ORAL | 0 refills | Status: DC

## 2022-12-21 MED ORDER — AMBULATORY NON FORMULARY MEDICATION: 1 refills | Status: AC

## 2022-12-21 MED ORDER — DULOXETINE HCL 60 MG PO CPEP: 60.0000 mg | ORAL_CAPSULE | Freq: Every morning | ORAL | 3 refills | Status: DC
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
## 2022-12-22 ENCOUNTER — Telehealth: Payer: Self-pay

## 2022-12-22 NOTE — Telephone Encounter (Signed)
Called re: PREP program referral; is interested and would like to attend at [facility]; will have Bev the RN [facility] contact her with [facility] schedule.

## 2022-12-27 ENCOUNTER — Telehealth: Payer: Self-pay | Admitting: Family Medicine

## 2022-12-27 ENCOUNTER — Ambulatory Visit: Payer: Medicare HMO

## 2022-12-27 VITALS — Ht 64.0 in | Wt 166.0 lb

## 2022-12-27 DIAGNOSIS — Z0000 Encounter for general adult medical examination without abnormal findings: Secondary | ICD-10-CM

## 2022-12-27 NOTE — Patient Instructions (Signed)
Ms. Thetford , Thank you for taking time to come for your Medicare Wellness Visit. I appreciate your ongoing commitment to your health goals. Please review the following plan we discussed and let me know if I can assist you in the future.   Referrals/Orders/Follow-Ups/Clinician Recommendations: Aim for 30 minutes of exercise or brisk walking, 6-8 glasses of water, and 5 servings of fruits and vegetables each day.  This is a list of the screening recommended for you and due dates:  Health Maintenance  Topic Date Due   DTaP/Tdap/Td vaccine (2 - Tdap) 10/27/2019   Colon Cancer Screening  01/09/2023   COVID-19 Vaccine (7 - 2023-24 season) 01/16/2023   Medicare Annual Wellness Visit  12/27/2023   Pneumonia Vaccine  Completed   Flu Shot  Completed   DEXA scan (bone density measurement)  Completed   Zoster (Shingles) Vaccine  Completed   HPV Vaccine  Aged Out    Advanced directives: (ACP Link)Information on Advanced Care Planning can be found at [facility] Advance Health Care Directives Advance Health Care Directives ([email or web address]   Next Medicare Annual Wellness Visit scheduled for next year: Yes
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
## 2022-12-27 NOTE — Telephone Encounter (Signed)
Patient called she received a letter saying needs to have a bone density done

## 2022-12-28 ENCOUNTER — Telehealth: Payer: Self-pay | Admitting: *Deleted

## 2022-12-28 NOTE — Telephone Encounter (Signed)
Patient informed. 

## 2022-12-28 NOTE — Telephone Encounter (Signed)
Contacted regarding PREP Class beginning on 01/09/2023 every T/TH from [id number]. Intake assessment scheduled for 01/04/2023 at 1130 at the [facility].

## 2023-01-02 DIAGNOSIS — M797 Fibromyalgia: Secondary | ICD-10-CM | POA: Diagnosis not present

## 2023-01-02 DIAGNOSIS — M47816 Spondylosis without myelopathy or radiculopathy, lumbar region: Secondary | ICD-10-CM | POA: Diagnosis not present

## 2023-01-11 ENCOUNTER — Telehealth: Payer: Self-pay | Admitting: *Deleted

## 2023-01-11 NOTE — Telephone Encounter (Signed)
Contacted in regards to missing intake assessment and first PREP Class on 01/09/2023. Stated she was not feeling well. She also missed class today. If she is interested in taking PREP at another point in time she may contact us.

## 2023-01-12 DIAGNOSIS — M797 Fibromyalgia: Secondary | ICD-10-CM | POA: Diagnosis not present

## 2023-01-12 DIAGNOSIS — M47816 Spondylosis without myelopathy or radiculopathy, lumbar region: Secondary | ICD-10-CM | POA: Diagnosis not present

## 2023-01-16 ENCOUNTER — Ambulatory Visit (HOSPITAL_COMMUNITY)
Admission: RE | Admit: 2023-01-16 | Discharge: 2023-01-16 | Disposition: A | Discharge status: Home / Self Care | Payer: Medicare HMO | Source: Ambulatory Visit | Attending: Family Medicine | Admitting: Family Medicine

## 2023-01-16 DIAGNOSIS — M8589 Other specified disorders of bone density and structure, multiple sites: Secondary | ICD-10-CM | POA: Insufficient documentation

## 2023-01-16 DIAGNOSIS — Z780 Asymptomatic menopausal state: Secondary | ICD-10-CM | POA: Insufficient documentation

## 2023-01-16 DIAGNOSIS — M85852 Other specified disorders of bone density and structure, left thigh: Secondary | ICD-10-CM | POA: Diagnosis not present

## 2023-01-17 ENCOUNTER — Other Ambulatory Visit: Payer: Self-pay | Admitting: Physical Medicine & Rehabilitation

## 2023-02-01 DIAGNOSIS — M797 Fibromyalgia: Secondary | ICD-10-CM | POA: Diagnosis not present

## 2023-02-01 DIAGNOSIS — M47816 Spondylosis without myelopathy or radiculopathy, lumbar region: Secondary | ICD-10-CM | POA: Diagnosis not present

## 2023-02-11 DIAGNOSIS — M797 Fibromyalgia: Secondary | ICD-10-CM | POA: Diagnosis not present

## 2023-02-11 DIAGNOSIS — M47816 Spondylosis without myelopathy or radiculopathy, lumbar region: Secondary | ICD-10-CM | POA: Diagnosis not present

## 2023-02-19 ENCOUNTER — Other Ambulatory Visit: Payer: Self-pay | Admitting: Physical Medicine & Rehabilitation

## 2023-02-22 ENCOUNTER — Other Ambulatory Visit: Payer: Self-pay | Admitting: Orthopedic Surgery

## 2023-02-26 ENCOUNTER — Other Ambulatory Visit: Payer: Self-pay | Admitting: Orthopedic Surgery

## 2023-03-04 DIAGNOSIS — M797 Fibromyalgia: Secondary | ICD-10-CM | POA: Diagnosis not present

## 2023-03-04 DIAGNOSIS — M47816 Spondylosis without myelopathy or radiculopathy, lumbar region: Secondary | ICD-10-CM | POA: Diagnosis not present

## 2023-03-14 ENCOUNTER — Encounter: Payer: Self-pay | Admitting: Cardiology

## 2023-03-14 ENCOUNTER — Ambulatory Visit: Payer: Medicare HMO | Attending: Cardiology | Admitting: Cardiology

## 2023-03-14 VITALS — BP 130/64 | HR 92 | Ht 64.0 in | Wt 164.2 lb

## 2023-03-14 DIAGNOSIS — I350 Nonrheumatic aortic (valve) stenosis: Secondary | ICD-10-CM | POA: Diagnosis not present

## 2023-03-14 DIAGNOSIS — E782 Mixed hyperlipidemia: Secondary | ICD-10-CM | POA: Diagnosis not present

## 2023-03-14 DIAGNOSIS — I10 Essential (primary) hypertension: Secondary | ICD-10-CM | POA: Diagnosis not present

## 2023-03-14 DIAGNOSIS — M797 Fibromyalgia: Secondary | ICD-10-CM | POA: Diagnosis not present

## 2023-03-14 DIAGNOSIS — M47816 Spondylosis without myelopathy or radiculopathy, lumbar region: Secondary | ICD-10-CM | POA: Diagnosis not present

## 2023-03-14 NOTE — Patient Instructions (Signed)
Medication Instructions:  Your physician recommends that you continue on your current medications as directed. Please refer to the Current Medication list given to you today.  *If you need a refill on your cardiac medications before your next appointment, please call your pharmacy*   Lab Work: NONE   If you have labs (blood work) drawn today and your tests are completely normal, you will receive your results only by: MyChart Message (if you have MyChart) OR A paper copy in the mail If you have any lab test that is abnormal or we need to change your treatment, we will call you to review the results.   Testing/Procedures: Your physician has requested that you have an echocardiogram. Echocardiography is a painless test that uses sound waves to create images of your heart. It provides your doctor with information about the size and shape of your heart and how well your heart's chambers and valves are working. This procedure takes approximately one hour. There are no restrictions for this procedure. Please do NOT wear cologne, perfume, aftershave, or lotions (deodorant is allowed). Please arrive 15 minutes prior to your appointment time.  Please note: We ask at that you not bring children with you during ultrasound (echo/ vascular) testing. Due to room size and safety concerns, children are not allowed in the ultrasound rooms during exams. Our front office staff cannot provide observation of children in our lobby area while testing is being conducted. An adult accompanying a patient to their appointment will only be allowed in the ultrasound room at the discretion of the ultrasound technician under special circumstances. We apologize for any inconvenience.    Follow-Up: At [facility], you and your health needs are our priority.  As part of our continuing mission to provide you with exceptional heart care, we have created designated Provider Care Teams.  These Care Teams include your  primary Cardiologist (physician) and Advanced Practice Providers (APPs -  Physician Assistants and Nurse Practitioners) who all work together to provide you with the care you need, when you need it.  We recommend signing up for the patient portal called "MyChart".  Sign up information is provided on this After Visit Summary.  MyChart is used to connect with patients for Virtual Visits (Telemedicine).  Patients are able to view lab/test results, encounter notes, upcoming appointments, etc.  Non-urgent messages can be sent to your provider as well.   To learn more about what you can do with MyChart, go to [email or web address].    Your next appointment:   6 month(s)  Provider:   You may see Dina Rich, MD or the following Advanced Practice Provider on your designated Care Team:   Sharlene Dory, NP    Other Instructions Thank you for choosing [facility]!
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
## 2023-03-16 ENCOUNTER — Ambulatory Visit: Payer: Medicare HMO | Admitting: Family Medicine

## 2023-03-20 ENCOUNTER — Ambulatory Visit (INDEPENDENT_AMBULATORY_CARE_PROVIDER_SITE_OTHER): Payer: Medicare HMO | Admitting: Family Medicine

## 2023-03-20 ENCOUNTER — Encounter: Payer: Self-pay | Admitting: Family Medicine

## 2023-03-20 VITALS — BP 136/71 | HR 82 | Ht 64.0 in | Wt 166.1 lb

## 2023-03-20 DIAGNOSIS — E782 Mixed hyperlipidemia: Secondary | ICD-10-CM

## 2023-03-20 DIAGNOSIS — J301 Allergic rhinitis due to pollen: Secondary | ICD-10-CM | POA: Diagnosis not present

## 2023-03-20 DIAGNOSIS — F324 Major depressive disorder, single episode, in partial remission: Secondary | ICD-10-CM

## 2023-03-20 DIAGNOSIS — E039 Hypothyroidism, unspecified: Secondary | ICD-10-CM

## 2023-03-20 DIAGNOSIS — M797 Fibromyalgia: Secondary | ICD-10-CM | POA: Diagnosis not present

## 2023-03-20 DIAGNOSIS — R7303 Prediabetes: Secondary | ICD-10-CM

## 2023-03-20 DIAGNOSIS — E663 Overweight: Secondary | ICD-10-CM | POA: Diagnosis not present

## 2023-03-20 DIAGNOSIS — I10 Essential (primary) hypertension: Secondary | ICD-10-CM | POA: Diagnosis not present

## 2023-03-20 DIAGNOSIS — M25511 Pain in right shoulder: Secondary | ICD-10-CM

## 2023-03-20 DIAGNOSIS — Z1231 Encounter for screening mammogram for malignant neoplasm of breast: Secondary | ICD-10-CM | POA: Diagnosis not present

## 2023-03-20 DIAGNOSIS — G8929 Other chronic pain: Secondary | ICD-10-CM

## 2023-03-20 NOTE — Patient Instructions (Addendum)
F/U in 4 months, call if you need me sooner  Fasting lipid, cmp and EGFR and hBA1C this week please. If TG are still high you will need another medication for this, we will call and let you kniw  I recommend ortho eval for the painful left shoulder .   Please schedule mammogram at checkout  After you see your Pain Dr you will decide if you want to go that route and get in touch for a referral to Ortho of your choice  Need covid vaccine, RSV and TdAP , all are at your pharmacy  I suspect ins is not covering because you may have been out of network, best for you to call your ins company to get the answer  Careful not to fall  Thanks for choosing [facility], we consider it a privelige to serve you.

## 2023-03-22 ENCOUNTER — Ambulatory Visit: Payer: Medicare HMO | Attending: Cardiology

## 2023-03-22 DIAGNOSIS — E782 Mixed hyperlipidemia: Secondary | ICD-10-CM | POA: Diagnosis not present

## 2023-03-22 DIAGNOSIS — I10 Essential (primary) hypertension: Secondary | ICD-10-CM | POA: Diagnosis not present

## 2023-03-22 DIAGNOSIS — R7303 Prediabetes: Secondary | ICD-10-CM | POA: Diagnosis not present

## 2023-03-23 ENCOUNTER — Encounter: Payer: Medicare HMO | Admitting: Physical Medicine & Rehabilitation

## 2023-03-23 LAB — LIPID PANEL
Chol/HDL Ratio: 3 {ratio} (ref 0.0–4.4)
Cholesterol, Total: 125 mg/dL (ref 100–199)
HDL: 42 mg/dL (ref >39)
LDL Chol Calc (NIH): 59 mg/dL (ref 0–99)
Triglycerides: 137 mg/dL (ref 0–149)
VLDL Cholesterol Cal: 24 mg/dL (ref 5–40)

## 2023-03-23 LAB — CMP14+EGFR
ALT: 18 [IU]/L (ref 0–32)
AST: 26 [IU]/L (ref 0–40)
Albumin: 4.4 g/dL (ref 3.7–4.7)
Alkaline Phosphatase: 58 [IU]/L (ref 44–121)
BUN/Creatinine Ratio: 23 (ref 12–28)
BUN: 22 mg/dL (ref 8–27)
Bilirubin Total: 0.3 mg/dL (ref 0.0–1.2)
CO2: 24 mmol/L (ref 20–29)
Calcium: 9.8 mg/dL (ref 8.7–10.3)
Chloride: 104 mmol/L (ref 96–106)
Creatinine, Ser: 0.96 mg/dL (ref 0.57–1.00)
Globulin, Total: 2.7 g/dL (ref 1.5–4.5)
Glucose: 98 mg/dL (ref 70–99)
Potassium: 4.4 mmol/L (ref 3.5–5.2)
Sodium: 143 mmol/L (ref 134–144)
Total Protein: 7.1 g/dL (ref 6.0–8.5)
eGFR: 58 mL/min/{1.73_m2} — ABNORMAL LOW (ref >59)

## 2023-03-23 LAB — HEMOGLOBIN A1C
Est. average glucose Bld gHb Est-mCnc: 137 mg/dL
Hgb A1c MFr Bld: 6.4 % — ABNORMAL HIGH (ref 4.8–5.6)

## 2023-03-27 NOTE — Progress Notes (Signed)
Andrea Santiago     MRN: [id number]      DOB: 05-31-1940  Chief Complaint  Patient presents with   Follow-up    Follow up, Shoulder pain    HPI Andrea Santiago is here for follow up and re-evaluation of chronic medical conditions, medication management and review of any available recent lab and radiology data.  Preventive health is updated, specifically  Cancer screening and Immunization.   Questions or concerns regarding consultations or procedures which the PT has had in the interim are  addressed.Was evaluated at [facility] 1 year ago and is billed and she is concerned as charged as out of network  C/o increased and uncontrolled pain of left shoulder with reduced mobility, no inciting trauma, wants to address with pain med before Ortho eval   ROS Denies recent fever or chills. Denies sinus pressure, nasal congestion, ear pain or sore throat. Denies chest congestion, productive cough or wheezing. Denies chest pains, palpitations and leg swelling Denies abdominal pain, nausea, vomiting,diarrhea or constipation.   Denies dysuria, frequency, hesitancy . Denies headaches, seizures, Denies uncontrolled depression, anxiety or insomnia. Denies skin break down or rash.   PE  BP 136/71 (BP Location: Right Arm, Patient Position: Sitting, Cuff Size: Large)   Pulse 82   Ht 5\' 4"  (1.626 m)   Wt 166 lb 1.9 oz (75.4 kg)   SpO2 95%   BMI 28.51 kg/m   Patient alert and oriented and in no cardiopulmonary distress.  HEENT: No facial asymmetry, EOMI,     Neck decreased ROM .  Chest: Clear to auscultation bilaterally.  CVS: S1, S2 no murmurs, no S3.Regular rate.  ABD: Soft non tender.   Ext: No edema  MS: decreased ROM spine, shoulders, hips and knees.  Skin: Intact, no ulcerations or rash noted.  Psych: Good eye contact, normal affect. Memory intact not anxious or depressed appearing.  CNS: CN 2-12 intact, power,  normal throughout.no focal deficits noted.   Assessment &  Plan  Prediabetes Patient educated about the importance of limiting  Carbohydrate intake , the need to commit to daily physical activity for a minimum of 30 minutes , and to commit weight loss. The fact that changes in all these areas will reduce or eliminate all together the development of diabetes is stressed.      Latest Ref Rng & Units 03/22/2023   11:24 AM 12/08/2022   10:36 AM 08/03/2022   11:13 AM 03/31/2022    1:13 PM 01/04/2022   11:47 AM  Diabetic Labs  HbA1c 4.8 - 5.6 % 6.4  6.1   6.4  6.4   Chol 100 - 199 mg/dL 829  562  130  865  784   HDL >39 mg/dL 42  36  43  40  39   Calc LDL 0 - 99 mg/dL 59  89  87  696  295   Triglycerides 0 - 149 mg/dL 284  132  440  102  725   Creatinine 0.57 - 1.00 mg/dL 3.66  4.40  3.47  4.25  0.87       03/20/2023    1:57 PM 03/14/2023    2:24 PM 12/27/2022    2:14 PM 12/21/2022    2:20 PM 12/14/2022    1:34 PM 11/07/2022    2:17 PM 11/01/2022    2:26 PM  BP/Weight  Systolic BP 136 130 -- 124 128 146   Diastolic BP 71 64 -- 81 72 76   Wt. (  Lbs) 166.12 164.2 166 166 168 164 160.94  BMI 28.51 kg/m2 28.18 kg/m2 28.49 kg/m2 28.49 kg/m2 28.84 kg/m2 28.15 kg/m2 27.62 kg/m2       No data to display          Deteriorated needs to lower carb intake  Primary hypertension Controlled, no change in medication DASH diet and commitment to daily physical activity for a minimum of 30 minutes discussed and encouraged, as a part of hypertension management. The importance of attaining a healthy weight is also discussed.     03/20/2023    1:57 PM 03/14/2023    2:24 PM 12/27/2022    2:14 PM 12/21/2022    2:20 PM 12/14/2022    1:34 PM 11/07/2022    2:17 PM 11/01/2022    2:26 PM  BP/Weight  Systolic BP 136 130 -- 124 128 146   Diastolic BP 71 64 -- 81 72 76   Wt. (Lbs) 166.12 164.2 166 166 168 164 160.94  BMI 28.51 kg/m2 28.18 kg/m2 28.49 kg/m2 28.49 kg/m2 28.84 kg/m2 28.15 kg/m2 27.62 kg/m2       Mixed hyperlipidemia Hyperlipidemia:Low fat diet  discussed and encouraged.   Lipid Panel  Lab Results  Component Value Date   CHOL 125 03/22/2023   HDL 42 03/22/2023   LDLCALC 59 03/22/2023   LDLDIRECT 141 (H) 10/31/2007   TRIG 137 03/22/2023   CHOLHDL 3.0 03/22/2023    Updated lab shows good control on current meds   Overweight  Patient re-educated about  the importance of commitment to a  minimum of 150 minutes of exercise per week as able.  The importance of healthy food choices with portion control discussed, as well as eating regularly and within a 12 hour window most days. The need to choose "clean , green" food 50 to 75% of the time is discussed, as well as to make water the primary drink and set a goal of 64 ounces water daily.       03/20/2023    1:57 PM 03/14/2023    2:24 PM 12/27/2022    2:14 PM  Weight /BMI  Weight 166 lb 1.9 oz 164 lb 3.2 oz 166 lb  Height 5\' 4"  (1.626 m) 5\' 4"  (1.626 m) 5\' 4"  (1.626 m)  BMI 28.51 kg/m2 28.18 kg/m2 28.49 kg/m2    stable  Allergic rhinitis Controlled, no change in medication   Depression, major, single episode, in partial remission (HCC) Controlled, no change in medication   Hypothyroid Controlled will re evaluate in 4 months  Fibromyalgia Treated with cymbalta, recently referred to pain management by Rheumatology  Shoulder pain, right Increased in past 1 month, recommend Ortho eval, waiting on pain clinic appt before deciding on thois

## 2023-03-27 NOTE — Assessment & Plan Note (Signed)
Controlled, no change in medication DASH diet and commitment to daily physical activity for a minimum of 30 minutes discussed and encouraged, as a part of hypertension management. The importance of attaining a healthy weight is also discussed.     03/20/2023    1:57 PM 03/14/2023    2:24 PM 12/27/2022    2:14 PM 12/21/2022    2:20 PM 12/14/2022    1:34 PM 11/07/2022    2:17 PM 11/01/2022    2:26 PM  BP/Weight  Systolic BP 136 130 -- 124 128 146   Diastolic BP 71 64 -- 81 72 76   Wt. (Lbs) 166.12 164.2 166 166 168 164 160.94  BMI 28.51 kg/m2 28.18 kg/m2 28.49 kg/m2 28.49 kg/m2 28.84 kg/m2 28.15 kg/m2 27.62 kg/m2

## 2023-03-27 NOTE — Assessment & Plan Note (Signed)
Increased in past 1 month, recommend Ortho eval, waiting on pain clinic appt before deciding on thois

## 2023-03-27 NOTE — Assessment & Plan Note (Signed)
Patient educated about the importance of limiting  Carbohydrate intake , the need to commit to daily physical activity for a minimum of 30 minutes , and to commit weight loss. The fact that changes in all these areas will reduce or eliminate all together the development of diabetes is stressed.      Latest Ref Rng & Units 03/22/2023   11:24 AM 12/08/2022   10:36 AM 08/03/2022   11:13 AM 03/31/2022    1:13 PM 01/04/2022   11:47 AM  Diabetic Labs  HbA1c 4.8 - 5.6 % 6.4  6.1   6.4  6.4   Chol 100 - 199 mg/dL 161  096  045  409  811   HDL >39 mg/dL 42  36  43  40  39   Calc LDL 0 - 99 mg/dL 59  89  87  914  782   Triglycerides 0 - 149 mg/dL 956  213  086  578  469   Creatinine 0.57 - 1.00 mg/dL 6.29  5.28  4.13  2.44  0.87       03/20/2023    1:57 PM 03/14/2023    2:24 PM 12/27/2022    2:14 PM 12/21/2022    2:20 PM 12/14/2022    1:34 PM 11/07/2022    2:17 PM 11/01/2022    2:26 PM  BP/Weight  Systolic BP 136 130 -- 124 128 146   Diastolic BP 71 64 -- 81 72 76   Wt. (Lbs) 166.12 164.2 166 166 168 164 160.94  BMI 28.51 kg/m2 28.18 kg/m2 28.49 kg/m2 28.49 kg/m2 28.84 kg/m2 28.15 kg/m2 27.62 kg/m2       No data to display          Deteriorated needs to lower carb intake

## 2023-03-27 NOTE — Assessment & Plan Note (Signed)
 Controlled, no change in medication

## 2023-03-27 NOTE — Assessment & Plan Note (Signed)
Controlled will re evaluate in 4 months

## 2023-03-27 NOTE — Assessment & Plan Note (Signed)
  Patient re-educated about  the importance of commitment to a  minimum of 150 minutes of exercise per week as able.  The importance of healthy food choices with portion control discussed, as well as eating regularly and within a 12 hour window most days. The need to choose "clean , green" food 50 to 75% of the time is discussed, as well as to make water the primary drink and set a goal of 64 ounces water daily.       03/20/2023    1:57 PM 03/14/2023    2:24 PM 12/27/2022    2:14 PM  Weight /BMI  Weight 166 lb 1.9 oz 164 lb 3.2 oz 166 lb  Height 5\' 4"  (1.626 m) 5\' 4"  (1.626 m) 5\' 4"  (1.626 m)  BMI 28.51 kg/m2 28.18 kg/m2 28.49 kg/m2    stable

## 2023-03-27 NOTE — Assessment & Plan Note (Signed)
 Hyperlipidemia:Low fat diet discussed and encouraged.   Lipid Panel  Lab Results  Component Value Date   CHOL 125 03/22/2023   HDL 42 03/22/2023   LDLCALC 59 03/22/2023   LDLDIRECT 141 (H) 10/31/2007   TRIG 137 03/22/2023   CHOLHDL 3.0 03/22/2023    Updated lab shows good control on current meds

## 2023-03-27 NOTE — Assessment & Plan Note (Signed)
Treated with cymbalta, recently referred to pain management by Rheumatology

## 2023-03-29 ENCOUNTER — Ambulatory Visit (HOSPITAL_COMMUNITY): Payer: Medicare HMO

## 2023-04-03 ENCOUNTER — Ambulatory Visit: Payer: Medicare HMO | Attending: Cardiology

## 2023-04-03 DIAGNOSIS — I350 Nonrheumatic aortic (valve) stenosis: Secondary | ICD-10-CM | POA: Diagnosis not present

## 2023-04-03 LAB — ECHOCARDIOGRAM COMPLETE
AR max vel: 0.81 cm2
AV Area VTI: 0.92 cm2
AV Area mean vel: 0.81 cm2
AV Mean grad: 36.4 mm[Hg]
AV Peak grad: 63 mm[Hg]
AV Vena cont: 0.8 cm
Ao pk vel: 3.97 m/s
Area-P 1/2: 3.03 cm2
Calc EF: 64.9 %
MV VTI: 2.59 cm2
P 1/2 time: 369 ms
S' Lateral: 2.5 cm
Single Plane A2C EF: 70.8 %
Single Plane A4C EF: 59.4 %

## 2023-04-04 DIAGNOSIS — M47816 Spondylosis without myelopathy or radiculopathy, lumbar region: Secondary | ICD-10-CM | POA: Diagnosis not present

## 2023-04-04 DIAGNOSIS — M797 Fibromyalgia: Secondary | ICD-10-CM | POA: Diagnosis not present

## 2023-04-05 ENCOUNTER — Ambulatory Visit (HOSPITAL_COMMUNITY): Payer: Medicare HMO

## 2023-04-09 ENCOUNTER — Encounter: Payer: Medicare HMO | Attending: Physical Medicine & Rehabilitation | Admitting: Physical Medicine & Rehabilitation

## 2023-04-09 ENCOUNTER — Encounter: Payer: Self-pay | Admitting: Physical Medicine & Rehabilitation

## 2023-04-09 VITALS — BP 145/65 | HR 76 | Ht 64.0 in | Wt 165.0 lb

## 2023-04-09 DIAGNOSIS — M797 Fibromyalgia: Secondary | ICD-10-CM | POA: Insufficient documentation

## 2023-04-09 DIAGNOSIS — F32A Depression, unspecified: Secondary | ICD-10-CM | POA: Diagnosis not present

## 2023-04-09 DIAGNOSIS — M47816 Spondylosis without myelopathy or radiculopathy, lumbar region: Secondary | ICD-10-CM | POA: Insufficient documentation
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
## 2023-04-12 ENCOUNTER — Ambulatory Visit (HOSPITAL_COMMUNITY): Payer: Medicare HMO

## 2023-04-14 DIAGNOSIS — M47816 Spondylosis without myelopathy or radiculopathy, lumbar region: Secondary | ICD-10-CM | POA: Diagnosis not present

## 2023-04-14 DIAGNOSIS — M797 Fibromyalgia: Secondary | ICD-10-CM | POA: Diagnosis not present

## 2023-04-19 ENCOUNTER — Ambulatory Visit (HOSPITAL_COMMUNITY): Payer: Medicare HMO

## 2023-04-20 ENCOUNTER — Ambulatory Visit: Payer: Medicare HMO | Admitting: Family Medicine

## 2023-04-24 ENCOUNTER — Other Ambulatory Visit: Payer: Self-pay | Admitting: Family Medicine

## 2023-04-25 ENCOUNTER — Other Ambulatory Visit: Payer: Self-pay | Admitting: Physical Medicine & Rehabilitation

## 2023-04-25 ENCOUNTER — Other Ambulatory Visit: Payer: Self-pay | Admitting: Family Medicine

## 2023-04-28 MED ORDER — DULOXETINE HCL 30 MG PO CPEP: 30.0000 mg | ORAL_CAPSULE | Freq: Every day | ORAL | 0 refills | Status: DC

## 2023-05-01 ENCOUNTER — Encounter: Payer: Self-pay | Admitting: *Deleted

## 2023-05-01 ENCOUNTER — Telehealth: Payer: Self-pay | Admitting: Orthopedic Surgery

## 2023-05-01 NOTE — Telephone Encounter (Signed)
 Dr. Dallas Schimke pt - returned the pt's call from 04/30/23, she had lvm regarding a referral.  I see we have one, but has been closed.  I've lvm for the pt to call me back.

## 2023-05-02 ENCOUNTER — Ambulatory Visit (HOSPITAL_COMMUNITY): Payer: Medicare HMO

## 2023-05-02 ENCOUNTER — Telehealth: Payer: Self-pay | Admitting: Family Medicine

## 2023-05-02 DIAGNOSIS — G8929 Other chronic pain: Secondary | ICD-10-CM

## 2023-05-02 NOTE — Telephone Encounter (Signed)
 Progressive pain and stiffness left shoulder , requests Ortho appt , will refer to Dr Dallas Schimke

## 2023-05-04 DIAGNOSIS — M47816 Spondylosis without myelopathy or radiculopathy, lumbar region: Secondary | ICD-10-CM | POA: Diagnosis not present

## 2023-05-04 DIAGNOSIS — M797 Fibromyalgia: Secondary | ICD-10-CM | POA: Diagnosis not present
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
## 2023-05-11 ENCOUNTER — Encounter: Payer: Self-pay | Admitting: Orthopedic Surgery

## 2023-05-11 ENCOUNTER — Ambulatory Visit (INDEPENDENT_AMBULATORY_CARE_PROVIDER_SITE_OTHER): Admitting: Orthopedic Surgery

## 2023-05-11 VITALS — BP 140/72 | HR 67 | Ht 64.0 in | Wt 164.0 lb

## 2023-05-11 DIAGNOSIS — M19012 Primary osteoarthritis, left shoulder: Secondary | ICD-10-CM | POA: Diagnosis not present

## 2023-05-11 NOTE — Progress Notes (Signed)
 Orthopaedic Clinic Return  Assessment: Andrea Santiago is a 83 y.o. female with the following: Left glenohumeral joint arthritis  Plan: Andrea Santiago has advanced degenerative changes of the left glenohumeral joint.  This includes inferior osteophytes.  Complete loss of joint space.  I believe this is contributing to her pain.  She has a restricted motion due to the pain, as well as the arthritis.  We discussed multiple treatment options including medications, topical treatments, focused activities injections and shoulder replacement.  She is not interested in further treatment at this time.  If her pain worsens, she will return to clinic.  Follow-up as needed.  Follow-up: Return if symptoms worsen or fail to improve.   Subjective:  Chief Complaint  Patient presents with   Shoulder Pain    L for "a while" but getting worse.     History of Present Illness: Andrea Santiago is a 83 y.o. female who returns to clinic for evaluation of left shoulder pain.  I have seen her previously, and treated her for a right distal radius fracture.  She reports that she has had pain in the left shoulder for several months.  Nothing specific has caused the pain.  She remains active.  She does have some slightly restricted range of motion.  She denies numbness and tingling.  Review of Systems: No fevers or chills No numbness or tingling No chest pain No shortness of breath No bowel or bladder dysfunction No GI distress No headaches   Objective: BP (!) 140/72   Pulse 67   Ht 5\' 4"  (1.626 m)   Wt 164 lb (74.4 kg)   BMI 28.15 kg/m   Physical Exam:  Alert and oriented.  No acute distress.    160 degrees of forward flexion.  Internal rotation to lumbar spine.  Slightly restricted external rotation at her side compared to the contralateral side.  Sensation is intact throughout the left upper extremity.  Fingers warm well-perfused.  IMAGING: I personally ordered and reviewed the following  images:  X-rays of the left shoulder were previously obtained.  Complete loss of glenohumeral joint space, with large inferior osteophyte off the humeral head.   Oliver Barre, MD 05/11/2023 10:58 AM

## 2023-05-14 ENCOUNTER — Ambulatory Visit (HOSPITAL_COMMUNITY)

## 2023-05-14 DIAGNOSIS — M797 Fibromyalgia: Secondary | ICD-10-CM | POA: Diagnosis not present

## 2023-05-14 DIAGNOSIS — M47816 Spondylosis without myelopathy or radiculopathy, lumbar region: Secondary | ICD-10-CM | POA: Diagnosis not present

## 2023-05-21 ENCOUNTER — Other Ambulatory Visit (HOSPITAL_COMMUNITY): Payer: Self-pay | Admitting: Family Medicine

## 2023-05-21 ENCOUNTER — Ambulatory Visit (HOSPITAL_COMMUNITY)

## 2023-05-21 DIAGNOSIS — Z1231 Encounter for screening mammogram for malignant neoplasm of breast: Secondary | ICD-10-CM

## 2023-05-23 ENCOUNTER — Ambulatory Visit: Payer: Medicare HMO | Admitting: Family Medicine

## 2023-05-23 ENCOUNTER — Ambulatory Visit: Payer: Medicare HMO | Admitting: Rheumatology

## 2023-05-23 DIAGNOSIS — M62838 Other muscle spasm: Secondary | ICD-10-CM

## 2023-05-23 DIAGNOSIS — Z8669 Personal history of other diseases of the nervous system and sense organs: Secondary | ICD-10-CM

## 2023-05-23 DIAGNOSIS — M797 Fibromyalgia: Secondary | ICD-10-CM

## 2023-05-23 DIAGNOSIS — R5383 Other fatigue: Secondary | ICD-10-CM

## 2023-05-23 DIAGNOSIS — E559 Vitamin D deficiency, unspecified: Secondary | ICD-10-CM

## 2023-05-23 DIAGNOSIS — M5030 Other cervical disc degeneration, unspecified cervical region: Secondary | ICD-10-CM

## 2023-05-23 DIAGNOSIS — M170 Bilateral primary osteoarthritis of knee: Secondary | ICD-10-CM

## 2023-05-23 DIAGNOSIS — Z8639 Personal history of other endocrine, nutritional and metabolic disease: Secondary | ICD-10-CM

## 2023-05-23 DIAGNOSIS — M19041 Primary osteoarthritis, right hand: Secondary | ICD-10-CM

## 2023-05-23 DIAGNOSIS — M7061 Trochanteric bursitis, right hip: Secondary | ICD-10-CM

## 2023-05-23 DIAGNOSIS — M8589 Other specified disorders of bone density and structure, multiple sites: Secondary | ICD-10-CM

## 2023-05-23 DIAGNOSIS — M19071 Primary osteoarthritis, right ankle and foot: Secondary | ICD-10-CM

## 2023-05-23 DIAGNOSIS — M47816 Spondylosis without myelopathy or radiculopathy, lumbar region: Secondary | ICD-10-CM

## 2023-05-23 DIAGNOSIS — Z8679 Personal history of other diseases of the circulatory system: Secondary | ICD-10-CM

## 2023-05-24 ENCOUNTER — Ambulatory Visit: Payer: Medicare HMO | Admitting: Family Medicine

## 2023-06-04 DIAGNOSIS — M797 Fibromyalgia: Secondary | ICD-10-CM | POA: Diagnosis not present

## 2023-06-04 DIAGNOSIS — M47816 Spondylosis without myelopathy or radiculopathy, lumbar region: Secondary | ICD-10-CM | POA: Diagnosis not present

## 2023-06-07 ENCOUNTER — Encounter: Payer: Medicare HMO | Attending: Physical Medicine & Rehabilitation | Admitting: Physical Medicine & Rehabilitation

## 2023-06-07 DIAGNOSIS — M47816 Spondylosis without myelopathy or radiculopathy, lumbar region: Secondary | ICD-10-CM | POA: Insufficient documentation

## 2023-06-07 DIAGNOSIS — M797 Fibromyalgia: Secondary | ICD-10-CM | POA: Insufficient documentation

## 2023-06-07 DIAGNOSIS — F32A Depression, unspecified: Secondary | ICD-10-CM | POA: Insufficient documentation

## 2023-06-14 ENCOUNTER — Other Ambulatory Visit: Payer: Self-pay | Admitting: Family Medicine

## 2023-06-14 ENCOUNTER — Other Ambulatory Visit: Payer: Self-pay | Admitting: Physical Medicine & Rehabilitation

## 2023-06-14 DIAGNOSIS — M47816 Spondylosis without myelopathy or radiculopathy, lumbar region: Secondary | ICD-10-CM | POA: Diagnosis not present

## 2023-06-14 DIAGNOSIS — M797 Fibromyalgia: Secondary | ICD-10-CM | POA: Diagnosis not present

## 2023-06-15 MED ORDER — DULOXETINE HCL 30 MG PO CPEP: 30.0000 mg | ORAL_CAPSULE | Freq: Every day | ORAL | 0 refills | Status: DC

## 2023-06-20 ENCOUNTER — Encounter (HOSPITAL_COMMUNITY): Payer: Self-pay

## 2023-06-20 ENCOUNTER — Other Ambulatory Visit (HOSPITAL_COMMUNITY): Payer: Self-pay | Admitting: Family Medicine

## 2023-06-20 ENCOUNTER — Ambulatory Visit (HOSPITAL_COMMUNITY)
Admission: RE | Admit: 2023-06-20 | Discharge: 2023-06-20 | Disposition: A | Discharge status: Home / Self Care | Source: Ambulatory Visit | Attending: Family Medicine | Admitting: Family Medicine

## 2023-06-20 DIAGNOSIS — N63 Unspecified lump in unspecified breast: Secondary | ICD-10-CM

## 2023-06-20 DIAGNOSIS — Z1231 Encounter for screening mammogram for malignant neoplasm of breast: Secondary | ICD-10-CM | POA: Insufficient documentation

## 2023-06-28 ENCOUNTER — Other Ambulatory Visit: Payer: Self-pay | Admitting: Family Medicine

## 2023-06-28 MED ORDER — BUDESONIDE-FORMOTEROL FUMARATE 160-4.5 MCG/ACT IN AERO: 2.0000 | INHALATION_SPRAY | Freq: Two times a day (BID) | RESPIRATORY_TRACT | 12 refills | Status: DC

## 2023-06-28 MED ORDER — LEVOTHYROXINE SODIUM 88 MCG PO TABS: 88.0000 ug | ORAL_TABLET | Freq: Every day | ORAL | 3 refills | Status: AC

## 2023-06-28 MED ORDER — HYDROCHLOROTHIAZIDE 25 MG PO TABS: 25.0000 mg | ORAL_TABLET | Freq: Every day | ORAL | 3 refills | Status: DC

## 2023-06-28 NOTE — Telephone Encounter (Signed)
 Note also requested Crestor , one refill remains.

## 2023-06-28 NOTE — Telephone Encounter (Signed)
 Copied from CRM [id number]. Topic: Clinical - Medication Refill >> Jun 28, 2023  1:56 PM Carlatta H wrote: Medication: levothyroxine  (SYNTHROID ) 88 MCG tablet [[id number]] hydrochlorothiazide  (HYDRODIURIL ) 25 MG tablet [[id number]] SYMBICORT  160-4.5 MCG/ACT inhaler [[id number]] rosuvastatin  (CRESTOR ) 10 MG tablet [[id number]]   Has the patient contacted their pharmacy? No (Agent: If no, request that the patient contact the pharmacy for the refill. If patient does not wish to contact the pharmacy document the reason why and proceed with request.) (Agent: If yes, when and what did the pharmacy advise?)  This is the patient's preferred pharmacy:  [id number] - [place], [place] - [address] [place] [address] Phone: [phone] Fax: [phone]  Is this the correct pharmacy for this prescription? Yes If no, delete pharmacy and type the correct one.   Has the prescription been filled recently? No  Is the patient out of the medication? Yes  Has the patient been seen for an appointment in the last year OR does the patient have an upcoming appointment? No  Can we respond through MyChart? No  Agent: Please be advised that Rx refills may take up to 3 business days. We ask that you follow-up with your pharmacy.

## 2023-07-04 DIAGNOSIS — M47816 Spondylosis without myelopathy or radiculopathy, lumbar region: Secondary | ICD-10-CM | POA: Diagnosis not present

## 2023-07-04 DIAGNOSIS — M797 Fibromyalgia: Secondary | ICD-10-CM | POA: Diagnosis not present

## 2023-07-10 ENCOUNTER — Inpatient Hospital Stay (HOSPITAL_COMMUNITY): Admission: RE | Admit: 2023-07-10 | Source: Ambulatory Visit

## 2023-07-10 ENCOUNTER — Ambulatory Visit (HOSPITAL_COMMUNITY)
Admission: RE | Admit: 2023-07-10 | Discharge: 2023-07-10 | Disposition: A | Discharge status: Home / Self Care | Source: Ambulatory Visit | Attending: Family Medicine | Admitting: Family Medicine

## 2023-07-10 ENCOUNTER — Encounter (HOSPITAL_COMMUNITY): Payer: Self-pay

## 2023-07-10 DIAGNOSIS — N63 Unspecified lump in unspecified breast: Secondary | ICD-10-CM

## 2023-07-10 DIAGNOSIS — Z9889 Other specified postprocedural states: Secondary | ICD-10-CM | POA: Insufficient documentation

## 2023-07-10 DIAGNOSIS — R92313 Mammographic fatty tissue density, bilateral breasts: Secondary | ICD-10-CM | POA: Diagnosis not present

## 2023-07-10 DIAGNOSIS — N644 Mastodynia: Secondary | ICD-10-CM | POA: Diagnosis not present

## 2023-07-10 DIAGNOSIS — N6315 Unspecified lump in the right breast, overlapping quadrants: Secondary | ICD-10-CM | POA: Diagnosis present

## 2023-07-14 DIAGNOSIS — M47816 Spondylosis without myelopathy or radiculopathy, lumbar region: Secondary | ICD-10-CM | POA: Diagnosis not present

## 2023-07-14 DIAGNOSIS — M797 Fibromyalgia: Secondary | ICD-10-CM | POA: Diagnosis not present

## 2023-07-19 ENCOUNTER — Ambulatory Visit: Payer: Medicare HMO | Admitting: Family Medicine

## 2023-07-24 ENCOUNTER — Encounter: Payer: Self-pay | Admitting: Family Medicine

## 2023-07-24 ENCOUNTER — Ambulatory Visit (INDEPENDENT_AMBULATORY_CARE_PROVIDER_SITE_OTHER): Admitting: Family Medicine

## 2023-07-24 VITALS — BP 144/70 | HR 71 | Resp 18 | Ht 64.0 in | Wt 159.1 lb

## 2023-07-24 DIAGNOSIS — F324 Major depressive disorder, single episode, in partial remission: Secondary | ICD-10-CM | POA: Diagnosis not present

## 2023-07-24 DIAGNOSIS — M797 Fibromyalgia: Secondary | ICD-10-CM

## 2023-07-24 DIAGNOSIS — M5442 Lumbago with sciatica, left side: Secondary | ICD-10-CM

## 2023-07-24 DIAGNOSIS — I10 Essential (primary) hypertension: Secondary | ICD-10-CM | POA: Diagnosis not present

## 2023-07-24 DIAGNOSIS — G8929 Other chronic pain: Secondary | ICD-10-CM

## 2023-07-24 DIAGNOSIS — F5104 Psychophysiologic insomnia: Secondary | ICD-10-CM

## 2023-07-24 DIAGNOSIS — I350 Nonrheumatic aortic (valve) stenosis: Secondary | ICD-10-CM | POA: Diagnosis not present

## 2023-07-24 DIAGNOSIS — E039 Hypothyroidism, unspecified: Secondary | ICD-10-CM

## 2023-07-24 DIAGNOSIS — R7303 Prediabetes: Secondary | ICD-10-CM | POA: Diagnosis not present

## 2023-07-24 MED ORDER — KETOROLAC TROMETHAMINE 60 MG/2ML IM SOLN
60.0000 mg | Freq: Once | INTRAMUSCULAR | Status: AC
  Administered 2023-07-24: 60 mg via INTRAMUSCULAR

## 2023-07-24 MED ORDER — AMLODIPINE BESYLATE 2.5 MG PO TABS: 2.5000 mg | ORAL_TABLET | Freq: Every day | ORAL | 3 refills | Status: DC

## 2023-07-24 MED ORDER — BELSOMRA 5 MG PO TABS: ORAL_TABLET | ORAL | 3 refills | Status: AC

## 2023-07-24 NOTE — Patient Instructions (Signed)
 F/U in 8 weeks review BP , call if you neeed me sooner  Toradol  60 mg IM in office today for pain  HBA1C, Chem 7 and EGFr and TSH and free t4 today  New for blood pressure is amlodipine  2.5 mg one dAILY CONTINUE THE MEDICATION YOU ARE CURRENTLY TAKING  NURSE PLS ORDER COLOGARD , SHE NEEDS TO SEND OFF IN NEXT 1 WEEK, SHE WILL BE 83 THIS MONTH!   HAPPY BIRTHDAY WHEN IT COMES!!  Thanks for choosing [facility], we consider it a privelige to serve you.

## 2023-07-24 NOTE — Assessment & Plan Note (Signed)
 Uncontrolled flare x 2 weeks, Toradol  60 mg IM in office

## 2023-07-24 NOTE — Progress Notes (Signed)
 Andrea Santiago     MRN: [id number]      DOB: Jan 31, 1941  Chief Complaint  Patient presents with   Medical Management of Chronic Issues    3 month follow up. Pt is asking for a shot for hip and back pain from fibromyalgia     HPI [name] is here for follow up and re-evaluation of chronic medical conditions, medication management and review of any available recent lab and radiology data.  Preventive health is updated, specifically  Cancer screening and Immunization.   Questions or concerns regarding consultations or procedures which the PT has had in the interim are  addressed.Aortic stenosis has worsened and is severe, she is asymptomatic C/o worsening insomnia, difficulty falling and staying asleep Was seen in pain management but states aleve does just as good so not taking any prescription pain meds , requests shot forpain, generalized  as well as low back pain rated between 8 t 10 in past several days   ROS Denies recent fever or chills. Denies sinus pressure, nasal congestion, ear pain or sore throat. Denies chest congestion, productive cough or wheezing. Denies chest pains, palpitations and leg swelling Denies abdominal pain, nausea, vomiting,diarrhea or constipation.   Denies dysuria, frequency, hesitancy or incontinence.  Denies headaches, seizures, nia. Denies skin break down or rash.   PE  BP (!) 144/70   Pulse 71   Resp 18   Ht 5\' 4"  (1.626 m)   Wt 159 lb 1.9 oz (72.2 kg)   SpO2 96%   BMI 27.31 kg/m   Patient alert and oriented and in no cardiopulmonary distress.  HEENT: No facial asymmetry, EOMI,     Neck supple .  Chest: Clear to auscultation bilaterally.  CVS: S1, S2 no murmurs, no S3.Regular rate.  ABD: Soft non tender.   Ext: No edema  MS: decreased  ROM spine, shoulders, hips and knees.  Skin: Intact, no ulcerations or rash noted.  Psych: Good eye contact, normal affect. Memory intact not anxious or depressed appearing.  CNS: CN 2-12 intact,  power,  normal throughout.no focal deficits noted.   Assessment & Plan  Hypertension Uncontrolled add amlodipine  2.5 mg  DASH diet and commitment to daily physical activity for a minimum of 30 minutes discussed and encouraged, as a part of hypertension management. The importance of attaining a healthy weight is also discussed.     07/24/2023    4:12 PM 07/24/2023    3:26 PM 05/11/2023   10:35 AM 04/09/2023    1:27 PM 03/20/2023    1:57 PM 03/14/2023    2:24 PM 12/27/2022    2:14 PM  BP/Weight  Systolic BP 144 145 140 145 136 130 --  Diastolic BP 70 70 72 65 71 64 --  Wt. (Lbs)  159.12 164 165 166.12 164.2 166  BMI  27.31 kg/m2 28.15 kg/m2 28.32 kg/m2 28.51 kg/m2 28.18 kg/m2 28.49 kg/m2     Updated lab needed at/ before next visit.   Essential hypertension Uncontrolled add amlodipine  2.5 mg dILY DASH diet and commitment to daily physical activity for a minimum of 30 minutes discussed and encouraged, as a part of hypertension management. The importance of attaining a healthy weight is also discussed.     07/24/2023    4:12 PM 07/24/2023    3:26 PM 05/11/2023   10:35 AM 04/09/2023    1:27 PM 03/20/2023    1:57 PM 03/14/2023    2:24 PM 12/27/2022    2:14 PM  BP/Weight  Systolic BP 144 145 140 145 136 130 --  Diastolic BP 70 70 72 65 71 64 --  Wt. (Lbs)  159.12 164 165 166.12 164.2 166  BMI  27.31 kg/m2 28.15 kg/m2 28.32 kg/m2 28.51 kg/m2 28.18 kg/m2 28.49 kg/m2       Depression, major, single episode, in partial remission (HCC) Controlled, no change in medication   Hypothyroidism CONTROLLED WHEN CHECKED  Updated lab needed at/ before next visit.   Prediabetes Patient educated about the importance of limiting  Carbohydrate intake , the need to commit to daily physical activity for a minimum of 30 minutes , and to commit weight loss. The fact that changes in all these areas will reduce or eliminate all together the development of diabetes is stressed.      Latest Ref Rng &  Units 03/22/2023   11:24 AM 12/08/2022   10:36 AM 08/03/2022   11:13 AM 03/31/2022    1:13 PM 01/04/2022   11:47 AM  Diabetic Labs  HbA1c 4.8 - 5.6 % 6.4  6.1   6.4  6.4   Chol 100 - 199 mg/dL 045  409  811  914  782   HDL >39 mg/dL 42  36  43  40  39   Calc LDL 0 - 99 mg/dL 59  89  87  956  213   Triglycerides 0 - 149 mg/dL 086  578  469  629  528   Creatinine 0.57 - 1.00 mg/dL 4.13  2.44  0.10  2.72  0.87       07/24/2023    4:12 PM 07/24/2023    3:26 PM 05/11/2023   10:35 AM 04/09/2023    1:27 PM 03/20/2023    1:57 PM 03/14/2023    2:24 PM 12/27/2022    2:14 PM  BP/Weight  Systolic BP 144 145 140 145 136 130 --  Diastolic BP 70 70 72 65 71 64 --  Wt. (Lbs)  159.12 164 165 166.12 164.2 166  BMI  27.31 kg/m2 28.15 kg/m2 28.32 kg/m2 28.51 kg/m2 28.18 kg/m2 28.49 kg/m2       No data to display          Updated lab needed at/ before next visit.   Low back pain with left-sided sciatica UNCONTROLLED WITH CURRENT FLARE, TORADOL  60 MG Im IN OFFICE FOR PAIN  Aortic stenosis Asymptomatic currently will be closely monitored by Cardiology  Insomnia Increased difficulty both falling and staying asleep noted in past 12 months  Sleep hygiene reviewed Belsomra mg at bedtimme , review in 8 weeks  Fibromyalgia Uncontrolled flare x 2 weeks, Toradol  60 mg IM in office

## 2023-07-24 NOTE — Assessment & Plan Note (Signed)
 CONTROLLED WHEN CHECKED  Updated lab needed at/ before next visit.

## 2023-07-24 NOTE — Assessment & Plan Note (Signed)
 Uncontrolled add amlodipine  2.5 mg  DASH diet and commitment to daily physical activity for a minimum of 30 minutes discussed and encouraged, as a part of hypertension management. The importance of attaining a healthy weight is also discussed.     07/24/2023    4:12 PM 07/24/2023    3:26 PM 05/11/2023   10:35 AM 04/09/2023    1:27 PM 03/20/2023    1:57 PM 03/14/2023    2:24 PM 12/27/2022    2:14 PM  BP/Weight  Systolic BP 144 145 140 145 136 130 --  Diastolic BP 70 70 72 65 71 64 --  Wt. (Lbs)  159.12 164 165 166.12 164.2 166  BMI  27.31 kg/m2 28.15 kg/m2 28.32 kg/m2 28.51 kg/m2 28.18 kg/m2 28.49 kg/m2     Updated lab needed at/ before next visit.

## 2023-07-24 NOTE — Assessment & Plan Note (Signed)
 Patient educated about the importance of limiting  Carbohydrate intake , the need to commit to daily physical activity for a minimum of 30 minutes , and to commit weight loss. The fact that changes in all these areas will reduce or eliminate all together the development of diabetes is stressed.      Latest Ref Rng & Units 03/22/2023   11:24 AM 12/08/2022   10:36 AM 08/03/2022   11:13 AM 03/31/2022    1:13 PM 01/04/2022   11:47 AM  Diabetic Labs  HbA1c 4.8 - 5.6 % 6.4  6.1   6.4  6.4   Chol 100 - 199 mg/dL 161  096  045  409  811   HDL >39 mg/dL 42  36  43  40  39   Calc LDL 0 - 99 mg/dL 59  89  87  914  782   Triglycerides 0 - 149 mg/dL 956  213  086  578  469   Creatinine 0.57 - 1.00 mg/dL 6.29  5.28  4.13  2.44  0.87       07/24/2023    4:12 PM 07/24/2023    3:26 PM 05/11/2023   10:35 AM 04/09/2023    1:27 PM 03/20/2023    1:57 PM 03/14/2023    2:24 PM 12/27/2022    2:14 PM  BP/Weight  Systolic BP 144 145 140 145 136 130 --  Diastolic BP 70 70 72 65 71 64 --  Wt. (Lbs)  159.12 164 165 166.12 164.2 166  BMI  27.31 kg/m2 28.15 kg/m2 28.32 kg/m2 28.51 kg/m2 28.18 kg/m2 28.49 kg/m2       No data to display          Updated lab needed at/ before next visit.

## 2023-07-24 NOTE — Assessment & Plan Note (Signed)
 Controlled, no change in medication

## 2023-07-24 NOTE — Assessment & Plan Note (Signed)
 UNCONTROLLED WITH CURRENT FLARE, TORADOL  60 MG Im IN OFFICE FOR PAIN

## 2023-07-24 NOTE — Assessment & Plan Note (Addendum)
 Increased difficulty both falling and staying asleep noted in past 12 months  Sleep hygiene reviewed Belsomra mg at bedtimme , review in 8 weeks

## 2023-07-24 NOTE — Assessment & Plan Note (Signed)
 Asymptomatic currently will be closely monitored by Cardiology

## 2023-07-24 NOTE — Assessment & Plan Note (Signed)
 Uncontrolled add amlodipine  2.5 mg dILY DASH diet and commitment to daily physical activity for a minimum of 30 minutes discussed and encouraged, as a part of hypertension management. The importance of attaining a healthy weight is also discussed.     07/24/2023    4:12 PM 07/24/2023    3:26 PM 05/11/2023   10:35 AM 04/09/2023    1:27 PM 03/20/2023    1:57 PM 03/14/2023    2:24 PM 12/27/2022    2:14 PM  BP/Weight  Systolic BP 144 145 140 145 136 130 --  Diastolic BP 70 70 72 65 71 64 --  Wt. (Lbs)  159.12 164 165 166.12 164.2 166  BMI  27.31 kg/m2 28.15 kg/m2 28.32 kg/m2 28.51 kg/m2 28.18 kg/m2 28.49 kg/m2

## 2023-07-25 ENCOUNTER — Other Ambulatory Visit: Payer: Self-pay

## 2023-07-25 DIAGNOSIS — Z1211 Encounter for screening for malignant neoplasm of colon: Secondary | ICD-10-CM

## 2023-07-25 LAB — BMP8+EGFR
BUN/Creatinine Ratio: 26 (ref 12–28)
BUN: 23 mg/dL (ref 8–27)
CO2: 20 mmol/L (ref 20–29)
Calcium: 10.3 mg/dL (ref 8.7–10.3)
Chloride: 103 mmol/L (ref 96–106)
Creatinine, Ser: 0.9 mg/dL (ref 0.57–1.00)
Glucose: 94 mg/dL (ref 70–99)
Potassium: 4.2 mmol/L (ref 3.5–5.2)
Sodium: 139 mmol/L (ref 134–144)
eGFR: 63 mL/min/{1.73_m2} (ref >59)

## 2023-07-25 LAB — TSH+FREE T4
Free T4: 1.52 ng/dL (ref 0.82–1.77)
TSH: 2.5 u[IU]/mL (ref 0.450–4.500)

## 2023-07-25 LAB — HEMOGLOBIN A1C
Est. average glucose Bld gHb Est-mCnc: 128 mg/dL
Hgb A1c MFr Bld: 6.1 % — ABNORMAL HIGH (ref 4.8–5.6)

## 2023-07-26 ENCOUNTER — Ambulatory Visit: Payer: Self-pay | Admitting: Family Medicine

## 2023-08-04 DIAGNOSIS — M797 Fibromyalgia: Secondary | ICD-10-CM | POA: Diagnosis not present

## 2023-08-04 DIAGNOSIS — M47816 Spondylosis without myelopathy or radiculopathy, lumbar region: Secondary | ICD-10-CM | POA: Diagnosis not present

## 2023-08-10 ENCOUNTER — Encounter: Admitting: Physical Medicine & Rehabilitation

## 2023-08-12 ENCOUNTER — Ambulatory Visit: Payer: Self-pay | Admitting: Family Medicine

## 2023-08-12 LAB — COLOGUARD

## 2023-08-13 ENCOUNTER — Encounter: Payer: Self-pay | Admitting: Family Medicine

## 2023-08-14 DIAGNOSIS — M797 Fibromyalgia: Secondary | ICD-10-CM | POA: Diagnosis not present

## 2023-08-14 DIAGNOSIS — M47816 Spondylosis without myelopathy or radiculopathy, lumbar region: Secondary | ICD-10-CM | POA: Diagnosis not present

## 2023-08-21 DIAGNOSIS — Z1211 Encounter for screening for malignant neoplasm of colon: Secondary | ICD-10-CM | POA: Diagnosis not present

## 2023-08-21 LAB — COLOGUARD: Cologuard: NEGATIVE

## 2023-08-28 LAB — COLOGUARD: COLOGUARD: NEGATIVE
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
## 2023-09-03 DIAGNOSIS — M47816 Spondylosis without myelopathy or radiculopathy, lumbar region: Secondary | ICD-10-CM | POA: Diagnosis not present

## 2023-09-03 DIAGNOSIS — M797 Fibromyalgia: Secondary | ICD-10-CM | POA: Diagnosis not present

## 2023-09-04 ENCOUNTER — Ambulatory Visit: Admitting: Rheumatology

## 2023-09-04 DIAGNOSIS — M19042 Primary osteoarthritis, left hand: Secondary | ICD-10-CM

## 2023-09-04 DIAGNOSIS — Z8679 Personal history of other diseases of the circulatory system: Secondary | ICD-10-CM

## 2023-09-04 DIAGNOSIS — M19071 Primary osteoarthritis, right ankle and foot: Secondary | ICD-10-CM

## 2023-09-04 DIAGNOSIS — M51369 Other intervertebral disc degeneration, lumbar region without mention of lumbar back pain or lower extremity pain: Secondary | ICD-10-CM

## 2023-09-04 DIAGNOSIS — M5030 Other cervical disc degeneration, unspecified cervical region: Secondary | ICD-10-CM

## 2023-09-04 DIAGNOSIS — M797 Fibromyalgia: Secondary | ICD-10-CM

## 2023-09-04 DIAGNOSIS — E559 Vitamin D deficiency, unspecified: Secondary | ICD-10-CM

## 2023-09-04 DIAGNOSIS — M7061 Trochanteric bursitis, right hip: Secondary | ICD-10-CM

## 2023-09-04 DIAGNOSIS — M170 Bilateral primary osteoarthritis of knee: Secondary | ICD-10-CM

## 2023-09-04 DIAGNOSIS — M62838 Other muscle spasm: Secondary | ICD-10-CM

## 2023-09-04 DIAGNOSIS — Z8639 Personal history of other endocrine, nutritional and metabolic disease: Secondary | ICD-10-CM

## 2023-09-04 DIAGNOSIS — Z8669 Personal history of other diseases of the nervous system and sense organs: Secondary | ICD-10-CM

## 2023-09-04 DIAGNOSIS — R5383 Other fatigue: Secondary | ICD-10-CM

## 2023-09-04 DIAGNOSIS — M8589 Other specified disorders of bone density and structure, multiple sites: Secondary | ICD-10-CM

## 2023-09-13 DIAGNOSIS — M797 Fibromyalgia: Secondary | ICD-10-CM | POA: Diagnosis not present

## 2023-09-13 DIAGNOSIS — M47816 Spondylosis without myelopathy or radiculopathy, lumbar region: Secondary | ICD-10-CM | POA: Diagnosis not present

## 2023-09-14 ENCOUNTER — Other Ambulatory Visit: Payer: Self-pay | Admitting: Family Medicine

## 2023-09-18 ENCOUNTER — Ambulatory Visit (INDEPENDENT_AMBULATORY_CARE_PROVIDER_SITE_OTHER): Admitting: Family Medicine

## 2023-09-18 ENCOUNTER — Encounter: Payer: Self-pay | Admitting: Family Medicine

## 2023-09-18 VITALS — BP 145/75 | HR 74 | Resp 16 | Ht 64.0 in | Wt 161.0 lb

## 2023-09-18 DIAGNOSIS — E038 Other specified hypothyroidism: Secondary | ICD-10-CM

## 2023-09-18 DIAGNOSIS — F5104 Psychophysiologic insomnia: Secondary | ICD-10-CM

## 2023-09-18 DIAGNOSIS — R52 Pain, unspecified: Secondary | ICD-10-CM

## 2023-09-18 DIAGNOSIS — I10 Essential (primary) hypertension: Secondary | ICD-10-CM

## 2023-09-18 DIAGNOSIS — F324 Major depressive disorder, single episode, in partial remission: Secondary | ICD-10-CM

## 2023-09-18 DIAGNOSIS — E782 Mixed hyperlipidemia: Secondary | ICD-10-CM | POA: Diagnosis not present

## 2023-09-18 DIAGNOSIS — Z9181 History of falling: Secondary | ICD-10-CM

## 2023-09-18 DIAGNOSIS — J3089 Other allergic rhinitis: Secondary | ICD-10-CM | POA: Diagnosis not present

## 2023-09-18 DIAGNOSIS — L989 Disorder of the skin and subcutaneous tissue, unspecified: Secondary | ICD-10-CM | POA: Diagnosis not present

## 2023-09-18 MED ORDER — KETOROLAC TROMETHAMINE 60 MG/2ML IM SOLN
30.0000 mg | Freq: Once | INTRAMUSCULAR | Status: AC
  Administered 2023-09-18: 30 mg via INTRAMUSCULAR

## 2023-09-18 MED ORDER — AMLODIPINE BESYLATE 5 MG PO TABS: 5.0000 mg | ORAL_TABLET | Freq: Every day | ORAL | 1 refills | Status: DC

## 2023-09-18 NOTE — Patient Instructions (Addendum)
 Annual exam early November  yOU ARE REFERRED TO DERMATOLOGY  New higher dose amlodipine  5 mg one daily, stop amlodipine  2.5 mg tablet   nURSE Bp CHECK IN 3 WEEKS ON A FRIDAY PLEASE  Toradol  30 mg IM for pian on office todAY  NURSE PLS ABSTRACT COLOGUARD RESULT SO COLONOSCOPY DUE IS REMOVED  Thanks for choosing [facility], we consider it a privelige to serve you.

## 2023-09-20 ENCOUNTER — Ambulatory Visit: Admitting: Rheumatology

## 2023-09-23 NOTE — Assessment & Plan Note (Signed)
 [place] safety reviewed no falls since past 1 year

## 2023-09-23 NOTE — Assessment & Plan Note (Signed)
 Controlled, no change in medication

## 2023-09-23 NOTE — Assessment & Plan Note (Signed)
 Uncontrolled , inc amlodipine  dose to 5 mg daily re eval in 3 weeks DASH diet and commitment to daily physical activity for a minimum of 30 minutes discussed and encouraged, as a part of hypertension management. The importance of attaining a healthy weight is also discussed.     09/18/2023    3:03 PM 07/24/2023    4:12 PM 07/24/2023    3:26 PM 05/11/2023   10:35 AM 04/09/2023    1:27 PM 03/20/2023    1:57 PM 03/14/2023    2:24 PM  BP/Weight  Systolic BP 145 144 145 140 145 136 130  Diastolic BP 75 70 70 72 65 71 64  Wt. (Lbs) 161.04  159.12 164 165 166.12 164.2  BMI 27.64 kg/m2  27.31 kg/m2 28.15 kg/m2 28.32 kg/m2 28.51 kg/m2 28.18 kg/m2

## 2023-09-23 NOTE — Assessment & Plan Note (Signed)
 Uncontrolled Toradol  30 mg IM in office

## 2023-09-23 NOTE — Assessment & Plan Note (Signed)
 Hyperlipidemia:Low fat diet discussed and encouraged.   Lipid Panel  Lab Results  Component Value Date   CHOL 125 03/22/2023   HDL 42 03/22/2023   LDLCALC 59 03/22/2023   LDLDIRECT 141 (H) 10/31/2007   TRIG 137 03/22/2023   CHOLHDL 3.0 03/22/2023     Not at goal  Updated lab needed at/ before next visit.

## 2023-09-23 NOTE — Assessment & Plan Note (Signed)
 Persistent/ recurrent lesion on neck and upper back, Dermatology eval per pt request

## 2023-09-23 NOTE — Progress Notes (Signed)
 Andrea Santiago     MRN: [id number]      DOB: 29-Jan-1941  Chief Complaint  Patient presents with   Medical Management of Chronic Issues    Follow up. Pt is asking for injection for general body pain from arthritis    bumps    Pt wants to get referral to dermatology for bumps around her mouth and back     HPI Andrea Santiago is here for follow up and re-evaluation of chronic medical conditions, medication management and review of any available recent lab and radiology data.  Preventive health is updated, specifically  Cancer screening and Immunization.   Questions or concerns regarding consultations or procedures which the PT has had in the interim are  addressed. The PT denies any adverse reactions to current medications since the last visit.  Concerns are as above  ROS Denies recent fever or chills. Denies sinus pressure, nasal congestion, ear pain or sore throat. Denies chest congestion, productive cough or wheezing. Denies chest pains, palpitations and leg swelling Denies abdominal pain, nausea, vomiting,diarrhea or constipation.   Denies dysuria, frequency, hesitancy or incontinence.   PE  BP (!) 145/75   Pulse 74   Resp 16   Ht 5' 4 (1.626 m)   Wt 161 lb 0.6 oz (73 kg)   SpO2 97%   BMI 27.64 kg/m   Patient alert and oriented and in no cardiopulmonary distress.  HEENT: No facial asymmetry, EOMI,     Neck supple .  Chest: Clear to auscultation bilaterally.  CVS: S1, S2 no murmurs, no S3.Regular rate.  ABD: Soft non tender.   Ext: No edema  MS: decreased ROM spine, shoulders, hips and knees.  Skin: Intact, macula papular lesions hyperemic on neck and upper back.  Psych: Good eye contact, normal affect. Memory intact not anxious or depressed appearing.  CNS: CN 2-12 intact, power,  normal throughout.no focal deficits noted.   Assessment & Plan  Essential hypertension Uncontrolled , inc amlodipine  dose to 5 mg daily re eval in 3 weeks DASH diet and  commitment to daily physical activity for a minimum of 30 minutes discussed and encouraged, as a part of hypertension management. The importance of attaining a healthy weight is also discussed.     09/18/2023    3:03 PM 07/24/2023    4:12 PM 07/24/2023    3:26 PM 05/11/2023   10:35 AM 04/09/2023    1:27 PM 03/20/2023    1:57 PM 03/14/2023    2:24 PM  BP/Weight  Systolic BP 145 144 145 140 145 136 130  Diastolic BP 75 70 70 72 65 71 64  Wt. (Lbs) 161.04  159.12 164 165 166.12 164.2  BMI 27.64 kg/m2  27.31 kg/m2 28.15 kg/m2 28.32 kg/m2 28.51 kg/m2 28.18 kg/m2       Generalized pain Uncontrolled Toradol  30 mg IM in office  Skin lesions, generalized Persistent/ recurrent lesion on neck and upper back, Dermatology eval per pt request  Mixed hyperlipidemia Hyperlipidemia:Low fat diet discussed and encouraged.   Lipid Panel  Lab Results  Component Value Date   CHOL 125 03/22/2023   HDL 42 03/22/2023   LDLCALC 59 03/22/2023   LDLDIRECT 141 (H) 10/31/2007   TRIG 137 03/22/2023   CHOLHDL 3.0 03/22/2023     Not at goal  Updated lab needed at/ before next visit.   Insomnia Controlled, no change in medication   Hypothyroidism Controlled, no change in medication   Allergic rhinitis Controlled, no change in medication  At high risk for falls [place] safety reviewed no falls since past 1 year  Depression, major, single episode, in partial remission (HCC) Controlled, no change in medication

## 2023-09-25 ENCOUNTER — Other Ambulatory Visit: Payer: Self-pay | Admitting: Physical Medicine & Rehabilitation

## 2023-09-25 ENCOUNTER — Other Ambulatory Visit: Payer: Self-pay | Admitting: Family Medicine

## 2023-09-27 ENCOUNTER — Ambulatory Visit: Admitting: Physical Medicine & Rehabilitation

## 2023-10-12 ENCOUNTER — Ambulatory Visit (INDEPENDENT_AMBULATORY_CARE_PROVIDER_SITE_OTHER)

## 2023-10-12 VITALS — BP 127/76

## 2023-10-12 DIAGNOSIS — I10 Essential (primary) hypertension: Secondary | ICD-10-CM

## 2023-10-12 NOTE — Progress Notes (Signed)
 Patient is in office today for a nurse visit for Blood Pressure Check. Patient has no complaints and bp is in the normal range. Advised to continue medications as prescribed and keep her next appt.

## 2023-10-14 DIAGNOSIS — M797 Fibromyalgia: Secondary | ICD-10-CM | POA: Diagnosis not present

## 2023-10-14 DIAGNOSIS — M47816 Spondylosis without myelopathy or radiculopathy, lumbar region: Secondary | ICD-10-CM | POA: Diagnosis not present

## 2023-10-23 ENCOUNTER — Other Ambulatory Visit: Payer: Self-pay | Admitting: Family Medicine
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
## 2023-11-09 ENCOUNTER — Telehealth: Payer: Self-pay | Admitting: Family Medicine

## 2023-11-09 NOTE — Telephone Encounter (Signed)
 Please advise Copied from CRM [id number]. Topic: Referral - Question >> Nov 09, 2023  1:57 PM Debby BROCKS wrote: Reason for CRM: Patient was given a referral to see Dr. Shona but they do not take the patients insurance. She would like to get referred to someone else

## 2023-11-12 ENCOUNTER — Ambulatory Visit: Payer: Self-pay

## 2023-11-12 NOTE — Telephone Encounter (Signed)
 FYI Only or Action Required?: Action required by provider: request for appointment.  Patient was last seen in primary care on 09/18/2023 by Antonetta Rollene BRAVO, MD.  Called Nurse Triage reporting Migraine.  Symptoms began several years ago.  Interventions attempted: Nothing.  Symptoms are: unchanged.  Triage Disposition: See Physician Within 24 Hours  Patient/caregiver understands and will follow disposition?: YesCopied from CRM #[id number]. Topic: Clinical - Red Word Triage >> Nov 12, 2023  2:56 PM Ivette P wrote: [facility] that prompted transfer to Nurse Triage: sever migraine, pt unable to go outside Reason for Disposition  [1] MODERATE headache (e.g., interferes with normal activities) AND [2] present > 24 hours AND [3] unexplained  (Exceptions: Pain medicines not tried, typical migraine, or headache part of viral illness.)  Answer Assessment - Initial Assessment Questions Has had migraines on and off whole life but have recenetly returned. No office appts tomorrow. Pt is seeing Dr Bluford at [facility] at 1410 tomorrow.       1. LOCATION: Where does it hurt?      All over whole head 2. ONSET: When did the headache start? (e.g., minutes, hours, days)      On and off throughout life 3. PATTERN: Does the pain come and go, or has it been constant since it started?     Comes and goes 4. SEVERITY: How bad is the pain? and What does it keep you from doing?  (e.g., Scale 1-10; mild, moderate, or severe)     15 5. RECURRENT SYMPTOM: Have you ever had headaches before? If Yes, ask: When was the last time? and What happened that time?      yes 6. CAUSE: What do you think is causing the headache?     Not sure 7. MIGRAINE: Have you been diagnosed with migraine headaches? If Yes, ask: Is this headache similar?      yes 8. HEAD INJURY: Has there been any recent injury to your head?      denies 9. OTHER SYMPTOMS: Do you have any other symptoms? (e.g., fever, stiff neck, eye  pain, sore throat, cold symptoms)     Nausea/vomiting if severe  Protocols used: Headache-A-AH

## 2023-11-12 NOTE — Telephone Encounter (Signed)
 Patient scheduled 9/23 with Dr Bluford

## 2023-11-13 ENCOUNTER — Telehealth: Payer: Self-pay | Admitting: Pharmacist

## 2023-11-13 ENCOUNTER — Other Ambulatory Visit: Payer: Self-pay | Admitting: Family Medicine

## 2023-11-13 ENCOUNTER — Ambulatory Visit (INDEPENDENT_AMBULATORY_CARE_PROVIDER_SITE_OTHER): Admitting: Family Medicine

## 2023-11-13 VITALS — BP 153/68 | HR 72 | Ht 64.0 in | Wt 161.0 lb

## 2023-11-13 DIAGNOSIS — G43709 Chronic migraine without aura, not intractable, without status migrainosus: Secondary | ICD-10-CM

## 2023-11-13 MED ORDER — NURTEC 75 MG PO TBDP: ORAL_TABLET | ORAL | 0 refills | Status: DC

## 2023-11-13 NOTE — Telephone Encounter (Addendum)
 Nurtec approved for migraine prevention Need to change sig to below Let me know if I can assist!  Migraine; Prophylaxis: 75 mg orally once every other day placed on or under the tongue

## 2023-11-13 NOTE — Patient Instructions (Signed)
 I sent in a medication for you. We will see if we can get it approved.   Follow up with Dr. Antonetta.

## 2023-11-14 DIAGNOSIS — M797 Fibromyalgia: Secondary | ICD-10-CM | POA: Diagnosis not present

## 2023-11-14 DIAGNOSIS — M47816 Spondylosis without myelopathy or radiculopathy, lumbar region: Secondary | ICD-10-CM | POA: Diagnosis not present

## 2023-11-14 NOTE — Assessment & Plan Note (Signed)
 Given age, HTN, and known coronary disease Triptans are not a wise choice. Nurtec sent in. Needs follow up with PCP. May benefit from Neuro consultation.

## 2023-11-14 NOTE — Progress Notes (Signed)
 Subjective:  Patient ID: Andrea Santiago, female    DOB: 02-Mar-1940  Age: 83 y.o. MRN: [id number]  CC:   Chief Complaint  Patient presents with   Migraine    HPI:  83 year old female presents for evaluation of the above.  Long standing history of migraine. States that she has migraines since she was a teenager.  No medications for this currently. Recent migraine Sunday/Monday. She has a mild headache currently. States she is having migraines ~ 2x/month. Typical symptoms with migraine - nausea, photophobia.  Patient Active Problem List   Diagnosis Date Noted   Depression, major, single episode, in partial remission 08/14/2022   Insomnia 05/07/2022   Encounter for Medicare annual examination with abnormal findings 07/17/2021   Aortic stenosis 12/22/2020   Chronic migraine without aura without status migrainosus, not intractable 04/20/2019   Chronic right SI joint pain 09/11/2017   Hip pain, chronic, right 09/11/2017   Essential hypertension 05/05/2017   Osteopenia of multiple sites 05/29/2016   Vitamin D  deficiency 05/25/2016   Primary osteoarthritis of both hands 05/11/2016   Primary osteoarthritis of both feet 05/11/2016   DJD (degenerative joint disease), cervical 05/11/2016   Primary osteoarthritis of both knees 05/11/2016   Fibromyalgia 12/18/2015   Hypothyroidism 11/23/2015   Lumbar stenosis with neurogenic claudication 06/21/2015   At high risk for falls 03/28/2015   Multinodular goiter 03/30/2014   CAD (coronary atherosclerotic disease) 03/12/2013   Allergic rhinitis 06/12/2011   Prediabetes 10/26/2009   Overweight 11/22/2008   Low back pain with left-sided sciatica 03/24/2008   Mixed hyperlipidemia 03/06/2006   GERD 03/06/2006    Social Hx   Social History   Socioeconomic History   Marital status: Divorced    Spouse name: Not on file   Number of children: 1   Years of education: Not on file   Highest education level: Not on file  Occupational History    Occupation: Disabled   Occupation: retired    Associate Professor: RETIRED    Comment: cleaning business  Tobacco Use   Smoking status: Former    Current packs/day: 0.00    Average packs/day: 0.5 packs/day for 1 year (0.5 ttl pk-yrs)    Types: Cigarettes    Start date: 09/30/1961    Quit date: 10/01/1962    Years since quitting: 61.1    Passive exposure: Never   Smokeless tobacco: Never   Tobacco comments:    smoked only 1 year in her whole life  Vaping Use   Vaping status: Never Used  Substance and Sexual Activity   Alcohol use: No    Alcohol/week: 0.0 standard drinks of alcohol   Drug use: No   Sexual activity: Not Currently  Other Topics Concern   Not on file  Social History Narrative   Not on file   Social Drivers of Health   Financial Resource Strain: Low Risk  (12/27/2022)   Overall Financial Resource Strain (CARDIA)    Difficulty of Paying Living Expenses: Not very hard  Food Insecurity: No Food Insecurity (12/27/2022)   Hunger Vital Sign    Worried About Running Out of Food in the Last Year: Never true    Ran Out of Food in the Last Year: Never true  Transportation Needs: No Transportation Needs (12/27/2022)   PRAPARE - Administrator, Civil Service (Medical): No    Lack of Transportation (Non-Medical): No  Physical Activity: Inactive (12/27/2022)   Exercise Vital Sign    Days of Exercise per  Week: 0 days    Minutes of Exercise per Session: 0 min  Stress: No Stress Concern Present (12/27/2022)   [employer] of Occupational Health - Occupational Stress Questionnaire    Feeling of Stress : Only a little  Social Connections: Moderately Isolated (12/27/2022)   Social Connection and Isolation Panel    Frequency of Communication with Friends and Family: More than three times a week    Frequency of Social Gatherings with Friends and Family: Three times a week    Attends Religious Services: More than 4 times per year    Active Member of Clubs or Organizations: No     Attends Banker Meetings: Never    Marital Status: Divorced    Review of Systems Per HPI  Objective:  BP (!) 153/68   Pulse 72   Ht 5' 4 (1.626 m)   Wt 161 lb (73 kg)   SpO2 100%   BMI 27.64 kg/m      11/13/2023    2:20 PM 11/13/2023    2:19 PM 10/12/2023    4:11 PM  BP/Weight  Systolic BP 153 155 127  Diastolic BP 68 72 76  Wt. (Lbs)  161   BMI  27.64 kg/m2     Physical Exam Vitals and nursing note reviewed.  Constitutional:      General: She is not in acute distress.    Appearance: Normal appearance.  HENT:     Head: Normocephalic and atraumatic.  Eyes:     General:        Right eye: No discharge.        Left eye: No discharge.     Conjunctiva/sclera: Conjunctivae normal.     Pupils: Pupils are equal, round, and reactive to light.  Cardiovascular:     Rate and Rhythm: Normal rate and regular rhythm.  Pulmonary:     Effort: Pulmonary effort is normal.     Breath sounds: Normal breath sounds.  Neurological:     General: No focal deficit present.     Mental Status: She is alert.  Psychiatric:        Mood and Affect: Mood normal.        Behavior: Behavior normal.     Lab Results  Component Value Date   WBC 9.5 12/08/2022   HGB 13.7 12/08/2022   HCT 42.2 12/08/2022   PLT 280 12/08/2022   GLUCOSE 94 07/24/2023   CHOL 125 03/22/2023   TRIG 137 03/22/2023   HDL 42 03/22/2023   LDLDIRECT 141 (H) 10/31/2007   LDLCALC 59 03/22/2023   ALT 18 03/22/2023   AST 26 03/22/2023   NA 139 07/24/2023   K 4.2 07/24/2023   CL 103 07/24/2023   CREATININE 0.90 07/24/2023   BUN 23 07/24/2023   CO2 20 07/24/2023   TSH 2.500 07/24/2023   HGBA1C 6.1 (H) 07/24/2023     Assessment & Plan:  Chronic migraine without aura without status migrainosus, not intractable Assessment & Plan: Given age, HTN, and known coronary disease Triptans are not a wise choice. Nurtec sent in. Needs follow up with PCP. May benefit from Neuro consultation.     Follow-up:   Follow up with PCP  Jacqulyn Ahle DO [facility]

## 2023-11-19 ENCOUNTER — Other Ambulatory Visit: Payer: Self-pay | Admitting: Family Medicine

## 2023-11-21 ENCOUNTER — Ambulatory Visit: Admitting: Rheumatology

## 2023-11-21 DIAGNOSIS — Z8669 Personal history of other diseases of the nervous system and sense organs: Secondary | ICD-10-CM

## 2023-11-21 DIAGNOSIS — Z8639 Personal history of other endocrine, nutritional and metabolic disease: Secondary | ICD-10-CM

## 2023-11-21 DIAGNOSIS — M8589 Other specified disorders of bone density and structure, multiple sites: Secondary | ICD-10-CM

## 2023-11-21 DIAGNOSIS — M62838 Other muscle spasm: Secondary | ICD-10-CM

## 2023-11-21 DIAGNOSIS — R5383 Other fatigue: Secondary | ICD-10-CM

## 2023-11-21 DIAGNOSIS — M170 Bilateral primary osteoarthritis of knee: Secondary | ICD-10-CM

## 2023-11-21 DIAGNOSIS — M47816 Spondylosis without myelopathy or radiculopathy, lumbar region: Secondary | ICD-10-CM

## 2023-11-21 DIAGNOSIS — E559 Vitamin D deficiency, unspecified: Secondary | ICD-10-CM

## 2023-11-21 DIAGNOSIS — M5030 Other cervical disc degeneration, unspecified cervical region: Secondary | ICD-10-CM

## 2023-11-21 DIAGNOSIS — Z8679 Personal history of other diseases of the circulatory system: Secondary | ICD-10-CM

## 2023-11-21 DIAGNOSIS — M19071 Primary osteoarthritis, right ankle and foot: Secondary | ICD-10-CM

## 2023-11-21 DIAGNOSIS — M7061 Trochanteric bursitis, right hip: Secondary | ICD-10-CM

## 2023-11-21 DIAGNOSIS — M19041 Primary osteoarthritis, right hand: Secondary | ICD-10-CM

## 2023-11-21 DIAGNOSIS — M797 Fibromyalgia: Secondary | ICD-10-CM

## 2023-11-22 ENCOUNTER — Telehealth: Payer: Self-pay | Admitting: Rheumatology

## 2023-11-22 NOTE — Telephone Encounter (Signed)
 Patient left a voicemail (11/21/23 at 5:30 pm) stating she missed her appointment due to a migraine and wanted to let Dr. Dolphus know that she is very sorry.

## 2023-12-03 DIAGNOSIS — Z961 Presence of intraocular lens: Secondary | ICD-10-CM | POA: Diagnosis not present

## 2023-12-03 DIAGNOSIS — H52223 Regular astigmatism, bilateral: Secondary | ICD-10-CM | POA: Diagnosis not present

## 2023-12-03 DIAGNOSIS — H5203 Hypermetropia, bilateral: Secondary | ICD-10-CM | POA: Diagnosis not present

## 2023-12-03 DIAGNOSIS — H524 Presbyopia: Secondary | ICD-10-CM | POA: Diagnosis not present

## 2023-12-03 DIAGNOSIS — H43813 Vitreous degeneration, bilateral: Secondary | ICD-10-CM | POA: Diagnosis not present

## 2023-12-07 ENCOUNTER — Other Ambulatory Visit: Payer: Self-pay | Admitting: Family Medicine

## 2023-12-07 ENCOUNTER — Ambulatory Visit: Admitting: Cardiology

## 2023-12-14 DIAGNOSIS — M797 Fibromyalgia: Secondary | ICD-10-CM | POA: Diagnosis not present

## 2023-12-14 DIAGNOSIS — M47816 Spondylosis without myelopathy or radiculopathy, lumbar region: Secondary | ICD-10-CM | POA: Diagnosis not present

## 2023-12-27 ENCOUNTER — Other Ambulatory Visit: Payer: Self-pay

## 2023-12-27 ENCOUNTER — Ambulatory Visit: Payer: Self-pay

## 2023-12-27 MED ORDER — PREDNISONE 5 MG PO TABS: 5.0000 mg | ORAL_TABLET | Freq: Two times a day (BID) | ORAL | 0 refills | Status: DC

## 2023-12-27 MED ORDER — PREDNISONE 5 MG (21) PO TBPK: 5.0000 mg | ORAL_TABLET | ORAL | 0 refills | Status: DC

## 2023-12-27 NOTE — Addendum Note (Signed)
 Addended by: ANTONETTA ROLLENE BRAVO on: 12/27/2023 04:02 PM   Modules accepted: Orders

## 2023-12-27 NOTE — Telephone Encounter (Signed)
 Prednisone sent to pharmacy

## 2023-12-27 NOTE — Telephone Encounter (Signed)
 FYI Only or Action Required?: Action required by provider: clinical question for provider and patient is requesting antibiotics and cough medication. Needing a call back.  Patient was last seen in primary care on 11/13/2023 by Cook, Jayce G, DO.  Called Nurse Triage reporting Cough and Shortness of Breath.  Symptoms began yesterday.  Interventions attempted: Rest, hydration, or home remedies.  Symptoms are: unchanged.  Triage Disposition: See Physician Within 24 Hours  Patient/caregiver understands and will follow disposition?:   Copied from CRM #[id number]. Topic: Clinical - Red Word Triage >> Dec 27, 2023 11:15 AM Antwanette L wrote: Red Word that prompted transfer to Nurse Triage: The patient reports a persistent cough, sob,and chest pain that began yesterday, and is requesting medication for cough and bronchitis. Reason for Disposition  SEVERE coughing spells (e.g., whooping sound after coughing, vomiting after coughing)  Answer Assessment - Initial Assessment Questions Patient called with concerns for persistent cough and chest tightness. Patient states she has chronic bronchitis and is needing medication called in. Requesting cough medication and antibiotics. Needing a call back from office.   1. ONSET: When did the cough begin?      Started yesterday 2. SEVERITY: How bad is the cough today?      severe 3. SPUTUM: Describe the color of your sputum (e.g., none, dry cough; clear, white, yellow, green)     dry 4. HEMOPTYSIS: Are you coughing up any blood? If Yes, ask: How much? (e.g., flecks, streaks, tablespoons, etc.)     no 5. DIFFICULTY BREATHING: Are you having difficulty breathing? If Yes, ask: How bad is it? (e.g., mild, moderate, severe)      Can get shortness of breath when coughing 6. FEVER: Do you have a fever? If Yes, ask: What is your temperature, how was it measured, and when did it start?     no 7. CARDIAC HISTORY: Do you have any history of heart  disease? (e.g., heart attack, congestive heart failure)      yes 8. LUNG HISTORY: Do you have any history of lung disease?  (e.g., pulmonary embolus, asthma, emphysema)     yes 9. PE RISK FACTORS: Do you have a history of blood clots? (or: recent major surgery, recent prolonged travel, bedridden)     no 10. OTHER SYMPTOMS: Do you have any other symptoms? (e.g., runny nose, wheezing, chest pain)       Chest tightness 11. TRAVEL: Have you traveled out of the country in the last month? (e.g., travel history, exposures)       no  Protocols used: Cough - Acute Non-Productive-A-AH

## 2024-01-01 ENCOUNTER — Telehealth: Payer: Self-pay

## 2024-01-01 NOTE — Telephone Encounter (Signed)
 Copied from CRM [id number]. Topic: Clinical - Medication Question >> Dec 31, 2023  3:50 PM Andrea Santiago wrote: Reason for CRM: pt called saying she finished the antibiotic for her cough but she is not much better.  Please advise  [phone]

## 2024-01-02 ENCOUNTER — Ambulatory Visit (INDEPENDENT_AMBULATORY_CARE_PROVIDER_SITE_OTHER): Payer: Medicare HMO

## 2024-01-02 VITALS — Ht 64.0 in | Wt 152.0 lb

## 2024-01-02 DIAGNOSIS — Z0000 Encounter for general adult medical examination without abnormal findings: Secondary | ICD-10-CM | POA: Diagnosis not present
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
## 2024-01-02 NOTE — Patient Instructions (Signed)
 Ms. Duerr,  Thank you for taking the time for your Medicare Wellness Visit. I appreciate your continued commitment to your health goals. Please review the care plan we discussed, and feel free to reach out if I can assist you further.  Please note that Annual Wellness Visits do not include a physical exam. Some assessments may be limited, especially if the visit was conducted virtually. If needed, we may recommend an in-person follow-up with your provider.  Ongoing Care Seeing your primary care provider every 3 to 6 months helps us  monitor your health and provide consistent, personalized care.   Referrals If a referral was made during today's visit and you haven't received any updates within two weeks, please contact the referred provider directly to check on the status.   Recommended Screenings:  Health Maintenance  Topic Date Due   DTaP/Tdap/Td vaccine (2 - Tdap) 10/27/2019   COVID-19 Vaccine (7 - 2025-26 season) 10/22/2023   Flu Shot  05/20/2024*   Breast Cancer Screening  07/09/2024   Medicare Annual Wellness Visit  01/01/2025   DEXA scan (bone density measurement)  01/15/2025   Pneumococcal Vaccine for age over 4  Completed   Zoster (Shingles) Vaccine  Completed   Meningitis B Vaccine  Aged Out   Colon Cancer Screening  Discontinued  *Topic was postponed. The date shown is not the original due date.       01/02/2024    3:21 PM  Advanced Directives  Does Patient Have a Medical Advance Directive? No  Would patient like information on creating a medical advance directive? No - Patient declined    Vision: Annual vision screenings are recommended for early detection of glaucoma, cataracts, and diabetic retinopathy. These exams can also reveal signs of chronic conditions such as diabetes and high blood pressure.  Dental: Annual dental screenings help detect early signs of oral cancer, gum disease, and other conditions linked to overall health, including heart disease and  diabetes.  Please see the attached documents for additional preventive care recommendations.

## 2024-01-09 ENCOUNTER — Ambulatory Visit (HOSPITAL_COMMUNITY)
Admission: RE | Admit: 2024-01-09 | Discharge: 2024-01-09 | Disposition: A | Discharge status: Home / Self Care | Source: Ambulatory Visit | Attending: Family Medicine | Admitting: Family Medicine

## 2024-01-09 ENCOUNTER — Encounter: Payer: Self-pay | Admitting: Family Medicine

## 2024-01-09 ENCOUNTER — Ambulatory Visit: Payer: Self-pay | Admitting: Family Medicine

## 2024-01-09 VITALS — BP 128/84 | HR 71 | Resp 18 | Ht 64.0 in | Wt 154.1 lb

## 2024-01-09 DIAGNOSIS — R7303 Prediabetes: Secondary | ICD-10-CM | POA: Diagnosis not present

## 2024-01-09 DIAGNOSIS — E782 Mixed hyperlipidemia: Secondary | ICD-10-CM | POA: Diagnosis not present

## 2024-01-09 DIAGNOSIS — M48062 Spinal stenosis, lumbar region with neurogenic claudication: Secondary | ICD-10-CM

## 2024-01-09 DIAGNOSIS — I10 Essential (primary) hypertension: Secondary | ICD-10-CM

## 2024-01-09 DIAGNOSIS — M797 Fibromyalgia: Secondary | ICD-10-CM

## 2024-01-09 DIAGNOSIS — E039 Hypothyroidism, unspecified: Secondary | ICD-10-CM

## 2024-01-09 DIAGNOSIS — Z0001 Encounter for general adult medical examination with abnormal findings: Secondary | ICD-10-CM

## 2024-01-09 DIAGNOSIS — E559 Vitamin D deficiency, unspecified: Secondary | ICD-10-CM

## 2024-01-09 DIAGNOSIS — J40 Bronchitis, not specified as acute or chronic: Secondary | ICD-10-CM | POA: Diagnosis not present

## 2024-01-09 DIAGNOSIS — R059 Cough, unspecified: Secondary | ICD-10-CM | POA: Diagnosis not present

## 2024-01-09 MED ORDER — NURTEC 75 MG PO TBDP: ORAL_TABLET | ORAL | 3 refills | Status: AC

## 2024-01-09 MED ORDER — LEVOCETIRIZINE DIHYDROCHLORIDE 5 MG PO TABS: 5.0000 mg | ORAL_TABLET | Freq: Every evening | ORAL | 5 refills | Status: AC

## 2024-01-09 MED ORDER — BUDESONIDE-FORMOTEROL FUMARATE 160-4.5 MCG/ACT IN AERO: 2.0000 | INHALATION_SPRAY | Freq: Two times a day (BID) | RESPIRATORY_TRACT | 12 refills | Status: AC

## 2024-01-09 NOTE — Progress Notes (Unsigned)
    Andrea Santiago     MRN: [id number]      DOB: 04-27-1940  Chief Complaint  Patient presents with   Annual Exam    Cpe     HPI: Patient is in for annual physical exam. No other health concerns are expressed or addressed at the visit. Recent labs,  are reviewed. Immunization is reviewed , and  updated if needed.   PE: Pleasant  female, alert and oriented x 3, in no cardio-pulmonary distress. Afebrile. HEENT No facial trauma or asymetry. Sinuses non tender.  Extra occullar muscles intact.. External ears normal, . Neck: supple, no adenopathy,JVD or thyromegaly.No bruits.  Chest: Clear to ascultation bilaterally.No crackles or wheezes. Non tender to palpation  Breast: No asymetry,no masses or lumps. No tenderness. No nipple discharge or inversion. No axillary or supraclavicular adenopathy  Cardiovascular system; Heart sounds normal,  S1 and  S2 ,no S3.  No murmur, or thrill. Apical beat not displaced Peripheral pulses normal.  Abdomen: Soft, non tender, no organomegaly or masses. No bruits. Bowel sounds normal. No guarding, tenderness or rebound.   GU: External genitalia normal female genitalia , normal female distribution of hair. No lesions. Urethral meatus normal in size, no  Prolapse, no lesions visibly  Present. Bladder non tender. Vagina pink and moist , with no visible lesions , discharge present . Adequate pelvic support no  cystocele or rectocele noted Cervix pink and appears healthy, no lesions or ulcerations noted, no discharge noted from os Uterus normal size, no adnexal masses, no cervical motion or adnexal tenderness.   Musculoskeletal exam: Full ROM of spine, hips , shoulders and knees. No deformity ,swelling or crepitus noted. No muscle wasting or atrophy.   Neurologic: Cranial nerves 2 to 12 intact. Power, tone ,sensation and reflexes normal throughout. No disturbance in gait. No tremor.  Skin: Intact, no ulceration, erythema , scaling  or rash noted. Pigmentation normal throughout  Psych; Normal mood and affect. Judgement and concentration normal   Assessment & Plan:  No problem-specific Assessment & Plan notes found for this encounter.

## 2024-01-09 NOTE — Patient Instructions (Signed)
 F/U in 5 months  CBC, lipid, cmp and EGFr, hBA1C, TSH and vit D today  Nurse pls document her covid vaccine   CXR today  Pls get TdAp vaccine at your pharmacy  Pls bring in sputum for testing for infection  Xyzal tablet 1 at bedtime for allergies , and inhaler for daiy use are sent in  I will refer you to pain MD   Thanks for choosing [facility], we consider it a privelige to serve you.

## 2024-01-10 ENCOUNTER — Ambulatory Visit: Payer: Self-pay | Admitting: Family Medicine

## 2024-01-10 DIAGNOSIS — J40 Bronchitis, not specified as acute or chronic: Secondary | ICD-10-CM | POA: Insufficient documentation

## 2024-01-10 NOTE — Assessment & Plan Note (Signed)
 1 month h/o productive cough with discolored green sputum, CXR and sputum c/s

## 2024-01-10 NOTE — Assessment & Plan Note (Signed)
 Updated lab needed at/ before next visit.

## 2024-01-10 NOTE — Assessment & Plan Note (Signed)

## 2024-01-10 NOTE — Assessment & Plan Note (Signed)
 Needs follow up with pain management pt confused as too med management will refer

## 2024-01-10 NOTE — Assessment & Plan Note (Signed)
 Updated lab needed at/ before next visit. Hyperlipidemia:Low fat diet discussed and encouraged.   Lipid Panel  Lab Results  Component Value Date   CHOL 125 03/22/2023   HDL 42 03/22/2023   LDLCALC 59 03/22/2023   LDLDIRECT 141 (H) 10/31/2007   TRIG 137 03/22/2023   CHOLHDL 3.0 03/22/2023

## 2024-01-10 NOTE — Assessment & Plan Note (Signed)
 Controlled, no change in medication

## 2024-01-11 ENCOUNTER — Telehealth: Payer: Self-pay

## 2024-01-11 LAB — LIPID PANEL
Chol/HDL Ratio: 3.6 ratio (ref 0.0–4.4)
Cholesterol, Total: 180 mg/dL (ref 100–199)
HDL: 50 mg/dL (ref >39)
LDL Chol Calc (NIH): 99 mg/dL (ref 0–99)
Triglycerides: 180 mg/dL — ABNORMAL HIGH (ref 0–149)
VLDL Cholesterol Cal: 31 mg/dL (ref 5–40)

## 2024-01-11 LAB — CMP14+EGFR
ALT: 25 IU/L (ref 0–32)
AST: 34 IU/L (ref 0–40)
Albumin: 4.7 g/dL (ref 3.7–4.7)
Alkaline Phosphatase: 50 IU/L (ref 48–129)
BUN/Creatinine Ratio: 22 (ref 12–28)
BUN: 22 mg/dL (ref 8–27)
Bilirubin Total: 0.6 mg/dL (ref 0.0–1.2)
CO2: 21 mmol/L (ref 20–29)
Calcium: 10.5 mg/dL — ABNORMAL HIGH (ref 8.7–10.3)
Chloride: 100 mmol/L (ref 96–106)
Creatinine, Ser: 0.99 mg/dL (ref 0.57–1.00)
Globulin, Total: 2.9 g/dL (ref 1.5–4.5)
Glucose: 109 mg/dL — ABNORMAL HIGH (ref 70–99)
Potassium: 4.2 mmol/L (ref 3.5–5.2)
Sodium: 142 mmol/L (ref 134–144)
Total Protein: 7.6 g/dL (ref 6.0–8.5)
eGFR: 56 mL/min/1.73 — ABNORMAL LOW (ref >59)

## 2024-01-11 LAB — CBC WITH DIFFERENTIAL/PLATELET
Basophils Absolute: 0 x10E3/uL (ref 0.0–0.2)
Basos: 0 %
EOS (ABSOLUTE): 0.1 x10E3/uL (ref 0.0–0.4)
Eos: 1 %
Hematocrit: 44.3 % (ref 34.0–46.6)
Hemoglobin: 14.5 g/dL (ref 11.1–15.9)
Immature Grans (Abs): 0 x10E3/uL (ref 0.0–0.1)
Immature Granulocytes: 0 %
Lymphocytes Absolute: 3.3 x10E3/uL — ABNORMAL HIGH (ref 0.7–3.1)
Lymphs: 32 %
MCH: 29.3 pg (ref 26.6–33.0)
MCHC: 32.7 g/dL (ref 31.5–35.7)
MCV: 90 fL (ref 79–97)
Monocytes Absolute: 0.9 x10E3/uL (ref 0.1–0.9)
Monocytes: 9 %
Neutrophils Absolute: 6 x10E3/uL (ref 1.4–7.0)
Neutrophils: 58 %
Platelets: 292 x10E3/uL (ref 150–450)
RBC: 4.95 x10E6/uL (ref 3.77–5.28)
RDW: 12.8 % (ref 11.7–15.4)
WBC: 10.4 x10E3/uL (ref 3.4–10.8)

## 2024-01-11 LAB — VITAMIN D 25 HYDROXY (VIT D DEFICIENCY, FRACTURES): Vit D, 25-Hydroxy: 30.1 ng/mL (ref 30.0–100.0)

## 2024-01-11 LAB — HEMOGLOBIN A1C
Est. average glucose Bld gHb Est-mCnc: 131 mg/dL
Hgb A1c MFr Bld: 6.2 % — ABNORMAL HIGH (ref 4.8–5.6)

## 2024-01-11 LAB — TSH: TSH: 1.05 u[IU]/mL (ref 0.450–4.500)

## 2024-01-11 NOTE — Telephone Encounter (Signed)
Still waiting for results

## 2024-01-11 NOTE — Telephone Encounter (Signed)
 Copied from CRM [id number]. Topic: Clinical - Lab/Test Results >> Jan 11, 2024 11:10 AM Antwanette L wrote: Reason for CRM: Lab results relayed. Patient has no additional questions at this time. She is requesting to know if her X-ray results are available. Please follow up the patient at [phone]

## 2024-01-11 NOTE — Telephone Encounter (Signed)
 Copied from CRM #[id number]. Topic: General - Other >> Jan 11, 2024 11:29 AM Antwanette L wrote: Reason for CRM: Please inform Dr. Antonetta that the patient is not producing any sputum from her chest and therefore will not be bringing in a sample.

## 2024-01-23 ENCOUNTER — Encounter: Payer: Self-pay | Admitting: Cardiology

## 2024-01-23 ENCOUNTER — Ambulatory Visit: Attending: Cardiology | Admitting: Cardiology

## 2024-01-23 VITALS — BP 140/70 | HR 81 | Ht 64.0 in | Wt 155.8 lb

## 2024-01-23 DIAGNOSIS — I350 Nonrheumatic aortic (valve) stenosis: Secondary | ICD-10-CM | POA: Diagnosis not present

## 2024-01-23 DIAGNOSIS — I10 Essential (primary) hypertension: Secondary | ICD-10-CM | POA: Diagnosis not present

## 2024-01-23 DIAGNOSIS — E782 Mixed hyperlipidemia: Secondary | ICD-10-CM | POA: Diagnosis not present

## 2024-01-23 NOTE — Patient Instructions (Signed)
 Medication Instructions:   Continue all current medications.   Labwork:  none  Testing/Procedures:  Your physician has requested that you have an echocardiogram. Echocardiography is a painless test that uses sound waves to create images of your heart. It provides your doctor with information about the size and shape of your heart and how well your heart's chambers and valves are working. This procedure takes approximately one hour. There are no restrictions for this procedure. Please do NOT wear cologne, perfume, aftershave, or lotions (deodorant is allowed). Please arrive 15 minutes prior to your appointment time.  Please note: We ask at that you not bring children with you during ultrasound (echo/ vascular) testing. Due to room size and safety concerns, children are not allowed in the ultrasound rooms during exams. Our front office staff cannot provide observation of children in our lobby area while testing is being conducted. An adult accompanying a patient to their appointment will only be allowed in the ultrasound room at the discretion of the ultrasound technician under special circumstances. We apologize for any inconvenience. DUE FEBRUARY 2026  Follow-Up:  6 months   Any Other Special Instructions Will Be Listed Below (If Applicable).  Keep BP log till next office visit - 3 x week   If you need a refill on your cardiac medications before your next appointment, please call your pharmacy.
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
## 2024-01-28 ENCOUNTER — Other Ambulatory Visit (HOSPITAL_COMMUNITY): Payer: Self-pay

## 2024-02-11 ENCOUNTER — Ambulatory Visit: Admitting: Nurse Practitioner

## 2024-03-07 ENCOUNTER — Ambulatory Visit: Payer: Self-pay

## 2024-03-07 NOTE — Telephone Encounter (Signed)
" °  FYI Only or Action Required?: Action required by provider: requesting steroid.  Patient was last seen in primary care on 01/09/2024 by Antonetta Rollene BRAVO, MD.  Called Nurse Triage reporting Cough.  Symptoms began today.  Interventions attempted: Nothing.  Symptoms are: gradually worsening.  Triage Disposition: Home Care  Patient/caregiver understands and will follow disposition?: No, wishes to speak with PCP   Summary: Wrosenign cough, shortness of breath   Reason for Triage: Pt reports worsening cough, with shortness of breath and bronchitis.  Pt requesting medication be called in, pt reports she does not feel well. States provider is aware and will call something in, as she has these flare ups often.         Reason for Disposition  Cough  Answer Assessment - Initial Assessment Questions 1. ONSET: When did the cough begin?      Today early am 2. SEVERITY: How bad is the cough today?      moderate 3. SPUTUM: Describe the color of your sputum (e.g., none, dry cough; clear, white, yellow, green)     yellow 4. HEMOPTYSIS: Are you coughing up any blood? If Yes, ask: How much? (e.g., flecks, streaks, tablespoons, etc.)     no 5. DIFFICULTY BREATHING: Are you having difficulty breathing? If Yes, ask: How bad is it? (e.g., mild, moderate, severe)      denies 6. FEVER: Do you have a fever? If Yes, ask: What is your temperature, how was it measured, and when did it start?     98.9 orally 7. CARDIAC HISTORY: Do you have any history of heart disease? (e.g., heart attack, congestive heart failure)      no 8. LUNG HISTORY: Do you have any history of lung disease?  (e.g., pulmonary embolus, asthma, emphysema)     bronchitis 9. PE RISK FACTORS: Do you have a history of blood clots? (or: recent major surgery, recent prolonged travel, bedridden)     no 10. OTHER SYMPTOMS: Do you have any other symptoms? (e.g., runny nose, wheezing, chest pain)        denies 11. PREGNANCY: Is there any chance you are pregnant? When was your last menstrual period?        12. TRAVEL: Have you traveled out of the country in the last month? (e.g., travel history, exposures)  Protocols used: Cough - Acute Productive-A-AH  "

## 2024-03-08 MED ORDER — BENZONATATE 100 MG PO CAPS: 100.0000 mg | ORAL_CAPSULE | Freq: Two times a day (BID) | ORAL | 0 refills | Status: AC | PRN

## 2024-03-08 MED ORDER — PREDNISONE 5 MG PO TABS: 5.0000 mg | ORAL_TABLET | Freq: Two times a day (BID) | ORAL | 0 refills | Status: AC

## 2024-03-08 NOTE — Telephone Encounter (Signed)
 I have prescribed tessalon  perles and 3 day course of prednisone  low dose. If symptoms  worsen or fail to improve eg fever, chills , increased shortness of breath , fatigue, she needs to go to urgent care or the ED

## 2024-03-10 NOTE — Telephone Encounter (Signed)
 Left detailed vm explaining to patient Dr. Antonetta message

## 2024-03-22 ENCOUNTER — Other Ambulatory Visit: Payer: Self-pay | Admitting: Family Medicine

## 2024-03-25 ENCOUNTER — Ambulatory Visit

## 2024-04-15 ENCOUNTER — Ambulatory Visit

## 2024-06-10 ENCOUNTER — Ambulatory Visit: Admitting: Physician Assistant

## 2024-06-11 ENCOUNTER — Ambulatory Visit: Admitting: Family Medicine

## 2024-07-29 ENCOUNTER — Ambulatory Visit: Admitting: Cardiology

## 2025-01-05 ENCOUNTER — Ambulatory Visit
